# Patient Record
Sex: Male | Born: 1965 | Race: Black or African American | Hispanic: No | Smoking: Former smoker
Health system: Southern US, Community
[De-identification: ages and names within clinical notes are randomized; demographics above are authoritative.]

## PROBLEM LIST (undated history)

## (undated) DIAGNOSIS — R339 Retention of urine, unspecified: Secondary | ICD-10-CM

## (undated) DIAGNOSIS — I10 Essential (primary) hypertension: Secondary | ICD-10-CM

## (undated) DIAGNOSIS — I639 Cerebral infarction, unspecified: Secondary | ICD-10-CM

## (undated) DIAGNOSIS — D75839 Thrombocytosis, unspecified: Secondary | ICD-10-CM

## (undated) HISTORY — DX: Retention of urine, unspecified: R33.9

## (undated) HISTORY — DX: Thrombocytosis, unspecified: D75.839

## (undated) HISTORY — DX: Essential (primary) hypertension: I10

## (undated) NOTE — *Deleted (*Deleted)
Patient on enteric precautions at beginning of shift noted for a fever of 103.1 F and vital signs elevated to point of becoming a yellow mews at 1932, yellow mews protocol followed Charge nurse sandra notified at 1956 Dr. Carlis Abbott notified at 2002 and new orders received to start IV vancomycin and Zosyn. On next vitals sign check at 2113 vital signs elevated to red mews criteria.

---

## 2000-12-29 ENCOUNTER — Emergency Department (HOSPITAL_COMMUNITY): Admission: EM | Admit: 2000-12-29 | Discharge: 2000-12-29 | Payer: Self-pay | Admitting: *Deleted

## 2001-01-01 ENCOUNTER — Encounter (HOSPITAL_COMMUNITY): Admission: RE | Admit: 2001-01-01 | Discharge: 2001-04-01 | Payer: Self-pay | Admitting: *Deleted

## 2001-01-05 ENCOUNTER — Emergency Department (HOSPITAL_COMMUNITY): Admission: EM | Admit: 2001-01-05 | Discharge: 2001-01-05 | Payer: Self-pay | Admitting: Emergency Medicine

## 2001-01-07 ENCOUNTER — Emergency Department (HOSPITAL_COMMUNITY): Admission: EM | Admit: 2001-01-07 | Discharge: 2001-01-07 | Payer: Self-pay | Admitting: Emergency Medicine

## 2001-01-12 ENCOUNTER — Emergency Department (HOSPITAL_COMMUNITY): Admission: EM | Admit: 2001-01-12 | Discharge: 2001-01-12 | Payer: Self-pay | Admitting: Emergency Medicine

## 2001-01-26 ENCOUNTER — Emergency Department (HOSPITAL_COMMUNITY): Admission: EM | Admit: 2001-01-26 | Discharge: 2001-01-26 | Payer: Self-pay | Admitting: Emergency Medicine

## 2012-06-07 ENCOUNTER — Ambulatory Visit: Payer: Self-pay | Admitting: Emergency Medicine

## 2012-06-07 VITALS — BP 159/94 | HR 71 | Temp 98.0°F | Resp 16 | Ht 67.25 in | Wt 199.6 lb

## 2012-06-07 DIAGNOSIS — S91109A Unspecified open wound of unspecified toe(s) without damage to nail, initial encounter: Secondary | ICD-10-CM

## 2012-06-07 DIAGNOSIS — S91119A Laceration without foreign body of unspecified toe without damage to nail, initial encounter: Secondary | ICD-10-CM

## 2012-06-07 MED ORDER — CEPHALEXIN 500 MG PO CAPS: 500.0000 mg | ORAL_CAPSULE | Freq: Four times a day (QID) | ORAL | Status: AC

## 2012-06-07 NOTE — Progress Notes (Signed)
VCO. Digital block with 2% lidocaine plain. Wound washed thoroughly with soap and water. No deep structure involvement, no foreign bodies or debris noted. Dressed and bandaged. Patient tolerated procedure well. He declined post-op shoe at this time.

## 2012-06-07 NOTE — Progress Notes (Signed)
   Nature conservation officer at [facility] [address] [place] [address] Phone: [phone] Fax: [phone]   Patient Name: Zachary Flores Date of Birth: 1966/11/02 Medical Record Number: [id number] Gender: male Date of Encounter: 06/07/2012  History of Present Illness:  Zachary Flores is a 46 y.o. very pleasant male patient who presents with the following:  Stepped on a rake on Wednesday and tines lacerated his right great toe.  Current on TD   There is no problem list on file for this patient.  No past medical history on file. No past surgical history on file. History  Substance Use Topics  . Smoking status: Current Everyday Smoker  . Smokeless tobacco: Not on file  . Alcohol Use: Not on file   No family history on file. No Known Allergies  Medication list has been reviewed and updated.  Prior to Admission medications   Not on File    Review of Systems:  As per HPI, otherwise negative.   Physical Examination: Filed Vitals:   06/07/12 1139  BP: 159/94  Pulse: 71  Temp: 98 F (36.7 C)  Resp: 16   Filed Vitals:   06/07/12 1139  Height: 5' 7.25" (1.708 m)  Weight: 199 lb 9.6 oz (90.538 kg)   Body mass index is 31.03 kg/(m^2). Ideal Body Weight: Weight in (lb) to have BMI = 25: 160.5   GEN: WDWN, NAD, Non-toxic, Alert & Oriented x 3 HEENT: Atraumatic, Normocephalic.  Ears and Nose: No external deformity. EXTR: No clubbing/cyanosis/edema.  Laceration flexor surface right great toe.  No FB  Wound clean.Marland Kitchen NATI NEURO: Normal gait.  PSYCH: Normally interactive. Conversant. Not depressed or anxious appearing.  Calm demeanor.    Assessment and Plan:  Laceration great toe.   Wound care in office included local anesthesia and forceful irrigation.  Plan to repair tomorrow if no evidence infection.   Keflex for prophylaxis  Carmelina Dane, MD

## 2012-06-07 NOTE — Patient Instructions (Signed)

## 2012-06-08 ENCOUNTER — Ambulatory Visit (INDEPENDENT_AMBULATORY_CARE_PROVIDER_SITE_OTHER): Payer: Self-pay | Admitting: Physician Assistant

## 2012-06-08 VITALS — BP 143/102 | HR 67 | Temp 98.6°F | Resp 16 | Ht 67.0 in | Wt 199.0 lb

## 2012-06-08 DIAGNOSIS — S91309A Unspecified open wound, unspecified foot, initial encounter: Secondary | ICD-10-CM

## 2012-06-08 DIAGNOSIS — M79609 Pain in unspecified limb: Secondary | ICD-10-CM

## 2012-06-08 DIAGNOSIS — M79673 Pain in unspecified foot: Secondary | ICD-10-CM

## 2012-06-08 DIAGNOSIS — B353 Tinea pedis: Secondary | ICD-10-CM

## 2012-06-08 MED ORDER — KETOCONAZOLE 2 % EX CREA: TOPICAL_CREAM | Freq: Every day | CUTANEOUS | Status: AC

## 2012-06-08 NOTE — Progress Notes (Signed)
  Subjective:    Patient ID: Zachary Flores, male    DOB: [birth year], 46 y.o.   MRN: [id number]  HPI Patient presents for follow up on right great toe wound. DOI 06/05/12 after stepping on a rake. The rake went through his shoe and lacerated the plantar aspect of his great toe. He presented initially for evaluation 6/14 and his wound was digitally blocked, irrigated, and washed with soap and water. He is here today for recheck. He has kept it clean and bandaged. Washed with soap and water this a.m. No purulent drainage, warmth, fever, chills, or tenderness. In fact, he says the pain has improved today.  Patient also complains of persistent, pruritic rash on the plantar surface of his right foot.   Review of Systems  All other systems reviewed and are negative.       Objective:   Physical Exam  Constitutional: He is oriented to person, place, and time. He appears well-developed and well-nourished.  HENT:  Head: Normocephalic and atraumatic.  Right Ear: External ear normal.  Left Ear: External ear normal.  Eyes: Conjunctivae are normal.  Neck: Normal range of motion.  Cardiovascular: Normal rate, regular rhythm and normal heart sounds.   Pulmonary/Chest: Effort normal and breath sounds normal.  Neurological: He is alert and oriented to person, place, and time.  Skin: Rash (scaly rash on plantar surface with yellowing and thickening of nails) noted.       Open wound of plantar aspect of right great toe. No purulent drainage, warmth, or tenderness.     Psychiatric: He has a normal mood and affect. His behavior is normal. Judgment and thought content normal.    Two steri strips were applied to the lateral sides of the wound to help approximate the edges.       Assessment & Plan:   1. Wound, open, foot  Continue Keflex as directed. Wound appears to be healing well. Wound care provided. Recommend he continue daily cleaning and keeping a bulky bandage at all times.    2. Tinea pedis   ketoconazole (NIZORAL) 2 % cream  3. Pain in foot

## 2019-06-30 ENCOUNTER — Other Ambulatory Visit: Payer: Self-pay

## 2019-06-30 ENCOUNTER — Emergency Department (HOSPITAL_COMMUNITY): Payer: Self-pay

## 2019-06-30 ENCOUNTER — Encounter (HOSPITAL_COMMUNITY): Payer: Self-pay | Admitting: Emergency Medicine

## 2019-06-30 ENCOUNTER — Observation Stay (HOSPITAL_COMMUNITY)
Admission: EM | Admit: 2019-06-30 | Discharge: 2019-07-01 | Disposition: A | Discharge status: Home / Self Care | Payer: Self-pay | Attending: Internal Medicine | Admitting: Internal Medicine

## 2019-06-30 DIAGNOSIS — F10129 Alcohol abuse with intoxication, unspecified: Secondary | ICD-10-CM | POA: Insufficient documentation

## 2019-06-30 DIAGNOSIS — Z03818 Encounter for observation for suspected exposure to other biological agents ruled out: Secondary | ICD-10-CM | POA: Insufficient documentation

## 2019-06-30 DIAGNOSIS — R740 Nonspecific elevation of levels of transaminase and lactic acid dehydrogenase [LDH]: Secondary | ICD-10-CM | POA: Insufficient documentation

## 2019-06-30 DIAGNOSIS — E876 Hypokalemia: Secondary | ICD-10-CM | POA: Insufficient documentation

## 2019-06-30 DIAGNOSIS — R7989 Other specified abnormal findings of blood chemistry: Secondary | ICD-10-CM

## 2019-06-30 DIAGNOSIS — F10929 Alcohol use, unspecified with intoxication, unspecified: Secondary | ICD-10-CM

## 2019-06-30 DIAGNOSIS — M25569 Pain in unspecified knee: Secondary | ICD-10-CM

## 2019-06-30 DIAGNOSIS — I10 Essential (primary) hypertension: Secondary | ICD-10-CM | POA: Insufficient documentation

## 2019-06-30 DIAGNOSIS — Z87898 Personal history of other specified conditions: Secondary | ICD-10-CM | POA: Diagnosis present

## 2019-06-30 DIAGNOSIS — G459 Transient cerebral ischemic attack, unspecified: Secondary | ICD-10-CM | POA: Insufficient documentation

## 2019-06-30 DIAGNOSIS — F17200 Nicotine dependence, unspecified, uncomplicated: Secondary | ICD-10-CM | POA: Insufficient documentation

## 2019-06-30 DIAGNOSIS — W010XXA Fall on same level from slipping, tripping and stumbling without subsequent striking against object, initial encounter: Secondary | ICD-10-CM | POA: Insufficient documentation

## 2019-06-30 DIAGNOSIS — R531 Weakness: Principal | ICD-10-CM | POA: Insufficient documentation

## 2019-06-30 DIAGNOSIS — R945 Abnormal results of liver function studies: Secondary | ICD-10-CM

## 2019-06-30 HISTORY — DX: Other specified abnormal findings of blood chemistry: R79.89

## 2019-06-30 LAB — APTT: aPTT: 26 seconds (ref 24–36)

## 2019-06-30 LAB — COMPREHENSIVE METABOLIC PANEL
ALT: 48 U/L — ABNORMAL HIGH (ref 0–44)
AST: 87 U/L — ABNORMAL HIGH (ref 15–41)
Albumin: 4.4 g/dL (ref 3.5–5.0)
Alkaline Phosphatase: 79 U/L (ref 38–126)
Anion gap: 14 (ref 5–15)
BUN: 5 mg/dL — ABNORMAL LOW (ref 6–20)
CO2: 20 mmol/L — ABNORMAL LOW (ref 22–32)
Calcium: 8.7 mg/dL — ABNORMAL LOW (ref 8.9–10.3)
Chloride: 99 mmol/L (ref 98–111)
Creatinine, Ser: 0.92 mg/dL (ref 0.61–1.24)
GFR calc Af Amer: >60 mL/min (ref >60)
GFR calc non Af Amer: >60 mL/min (ref >60)
Glucose, Bld: 122 mg/dL — ABNORMAL HIGH (ref 70–99)
Potassium: 3.4 mmol/L — ABNORMAL LOW (ref 3.5–5.1)
Sodium: 133 mmol/L — ABNORMAL LOW (ref 135–145)
Total Bilirubin: 0.5 mg/dL (ref 0.3–1.2)
Total Protein: 7.1 g/dL (ref 6.5–8.1)

## 2019-06-30 LAB — CBC
HCT: 44.8 % (ref 39.0–52.0)
Hemoglobin: 14.8 g/dL (ref 13.0–17.0)
MCH: 29.7 pg (ref 26.0–34.0)
MCHC: 33 g/dL (ref 30.0–36.0)
MCV: 89.8 fL (ref 80.0–100.0)
Platelets: 313 10*3/uL (ref 150–400)
RBC: 4.99 MIL/uL (ref 4.22–5.81)
RDW: 13.9 % (ref 11.5–15.5)
WBC: 5.7 10*3/uL (ref 4.0–10.5)
nRBC: 0 % (ref 0.0–0.2)

## 2019-06-30 LAB — DIFFERENTIAL
Abs Immature Granulocytes: 0.01 10*3/uL (ref 0.00–0.07)
Basophils Absolute: 0 10*3/uL (ref 0.0–0.1)
Basophils Relative: 1 %
Eosinophils Absolute: 0 10*3/uL (ref 0.0–0.5)
Eosinophils Relative: 1 %
Immature Granulocytes: 0 %
Lymphocytes Relative: 24 %
Lymphs Abs: 1.4 10*3/uL (ref 0.7–4.0)
Monocytes Absolute: 0.5 10*3/uL (ref 0.1–1.0)
Monocytes Relative: 9 %
Neutro Abs: 3.8 10*3/uL (ref 1.7–7.7)
Neutrophils Relative %: 65 %

## 2019-06-30 LAB — I-STAT CHEM 8, ED
BUN: 4 mg/dL — ABNORMAL LOW (ref 6–20)
Calcium, Ion: 0.96 mmol/L — ABNORMAL LOW (ref 1.15–1.40)
Chloride: 101 mmol/L (ref 98–111)
Creatinine, Ser: 1.4 mg/dL — ABNORMAL HIGH (ref 0.61–1.24)
Glucose, Bld: 120 mg/dL — ABNORMAL HIGH (ref 70–99)
HCT: 49 % (ref 39.0–52.0)
Hemoglobin: 16.7 g/dL (ref 13.0–17.0)
Potassium: 3.5 mmol/L (ref 3.5–5.1)
Sodium: 135 mmol/L (ref 135–145)
TCO2: 21 mmol/L — ABNORMAL LOW (ref 22–32)

## 2019-06-30 LAB — ETHANOL: Alcohol, Ethyl (B): 281 mg/dL — ABNORMAL HIGH (ref <10)

## 2019-06-30 LAB — PROTIME-INR
INR: 0.9 (ref 0.8–1.2)
Prothrombin Time: 12.5 seconds (ref 11.4–15.2)

## 2019-06-30 LAB — SARS CORONAVIRUS 2 BY RT PCR (HOSPITAL ORDER, PERFORMED IN ~~LOC~~ HOSPITAL LAB): SARS Coronavirus 2: NEGATIVE

## 2019-06-30 MED ORDER — LORAZEPAM 1 MG PO TABS: 1.0000 mg | ORAL_TABLET | Freq: Four times a day (QID) | ORAL | Status: DC | PRN

## 2019-06-30 MED ORDER — SODIUM CHLORIDE 0.9% FLUSH
3.0000 mL | Freq: Once | INTRAVENOUS | Status: AC
  Administered 2019-06-30: 3 mL via INTRAVENOUS

## 2019-06-30 MED ORDER — THIAMINE HCL 100 MG/ML IJ SOLN: 100.0000 mg | Freq: Every day | INTRAMUSCULAR | Status: DC

## 2019-06-30 MED ORDER — POTASSIUM CHLORIDE 10 MEQ/100ML IV SOLN
10.0000 meq | Freq: Once | INTRAVENOUS | Status: AC
  Administered 2019-06-30: 10 meq via INTRAVENOUS
  Filled 2019-06-30: qty 100

## 2019-06-30 MED ORDER — LORAZEPAM 2 MG/ML IJ SOLN: 1.0000 mg | Freq: Four times a day (QID) | INTRAMUSCULAR | Status: DC | PRN

## 2019-06-30 MED ORDER — FOLIC ACID 1 MG PO TABS
1.0000 mg | ORAL_TABLET | Freq: Every day | ORAL | Status: DC
  Administered 2019-07-01: 1 mg via ORAL
  Filled 2019-06-30: qty 1

## 2019-06-30 MED ORDER — IOHEXOL 350 MG/ML SOLN
75.0000 mL | Freq: Once | INTRAVENOUS | Status: AC | PRN
  Administered 2019-06-30: 75 mL via INTRAVENOUS

## 2019-06-30 MED ORDER — VITAMIN B-1 100 MG PO TABS
100.0000 mg | ORAL_TABLET | Freq: Every day | ORAL | Status: DC
  Administered 2019-07-01: 100 mg via ORAL
  Filled 2019-06-30: qty 1

## 2019-06-30 MED ORDER — ADULT MULTIVITAMIN W/MINERALS CH
1.0000 | ORAL_TABLET | Freq: Every day | ORAL | Status: DC
  Administered 2019-07-01: 1 via ORAL
  Filled 2019-06-30: qty 1

## 2019-06-30 MED ORDER — SODIUM CHLORIDE 0.9 % IV SOLN
INTRAVENOUS | Status: DC
  Administered 2019-06-30: via INTRAVENOUS

## 2019-06-30 NOTE — ED Notes (Signed)
Pt repeatedly trying to get out of bed by self. Pt weak in legs and repeatedly asked to stay in bed and not get up by self. RN moving pt closer to [employer]. Safety sitter to be ordered. Yellow socks and wrist band placed.

## 2019-06-30 NOTE — ED Notes (Addendum)
Pt pulled out one IV says that he's leaving RN again educating.

## 2019-06-30 NOTE — Consult Note (Signed)
Requesting Physician: Dr. Tomi Bamberger    Chief Complaint:   History obtained from: Patient and Chart   HPI:                                                                                                                                       Zachary Flores is a 53 y.o. male with  past medical history of tobacco use, possible alcohol abuse presents to the ED as a stroke alert for left-sided weakness after a fall.  Patient started drinking alcohol around 11 AM this afternoon.  Around 7 PM in the evening patient had walked out of the house and fallen.  Neighbors called EMS and patient was found intoxicated and  was taken back into the house.  Assessment at that time did not reveal any facial droop or focal motor deficits according to the EMS.  Around 7:30 PM EMS was called again by his neighbors as patient had fallen on the floor again.  This time EMS noted his left leg was significantly weak and patient was not able to lift it against gravity.  He also had a left facial droop and left arm drift.  On arrival, patient scored a NIH stroke scale of 5.  However he was continuing to improve.  A stat CT head was obtained which showed no hemorrhage.  Following CT scan, NIH stroke scale further reduced to 4 now scoring only a drift in the left leg.    Since last known normal was unclear given patient was intoxicated, and it is possible that he may have mild deficits a stat MRI brain was obtained to assess for diffusion/flair mismatch.  MRI was negative for acute infarct, repeat assessment after completing MRI brain was negative for facial droop, arm and leg drift.  Date last known well: 7.6.2020 Time last known well: ?7pm vs 11am tPA Given: no, symptoms resolving NIHSS: 5 on arrival Baseline MRS 0  Past medical history As stated above  Family history No significant stroke history at young age  Social History:  reports that he has been smoking. He does not have any smokeless tobacco history on file. No  history on file for alcohol and drug.  Allergies: No Known Allergies  Medications:  I reviewed home medications   ROS:                                                                                                                                     14 systems reviewed and negative except above    Examination:                                                                                                      General: Appears well-developed  Psych: Affect appropriate to situation Eyes: No scleral injection HENT: No OP obstrucion Head: Normocephalic.  Cardiovascular: Normal rate and regular rhythm. Respiratory: Effort normal and breath sounds normal to anterior ascultation GI: Soft.  No distension. There is no tenderness.  Skin: WDI    Neurological Examination ( on arival) Mental Status: Alert, oriented, thought content appropriate.  Speech fluent without evidence of aphasia.  Speech is slurred.  Able to follow 3 step commands without difficulty. Cranial Nerves: II: Visual fields grossly normal,  III,IV, VI: ptosis not present, extra-ocular motions intact bilaterally, pupils equal, round, reactive to light and accommodation V,VII: Normal sensation over face, mild left nasolabial fold flattening VIII: hearing normal bilaterally IX,X: uvula rises symmetrically XI: bilateral shoulder shrug XII: midline tongue extension Motor: Right : Upper extremity   5/5    Left:     Upper extremity   4/5  Lower extremity   5/5     Lower extremity   3/5 Tone and bulk:normal tone throughout; no atrophy noted Sensory: Pinprick and light touch intact throughout, bilaterally Deep Tendon Reflexes: 2+ and symmetric throughout Plantars: Right: downgoing   Left: downgoing Cerebellar: No significant ataxia out of proportion to weakness Gait: not assessed due to safety     Lab  Results: Basic Metabolic Panel: Recent Labs  Lab 06/30/19 2028 06/30/19 2035  NA 133* 135  K 3.4* 3.5  CL 99 101  CO2 20*  --   GLUCOSE 122* 120*  BUN 5* 4*  CREATININE 0.92 1.40*  CALCIUM 8.7*  --     CBC: Recent Labs  Lab 06/30/19 2028 06/30/19 2035  WBC 5.7  --   NEUTROABS 3.8  --   HGB 14.8 16.7  HCT 44.8 49.0  MCV 89.8  --   PLT 313  --     Coagulation Studies: Recent Labs    06/30/19 2028  LABPROT 12.5  INR 0.9    Imaging: Ct Angio Head W Or Wo Contrast  Result Date: 06/30/2019 CLINICAL DATA:  Stroke syndrome EXAM: CT ANGIOGRAPHY HEAD AND NECK TECHNIQUE: Multidetector CT imaging of the head and neck was performed using the standard protocol during bolus administration of intravenous contrast. Multiplanar CT image reconstructions and MIPs were obtained to evaluate the vascular anatomy. Carotid stenosis measurements (when applicable) are obtained utilizing NASCET criteria, using the distal internal carotid diameter as the denominator. CONTRAST:  75 cc Omnipaque 350 intravenous COMPARISON:  Noncontrast head CT earlier today FINDINGS: CTA NECK FINDINGS Aortic arch: Negative Right carotid system: Vessels are smooth and widely patent. No atheromatous changes Left carotid system: Mild mainly calcified plaque about the bifurcation without stenosis or ulceration. Vertebral arteries: Left dominant vertebral artery. Both vertebral arteries are smooth and diffusely patent. No proximal subclavian stenosis. Skeleton: No acute or aggressive finding.  Dental caries Other neck: Negative Upper chest: Remote injury to the left pectoralis major with partially visualized coarse calcification. Review of the MIP images confirms the above findings CTA HEAD FINDINGS Anterior circulation: Mild calcification of the carotid siphons. No flow limiting stenosis or major branch occlusion. Negative for aneurysm Posterior circulation: Left dominant vertebral artery. The vertebral and basilar arteries are  smooth and widely patent. Hypoplastic P1 segments Venous sinuses: Patent Anatomic variants: As above Delayed phase: Not obtained Review of the MIP images confirms the above findings IMPRESSION: 1. Negative for large vessel occlusion or other emergent finding. 2. Mild atherosclerosis without significant stenosis. Electronically Signed   By: Marnee SpringJonathon  Watts M.D.   On: 06/30/2019 21:02   Ct Angio Neck W Or Wo Contrast  Result Date: 06/30/2019 CLINICAL DATA:  Stroke syndrome EXAM: CT ANGIOGRAPHY HEAD AND NECK TECHNIQUE: Multidetector CT imaging of the head and neck was performed using the standard protocol during bolus administration of intravenous contrast. Multiplanar CT image reconstructions and MIPs were obtained to evaluate the vascular anatomy. Carotid stenosis measurements (when applicable) are obtained utilizing NASCET criteria, using the distal internal carotid diameter as the denominator. CONTRAST:  75 cc Omnipaque 350 intravenous COMPARISON:  Noncontrast head CT earlier today FINDINGS: CTA NECK FINDINGS Aortic arch: Negative Right carotid system: Vessels are smooth and widely patent. No atheromatous changes Left carotid system: Mild mainly calcified plaque about the bifurcation without stenosis or ulceration. Vertebral arteries: Left dominant vertebral artery. Both vertebral arteries are smooth and diffusely patent. No proximal subclavian stenosis. Skeleton: No acute or aggressive finding.  Dental caries Other neck: Negative Upper chest: Remote injury to the left pectoralis major with partially visualized coarse calcification. Review of the MIP images confirms the above findings CTA HEAD FINDINGS Anterior circulation: Mild calcification of the carotid siphons. No flow limiting stenosis or major branch occlusion. Negative for aneurysm Posterior circulation: Left dominant vertebral artery. The vertebral and basilar arteries are smooth and widely patent. Hypoplastic P1 segments Venous sinuses: Patent Anatomic  variants: As above Delayed phase: Not obtained Review of the MIP images confirms the above findings IMPRESSION: 1. Negative for large vessel occlusion or other emergent finding. 2. Mild atherosclerosis without significant stenosis. Electronically Signed   By: Marnee SpringJonathon  Watts M.D.   On: 06/30/2019 21:02   Mr Brain Wo Contrast  Result Date: 06/30/2019 CLINICAL DATA:  Initial evaluation for acute stroke. EXAM: MRI HEAD WITHOUT CONTRAST TECHNIQUE: Multiplanar, multiecho pulse sequences of the brain and surrounding structures were obtained without intravenous contrast. COMPARISON:  Prior CTA from earlier same day. FINDINGS: Brain: Limited exam with DWI, gradient echo imaging, and FLAIR sequence only was performed. Images are degraded by motion artifact. Diffusion-weighted imaging demonstrates no evidence for acute or  subacute ischemia. Gray-white matter differentiation maintained. No evidence for acute intracranial hemorrhage. Few scattered chronic micro hemorrhages noted involving the brainstem. Underlying age-related cerebral atrophy. Confluent T2/FLAIR hyperintensity within the periventricular and deep white matter both cerebral hemispheres most consistent with chronic microvascular ischemic disease, advanced for age. No mass lesion or midline shift. No hydrocephalus. No extra-axial fluid collection. Vascular: Not well assessed on this limited examination. Skull and upper cervical spine: Not well assessed on this limited examination. Sinuses/Orbits: Grossly negative, although not well assessed on this limited exam. Other: None. IMPRESSION: 1. Negative brain MRI.  No acute intracranial infarct identified. 2. 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 Electronically Signed   By: Rise MuBenjamin  McClintock M.D.   On: 06/30/2019 21:27   Ct Head Code Stroke Wo Contrast  Result Date: 06/30/2019 CLINICAL DATA:  Code stroke.  Left facial droop and weakness. EXAM: CT HEAD WITHOUT CONTRAST TECHNIQUE: Contiguous  axial images were obtained from the base of the skull through the vertex without intravenous contrast. COMPARISON:  None. FINDINGS: Brain: No evidence of acute infarction, hemorrhage, hydrocephalus, extra-axial collection or mass lesion/mass effect. Extensive chronic small vessel ischemic gliosis for age in the white matter. Remote appearing lacunar infarct at the upper right putamen and internal capsule. Vascular: No hyperdense vessel or unexpected calcification. Skull: Normal. Negative for fracture or focal lesion. Sinuses/Orbits: No acute finding. Other: These results were communicated to Dr. Laurence SlateAroor at 8:45 pmon 7/6/2020by text page via the [facility] messaging system. ASPECTS [facility]([place] Stroke Program Early CT Score) - Ganglionic level infarction (caudate, lentiform nuclei, internal capsule, insula, M1-M3 cortex): 7 - Supraganglionic infarction (M4-M6 cortex): 3 Total score (0-10 with 10 being normal): 10 IMPRESSION: 1. Negative for intracranial hemorrhage or visible acute infarct. 2. Chronic small vessel ischemia greater than expected for age. Electronically Signed   By: Marnee SpringJonathon  Watts M.D.   On: 06/30/2019 20:47     ASSESSMENT AND PLAN  53 year old male with past medical history significant for tobacco use and possible alcohol abuse presents with transient left-sided weakness after fall.  MRI negative for acute stroke.  CTA negative for large vessel occlusion.  Patient likely had a transient ischemic attack.  Transient ischemic attack  Recommend #Transthoracic Echo  # Start patient on ASA 325mg  daily #Start or continue Atorvastatin 80 mg/other high intensity statin # BP goal: permissive HTN upto 220/120 mmHg ( 185/110 if patient has CHF, CKD) # HBAIC and Lipid profile # Telemetry monitoring # Frequent neuro checks # NPO until passes stroke swallow screen  Please page stroke NP  Or  PA  Or MD from 8am -4 pm  as this patient from this time will be  followed by the stroke.   You can look them up on  [email or web address]  Password [email or web address]   Kade Demicco [facility] Pager Number [phone]

## 2019-06-30 NOTE — ED Notes (Signed)
EMT sitting with pt. Pt resting with eyes closed.

## 2019-06-30 NOTE — ED Notes (Signed)
Pt getting out of bed again by self and cannot hold self up. Pt found on floor. RN called staffing for sitter already. None available.

## 2019-06-30 NOTE — ED Notes (Signed)
Pt being uncooperative about staying in bed. RN attempted to place condom catheter to decrease movement . Pt refused. Pt continually trying to get out of bed to use bathroom. RN continually educating.

## 2019-06-30 NOTE — ED Triage Notes (Signed)
Pt presents to ED from home by [facility]. LKW 1915. Pt seen by neighbor falling. EMS noted L weakness, droop.

## 2019-06-30 NOTE — ED Notes (Addendum)
RN attempted calling report. Told needed to call back

## 2019-06-30 NOTE — ED Notes (Signed)
ED TO INPATIENT HANDOFF REPORT  ED Nurse Name and Phone #:  [phone] Shawna OrleansMelanie, RN  S Name/Age/Gender Zachary Flores 53 y.o. male Room/Bed: 034C/034C  Code Status   Code Status: Not on file  Home/SNF/Other Home Patient oriented to: self, place, time and situation Is this baseline? Yes   Triage Complete: Triage complete  Chief Complaint CODE STROKE   Triage Note Pt presents to ED from home by [facility]. LKW 1915. Pt seen by neighbor falling. EMS noted L weakness, droop.    Allergies No Known Allergies  Level of Care/Admitting Diagnosis ED Disposition    ED Disposition Condition Comment   Admit  [facility] [[id number]]  Level of Care: Telemetry Medical [104]  I expect the patient will be discharged within 24 hours: No (not a candidate for 5C-Observation unit)  Covid Evaluation: Person Under Investigation (PUI)  Diagnosis: TIA (transient ischemic attack) [[id number]]) [167614]  Admitting Physician: Eduard ClosKAKRAKANDY, ARSHAD N [phone][[id number]]  Attending Physician: Eduard ClosKAKRAKANDY, ARSHAD N [[id number]]  PT Class (Do Not Modify): Observation [104]  PT Acc Code (Do Not Modify): Observation [10022]       B Medical/Surgery History History reviewed. No pertinent past medical history. History reviewed. No pertinent surgical history.   A IV Location/Drains/Wounds Patient Lines/Drains/Airways Status   Active Line/Drains/Airways    Name:   Placement date:   Placement time:   Site:   Days:   Peripheral IV 06/30/19 Anterior;Distal;Right;Upper Arm   06/30/19    2027    Arm   less than 1          Intake/Output Last 24 hours  Intake/Output Summary (Last 24 hours) at 06/30/2019 2345 Last data filed at 06/30/2019 2100 Gross per 24 hour  Intake -  Output 250 ml  Net -250 ml    Labs/Imaging Results for orders placed or performed during the hospital encounter of 06/30/19 (from the past 48 hour(s))  Protime-INR     Status: None   Collection Time: 06/30/19  8:28 PM  Result Value Ref Range    Prothrombin Time 12.5 11.4 - 15.2 seconds   INR 0.9 0.8 - 1.2    Comment: (NOTE) INR goal varies based on device and disease states. Performed at [facility], [address] [place], [place] [address]   APTT     Status: None   Collection Time: 06/30/19  8:28 PM  Result Value Ref Range   aPTT 26 24 - 36 seconds    Comment: Performed at [facility], [address] [place], [place] [address]  CBC     Status: None   Collection Time: 06/30/19  8:28 PM  Result Value Ref Range   WBC 5.7 4.0 - 10.5 K/uL   RBC 4.99 4.22 - 5.81 MIL/uL   Hemoglobin 14.8 13.0 - 17.0 g/dL   HCT 08.644.8 57.839.0 - 46.952.0 %   MCV 89.8 80.0 - 100.0 fL   MCH 29.7 26.0 - 34.0 pg   MCHC 33.0 30.0 - 36.0 g/dL   RDW 62.913.9 52.811.5 - 41.315.5 %   Platelets 313 150 - 400 K/uL   nRBC 0.0 0.0 - 0.2 %    Comment: Performed at [facility], [address] [place], [place] [address]  Differential     Status: None   Collection Time: 06/30/19  8:28 PM  Result Value Ref Range   Neutrophils Relative % 65 %   Neutro Abs 3.8 1.7 - 7.7 K/uL   Lymphocytes Relative 24 %  Lymphs Abs 1.4 0.7 - 4.0 K/uL   Monocytes Relative 9 %   Monocytes Absolute 0.5 0.1 - 1.0 K/uL   Eosinophils Relative 1 %   Eosinophils Absolute 0.0 0.0 - 0.5 K/uL   Basophils Relative 1 %   Basophils Absolute 0.0 0.0 - 0.1 K/uL   Immature Granulocytes 0 %   Abs Immature Granulocytes 0.01 0.00 - 0.07 K/uL    Comment: Performed at [facility], [address] [place], [place] [address]  Comprehensive metabolic panel     Status: Abnormal   Collection Time: 06/30/19  8:28 PM  Result Value Ref Range   Sodium 133 (L) 135 - 145 mmol/L   Potassium 3.4 (L) 3.5 - 5.1 mmol/L   Chloride 99 98 - 111 mmol/L   CO2 20 (L) 22 - 32 mmol/L   Glucose, Bld 122 (H) 70 - 99 mg/dL   BUN 5 (L) 6 - 20 mg/dL   Creatinine, Ser 9.81 0.61 - 1.24 mg/dL   Calcium 8.7 (L) 8.9 - 10.3 mg/dL   Total Protein 7.1 6.5 - 8.1 g/dL   Albumin 4.4 3.5 - 5.0 g/dL   AST 87 (H)  15 - 41 U/L   ALT 48 (H) 0 - 44 U/L   Alkaline Phosphatase 79 38 - 126 U/L   Total Bilirubin 0.5 0.3 - 1.2 mg/dL   GFR calc non Af Amer >60 >60 mL/min   GFR calc Af Amer >60 >60 mL/min   Anion gap 14 5 - 15    Comment: Performed at [facility], [address], [place], [place] [address]  I-stat chem 8, ED     Status: Abnormal   Collection Time: 06/30/19  8:35 PM  Result Value Ref Range   Sodium 135 135 - 145 mmol/L   Potassium 3.5 3.5 - 5.1 mmol/L   Chloride 101 98 - 111 mmol/L   BUN 4 (L) 6 - 20 mg/dL   Creatinine, Ser 8.29 (H) 0.61 - 1.24 mg/dL   Glucose, Bld 562 (H) 70 - 99 mg/dL   Calcium, Ion 1.30 (L) 1.15 - 1.40 mmol/L   TCO2 21 (L) 22 - 32 mmol/L   Hemoglobin 16.7 13.0 - 17.0 g/dL   HCT 86.5 78.4 - 69.6 %  SARS Coronavirus 2 (CEPHEID - Performed in [facility] hospital lab), Hosp Order     Status: None   Collection Time: 06/30/19  9:42 PM   Specimen: Nasopharyngeal Swab  Result Value Ref Range   SARS Coronavirus 2 NEGATIVE NEGATIVE    Comment: (NOTE) If result is NEGATIVE SARS-CoV-2 target nucleic acids are NOT DETECTED. The SARS-CoV-2 RNA is generally detectable in upper and lower  respiratory specimens during the acute phase of infection. The lowest  concentration of SARS-CoV-2 viral copies this assay can detect is 250  copies / mL. A negative result does not preclude SARS-CoV-2 infection  and should not be used as the sole basis for treatment or other  patient management decisions.  A negative result may occur with  improper specimen collection / handling, submission of specimen other  than nasopharyngeal swab, presence of viral mutation(s) within the  areas targeted by this assay, and inadequate number of viral copies  (<250 copies / mL). A negative result must be combined with clinical  observations, patient history, and epidemiological information. If result is POSITIVE SARS-CoV-2 target nucleic acids are DETECTED. The SARS-CoV-2 RNA is generally  detectable in upper and lower  respiratory specimens dur ing the  acute phase of infection.  Positive  results are indicative of active infection with SARS-CoV-2.  Clinical  correlation with patient history and other diagnostic information is  necessary to determine patient infection status.  Positive results do  not rule out bacterial infection or co-infection with other viruses. If result is PRESUMPTIVE POSTIVE SARS-CoV-2 nucleic acids MAY BE PRESENT.   A presumptive positive result was obtained on the submitted specimen  and confirmed on repeat testing.  While 2019 novel coronavirus  (SARS-CoV-2) nucleic acids may be present in the submitted sample  additional confirmatory testing may be necessary for epidemiological  and / or clinical management purposes  to differentiate between  SARS-CoV-2 and other Sarbecovirus currently known to infect humans.  If clinically indicated additional testing with an alternate test  methodology [phone]) is advised. The SARS-CoV-2 RNA is generally  detectable in upper and lower respiratory sp ecimens during the acute  phase of infection. The expected result is Negative. Fact Sheet for Patients:  BoilerBrush.com.cy Fact Sheet for Healthcare Providers: https://pope.com/ This test is not yet approved or cleared by the [place] FDA and has been authorized for detection and/or diagnosis of SARS-CoV-2 by FDA under an Emergency Use Authorization (EUA).  This EUA will remain in effect (meaning this test can be used) for the duration of the COVID-19 declaration under Section 564(b)(1) of the Act, 21 U.S.C. section 360bbb-3(b)(1), unless the authorization is terminated or revoked sooner. Performed at [facility], [address], [place], [place] [address]   Ethanol     Status: Abnormal   Collection Time: 06/30/19  9:56 PM  Result Value Ref Range   Alcohol, Ethyl (B) 281 (H) <10 mg/dL    Comment:  (NOTE) Lowest detectable limit for serum alcohol is 10 mg/dL. For medical purposes only. Performed at [facility], [name], [place], [place] [address]    Ct Angio Head W Or Wo Contrast  Result Date: 06/30/2019 CLINICAL DATA:  Stroke syndrome EXAM: CT ANGIOGRAPHY HEAD AND NECK TECHNIQUE: Multidetector CT imaging of the head and neck was performed using the standard protocol during bolus administration of intravenous contrast. Multiplanar CT image reconstructions and MIPs were obtained to evaluate the vascular anatomy. Carotid stenosis measurements (when applicable) are obtained utilizing NASCET criteria, using the distal internal carotid diameter as the denominator. CONTRAST:  75 cc Omnipaque 350 intravenous COMPARISON:  Noncontrast head CT earlier today FINDINGS: CTA NECK FINDINGS Aortic arch: Negative Right carotid system: Vessels are smooth and widely patent. No atheromatous changes Left carotid system: Mild mainly calcified plaque about the bifurcation without stenosis or ulceration. Vertebral arteries: Left dominant vertebral artery. Both vertebral arteries are smooth and diffusely patent. No proximal subclavian stenosis. Skeleton: No acute or aggressive finding.  Dental caries Other neck: Negative Upper chest: Remote injury to the left pectoralis major with partially visualized coarse calcification. Review of the MIP images confirms the above findings CTA HEAD FINDINGS Anterior circulation: Mild calcification of the carotid siphons. No flow limiting stenosis or major branch occlusion. Negative for aneurysm Posterior circulation: Left dominant vertebral artery. The vertebral and basilar arteries are smooth and widely patent. Hypoplastic P1 segments Venous sinuses: Patent Anatomic variants: As above Delayed phase: Not obtained Review of the MIP images confirms the above findings IMPRESSION: 1. Negative for large vessel occlusion or other emergent finding. 2. Mild atherosclerosis without  significant stenosis. Electronically Signed   By: Marnee Spring M.D.   On: 06/30/2019 21:02   Ct Angio Neck W Or  Wo Contrast  Result Date: 06/30/2019 CLINICAL DATA:  Stroke syndrome EXAM: CT ANGIOGRAPHY HEAD AND NECK TECHNIQUE: Multidetector CT imaging of the head and neck was performed using the standard protocol during bolus administration of intravenous contrast. Multiplanar CT image reconstructions and MIPs were obtained to evaluate the vascular anatomy. Carotid stenosis measurements (when applicable) are obtained utilizing NASCET criteria, using the distal internal carotid diameter as the denominator. CONTRAST:  75 cc Omnipaque 350 intravenous COMPARISON:  Noncontrast head CT earlier today FINDINGS: CTA NECK FINDINGS Aortic arch: Negative Right carotid system: Vessels are smooth and widely patent. No atheromatous changes Left carotid system: Mild mainly calcified plaque about the bifurcation without stenosis or ulceration. Vertebral arteries: Left dominant vertebral artery. Both vertebral arteries are smooth and diffusely patent. No proximal subclavian stenosis. Skeleton: No acute or aggressive finding.  Dental caries Other neck: Negative Upper chest: Remote injury to the left pectoralis major with partially visualized coarse calcification. Review of the MIP images confirms the above findings CTA HEAD FINDINGS Anterior circulation: Mild calcification of the carotid siphons. No flow limiting stenosis or major branch occlusion. Negative for aneurysm Posterior circulation: Left dominant vertebral artery. The vertebral and basilar arteries are smooth and widely patent. Hypoplastic P1 segments Venous sinuses: Patent Anatomic variants: As above Delayed phase: Not obtained Review of the MIP images confirms the above findings IMPRESSION: 1. Negative for large vessel occlusion or other emergent finding. 2. Mild atherosclerosis without significant stenosis. Electronically Signed   By: Marnee SpringJonathon  Watts M.D.   On:  06/30/2019 21:02   Mr Brain Wo Contrast  Result Date: 06/30/2019 CLINICAL DATA:  Initial evaluation for acute stroke. EXAM: MRI HEAD WITHOUT CONTRAST TECHNIQUE: Multiplanar, multiecho pulse sequences of the brain and surrounding structures were obtained without intravenous contrast. COMPARISON:  Prior CTA from earlier same day. FINDINGS: Brain: Limited exam with DWI, gradient echo imaging, and FLAIR sequence only was performed. Images are degraded by motion artifact. Diffusion-weighted imaging demonstrates no evidence for acute or subacute ischemia. Gray-white matter differentiation maintained. No evidence for acute intracranial hemorrhage. Few scattered chronic micro hemorrhages noted involving the brainstem. Underlying age-related cerebral atrophy. Confluent T2/FLAIR hyperintensity within the periventricular and deep white matter both cerebral hemispheres most consistent with chronic microvascular ischemic disease, advanced for age. No mass lesion or midline shift. No hydrocephalus. No extra-axial fluid collection. Vascular: Not well assessed on this limited examination. Skull and upper cervical spine: Not well assessed on this limited examination. Sinuses/Orbits: Grossly negative, although not well assessed on this limited exam. Other: None. IMPRESSION: 1. Negative brain MRI.  No acute intracranial infarct identified. 2. 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 Electronically Signed   By: Rise MuBenjamin  McClintock M.D.   On: 06/30/2019 21:27   Ct Head Code Stroke Wo Contrast  Result Date: 06/30/2019 CLINICAL DATA:  Code stroke.  Left facial droop and weakness. EXAM: CT HEAD WITHOUT CONTRAST TECHNIQUE: Contiguous axial images were obtained from the base of the skull through the vertex without intravenous contrast. COMPARISON:  None. FINDINGS: Brain: No evidence of acute infarction, hemorrhage, hydrocephalus, extra-axial collection or mass lesion/mass effect. Extensive chronic small vessel ischemic  gliosis for age in the white matter. Remote appearing lacunar infarct at the upper right putamen and internal capsule. Vascular: No hyperdense vessel or unexpected calcification. Skull: Normal. Negative for fracture or focal lesion. Sinuses/Orbits: No acute finding. Other: These results were communicated to Dr. Laurence SlateAroor at 8:45 pmon 7/6/2020by text page via the [facility] messaging system. ASPECTS [facility]([place] Stroke Program Early CT Score) -  Ganglionic level infarction (caudate, lentiform nuclei, internal capsule, insula, M1-M3 cortex): 7 - Supraganglionic infarction (M4-M6 cortex): 3 Total score (0-10 with 10 being normal): 10 IMPRESSION: 1. Negative for intracranial hemorrhage or visible acute infarct. 2. Chronic small vessel ischemia greater than expected for age. Electronically Signed   By: Marnee SpringJonathon  Watts M.D.   On: 06/30/2019 20:47    Pending Labs [employer] Labs (From admission, onward)    Start     Ordered   Signed and Held  HIV antibody (Routine Testing)  Tomorrow morning,   R     Signed and Held   Signed and Held  Hemoglobin A1c  Tomorrow morning,   R     Signed and Held   Signed and Held  Lipid panel  Tomorrow morning,   R    Comments: Fasting    Signed and Held   Signed and Held  CBC  (enoxaparin (LOVENOX)    CrCl >/= 30 ml/min)  Once,   R    Comments: Baseline for enoxaparin therapy IF NOT ALREADY DRAWN.  Notify MD if PLT < 100 K.    Signed and Held   Signed and Held  Creatinine, serum  (enoxaparin (LOVENOX)    CrCl >/= 30 ml/min)  Once,   R    Comments: Baseline for enoxaparin therapy IF NOT ALREADY DRAWN.    Signed and Held   Signed and Held  Creatinine, serum  (enoxaparin (LOVENOX)    CrCl >/= 30 ml/min)  Weekly,   R    Comments: while on enoxaparin therapy    Signed and Held   Signed and Held  Comprehensive metabolic panel  Tomorrow morning,   R     Signed and Held   Signed and Held  CBC  Tomorrow morning,   R     Signed and Held          Vitals/Pain Today's Vitals    06/30/19 2028 06/30/19 2136 06/30/19 2137  BP: (!) 162/96 (!) 162/96   Pulse: 84    Resp:  16   Temp:  98 F (36.7 C)   TempSrc:  Oral   SpO2:  98%   Weight:   81.6 kg  Height:   5\' 7"  (1.702 m)    Isolation Precautions No active isolations  Medications Medications  0.9 %  sodium chloride infusion (has no administration in time range)  potassium chloride 10 mEq in 100 mL IVPB (has no administration in time range)  LORazepam (ATIVAN) tablet 1 mg (has no administration in time range)    Or  LORazepam (ATIVAN) injection 1 mg (has no administration in time range)  thiamine (VITAMIN B-1) tablet 100 mg (has no administration in time range)    Or  thiamine (B-1) injection 100 mg (has no administration in time range)  folic acid (FOLVITE) tablet 1 mg (has no administration in time range)  multivitamin with minerals tablet 1 tablet (has no administration in time range)  sodium chloride flush (NS) 0.9 % injection 3 mL (3 mLs Intravenous Given 06/30/19 2140)  iohexol (OMNIPAQUE) 350 MG/ML injection 75 mL (75 mLs Intravenous Contrast Given 06/30/19 2042)    Mobility walks with person assist High fall risk   Focused Assessments Neuro Assessment Handoff:  Swallow screen pass? No    NIH Stroke Scale ( + Modified Stroke Scale Criteria)  Interval: Initial Level of Consciousness (1a.)   : Alert, keenly responsive LOC Questions (1b. )   +: Answers both questions correctly LOC Commands (  1c. )   + : Performs both tasks correctly Best Gaze (2. )  +: Normal Visual (3. )  +: No visual loss Facial Palsy (4. )    : Normal symmetrical movements Motor Arm, Left (5a. )   +: No drift Motor Arm, Right (5b. )   +: No drift Motor Leg, Left (6a. )   +: No drift Motor Leg, Right (6b. )   +: No drift Limb Ataxia (7. ): Absent Sensory (8. )   +: Normal, no sensory loss Best Language (9. )   +: No aphasia Dysarthria (10. ): Normal Extinction/Inattention (11.)   +: No Abnormality Modified SS Total  +:  0 Complete NIHSS TOTAL: 0 Last date known well: 06/30/19 Last time known well: 1930(questionable per EMS) Neuro Assessment: Exceptions to WDL Neuro Checks:   Initial (06/30/19 2028)  Last Documented NIHSS Modified Score: 0 (06/30/19 2114) Has TPA been given? No If patient is a Neuro Trauma and patient is going to OR before floor call report to [place] nurse: [phone] or [phone]     R Recommendations: See Admitting Provider Note  Report given to:   Additional Notes:  Stroke swallow screen not done d/t pt not cooperating to swallow all of water and not put cup down

## 2019-06-30 NOTE — H&P (Addendum)
History and Physical    Zachary Flores UXN:[id number] DOB: 1966/01/05 DOA: 06/30/2019  PCP: Patient, No Pcp Per  Patient coming from: Home.  Chief Complaint: Left-sided weakness.  HPI: Zachary Flores is a 53 y.o. male with history of alcohol abuse was brought to the ER after patient was found to have left-sided weakness.  Patient states he started drinking alcohol yesterday around 11 AM and around 7 PM he had a fall around his house when neighbors called EMS and patient was taken back to his house inside.  At around 7:30 PM family noted that patient has left facial droop with left-sided weakness and left arm drift.  Unable to lift his left lower extremity.  Patient was brought to the ER.  ED Course: In the ER initially was found to have left facial droop and left-sided weakness which soon improved.  CT head followed by CT angiogram of the head and neck did not show any large vessel occlusion per MRI brain was negative.  Neurologist on-call was consulted and patient is being admitted for TIA.  Patient had passed swallow.  Patient's labs show AST 87 ALT 48 creatinine 1.9 potassium 3.4 sodium 133 alcohol level was 281.  COVID-19 negative.  EKG was showing sinus tachycardia nonspecific T wave changes.  Patient admitted for possible TIA.  Review of Systems: As per HPI, rest all negative.   History reviewed. No pertinent past medical history.  History reviewed. No pertinent surgical history.   reports that he has been smoking. He has never used smokeless tobacco. He reports current alcohol use of about 24.0 standard drinks of alcohol per week. No history on file for drug.  No Known Allergies  Family History  Family history unknown: Yes    Prior to Admission medications   Not on File    Physical Exam: Constitutional: Moderately built and nourished. Vitals:   06/30/19 2028 06/30/19 2136 06/30/19 2137  BP: (!) 162/96 (!) 162/96   Pulse: 84    Resp:  16   Temp:  98 F (36.7 C)    TempSrc:  Oral   SpO2:  98%   Weight:   81.6 kg  Height:   5\' 7"  (1.702 m)   Eyes: Anicteric no pallor. ENMT: No discharge from the ears eyes nose or mouth. Neck: No mass or.  No neck rigidity. Respiratory: No rhonchi or crepitations. Cardiovascular: S1-S2 heard. Abdomen: Soft nontender bowel sounds present. Musculoskeletal: No edema. Skin: No rash. Neurologic: Alert awake oriented to time place and person.  Moves all extremities 5 x 5.  No facial asymmetry tongue is midline pupils are equal and reacting to light. Psychiatric: Appears normal per normal affect.   Labs on Admission: I have personally reviewed following labs and imaging studies  CBC: Recent Labs  Lab 06/30/19 2028 06/30/19 2035  WBC 5.7  --   NEUTROABS 3.8  --   HGB 14.8 16.7  HCT 44.8 49.0  MCV 89.8  --   PLT 313  --    Basic Metabolic Panel: Recent Labs  Lab 06/30/19 2028 06/30/19 2035  NA 133* 135  K 3.4* 3.5  CL 99 101  CO2 20*  --   GLUCOSE 122* 120*  BUN 5* 4*  CREATININE 0.92 1.40*  CALCIUM 8.7*  --    GFR: Estimated Creatinine Clearance: 61 mL/min (A) (by C-G formula based on SCr of 1.4 mg/dL (H)). Liver Function Tests: Recent Labs  Lab 06/30/19 2028  AST 87*  ALT 48*  ALKPHOS 79  BILITOT 0.5  PROT 7.1  ALBUMIN 4.4   No results for input(s): LIPASE, AMYLASE in the last 168 hours. No results for input(s): AMMONIA in the last 168 hours. Coagulation Profile: Recent Labs  Lab 06/30/19 2028  INR 0.9   Cardiac Enzymes: No results for input(s): CKTOTAL, CKMB, CKMBINDEX, TROPONINI in the last 168 hours. BNP (last 3 results) No results for input(s): PROBNP in the last 8760 hours. HbA1C: No results for input(s): HGBA1C in the last 72 hours. CBG: No results for input(s): GLUCAP in the last 168 hours. Lipid Profile: No results for input(s): CHOL, HDL, LDLCALC, TRIG, CHOLHDL, LDLDIRECT in the last 72 hours. Thyroid Function Tests: No results for input(s): TSH, T4TOTAL, FREET4,  T3FREE, THYROIDAB in the last 72 hours. Anemia Panel: No results for input(s): VITAMINB12, FOLATE, FERRITIN, TIBC, IRON, RETICCTPCT in the last 72 hours. Urine analysis: No results found for: COLORURINE, APPEARANCEUR, LABSPEC, PHURINE, GLUCOSEU, HGBUR, BILIRUBINUR, KETONESUR, PROTEINUR, UROBILINOGEN, NITRITE, LEUKOCYTESUR Sepsis Labs: @LABRCNTIP (procalcitonin:4,lacticidven:4) )No results found for this or any previous visit (from the past 240 hour(s)).   Radiological Exams on Admission: Ct Angio Head W Or Wo Contrast  Result Date: 06/30/2019 CLINICAL DATA:  Stroke syndrome EXAM: CT ANGIOGRAPHY HEAD AND NECK TECHNIQUE: Multidetector CT imaging of the head and neck was performed using the standard protocol during bolus administration of intravenous contrast. Multiplanar CT image reconstructions and MIPs were obtained to evaluate the vascular anatomy. Carotid stenosis measurements (when applicable) are obtained utilizing NASCET criteria, using the distal internal carotid diameter as the denominator. CONTRAST:  75 cc Omnipaque 350 intravenous COMPARISON:  Noncontrast head CT earlier today FINDINGS: CTA NECK FINDINGS Aortic arch: Negative Right carotid system: Vessels are smooth and widely patent. No atheromatous changes Left carotid system: Mild mainly calcified plaque about the bifurcation without stenosis or ulceration. Vertebral arteries: Left dominant vertebral artery. Both vertebral arteries are smooth and diffusely patent. No proximal subclavian stenosis. Skeleton: No acute or aggressive finding.  Dental caries Other neck: Negative Upper chest: Remote injury to the left pectoralis major with partially visualized coarse calcification. Review of the MIP images confirms the above findings CTA HEAD FINDINGS Anterior circulation: Mild calcification of the carotid siphons. No flow limiting stenosis or major branch occlusion. Negative for aneurysm Posterior circulation: Left dominant vertebral artery. The  vertebral and basilar arteries are smooth and widely patent. Hypoplastic P1 segments Venous sinuses: Patent Anatomic variants: As above Delayed phase: Not obtained Review of the MIP images confirms the above findings IMPRESSION: 1. Negative for large vessel occlusion or other emergent finding. 2. Mild atherosclerosis without significant stenosis. Electronically Signed   By: Marnee SpringJonathon  Watts M.D.   On: 06/30/2019 21:02   Ct Angio Neck W Or Wo Contrast  Result Date: 06/30/2019 CLINICAL DATA:  Stroke syndrome EXAM: CT ANGIOGRAPHY HEAD AND NECK TECHNIQUE: Multidetector CT imaging of the head and neck was performed using the standard protocol during bolus administration of intravenous contrast. Multiplanar CT image reconstructions and MIPs were obtained to evaluate the vascular anatomy. Carotid stenosis measurements (when applicable) are obtained utilizing NASCET criteria, using the distal internal carotid diameter as the denominator. CONTRAST:  75 cc Omnipaque 350 intravenous COMPARISON:  Noncontrast head CT earlier today FINDINGS: CTA NECK FINDINGS Aortic arch: Negative Right carotid system: Vessels are smooth and widely patent. No atheromatous changes Left carotid system: Mild mainly calcified plaque about the bifurcation without stenosis or ulceration. Vertebral arteries: Left dominant vertebral artery. Both vertebral arteries are smooth and diffusely patent. No proximal subclavian stenosis. Skeleton: No  acute or aggressive finding.  Dental caries Other neck: Negative Upper chest: Remote injury to the left pectoralis major with partially visualized coarse calcification. Review of the MIP images confirms the above findings CTA HEAD FINDINGS Anterior circulation: Mild calcification of the carotid siphons. No flow limiting stenosis or major branch occlusion. Negative for aneurysm Posterior circulation: Left dominant vertebral artery. The vertebral and basilar arteries are smooth and widely patent. Hypoplastic P1  segments Venous sinuses: Patent Anatomic variants: As above Delayed phase: Not obtained Review of the MIP images confirms the above findings IMPRESSION: 1. Negative for large vessel occlusion or other emergent finding. 2. Mild atherosclerosis without significant stenosis. Electronically Signed   By: Marnee SpringJonathon  Watts M.D.   On: 06/30/2019 21:02   Mr Brain Wo Contrast  Result Date: 06/30/2019 CLINICAL DATA:  Initial evaluation for acute stroke. EXAM: MRI HEAD WITHOUT CONTRAST TECHNIQUE: Multiplanar, multiecho pulse sequences of the brain and surrounding structures were obtained without intravenous contrast. COMPARISON:  Prior CTA from earlier same day. FINDINGS: Brain: Limited exam with DWI, gradient echo imaging, and FLAIR sequence only was performed. Images are degraded by motion artifact. Diffusion-weighted imaging demonstrates no evidence for acute or subacute ischemia. Gray-white matter differentiation maintained. No evidence for acute intracranial hemorrhage. Few scattered chronic micro hemorrhages noted involving the brainstem. Underlying age-related cerebral atrophy. Confluent T2/FLAIR hyperintensity within the periventricular and deep white matter both cerebral hemispheres most consistent with chronic microvascular ischemic disease, advanced for age. No mass lesion or midline shift. No hydrocephalus. No extra-axial fluid collection. Vascular: Not well assessed on this limited examination. Skull and upper cervical spine: Not well assessed on this limited examination. Sinuses/Orbits: Grossly negative, although not well assessed on this limited exam. Other: None. IMPRESSION: 1. Negative brain MRI.  No acute intracranial infarct identified. 2. 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 Electronically Signed   By: Rise MuBenjamin  McClintock M.D.   On: 06/30/2019 21:27   Ct Head Code Stroke Wo Contrast  Result Date: 06/30/2019 CLINICAL DATA:  Code stroke.  Left facial droop and weakness. EXAM: CT HEAD  WITHOUT CONTRAST TECHNIQUE: Contiguous axial images were obtained from the base of the skull through the vertex without intravenous contrast. COMPARISON:  None. FINDINGS: Brain: No evidence of acute infarction, hemorrhage, hydrocephalus, extra-axial collection or mass lesion/mass effect. Extensive chronic small vessel ischemic gliosis for age in the white matter. Remote appearing lacunar infarct at the upper right putamen and internal capsule. Vascular: No hyperdense vessel or unexpected calcification. Skull: Normal. Negative for fracture or focal lesion. Sinuses/Orbits: No acute finding. Other: These results were communicated to Dr. Laurence SlateAroor at 8:45 pmon 7/6/2020by text page via the [facility] messaging system. ASPECTS [facility]([place]ta Stroke Program Early CT Score) - Ganglionic level infarction (caudate, lentiform nuclei, internal capsule, insula, M1-M3 cortex): 7 - Supraganglionic infarction (M4-M6 cortex): 3 Total score (0-10 with 10 being normal): 10 IMPRESSION: 1. Negative for intracranial hemorrhage or visible acute infarct. 2. Chronic small vessel ischemia greater than expected for age. Electronically Signed   By: Marnee SpringJonathon  Watts M.D.   On: 06/30/2019 20:47    EKG: Independently reviewed.  Normal sinus with nonspecific T wave changes.  Assessment/Plan Principal Problem:   TIA (transient ischemic attack) Active Problems:   Alcohol abuse with intoxication (HCC)   Elevated LFTs    1. TIA -appreciate neurology consult.  Patient passed swallow patient is on aspirin Lipitor as adjusted by neurology.  Neurochecks.  Check 2D echo closely monitor in telemetry physical therapy consult.  Check hemoglobin A1c lipid panel.  2. Alcohol abuse Placed patient on CIWA protocol.  Advised about quitting. 3. Elevated LFTs likely from alcohol abuse.  Check acute hepatitis panel.  Note that patient is also started on Lipitor so closely follow LFTs. 4. Elevated blood pressure allow for permissive hypertension given the possible TIA.   Follow blood pressure trends. 5. Mild hypokalemia replace recheck check magnesium.   DVT prophylaxis: Lovenox. Code Status: Full code. Family Communication: Discussed with patient. Disposition Plan: Home. Consults called: Neurology. Admission status: Observation.   Eduard ClosArshad N  MD [facility] Pager [phone].  If 7PM-7AM, please contact night-coverage [email or web address] Password [email or web address]  06/30/2019, 10:44 PM

## 2019-06-30 NOTE — ED Provider Notes (Signed)
[facility] EMERGENCY DEPARTMENT Provider Note   CSN: [id number] Arrival date & time: 06/30/19  2027    History   Chief Complaint Chief Complaint  Patient presents with   Transient Ischemic Attack    HPI Thanh Mottern is a 53 y.o. male.     HPI Presented to the ED for evaluation as a code stroke.  Patient initially was found intoxicated outside of his house.  Police and EMS were called and he was helped back into the home.  At some point later on he fell in the home .  EMS was called after family noticed the pt having weakness on the left side and a facial droop.  This activated a code stroke.  Last time normal was reportedly at 11 am.    No known injury.  No known fever. No past medical history on file.  There are no active problems to display for this patient.    Home Medications    Prior to Admission medications   Not on File    Family History No family history on file.  Social History Social History   Tobacco Use   Smoking status: Current Every Day Smoker   Smokeless tobacco: Never Used  Substance Use Topics   Alcohol use: Yes    Alcohol/week: 24.0 standard drinks    Types: 24 Cans of beer per week   Drug use: Not on file     Allergies   Patient has no known allergies.   Review of Systems Review of Systems  All other systems reviewed and are negative.    Physical Exam Updated Vital Signs BP (!) 162/96 (BP Location: Right Arm)    Pulse 84    Temp 98 F (36.7 C) (Oral)    Resp 16    Ht 1.702 m (5\' 7" )    Wt 81.6 kg    SpO2 98%    BMI 28.19 kg/m   Physical Exam Vitals signs and nursing note reviewed.  Constitutional:      General: He is not in acute distress.    Appearance: He is well-developed.  HENT:     Head: Normocephalic and atraumatic.     Right Ear: External ear normal.     Left Ear: External ear normal.  Eyes:     General: No scleral icterus.       Right eye: No discharge.        Left eye: No discharge.     Conjunctiva/sclera: Conjunctivae normal.  Neck:     Musculoskeletal: Neck supple.     Trachea: No tracheal deviation.  Cardiovascular:     Rate and Rhythm: Normal rate.  Pulmonary:     Effort: Pulmonary effort is normal. No respiratory distress.     Breath sounds: No stridor.  Abdominal:     General: There is no distension.  Musculoskeletal:        General: No swelling or deformity.  Skin:    General: Skin is warm and dry.     Findings: No rash.  Neurological:     Mental Status: He is alert.     Cranial Nerves: Cranial nerve deficit present.     Motor: Weakness present.     Comments: Left-sided facial droop, to lift left arm above the bed but with drift, left-sided neglect      ED Treatments / Results  Labs (all labs ordered are listed, but only abnormal results are displayed) Labs Reviewed  COMPREHENSIVE METABOLIC PANEL - Abnormal; Notable  for the following components:      Result Value   Sodium 133 (*)    Potassium 3.4 (*)    CO2 20 (*)    Glucose, Bld 122 (*)    BUN 5 (*)    Calcium 8.7 (*)    AST 87 (*)    ALT 48 (*)    All other components within normal limits  I-STAT CHEM 8, ED - Abnormal; Notable for the following components:   BUN 4 (*)    Creatinine, Ser 1.40 (*)    Glucose, Bld 120 (*)    Calcium, Ion 0.96 (*)    TCO2 21 (*)    All other components within normal limits  SARS CORONAVIRUS 2 (HOSPITAL ORDER, PERFORMED IN [facility])  PROTIME-INR  APTT  CBC  DIFFERENTIAL  ETHANOL  CBG MONITORING, ED    EKG None  Radiology Ct Angio Head W Or Wo Contrast  Result Date: 06/30/2019 CLINICAL DATA:  Stroke syndrome EXAM: CT ANGIOGRAPHY HEAD AND NECK TECHNIQUE: Multidetector CT imaging of the head and neck was performed using the standard protocol during bolus administration of intravenous contrast. Multiplanar CT image reconstructions and MIPs were obtained to evaluate the vascular anatomy. Carotid stenosis measurements (when applicable) are  obtained utilizing NASCET criteria, using the distal internal carotid diameter as the denominator. CONTRAST:  75 cc Omnipaque 350 intravenous COMPARISON:  Noncontrast head CT earlier today FINDINGS: CTA NECK FINDINGS Aortic arch: Negative Right carotid system: Vessels are smooth and widely patent. No atheromatous changes Left carotid system: Mild mainly calcified plaque about the bifurcation without stenosis or ulceration. Vertebral arteries: Left dominant vertebral artery. Both vertebral arteries are smooth and diffusely patent. No proximal subclavian stenosis. Skeleton: No acute or aggressive finding.  Dental caries Other neck: Negative Upper chest: Remote injury to the left pectoralis major with partially visualized coarse calcification. Review of the MIP images confirms the above findings CTA HEAD FINDINGS Anterior circulation: Mild calcification of the carotid siphons. No flow limiting stenosis or major branch occlusion. Negative for aneurysm Posterior circulation: Left dominant vertebral artery. The vertebral and basilar arteries are smooth and widely patent. Hypoplastic P1 segments Venous sinuses: Patent Anatomic variants: As above Delayed phase: Not obtained Review of the MIP images confirms the above findings IMPRESSION: 1. Negative for large vessel occlusion or other emergent finding. 2. Mild atherosclerosis without significant stenosis. Electronically Signed   By: Marnee SpringJonathon  Watts M.D.   On: 06/30/2019 21:02   Ct Angio Neck W Or Wo Contrast  Result Date: 06/30/2019 CLINICAL DATA:  Stroke syndrome EXAM: CT ANGIOGRAPHY HEAD AND NECK TECHNIQUE: Multidetector CT imaging of the head and neck was performed using the standard protocol during bolus administration of intravenous contrast. Multiplanar CT image reconstructions and MIPs were obtained to evaluate the vascular anatomy. Carotid stenosis measurements (when applicable) are obtained utilizing NASCET criteria, using the distal internal carotid diameter as  the denominator. CONTRAST:  75 cc Omnipaque 350 intravenous COMPARISON:  Noncontrast head CT earlier today FINDINGS: CTA NECK FINDINGS Aortic arch: Negative Right carotid system: Vessels are smooth and widely patent. No atheromatous changes Left carotid system: Mild mainly calcified plaque about the bifurcation without stenosis or ulceration. Vertebral arteries: Left dominant vertebral artery. Both vertebral arteries are smooth and diffusely patent. No proximal subclavian stenosis. Skeleton: No acute or aggressive finding.  Dental caries Other neck: Negative Upper chest: Remote injury to the left pectoralis major with partially visualized coarse calcification. Review of the MIP images confirms the above findings  CTA HEAD FINDINGS Anterior circulation: Mild calcification of the carotid siphons. No flow limiting stenosis or major branch occlusion. Negative for aneurysm Posterior circulation: Left dominant vertebral artery. The vertebral and basilar arteries are smooth and widely patent. Hypoplastic P1 segments Venous sinuses: Patent Anatomic variants: As above Delayed phase: Not obtained Review of the MIP images confirms the above findings IMPRESSION: 1. Negative for large vessel occlusion or other emergent finding. 2. Mild atherosclerosis without significant stenosis. Electronically Signed   By: Marnee SpringJonathon  Watts M.D.   On: 06/30/2019 21:02   Mr Brain Wo Contrast  Result Date: 06/30/2019 CLINICAL DATA:  Initial evaluation for acute stroke. EXAM: MRI HEAD WITHOUT CONTRAST TECHNIQUE: Multiplanar, multiecho pulse sequences of the brain and surrounding structures were obtained without intravenous contrast. COMPARISON:  Prior CTA from earlier same day. FINDINGS: Brain: Limited exam with DWI, gradient echo imaging, and FLAIR sequence only was performed. Images are degraded by motion artifact. Diffusion-weighted imaging demonstrates no evidence for acute or subacute ischemia. Gray-white matter differentiation maintained.  No evidence for acute intracranial hemorrhage. Few scattered chronic micro hemorrhages noted involving the brainstem. Underlying age-related cerebral atrophy. Confluent T2/FLAIR hyperintensity within the periventricular and deep white matter both cerebral hemispheres most consistent with chronic microvascular ischemic disease, advanced for age. No mass lesion or midline shift. No hydrocephalus. No extra-axial fluid collection. Vascular: Not well assessed on this limited examination. Skull and upper cervical spine: Not well assessed on this limited examination. Sinuses/Orbits: Grossly negative, although not well assessed on this limited exam. Other: None. IMPRESSION: 1. Negative brain MRI.  No acute intracranial infarct identified. 2. 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 Electronically Signed   By: Rise MuBenjamin  McClintock M.D.   On: 06/30/2019 21:27   Ct Head Code Stroke Wo Contrast  Result Date: 06/30/2019 CLINICAL DATA:  Code stroke.  Left facial droop and weakness. EXAM: CT HEAD WITHOUT CONTRAST TECHNIQUE: Contiguous axial images were obtained from the base of the skull through the vertex without intravenous contrast. COMPARISON:  None. FINDINGS: Brain: No evidence of acute infarction, hemorrhage, hydrocephalus, extra-axial collection or mass lesion/mass effect. Extensive chronic small vessel ischemic gliosis for age in the white matter. Remote appearing lacunar infarct at the upper right putamen and internal capsule. Vascular: No hyperdense vessel or unexpected calcification. Skull: Normal. Negative for fracture or focal lesion. Sinuses/Orbits: No acute finding. Other: These results were communicated to Dr. Laurence SlateAroor at 8:45 pmon 7/6/2020by text page via the [facility] messaging system. ASPECTS [facility]([place] Stroke Program Early CT Score) - Ganglionic level infarction (caudate, lentiform nuclei, internal capsule, insula, M1-M3 cortex): 7 - Supraganglionic infarction (M4-M6 cortex): 3 Total score (0-10 with  10 being normal): 10 IMPRESSION: 1. Negative for intracranial hemorrhage or visible acute infarct. 2. Chronic small vessel ischemia greater than expected for age. Electronically Signed   By: Marnee SpringJonathon  Watts M.D.   On: 06/30/2019 20:47    Procedures Procedures (including critical care time)  Medications Ordered in ED Medications  sodium chloride flush (NS) 0.9 % injection 3 mL (3 mLs Intravenous Given 06/30/19 2140)  iohexol (OMNIPAQUE) 350 MG/ML injection 75 mL (75 mLs Intravenous Contrast Given 06/30/19 2042)     Initial Impression / Assessment and Plan / ED Course  I have reviewed the triage vital signs and the nursing notes.  Pertinent labs & imaging results that were available during my care of the patient were reviewed by me and considered in my medical decision making (see chart for details).  Clinical Course as of Jun 29 2224  Mon Jun 30, 2019  2116 Was evaluated by neurology.  Findings were concerning for the possibility of stroke.  Initial imaging tests are negative.  Symptoms are most suggestive of a TIA vs issues with alcohol intoxication.   [JK]    Clinical Course User Index [JK] Linwood DibblesKnapp, Captain Blucher, MD     Patient presented to ED for evaluation of possible stroke.  This was in the setting of alcohol consumption.  Patient's initial exam was concerning for possibility of acute stroke.  Patient was evaluated by neurology on arrival.  Subsequent MRI was negative for acute process.  Patient symptoms seem to be improving.  Plan is admission for TIA evaluation.  Patient's alcohol level is pending but this could be a significant contributing factor as well.  Final Clinical Impressions(s) / ED Diagnoses   Final diagnoses:  TIA (transient ischemic attack)  Alcoholic intoxication with complication [facility](HCC)      Linwood DibblesKnapp, Julita Ozbun, MD 06/30/19 2226

## 2019-07-01 ENCOUNTER — Other Ambulatory Visit: Payer: Self-pay

## 2019-07-01 ENCOUNTER — Observation Stay (HOSPITAL_BASED_OUTPATIENT_CLINIC_OR_DEPARTMENT_OTHER): Payer: Self-pay

## 2019-07-01 ENCOUNTER — Observation Stay (HOSPITAL_COMMUNITY): Payer: Self-pay

## 2019-07-01 DIAGNOSIS — F10929 Alcohol use, unspecified with intoxication, unspecified: Secondary | ICD-10-CM

## 2019-07-01 DIAGNOSIS — G459 Transient cerebral ischemic attack, unspecified: Secondary | ICD-10-CM

## 2019-07-01 LAB — COMPREHENSIVE METABOLIC PANEL
ALT: 53 U/L — ABNORMAL HIGH (ref 0–44)
AST: 97 U/L — ABNORMAL HIGH (ref 15–41)
Albumin: 4.1 g/dL (ref 3.5–5.0)
Alkaline Phosphatase: 82 U/L (ref 38–126)
Anion gap: 10 (ref 5–15)
BUN: <5 mg/dL — ABNORMAL LOW (ref 6–20)
CO2: 24 mmol/L (ref 22–32)
Calcium: 9.1 mg/dL (ref 8.9–10.3)
Chloride: 106 mmol/L (ref 98–111)
Creatinine, Ser: 0.95 mg/dL (ref 0.61–1.24)
GFR calc Af Amer: >60 mL/min (ref >60)
GFR calc non Af Amer: >60 mL/min (ref >60)
Glucose, Bld: 104 mg/dL — ABNORMAL HIGH (ref 70–99)
Potassium: 3.9 mmol/L (ref 3.5–5.1)
Sodium: 140 mmol/L (ref 135–145)
Total Bilirubin: 0.6 mg/dL (ref 0.3–1.2)
Total Protein: 7 g/dL (ref 6.5–8.1)

## 2019-07-01 LAB — ECHOCARDIOGRAM COMPLETE
Height: 67 in
Weight: 3093.49 oz

## 2019-07-01 LAB — MAGNESIUM: Magnesium: 2 mg/dL (ref 1.7–2.4)

## 2019-07-01 LAB — CBC
HCT: 43.5 % (ref 39.0–52.0)
Hemoglobin: 14.6 g/dL (ref 13.0–17.0)
MCH: 29.9 pg (ref 26.0–34.0)
MCHC: 33.6 g/dL (ref 30.0–36.0)
MCV: 89 fL (ref 80.0–100.0)
Platelets: 342 10*3/uL (ref 150–400)
RBC: 4.89 MIL/uL (ref 4.22–5.81)
RDW: 14.1 % (ref 11.5–15.5)
WBC: 8 10*3/uL (ref 4.0–10.5)
nRBC: 0 % (ref 0.0–0.2)

## 2019-07-01 LAB — LIPID PANEL
Cholesterol: 170 mg/dL (ref 0–200)
HDL: 54 mg/dL (ref >40)
LDL Cholesterol: 89 mg/dL (ref 0–99)
Total CHOL/HDL Ratio: 3.1 RATIO
Triglycerides: 135 mg/dL (ref <150)
VLDL: 27 mg/dL (ref 0–40)

## 2019-07-01 LAB — HEMOGLOBIN A1C
Hgb A1c MFr Bld: 6.1 % — ABNORMAL HIGH (ref 4.8–5.6)
Mean Plasma Glucose: 128.37 mg/dL

## 2019-07-01 LAB — GLUCOSE, CAPILLARY: Glucose-Capillary: 115 mg/dL — ABNORMAL HIGH (ref 70–99)

## 2019-07-01 LAB — HIV ANTIBODY (ROUTINE TESTING W REFLEX): HIV Screen 4th Generation wRfx: NONREACTIVE

## 2019-07-01 MED ORDER — ACETAMINOPHEN 325 MG PO TABS
650.0000 mg | ORAL_TABLET | ORAL | Status: DC | PRN
  Administered 2019-07-01: 650 mg via ORAL
  Filled 2019-07-01: qty 2

## 2019-07-01 MED ORDER — ASPIRIN 325 MG PO TABS
325.0000 mg | ORAL_TABLET | Freq: Every day | ORAL | Status: DC
  Administered 2019-07-01: 325 mg via ORAL
  Filled 2019-07-01: qty 1

## 2019-07-01 MED ORDER — ACETAMINOPHEN 160 MG/5ML PO SOLN: 650.0000 mg | ORAL | Status: DC | PRN

## 2019-07-01 MED ORDER — STROKE: EARLY STAGES OF RECOVERY BOOK
Freq: Once | Status: AC
  Administered 2019-07-01: 05:00:00
  Filled 2019-07-01: qty 1

## 2019-07-01 MED ORDER — ACETAMINOPHEN 650 MG RE SUPP: 650.0000 mg | RECTAL | Status: DC | PRN

## 2019-07-01 MED ORDER — ASPIRIN 300 MG RE SUPP: 300.0000 mg | Freq: Every day | RECTAL | Status: DC

## 2019-07-01 MED ORDER — LISINOPRIL 5 MG PO TABS: 5.0000 mg | ORAL_TABLET | Freq: Every day | ORAL | 0 refills | Status: DC

## 2019-07-01 MED ORDER — METOPROLOL TARTRATE 25 MG PO TABS: 25.0000 mg | ORAL_TABLET | Freq: Two times a day (BID) | ORAL | 0 refills | Status: DC

## 2019-07-01 MED ORDER — ENOXAPARIN SODIUM 40 MG/0.4ML ~~LOC~~ SOLN
40.0000 mg | SUBCUTANEOUS | Status: DC
  Administered 2019-07-01: 40 mg via SUBCUTANEOUS
  Filled 2019-07-01: qty 0.4

## 2019-07-01 MED ORDER — POTASSIUM CHLORIDE CRYS ER 20 MEQ PO TBCR
20.0000 meq | EXTENDED_RELEASE_TABLET | Freq: Once | ORAL | Status: AC
  Administered 2019-07-01: 20 meq via ORAL
  Filled 2019-07-01: qty 1

## 2019-07-01 MED ORDER — ATORVASTATIN CALCIUM 80 MG PO TABS: 80.0000 mg | ORAL_TABLET | Freq: Every day | ORAL | Status: DC

## 2019-07-01 MED FILL — LISINOPRIL 5 MG TABLET: 5 | 30 days supply | Qty: 30 | Fill #0

## 2019-07-01 MED FILL — METOPROLOL TARTRATE 25 MG T: 25 | 30 days supply | Qty: 60 | Fill #0

## 2019-07-01 NOTE — Evaluation (Signed)
Speech Language Pathology Evaluation Patient Details Name: Zachary Flores MRN: [id number] DOB: 11/14/1966 Today's Date: 07/01/2019 Time: 8119-1478 SLP Time Calculation (min) (ACUTE ONLY): 20 min  Problem List:  Patient Active Problem List   Diagnosis Date Noted  . TIA (transient ischemic attack) 06/30/2019  . Alcohol abuse with intoxication ([place]) 06/30/2019  . Elevated LFTs 06/30/2019   Past Medical History: History reviewed. No pertinent past medical history. Past Surgical History: History reviewed. No pertinent surgical history. HPI:  Pt is a 53 y.o. male with history of alcohol abuse  who was brought to the ER after patient was found to have left-sided weakness. MRI of the brain was negative.    Assessment / Plan / Recommendation Clinical Impression  Pt reported that he is employed part-time stocking a Environmental consultant and is the primary caregiver for his 36 year-old mother. He denied any baseline or new deficits in speech, language or cognition. His speech and language skills are currently within normal limits and his cognitive-linguistic skills were within functional limits. Mild articulatory imprecision was noted but pt stated that this is his baseline. Further skilled SLP services are not clinically indicated at this time. Pt, and nursing were educated regarding this and both parties verbalized understanding as well as agreement with plan of care.    SLP Assessment  SLP Recommendation/Assessment: Patient does not need any further Speech Lanaguage Pathology Services SLP Visit Diagnosis: Cognitive communication deficit (R41.841)    Follow Up Recommendations  None    Frequency and Duration           SLP Evaluation Cognition  Overall Cognitive Status: Within Functional Limits for tasks assessed Arousal/Alertness: Awake/alert Orientation Level: Oriented to person;Oriented to place;Disoriented to time(Orientated to month, day, year not date (provided the 16th)) Attention:  Focused;Sustained Focused Attention: Appears intact Sustained Attention: Appears intact Memory: Appears intact(Immediate: 3/3; delayed: 2/3; with cues: 1/1) Awareness: Appears intact Problem Solving: Appears intact Executive Function: Reasoning;Sequencing Sequencing: Appears intact       Comprehension  Auditory Comprehension Overall Auditory Comprehension: Appears within functional limits for tasks assessed Yes/No Questions: Within Functional Limits Basic Biographical Questions: (5/5) Complex Questions: (5/5) Paragraph Comprehension (via yes/no questions): (4/4) Commands: Impaired Two Step Basic Commands: (4/4) Multistep Basic Commands: (3/4) Conversation: Complex Visual Recognition/Discrimination Discrimination: Within Function Limits Reading Comprehension Reading Status: Within funtional limits    Expression Expression Primary Mode of Expression: Verbal Verbal Expression Overall Verbal Expression: Appears within functional limits for tasks assessed Initiation: No impairment Automatic Speech: Counting;Day of week;Month of year(Counting: 10/10; Days: 7/7; Months: 10/12) Level of Generative/Spontaneous Verbalization: Conversation Repetition: Impaired Level of Impairment: Sentence level(4/5) Naming: No impairment Responsive: (5/5) Confrontation: Within functional limits(10/10) Convergent: (Sentence completion: 5/5) Pragmatics: No impairment   Oral / Motor  Oral Motor/Sensory Function Overall Oral Motor/Sensory Function: Within functional limits Motor Speech Overall Motor Speech: Appears within functional limits for tasks assessed Respiration: Within functional limits Phonation: Normal Resonance: Within functional limits Articulation: Impaired Level of Impairment: Conversation(But at baseline per pt) Intelligibility: Intelligible Motor Planning: Witnin functional limits Motor Speech Errors: Not applicable   Dmari Schubring I. Hardin Negus, [place], [place] Office number [phone] Pager [place] 07/01/2019, 11:26 AM

## 2019-07-01 NOTE — TOC Transition Note (Signed)
Transition of Care [facility]) - CM/SW Discharge Note   Patient Details  Name: Zachary Flores MRN: [id number] Date of Birth: February 02, 1966  Transition of Care [facility]) CM/SW Contact:  Pollie Friar, RN Phone Number: 07/01/2019, 4:07 PM   Clinical Narrative:    Pt discharged home with self care. No f/u per PT.  CM was able to get him an appt with [facility]. Information on the AVS and he can use [facility] pharmacy for his meds.  CM provided him information on alcohol rehab: outpatient services, inpatient services and counseling he can get over the phone or on the internet.  Pts meds delivered to the room by [facility] pharmacy. CM provided him cab voucher for transport home.   Final next level of care: Home/Self Care Barriers to Discharge: Inadequate or no insurance   Patient Goals and CMS Choice        Discharge Placement                       Discharge Plan and Services   Discharge Planning Services: CM Consult                                 Social Determinants of Health (SDOH) Interventions     Readmission Risk Interventions No flowsheet data found.

## 2019-07-01 NOTE — Progress Notes (Signed)
Received handout information from case management regarding substance abuse resources  Went over with pt at bedside  Pt verbalizes understanding  Pt also states he will need a taxi pass at discharge  Informed case manager

## 2019-07-01 NOTE — Progress Notes (Signed)
STROKE TEAM PROGRESS NOTE   INTERVAL HISTORY I have personally reviewed history of presenting illness in detail with the patient, reviewed electronic medical records and imaging films in PACS.  Patient states that he had too much to drink and slipped and fell and bruised his leg and that is why he had trouble moving it.  He denies symptoms of a stroke.  MRI scan of the brain is negative for acute infarct.  CT angiogram shows no significant large vessel stenosis in the brain of the neck.  Vitals:   07/01/19 0225 07/01/19 0335 07/01/19 0632 07/01/19 0858  BP: (!) 162/108 (!) 177/117 (!) 158/98 (!) 177/94  Pulse: 98 (!) 106  93  Resp: 20 20 20 17   Temp: 98.3 F (36.8 C) 98.2 F (36.8 C) 98 F (36.7 C)   TempSrc: Axillary Oral Axillary   SpO2: 100% 95% 98% 99%  Weight:      Height:        CBC:  Recent Labs  Lab 06/30/19 2028 06/30/19 2035 07/01/19 0742  WBC 5.7  --  8.0  NEUTROABS 3.8  --   --   HGB 14.8 16.7 14.6  HCT 44.8 49.0 43.5  MCV 89.8  --  89.0  PLT 313  --  342    Basic Metabolic Panel:  Recent Labs  Lab 06/30/19 2028 06/30/19 2035 07/01/19 0742  NA 133* 135 140  K 3.4* 3.5 3.9  CL 99 101 106  CO2 20*  --  24  GLUCOSE 122* 120* 104*  BUN 5* 4* <5*  CREATININE 0.92 1.40* 0.95  CALCIUM 8.7*  --  9.1  MG  --   --  2.0   Lipid Panel:     Component Value Date/Time   CHOL 170 07/01/2019 0742   TRIG 135 07/01/2019 0742   HDL 54 07/01/2019 0742   CHOLHDL 3.1 07/01/2019 0742   VLDL 27 07/01/2019 0742   LDLCALC 89 07/01/2019 0742   HgbA1c:  Lab Results  Component Value Date   HGBA1C 6.1 (H) 07/01/2019   Urine Drug Screen: No results found for: LABOPIA, COCAINSCRNUR, LABBENZ, AMPHETMU, THCU, LABBARB  Alcohol Level     Component Value Date/Time   ETH 281 (H) 06/30/2019 2156    IMAGING Ct Angio Head W Or Wo Contrast  Result Date: 06/30/2019 CLINICAL DATA:  Stroke syndrome EXAM: CT ANGIOGRAPHY HEAD AND NECK TECHNIQUE: Multidetector CT imaging of the  head and neck was performed using the standard protocol during bolus administration of intravenous contrast. Multiplanar CT image reconstructions and MIPs were obtained to evaluate the vascular anatomy. Carotid stenosis measurements (when applicable) are obtained utilizing NASCET criteria, using the distal internal carotid diameter as the denominator. CONTRAST:  75 cc Omnipaque 350 intravenous COMPARISON:  Noncontrast head CT earlier today FINDINGS: CTA NECK FINDINGS Aortic arch: Negative Right carotid system: Vessels are smooth and widely patent. No atheromatous changes Left carotid system: Mild mainly calcified plaque about the bifurcation without stenosis or ulceration. Vertebral arteries: Left dominant vertebral artery. Both vertebral arteries are smooth and diffusely patent. No proximal subclavian stenosis. Skeleton: No acute or aggressive finding.  Dental caries Other neck: Negative Upper chest: Remote injury to the left pectoralis major with partially visualized coarse calcification. Review of the MIP images confirms the above findings CTA HEAD FINDINGS Anterior circulation: Mild calcification of the carotid siphons. No flow limiting stenosis or major branch occlusion. Negative for aneurysm Posterior circulation: Left dominant vertebral artery. The vertebral and basilar arteries are smooth and  widely patent. Hypoplastic P1 segments Venous sinuses: Patent Anatomic variants: As above Delayed phase: Not obtained Review of the MIP images confirms the above findings IMPRESSION: 1. Negative for large vessel occlusion or other emergent finding. 2. Mild atherosclerosis without significant stenosis. Electronically Signed   By: Marnee SpringJonathon  Watts M.D.   On: 06/30/2019 21:02   Ct Angio Neck W Or Wo Contrast  Result Date: 06/30/2019 CLINICAL DATA:  Stroke syndrome EXAM: CT ANGIOGRAPHY HEAD AND NECK TECHNIQUE: Multidetector CT imaging of the head and neck was performed using the standard protocol during bolus  administration of intravenous contrast. Multiplanar CT image reconstructions and MIPs were obtained to evaluate the vascular anatomy. Carotid stenosis measurements (when applicable) are obtained utilizing NASCET criteria, using the distal internal carotid diameter as the denominator. CONTRAST:  75 cc Omnipaque 350 intravenous COMPARISON:  Noncontrast head CT earlier today FINDINGS: CTA NECK FINDINGS Aortic arch: Negative Right carotid system: Vessels are smooth and widely patent. No atheromatous changes Left carotid system: Mild mainly calcified plaque about the bifurcation without stenosis or ulceration. Vertebral arteries: Left dominant vertebral artery. Both vertebral arteries are smooth and diffusely patent. No proximal subclavian stenosis. Skeleton: No acute or aggressive finding.  Dental caries Other neck: Negative Upper chest: Remote injury to the left pectoralis major with partially visualized coarse calcification. Review of the MIP images confirms the above findings CTA HEAD FINDINGS Anterior circulation: Mild calcification of the carotid siphons. No flow limiting stenosis or major branch occlusion. Negative for aneurysm Posterior circulation: Left dominant vertebral artery. The vertebral and basilar arteries are smooth and widely patent. Hypoplastic P1 segments Venous sinuses: Patent Anatomic variants: As above Delayed phase: Not obtained Review of the MIP images confirms the above findings IMPRESSION: 1. Negative for large vessel occlusion or other emergent finding. 2. Mild atherosclerosis without significant stenosis. Electronically Signed   By: Marnee SpringJonathon  Watts M.D.   On: 06/30/2019 21:02   Mr Brain Wo Contrast  Result Date: 06/30/2019 CLINICAL DATA:  Initial evaluation for acute stroke. EXAM: MRI HEAD WITHOUT CONTRAST TECHNIQUE: Multiplanar, multiecho pulse sequences of the brain and surrounding structures were obtained without intravenous contrast. COMPARISON:  Prior CTA from earlier same day.  FINDINGS: Brain: Limited exam with DWI, gradient echo imaging, and FLAIR sequence only was performed. Images are degraded by motion artifact. Diffusion-weighted imaging demonstrates no evidence for acute or subacute ischemia. Gray-white matter differentiation maintained. No evidence for acute intracranial hemorrhage. Few scattered chronic micro hemorrhages noted involving the brainstem. Underlying age-related cerebral atrophy. Confluent T2/FLAIR hyperintensity within the periventricular and deep white matter both cerebral hemispheres most consistent with chronic microvascular ischemic disease, advanced for age. No mass lesion or midline shift. No hydrocephalus. No extra-axial fluid collection. Vascular: Not well assessed on this limited examination. Skull and upper cervical spine: Not well assessed on this limited examination. Sinuses/Orbits: Grossly negative, although not well assessed on this limited exam. Other: None. IMPRESSION: 1. Negative brain MRI.  No acute intracranial infarct identified. 2. 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 Electronically Signed   By: Rise MuBenjamin  McClintock M.D.   On: 06/30/2019 21:27   Ct Head Code Stroke Wo Contrast  Result Date: 06/30/2019 CLINICAL DATA:  Code stroke.  Left facial droop and weakness. EXAM: CT HEAD WITHOUT CONTRAST TECHNIQUE: Contiguous axial images were obtained from the base of the skull through the vertex without intravenous contrast. COMPARISON:  None. FINDINGS: Brain: No evidence of acute infarction, hemorrhage, hydrocephalus, extra-axial collection or mass lesion/mass effect. Extensive chronic small vessel ischemic gliosis for  age in the white matter. Remote appearing lacunar infarct at the upper right putamen and internal capsule. Vascular: No hyperdense vessel or unexpected calcification. Skull: Normal. Negative for fracture or focal lesion. Sinuses/Orbits: No acute finding. Other: These results were communicated to Dr. Laurence SlateAroor at 8:45 pmon  7/6/2020by text page via the [facility] messaging system. ASPECTS [facility]([place] Stroke Program Early CT Score) - Ganglionic level infarction (caudate, lentiform nuclei, internal capsule, insula, M1-M3 cortex): 7 - Supraganglionic infarction (M4-M6 cortex): 3 Total score (0-10 with 10 being normal): 10 IMPRESSION: 1. Negative for intracranial hemorrhage or visible acute infarct. 2. Chronic small vessel ischemia greater than expected for age. Electronically Signed   By: Marnee SpringJonathon  Watts M.D.   On: 06/30/2019 20:47   2D echocardiogram  1. The left ventricle has normal systolic function, with an ejection fraction of 55-60%. The cavity size was normal. There is moderate asymmetric left ventricular hypertrophy. Left ventricular diastolic Doppler parameters are consistent with impaired relaxation.  2. The right ventricle has normal systolic function. The cavity was normal. There is no increase in right ventricular wall thickness.  3. No stenosis of the aortic valve.  4. The interatrial septum was not assessed.  PHYSICAL EXAM Pleasant middle-aged African-American male not in distress. . Afebrile. Head is nontraumatic. Neck is supple without bruit.    Cardiac exam no murmur or gallop. Lungs are clear to auscultation. Distal pulses are well felt. Neurological Exam ;  Awake  Alert oriented x 3. Normal speech and language.eye movements full without nystagmus.fundi were not visualized. Vision acuity and fields appear normal. Hearing is normal. Palatal movements are normal. Face symmetric. Tongue midline. Normal strength, tone, reflexes and coordination. Normal sensation. Gait deferred.  ASSESSMENT/PLAN Mr. Jonelle SidleBobby Knebel is a 53 y.o. male with history of tobacco abuse and alcohol abuse presenting following a fall while intoxicated with left sided weakness, left facial droop.   Alcohol intoxication  Alcohol abuse Possible right brain TIA unlikely  Code Stroke CT head No acute abnormality. Small vessel disease. ASPECTS  10.     CTA head & neck no LVO.  Mild atherosclerosis.  MRI no acute stroke.  Small vessel disease.  2D Echo EF 55 to 60%.  No source of embolus.  LDL 89  HgbA1c 6.1  Lovenox 40 mg subcu daily for VTE prophylaxis  No antithrombotic prior to admission, now on aspirin 325 mg daily.  No indication for ongoing antiplatelet at time of discharge given no stroke and unlikely TIA  ETOH abuse, intoxicated on arrival with alcohol level 281, advised to drink no more than 2 drink(s) a day  On CIWA protocol   Therapy recommendations: No therapy needs  Disposition: Return home  No neuro follow-up indicated  Hypertension  Home meds: None  Blood pressure elevated 160s to 180s . Okay for BP goal normotensive  Hyperlipidemia  Home meds:  No statin  Now on Lipitor 80 due to suspected stroke  LDL 89  Stop statin given no stroke diagnosis  Other Stroke Risk Factors  Cigarette smoker, advised to stop smoking  Obesity, Body mass index is 30.28 kg/m., recommend weight loss, diet and exercise as appropriate   Other Active Problems  Elevated LFTs  Hospital day # 0  I have personally obtained history,examined this patient, reviewed notes, independently viewed imaging studies, participated in medical decision making and plan of care.ROS completed by me personally and pertinent positives fully documented  I have made any additions or clarifications directly to the above note.  He presented with  a fall resulting in some left leg weakness likely due to trauma and the fall was related to alcohol intoxication and not a stroke.  Patient may be discharged home if medically stable.  No further stroke work-up is necessary.  Discussed with Dr. Wyline Copas.  Greater than 50% time during this 25-minute visit was spent on counseling and coordination of care about his presentation and answering questions  Antony Contras, [place] Pager: [phone] 07/01/2019 4:17  PM   To contact Stroke Continuity provider, please refer to [email or web address] After hours, contact General Neurology

## 2019-07-01 NOTE — Evaluation (Addendum)
Occupational Therapy Evaluation Patient Details Name: Zachary SidleBobby Puleo MRN: [id number] DOB: 08-Feb-1966 Today's Date: 07/01/2019    History of Present Illness 53 y.o. male with history of alcohol abuse was brought to the ER after patient was found to have left-sided weakness.  Patient states he had been drinking, had a fall around his house when neighbors called EMS and patient was taken back to his house inside.  At around 7:30 PM family noted that patient has left facial droop with left-sided weakness and left arm drift, unable to lift his left lower extremity.  CT head followed by CT angiogram of the head and neck did not show any large vessel occlusion; MRI brain was negative.     Clinical Impression   This 53 y/o male presents with the above. PTA pt reports he is independent with ADL, iADL and functional mobility. Pt performing functional mobility this session without AD and overall minA. Pt performing standing grooming ADL with close minguard-minA, with x1 LOB when turning to step away from sink requiring minA to correct. He currently requires minA for LB ADL, setup for seated UB ADL. Pt reports he lives at home with his mother who he provides some supervision/intermittent assist for. Reports he has a brother and sister who can stay/assist initially after discharge. Pt will benefit from continued acute OT services to progress his safety and independence with ADL and mobility, given pt's unsteadiness recommend he have initial 24hr assist after return home to reduce risk for further falls. Will follow.     Follow Up Recommendations  No OT follow up;Supervision/Assistance - 24 hour(24hr initially)    Equipment Recommendations  None recommended by OT           Precautions / Restrictions Precautions Precautions: Fall Restrictions Weight Bearing Restrictions: No      Mobility Bed Mobility Overal bed mobility: Needs Assistance Bed Mobility: Supine to Sit     Supine to sit: Min guard      General bed mobility comments: for lines/safety  Transfers Overall transfer level: Needs assistance Equipment used: None Transfers: Sit to/from Stand Sit to Stand: Min guard         General transfer comment: close minguard for safety and immediate standing balance    Balance Overall balance assessment: Needs assistance;History of Falls Sitting-balance support: Feet supported Sitting balance-Leahy Scale: Good     Standing balance support: No upper extremity supported;During functional activity Standing balance-Leahy Scale: Fair                             ADL either performed or assessed with clinical judgement   ADL Overall ADL's : Needs assistance/impaired Eating/Feeding: Modified independent;Sitting   Grooming: Oral care;Wash/dry hands;Minimal assistance;Standing;Min guard Grooming Details (indicate cue type and reason): intermittent close minguard however when pt attempting to turn and step away from sink pt with x1 LOB requiring minA to correct Upper Body Bathing: Set up;Min guard;Sitting   Lower Body Bathing: Minimal assistance;Sit to/from stand   Upper Body Dressing : Set up;Sitting Upper Body Dressing Details (indicate cue type and reason): donning gown Lower Body Dressing: Minimal assistance;Sit to/from stand   Toilet Transfer: Minimal assistance;Ambulation;Regular Teacher, adult educationToilet Toilet Transfer Details (indicate cue type and reason): simulated via transfer to recliner Toileting- Clothing Manipulation and Hygiene: Minimal assistance;Sit to/from stand       Functional mobility during ADLs: Minimal assistance General ADL Comments: pt with unsteady gait, x1 LOB requiring minA to correct  Vision Baseline Vision/History: No visual deficits Patient Visual Report: No change from baseline Vision Assessment?: No apparent visual deficits     Perception     Praxis      Pertinent Vitals/Pain Pain Assessment: Faces Faces Pain Scale: Hurts a little  bit Pain Location: bil knees from fall Pain Descriptors / Indicators: Discomfort;Guarding Pain Intervention(s): Monitored during session;Repositioned;Limited activity within patient's tolerance     Hand Dominance Right   Extremity/Trunk Assessment Upper Extremity Assessment Upper Extremity Assessment: Overall WFL for tasks assessed   Lower Extremity Assessment Lower Extremity Assessment: Defer to PT evaluation   Cervical / Trunk Assessment Cervical / Trunk Assessment: Normal   Communication Communication Communication: No difficulties   Cognition Arousal/Alertness: Awake/alert Behavior During Therapy: WFL for tasks assessed/performed Overall Cognitive Status: Within Functional Limits for tasks assessed                                     General Comments       Exercises     Shoulder Instructions      Home Living Family/patient expects to be discharged to:: Private residence Living Arrangements: Parent(mom) Available Help at Discharge: Family;Available PRN/intermittently Type of Home: Apartment Home Access: Stairs to enter Entrance Stairs-Number of Steps: 1 Entrance Stairs-Rails: None Home Layout: One level     Bathroom Shower/Tub: Producer, television/film/videoWalk-in shower   Bathroom Toilet: Standard     Home Equipment: Information systems managerhower seat      Lives With: Other (Comment)(caergiver for mother who is 55 y/o)    Prior Functioning/Environment Level of Independence: Independent        Comments: 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, works part-time at [employer]        OT Problem List: Decreased activity tolerance;Impaired balance (sitting and/or standing);Decreased safety awareness;Decreased knowledge of use of DME or AE      OT Treatment/Interventions: Self-care/ADL training;Therapeutic exercise;DME and/or AE instruction;Therapeutic activities;Patient/family education;Balance training;Cognitive remediation/compensation    OT Goals(Current goals can be found in  the care plan section) Acute Rehab OT Goals Patient Stated Goal: home OT Goal Formulation: With patient Time For Goal Achievement: 07/15/19 Potential to Achieve Goals: Good  OT Frequency: Min 2X/week   Barriers to D/C:            Co-evaluation PT/OT/SLP Co-Evaluation/Treatment: Yes Reason for Co-Treatment: Necessary to address cognition/behavior during functional activity;For patient/therapist safety;To address functional/ADL transfers          AM-PAC OT "6 Clicks" Daily Activity     Outcome Measure Help from another person eating meals?: None Help from another person taking care of personal grooming?: A Little Help from another person toileting, which includes using toliet, bedpan, or urinal?: A Little Help from another person bathing (including washing, rinsing, drying)?: A Little Help from another person to put on and taking off regular upper body clothing?: None Help from another person to put on and taking off regular lower body clothing?: A Little 6 Click Score: 20   End of Session Equipment Utilized During Treatment: Gait belt Nurse Communication: Mobility status  Activity Tolerance: Patient tolerated treatment well Patient left: in chair;with call bell/phone within reach;with chair alarm set  OT Visit Diagnosis: Unsteadiness on feet (R26.81);History of falling (Z91.81)                Time: 1125-1150 OT Time Calculation (min): 25 min Charges:  OT General Charges $OT Visit: 1 Visit OT Evaluation $  OT Eval Moderate Complexity: 1 Mod   Lou Cal, [place] Pager [phone] Office [phone]   Raymondo Band 07/01/2019, 1:07 PM

## 2019-07-01 NOTE — Evaluation (Signed)
Physical Therapy Evaluation Patient Details Name: Zachary Flores MRN: [id number] DOB: 08-12-1966 Today's Date: 07/01/2019   History of Present Illness  53 y.o. male with history of alcohol abuse was brought to the ER after patient was found to have left-sided weakness.  Patient states he had been drinking, had a fall around his house when neighbors called EMS and patient was taken back to his house inside.  At around 7:30 PM family noted that patient has left facial droop with left-sided weakness and left arm drift, unable to lift his left lower extremity.  CT head followed by CT angiogram of the head and neck did not show any large vessel occlusion; MRI brain was negative.    Clinical Impression  Pt admitted with above diagnosis. Pt currently with functional limitations due to the deficits listed below (see PT Problem List). At the time of PT eval ptw as able to perform transfers and ambulation with gross min guard assist to min assist for balance support and safety without an AD. Noted pt with several posterior LOB without an AD. Pt required increased time for processing occasionally and had difficulty multi-tasking. Pt will benefit from skilled PT to increase their independence and safety with mobility to allow discharge to the venue listed below.       Follow Up Recommendations No PT follow up;Supervision for mobility/OOB    Equipment Recommendations  None recommended by PT    Recommendations for Other Services       Precautions / Restrictions Precautions Precautions: Fall Restrictions Weight Bearing Restrictions: No      Mobility  Bed Mobility Overal bed mobility: Needs Assistance Bed Mobility: Supine to Sit     Supine to sit: Min guard     General bed mobility comments: for lines/safety  Transfers Overall transfer level: Needs assistance Equipment used: None Transfers: Sit to/from Stand Sit to Stand: Min guard         General transfer comment: close minguard for  safety and immediate standing balance. Noted several posterior LOB with static standing activity or going from static standing to initiating movement.   Ambulation/Gait Ambulation/Gait assistance: Min assist Gait Distance (Feet): 200 Feet Assistive device: None Gait Pattern/deviations: Step-through pattern;Decreased stride length;Trunk flexed Gait velocity: Decreased Gait velocity interpretation: <1.8 ft/sec, indicate of risk for recurrent falls General Gait Details: Occasional min assist for balance support and safety.   Stairs Stairs: Yes Stairs assistance: Min guard Stair Management: Two rails;Alternating pattern;Forwards Number of Stairs: 5 General stair comments: Pt demonstrated good reciprocal pattern on stairs.   Wheelchair Mobility    Modified Rankin (Stroke Patients Only)       Balance Overall balance assessment: Needs assistance;History of Falls Sitting-balance support: Feet supported Sitting balance-Leahy Scale: Good     Standing balance support: No upper extremity supported;During functional activity Standing balance-Leahy Scale: Fair                   Standardized Balance Assessment Standardized Balance Assessment : Dynamic Gait Index   Dynamic Gait Index Level Surface: Mild Impairment Change in Gait Speed: Mild Impairment Gait with Horizontal Head Turns: Moderate Impairment Gait with Vertical Head Turns: Moderate Impairment Steps: Mild Impairment       Pertinent Vitals/Pain Pain Assessment: Faces Faces Pain Scale: Hurts a little bit Pain Location: bil knees from fall Pain Descriptors / Indicators: Discomfort;Guarding Pain Intervention(s): Monitored during session;Repositioned;Limited activity within patient's tolerance    Home Living Family/patient expects to be discharged to:: Private residence Living Arrangements: Parent(mom) Available  Help at Discharge: Family;Available PRN/intermittently Type of Home: Apartment Home Access: Stairs to  enter Entrance Stairs-Rails: None Entrance Stairs-Number of Steps: 1 Home Layout: One level Home Equipment: Shower seat      Prior Function Level of Independence: Independent         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  LLE Sensation: WNL(with light touch testing)    Cervical / Trunk Assessment Cervical / Trunk Assessment: Normal  Communication   Communication: No difficulties  Cognition Arousal/Alertness: Awake/alert Behavior During Therapy: WFL for tasks assessed/performed Overall Cognitive Status: Slow processing                                        General Comments      Exercises     Assessment/Plan    PT Assessment Patient needs continued PT services  PT Problem List Decreased strength;Decreased activity tolerance;Decreased balance;Decreased mobility;Decreased coordination;Decreased knowledge of use of DME;Decreased cognition;Decreased safety awareness;Decreased knowledge of precautions;Cardiopulmonary status limiting activity;Pain       PT Treatment Interventions DME instruction;Gait training;Stair training;Functional mobility training;Therapeutic exercise;Therapeutic activities;Neuromuscular re-education;Patient/family education    PT Goals (Current goals can be found in the Care Plan section)  Acute Rehab PT Goals Patient Stated Goal: home PT Goal Formulation: With patient Potential to Achieve Goals: Good Additional Goals Additional Goal #1: Pt will score >19 on DGI to indicate low risk for falls    Frequency Min 5X/week   Barriers to discharge         Co-evaluation PT/OT/SLP Co-Evaluation/Treatment: Yes Reason for Co-Treatment: Necessary to address cognition/behavior during functional activity;To address functional/ADL transfers PT goals addressed during session: Mobility/safety with mobility;Balance         AM-PAC PT "6 Clicks" Mobility  Outcome Measure Help needed turning from your back to your side while in a flat bed without using bedrails?: None Help needed moving from lying on your back to sitting on the side of a flat bed without using bedrails?: None Help needed moving to and from a bed to a chair (including a wheelchair)?: A Little Help needed standing up from a chair using your arms (e.g., wheelchair or bedside chair)?: A Little Help needed to walk in hospital room?: A Little Help needed climbing 3-5 steps with a railing? : A Little 6 Click Score: 20    End of Session Equipment Utilized During Treatment: Gait belt Activity Tolerance: Patient tolerated treatment well Patient left: in chair;with call bell/phone within reach;with chair alarm set Nurse Communication: Mobility status PT Visit Diagnosis: Unsteadiness on feet (R26.81);Pain;Other symptoms and signs involving the nervous system (R29.898) Pain - Right/Left: (bilateral) Pain - part of body: Knee(abrasions)    Time: 1125-1150 PT Time Calculation (min) (ACUTE ONLY): 25 min   Charges:   PT Evaluation $PT Eval Moderate Complexity: 1 Mod          Conni SlipperLaura Kainalu Heggs, PT, DPT Acute Rehabilitation Services Pager: [phone] Office: [phone]   Marylynn PearsonLaura D Najib Colmenares 07/01/2019, 1:28 PM

## 2019-07-01 NOTE — Progress Notes (Signed)
  Echocardiogram 2D Echocardiogram has been performed.  Zachary Flores 07/01/2019, 8:58 AM

## 2019-07-01 NOTE — ED Notes (Signed)
Sister- Marnette Burgess, [phone] would like an update

## 2019-07-01 NOTE — Progress Notes (Signed)
Pt discharge education provided at bedside  Pt verbalizes understanding Pt has all belongings including prescriptions dropped off by [facility]  Pt IV removed, catheter intact and pt telemetry removed  [employer] called  Pt discharged via wheelchair with RN  Taxi pass handed to driver

## 2019-07-01 NOTE — Discharge Summary (Addendum)
Physician Discharge Summary  Zachary Flores ZOX:[id number] DOB: [birth year] DOA: 06/30/2019  PCP: Patient, No Pcp Per  Admit date: 06/30/2019 Discharge date: 07/01/2019  Admitted From: Home Disposition:  Home  Recommendations for Outpatient Follow-up:  1. Follow up with PCP in 1-2 weeks 2. Recommend follow up hepatitis panel  Discharge Condition:Stable CODE STATUS:Full Diet recommendation: Heart healthy   Brief/Interim Summary: 53 y.o. male with history of alcohol abuse was brought to the ER after patient was found to have left-sided weakness.  Patient states he started drinking alcohol yesterday around 11 AM and around 7 PM he had a fall around his house when neighbors called EMS and patient was taken back to his house inside.  At around 7:30 PM family noted that patient has left facial droop with left-sided weakness and left arm drift.  Unable to lift his left lower extremity.  Patient was brought to the ER.  ED Course: In the ER initially was found to have left facial droop and left-sided weakness which soon improved.  CT head followed by CT angiogram of the head and neck did not show any large vessel occlusion per MRI brain was negative.  Neurologist on-call was consulted and patient is being admitted for TIA.  Patient had passed swallow.  Patient's labs show AST 87 ALT 48 creatinine 1.9 potassium 3.4 sodium 133 alcohol level was 281.  COVID-19 negative.  EKG was showing sinus tachycardia nonspecific T wave changes.  Patient admitted for possible TIA.  Discharge Diagnoses:  Principal Problem:   Alcohol abuse with intoxication (HCC) Active Problems:   Elevated LFTs  1. Weakness, CVA ruled out 1. appreciate neurology consult.   2. MRI reviewed, neg 3. 2d echo reviewed, normal LVEF 4. Discussed case with Dr. Pearlean BrownieSethi. OK for discharge today 2. Alcohol abuse, likely etiology of presenting symptoms 1. Placed patient on CIWA protocol.   2. No evidence of withdrawals 3. Elevated LFTs  likely from alcohol abuse.   1. Follow up acute hepatitis panel as outpatinet 4. HTN 1. BP poorly controlled 2. Will start metoprolol 25mg  bid and lisinopril 5mg  3. Recommend repeat bmet in 1-2 weeks and to continue to titrate bp meds as needed 5. Hypokalemia 1. Replaced 2. Mg reviewed, normal 6. Fall 1. B knee injury on exam 2. B knee xrays obtained, reviewed, negative for fracture  Discharge Instructions   Allergies as of 07/01/2019   No Known Allergies     Medication List    TAKE these medications   lisinopril 5 MG tablet Commonly known as: ZESTRIL Take 1 tablet (5 mg total) by mouth daily.   metoprolol tartrate 25 MG tablet Commonly known as: LOPRESSOR Take 1 tablet (25 mg total) by mouth 2 (two) times daily.      Follow-up Information     [facility] Follow up.   Why: Use this location for your pharmacy needs Contact information: [address] [place] [id number] [phone]         No Known Allergies  Consultations:  Neurology  Procedures/Studies: Ct Angio Head W Or Wo Contrast  Result Date: 06/30/2019 CLINICAL DATA:  Stroke syndrome EXAM: CT ANGIOGRAPHY HEAD AND NECK TECHNIQUE: Multidetector CT imaging of the head and neck was performed using the standard protocol during bolus administration of intravenous contrast. Multiplanar CT image reconstructions and MIPs were obtained to evaluate the vascular anatomy. Carotid stenosis measurements (when applicable) are obtained utilizing NASCET criteria, using the distal internal carotid diameter as the denominator. CONTRAST:  75 cc Omnipaque 350 intravenous COMPARISON:  Noncontrast head CT earlier today FINDINGS: CTA NECK FINDINGS Aortic arch: Negative Right carotid system: Vessels are smooth and widely patent. No atheromatous changes Left carotid system: Mild mainly calcified plaque about the bifurcation without stenosis or ulceration. Vertebral arteries: Left  dominant vertebral artery. Both vertebral arteries are smooth and diffusely patent. No proximal subclavian stenosis. Skeleton: No acute or aggressive finding.  Dental caries Other neck: Negative Upper chest: Remote injury to the left pectoralis major with partially visualized coarse calcification. Review of the MIP images confirms the above findings CTA HEAD FINDINGS Anterior circulation: Mild calcification of the carotid siphons. No flow limiting stenosis or major branch occlusion. Negative for aneurysm Posterior circulation: Left dominant vertebral artery. The vertebral and basilar arteries are smooth and widely patent. Hypoplastic P1 segments Venous sinuses: Patent Anatomic variants: As above Delayed phase: Not obtained Review of the MIP images confirms the above findings IMPRESSION: 1. Negative for large vessel occlusion or other emergent finding. 2. Mild atherosclerosis without significant stenosis. Electronically Signed   By: Marnee SpringJonathon  Watts M.D.   On: 06/30/2019 21:02   Ct Angio Neck W Or Wo Contrast  Result Date: 06/30/2019 CLINICAL DATA:  Stroke syndrome EXAM: CT ANGIOGRAPHY HEAD AND NECK TECHNIQUE: Multidetector CT imaging of the head and neck was performed using the standard protocol during bolus administration of intravenous contrast. Multiplanar CT image reconstructions and MIPs were obtained to evaluate the vascular anatomy. Carotid stenosis measurements (when applicable) are obtained utilizing NASCET criteria, using the distal internal carotid diameter as the denominator. CONTRAST:  75 cc Omnipaque 350 intravenous COMPARISON:  Noncontrast head CT earlier today FINDINGS: CTA NECK FINDINGS Aortic arch: Negative Right carotid system: Vessels are smooth and widely patent. No atheromatous changes Left carotid system: Mild mainly calcified plaque about the bifurcation without stenosis or ulceration. Vertebral arteries: Left dominant vertebral artery. Both vertebral arteries are smooth and diffusely  patent. No proximal subclavian stenosis. Skeleton: No acute or aggressive finding.  Dental caries Other neck: Negative Upper chest: Remote injury to the left pectoralis major with partially visualized coarse calcification. Review of the MIP images confirms the above findings CTA HEAD FINDINGS Anterior circulation: Mild calcification of the carotid siphons. No flow limiting stenosis or major branch occlusion. Negative for aneurysm Posterior circulation: Left dominant vertebral artery. The vertebral and basilar arteries are smooth and widely patent. Hypoplastic P1 segments Venous sinuses: Patent Anatomic variants: As above Delayed phase: Not obtained Review of the MIP images confirms the above findings IMPRESSION: 1. Negative for large vessel occlusion or other emergent finding. 2. Mild atherosclerosis without significant stenosis. Electronically Signed   By: Marnee SpringJonathon  Watts M.D.   On: 06/30/2019 21:02   Mr Brain Wo Contrast  Result Date: 06/30/2019 CLINICAL DATA:  Initial evaluation for acute stroke. EXAM: MRI HEAD WITHOUT CONTRAST TECHNIQUE: Multiplanar, multiecho pulse sequences of the brain and surrounding structures were obtained without intravenous contrast. COMPARISON:  Prior CTA from earlier same day. FINDINGS: Brain: Limited exam with DWI, gradient echo imaging, and FLAIR sequence only was performed. Images are degraded by motion artifact. Diffusion-weighted imaging demonstrates no evidence for acute or subacute ischemia. Gray-white matter differentiation maintained. No evidence for acute intracranial hemorrhage. Few scattered chronic micro hemorrhages noted involving the brainstem. Underlying age-related cerebral atrophy. Confluent T2/FLAIR hyperintensity within the periventricular and deep white matter both cerebral hemispheres most consistent with chronic microvascular ischemic disease, advanced for age. No mass lesion or midline shift. No hydrocephalus. No extra-axial fluid collection. Vascular: Not  well assessed on this limited examination. Skull and upper cervical spine: Not well assessed on this limited examination. Sinuses/Orbits: Grossly negative, although not well assessed on this limited exam. Other: None. IMPRESSION: 1. Negative brain MRI.  No acute intracranial infarct identified. 2. 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 Electronically Signed   By: Jeannine Boga M.D.   On: 06/30/2019 21:27   Ct Head Code Stroke Wo Contrast  Result Date: 06/30/2019 CLINICAL DATA:  Code stroke.  Left facial droop and weakness. EXAM: CT HEAD WITHOUT CONTRAST TECHNIQUE: Contiguous axial images were obtained from the base of the skull through the vertex without intravenous contrast. COMPARISON:  None. FINDINGS: Brain: No evidence of acute infarction, hemorrhage, hydrocephalus, extra-axial collection or mass lesion/mass effect. Extensive chronic small vessel ischemic gliosis for age in the white matter. Remote appearing lacunar infarct at the upper right putamen and internal capsule. Vascular: No hyperdense vessel or unexpected calcification. Skull: Normal. Negative for fracture or focal lesion. Sinuses/Orbits: No acute finding. Other: These results were communicated to Dr. Lorraine Lax at 8:45 pmon 7/6/2020by text page via the [facility] messaging system. ASPECTS [facility] Stroke Program Early CT Score) - Ganglionic level infarction (caudate, lentiform nuclei, internal capsule, insula, M1-M3 cortex): 7 - Supraganglionic infarction (M4-M6 cortex): 3 Total score (0-10 with 10 being normal): 10 IMPRESSION: 1. Negative for intracranial hemorrhage or visible acute infarct. 2. Chronic small vessel ischemia greater than expected for age. Electronically Signed   By: Monte Fantasia M.D.   On: 06/30/2019 20:47    Subjective: Eager to go home  Discharge Exam: Vitals:   07/01/19 0632 07/01/19 0858  BP: (!) 158/98 (!) 177/94  Pulse:  93  Resp: 20 17  Temp: 98 F (36.7 C)   SpO2: 98% 99%   Vitals:    07/01/19 0225 07/01/19 0335 07/01/19 0632 07/01/19 0858  BP: (!) 162/108 (!) 177/117 (!) 158/98 (!) 177/94  Pulse: 98 (!) 106  93  Resp: 20 20 20 17   Temp: 98.3 F (36.8 C) 98.2 F (36.8 C) 98 F (36.7 C)   TempSrc: Axillary Oral Axillary   SpO2: 100% 95% 98% 99%  Weight:      Height:        General: Pt is alert, awake, not in acute distress Cardiovascular: RRR, S1/S2 +, no rubs, no gallops Respiratory: CTA bilaterally, no wheezing, no rhonchi Abdominal: Soft, NT, ND, bowel sounds + Extremities: no edema, no cyanosis   The results of significant diagnostics from this hospitalization (including imaging, microbiology, ancillary and laboratory) are listed below for reference.     Microbiology: Recent Results (from the past 240 hour(s))  SARS Coronavirus 2 (CEPHEID - Performed in [place] hospital lab), Hosp Order     Status: None   Collection Time: 06/30/19  9:42 PM   Specimen: Nasopharyngeal Swab  Result Value Ref Range Status   SARS Coronavirus 2 NEGATIVE NEGATIVE Final    Comment: (NOTE) If result is NEGATIVE SARS-CoV-2 target nucleic acids are NOT DETECTED. The SARS-CoV-2 RNA is generally detectable in upper and lower  respiratory specimens during the acute phase of infection. The lowest  concentration of SARS-CoV-2 viral copies this assay can detect is 250  copies / mL. A negative result does not preclude SARS-CoV-2 infection  and should not be used as the sole basis for treatment or other  patient management decisions.  A negative result may occur with  improper specimen collection / handling, submission of specimen other  than nasopharyngeal swab, presence of viral mutation(s)  within the  areas targeted by this assay, and inadequate number of viral copies  (<250 copies / mL). A negative result must be combined with clinical  observations, patient history, and epidemiological information. If result is POSITIVE SARS-CoV-2 target nucleic acids are DETECTED. The  SARS-CoV-2 RNA is generally detectable in upper and lower  respiratory specimens dur ing the acute phase of infection.  Positive  results are indicative of active infection with SARS-CoV-2.  Clinical  correlation with patient history and other diagnostic information is  necessary to determine patient infection status.  Positive results do  not rule out bacterial infection or co-infection with other viruses. If result is PRESUMPTIVE POSTIVE SARS-CoV-2 nucleic acids MAY BE PRESENT.   A presumptive positive result was obtained on the submitted specimen  and confirmed on repeat testing.  While 2019 novel coronavirus  (SARS-CoV-2) nucleic acids may be present in the submitted sample  additional confirmatory testing may be necessary for epidemiological  and / or clinical management purposes  to differentiate between  SARS-CoV-2 and other Sarbecovirus currently known to infect humans.  If clinically indicated additional testing with an alternate test  methodology [phone]) is advised. The SARS-CoV-2 RNA is generally  detectable in upper and lower respiratory sp ecimens during the acute  phase of infection. The expected result is Negative. Fact Sheet for Patients:  BoilerBrush.com.cyhttps://www.fda.gov/media/136312/download Fact Sheet for Healthcare Providers: https://pope.com/https://www.fda.gov/media/136313/download This test is not yet approved or cleared by the [place] FDA and has been authorized for detection and/or diagnosis of SARS-CoV-2 by FDA under an Emergency Use Authorization (EUA).  This EUA will remain in effect (meaning this test can be used) for the duration of the COVID-19 declaration under Section 564(b)(1) of the Act, 21 U.S.C. section 360bbb-3(b)(1), unless the authorization is terminated or revoked sooner. Performed at [facility], [address] [place], [place] [id number]      Labs: BNP (last 3 results) No results for input(s): BNP in the last 8760 hours. Basic Metabolic  Panel: Recent Labs  Lab 06/30/19 2028 06/30/19 2035 07/01/19 0742  NA 133* 135 140  K 3.4* 3.5 3.9  CL 99 101 106  CO2 20*  --  24  GLUCOSE 122* 120* 104*  BUN 5* 4* <5*  CREATININE 0.92 1.40* 0.95  CALCIUM 8.7*  --  9.1  MG  --   --  2.0   Liver Function Tests: Recent Labs  Lab 06/30/19 2028 07/01/19 0742  AST 87* 97*  ALT 48* 53*  ALKPHOS 79 82  BILITOT 0.5 0.6  PROT 7.1 7.0  ALBUMIN 4.4 4.1   No results for input(s): LIPASE, AMYLASE in the last 168 hours. No results for input(s): AMMONIA in the last 168 hours. CBC: Recent Labs  Lab 06/30/19 2028 06/30/19 2035 07/01/19 0742  WBC 5.7  --  8.0  NEUTROABS 3.8  --   --   HGB 14.8 16.7 14.6  HCT 44.8 49.0 43.5  MCV 89.8  --  89.0  PLT 313  --  342   Cardiac Enzymes: No results for input(s): CKTOTAL, CKMB, CKMBINDEX, TROPONINI in the last 168 hours. BNP: Invalid input(s): POCBNP CBG: Recent Labs  Lab 06/30/19 2027  GLUCAP 115*   D-Dimer No results for input(s): DDIMER in the last 72 hours. Hgb A1c Recent Labs    07/01/19 0742  HGBA1C 6.1*   Lipid Profile Recent Labs    07/01/19 0742  CHOL 170  HDL 54  LDLCALC 89  TRIG 135  CHOLHDL 3.1  Thyroid function studies No results for input(s): TSH, T4TOTAL, T3FREE, THYROIDAB in the last 72 hours.  Invalid input(s): FREET3 Anemia work up No results for input(s): VITAMINB12, FOLATE, FERRITIN, TIBC, IRON, RETICCTPCT in the last 72 hours. Urinalysis No results found for: COLORURINE, APPEARANCEUR, LABSPEC, PHURINE, GLUCOSEU, HGBUR, BILIRUBINUR, KETONESUR, PROTEINUR, UROBILINOGEN, NITRITE, LEUKOCYTESUR Sepsis Labs Invalid input(s): PROCALCITONIN,  WBC,  LACTICIDVEN Microbiology Recent Results (from the past 240 hour(s))  SARS Coronavirus 2 (CEPHEID - Performed in [facility] hospital lab), Hosp Order     Status: None   Collection Time: 06/30/19  9:42 PM   Specimen: Nasopharyngeal Swab  Result Value Ref Range Status   SARS Coronavirus 2 NEGATIVE  NEGATIVE Final    Comment: (NOTE) If result is NEGATIVE SARS-CoV-2 target nucleic acids are NOT DETECTED. The SARS-CoV-2 RNA is generally detectable in upper and lower  respiratory specimens during the acute phase of infection. The lowest  concentration of SARS-CoV-2 viral copies this assay can detect is 250  copies / mL. A negative result does not preclude SARS-CoV-2 infection  and should not be used as the sole basis for treatment or other  patient management decisions.  A negative result may occur with  improper specimen collection / handling, submission of specimen other  than nasopharyngeal swab, presence of viral mutation(s) within the  areas targeted by this assay, and inadequate number of viral copies  (<250 copies / mL). A negative result must be combined with clinical  observations, patient history, and epidemiological information. If result is POSITIVE SARS-CoV-2 target nucleic acids are DETECTED. The SARS-CoV-2 RNA is generally detectable in upper and lower  respiratory specimens dur ing the acute phase of infection.  Positive  results are indicative of active infection with SARS-CoV-2.  Clinical  correlation with patient history and other diagnostic information is  necessary to determine patient infection status.  Positive results do  not rule out bacterial infection or co-infection with other viruses. If result is PRESUMPTIVE POSTIVE SARS-CoV-2 nucleic acids MAY BE PRESENT.   A presumptive positive result was obtained on the submitted specimen  and confirmed on repeat testing.  While 2019 novel coronavirus  (SARS-CoV-2) nucleic acids may be present in the submitted sample  additional confirmatory testing may be necessary for epidemiological  and / or clinical management purposes  to differentiate between  SARS-CoV-2 and other Sarbecovirus currently known to infect humans.  If clinically indicated additional testing with an alternate test  methodology [phone]) is  advised. The SARS-CoV-2 RNA is generally  detectable in upper and lower respiratory sp ecimens during the acute  phase of infection. The expected result is Negative. Fact Sheet for Patients:  BoilerBrush.com.cy Fact Sheet for Healthcare Providers: https://pope.com/ This test is not yet approved or cleared by the [place] FDA and has been authorized for detection and/or diagnosis of SARS-CoV-2 by FDA under an Emergency Use Authorization (EUA).  This EUA will remain in effect (meaning this test can be used) for the duration of the COVID-19 declaration under Section 564(b)(1) of the Act, 21 U.S.C. section 360bbb-3(b)(1), unless the authorization is terminated or revoked sooner. Performed at [facility], [address], [place], [place] [address]    Time spent: 30 min  SIGNED:   Rickey Barbara, MD  [facility] 07/01/2019, 1:28 PM  If 7PM-7AM, please contact night-coverage

## 2019-07-01 NOTE — TOC Initial Note (Signed)
Transition of Care [facility]) - Initial/Assessment Note    Patient Details  Name: Zachary Flores MRN: [id number] Date of Birth: 03/07/1966  [facility] CM/SW Contact:    Pollie Friar, RN Phone Number: 07/01/2019, 4:06 PM  Clinical Narrative:                 Pt is without insurance and no PCP. He is interested in being set up with one of the [facility].   Expected Discharge Plan: Home/Self Care Barriers to Discharge: Inadequate or no insurance   Patient Goals and CMS Choice        Expected Discharge Plan and Services Expected Discharge Plan: Home/Self Care   Discharge Planning Services: CM Consult     Expected Discharge Date: 07/01/19                                    Prior Living Arrangements/Services   Lives with:: Relatives(sister and mother) Patient language and need for interpreter reviewed:: Yes(no needs) Do you feel safe going back to the place where you live?: Yes      Need for Family Participation in Patient Care: No (Comment) Care giver support system in place?: Yes (comment) Current home services: DME(walker, shower seat, cane) Criminal Activity/Legal Involvement Pertinent to Current Situation/Hospitalization: No - Comment as needed  Activities of Daily Living Home Assistive Devices/Equipment: None ADL Screening (condition at time of admission) Patient's cognitive ability adequate to safely complete daily activities?: No Is the patient deaf or have difficulty hearing?: No Does the patient have difficulty seeing, even when wearing glasses/contacts?: No Does the patient have difficulty concentrating, remembering, or making decisions?: Yes Patient able to express need for assistance with ADLs?: Yes Does the patient have difficulty dressing or bathing?: No Independently performs ADLs?: Yes (appropriate for developmental age) Does the patient have difficulty walking or climbing stairs?: No Weakness of Legs: None Weakness of Arms/Hands:  None  Permission Sought/Granted                  Emotional Assessment Appearance:: Appears stated age Attitude/Demeanor/Rapport: Engaged Affect (typically observed): Accepting, Pleasant Orientation: : Oriented to Self, Oriented to Place, Oriented to  Time, Oriented to Situation   Psych Involvement: No (comment)  Admission diagnosis:  TIA (transient ischemic attack) [M35.3] Alcoholic intoxication with complication [facility]) [I14.431] Patient Active Problem List   Diagnosis Date Noted  . TIA (transient ischemic attack) 06/30/2019  . Alcohol abuse with intoxication ([place]) 06/30/2019  . Elevated LFTs 06/30/2019   PCP:  Patient, No Pcp Per Pharmacy:   [facility] [place] [address] Phone: [phone] Fax: [place], [place] [address] [place] [address] Phone: [phone] Fax: [phone]     Social Determinants of Health (SDOH) Interventions    Readmission Risk Interventions No flowsheet data found.

## 2019-07-02 LAB — HEPATITIS PANEL, ACUTE
HCV Ab: <0.1 s/co ratio (ref 0.0–0.9)
Hep A IgM: NEGATIVE
Hep B C IgM: NEGATIVE
Hepatitis B Surface Ag: NEGATIVE

## 2019-07-08 ENCOUNTER — Ambulatory Visit: Payer: Self-pay | Admitting: Family Medicine

## 2020-09-06 ENCOUNTER — Emergency Department (HOSPITAL_COMMUNITY): Payer: Medicaid Other

## 2020-09-06 ENCOUNTER — Inpatient Hospital Stay (HOSPITAL_COMMUNITY)
Admission: EM | Admit: 2020-09-06 | Discharge: 2020-09-15 | DRG: 064 | Disposition: A | Discharge status: Rehabilitation Facility | Payer: Medicaid Other | Attending: Internal Medicine | Admitting: Internal Medicine

## 2020-09-06 ENCOUNTER — Other Ambulatory Visit: Payer: Self-pay

## 2020-09-06 DIAGNOSIS — G92 Toxic encephalopathy: Secondary | ICD-10-CM | POA: Diagnosis present

## 2020-09-06 DIAGNOSIS — R531 Weakness: Secondary | ICD-10-CM

## 2020-09-06 DIAGNOSIS — R29708 NIHSS score 8: Secondary | ICD-10-CM | POA: Diagnosis present

## 2020-09-06 DIAGNOSIS — I739 Peripheral vascular disease, unspecified: Secondary | ICD-10-CM | POA: Diagnosis present

## 2020-09-06 DIAGNOSIS — Z9114 Patient's other noncompliance with medication regimen: Secondary | ICD-10-CM | POA: Diagnosis not present

## 2020-09-06 DIAGNOSIS — F10139 Alcohol abuse with withdrawal, unspecified: Secondary | ICD-10-CM | POA: Diagnosis not present

## 2020-09-06 DIAGNOSIS — E669 Obesity, unspecified: Secondary | ICD-10-CM | POA: Diagnosis present

## 2020-09-06 DIAGNOSIS — F1910 Other psychoactive substance abuse, uncomplicated: Secondary | ICD-10-CM

## 2020-09-06 DIAGNOSIS — Z716 Tobacco abuse counseling: Secondary | ICD-10-CM | POA: Diagnosis not present

## 2020-09-06 DIAGNOSIS — Z23 Encounter for immunization: Secondary | ICD-10-CM

## 2020-09-06 DIAGNOSIS — K5901 Slow transit constipation: Secondary | ICD-10-CM | POA: Diagnosis not present

## 2020-09-06 DIAGNOSIS — Y900 Blood alcohol level of less than 20 mg/100 ml: Secondary | ICD-10-CM | POA: Diagnosis present

## 2020-09-06 DIAGNOSIS — F17210 Nicotine dependence, cigarettes, uncomplicated: Secondary | ICD-10-CM | POA: Diagnosis present

## 2020-09-06 DIAGNOSIS — Z7151 Drug abuse counseling and surveillance of drug abuser: Secondary | ICD-10-CM

## 2020-09-06 DIAGNOSIS — I69198 Other sequelae of nontraumatic intracerebral hemorrhage: Secondary | ICD-10-CM | POA: Diagnosis not present

## 2020-09-06 DIAGNOSIS — G8194 Hemiplegia, unspecified affecting left nondominant side: Secondary | ICD-10-CM | POA: Diagnosis present

## 2020-09-06 DIAGNOSIS — R0989 Other specified symptoms and signs involving the circulatory and respiratory systems: Secondary | ICD-10-CM | POA: Diagnosis not present

## 2020-09-06 DIAGNOSIS — R1310 Dysphagia, unspecified: Secondary | ICD-10-CM | POA: Diagnosis not present

## 2020-09-06 DIAGNOSIS — R471 Dysarthria and anarthria: Secondary | ICD-10-CM | POA: Diagnosis present

## 2020-09-06 DIAGNOSIS — A084 Viral intestinal infection, unspecified: Secondary | ICD-10-CM | POA: Diagnosis not present

## 2020-09-06 DIAGNOSIS — R339 Retention of urine, unspecified: Secondary | ICD-10-CM | POA: Diagnosis not present

## 2020-09-06 DIAGNOSIS — Z8679 Personal history of other diseases of the circulatory system: Secondary | ICD-10-CM | POA: Diagnosis not present

## 2020-09-06 DIAGNOSIS — G9341 Metabolic encephalopathy: Secondary | ICD-10-CM | POA: Diagnosis not present

## 2020-09-06 DIAGNOSIS — Z6830 Body mass index (BMI) 30.0-30.9, adult: Secondary | ICD-10-CM

## 2020-09-06 DIAGNOSIS — I959 Hypotension, unspecified: Secondary | ICD-10-CM | POA: Diagnosis not present

## 2020-09-06 DIAGNOSIS — I10 Essential (primary) hypertension: Secondary | ICD-10-CM

## 2020-09-06 DIAGNOSIS — I619 Nontraumatic intracerebral hemorrhage, unspecified: Secondary | ICD-10-CM | POA: Diagnosis present

## 2020-09-06 DIAGNOSIS — R1312 Dysphagia, oropharyngeal phase: Secondary | ICD-10-CM | POA: Diagnosis present

## 2020-09-06 DIAGNOSIS — F10939 Alcohol use, unspecified with withdrawal, unspecified: Secondary | ICD-10-CM | POA: Diagnosis present

## 2020-09-06 DIAGNOSIS — I161 Hypertensive emergency: Secondary | ICD-10-CM | POA: Diagnosis present

## 2020-09-06 DIAGNOSIS — I952 Hypotension due to drugs: Secondary | ICD-10-CM | POA: Diagnosis not present

## 2020-09-06 DIAGNOSIS — H532 Diplopia: Secondary | ICD-10-CM | POA: Diagnosis not present

## 2020-09-06 DIAGNOSIS — E785 Hyperlipidemia, unspecified: Secondary | ICD-10-CM | POA: Diagnosis present

## 2020-09-06 DIAGNOSIS — I69122 Dysarthria following nontraumatic intracerebral hemorrhage: Secondary | ICD-10-CM | POA: Diagnosis not present

## 2020-09-06 DIAGNOSIS — F1410 Cocaine abuse, uncomplicated: Secondary | ICD-10-CM | POA: Diagnosis present

## 2020-09-06 DIAGNOSIS — I6350 Cerebral infarction due to unspecified occlusion or stenosis of unspecified cerebral artery: Secondary | ICD-10-CM | POA: Diagnosis not present

## 2020-09-06 DIAGNOSIS — G441 Vascular headache, not elsewhere classified: Secondary | ICD-10-CM | POA: Diagnosis not present

## 2020-09-06 DIAGNOSIS — F1491 Cocaine use, unspecified, in remission: Secondary | ICD-10-CM | POA: Diagnosis present

## 2020-09-06 DIAGNOSIS — F10239 Alcohol dependence with withdrawal, unspecified: Secondary | ICD-10-CM | POA: Diagnosis not present

## 2020-09-06 DIAGNOSIS — I69191 Dysphagia following nontraumatic intracerebral hemorrhage: Secondary | ICD-10-CM | POA: Diagnosis not present

## 2020-09-06 DIAGNOSIS — L89302 Pressure ulcer of unspecified buttock, stage 2: Secondary | ICD-10-CM | POA: Diagnosis not present

## 2020-09-06 DIAGNOSIS — I69154 Hemiplegia and hemiparesis following nontraumatic intracerebral hemorrhage affecting left non-dominant side: Secondary | ICD-10-CM | POA: Diagnosis not present

## 2020-09-06 DIAGNOSIS — R29810 Facial weakness: Secondary | ICD-10-CM | POA: Diagnosis present

## 2020-09-06 DIAGNOSIS — R32 Unspecified urinary incontinence: Secondary | ICD-10-CM | POA: Diagnosis not present

## 2020-09-06 DIAGNOSIS — I613 Nontraumatic intracerebral hemorrhage in brain stem: Principal | ICD-10-CM | POA: Diagnosis present

## 2020-09-06 DIAGNOSIS — H518 Other specified disorders of binocular movement: Secondary | ICD-10-CM | POA: Diagnosis present

## 2020-09-06 DIAGNOSIS — L89152 Pressure ulcer of sacral region, stage 2: Secondary | ICD-10-CM | POA: Diagnosis not present

## 2020-09-06 DIAGNOSIS — Z781 Physical restraint status: Secondary | ICD-10-CM

## 2020-09-06 DIAGNOSIS — R200 Anesthesia of skin: Secondary | ICD-10-CM

## 2020-09-06 DIAGNOSIS — Z20822 Contact with and (suspected) exposure to covid-19: Secondary | ICD-10-CM | POA: Diagnosis present

## 2020-09-06 DIAGNOSIS — R001 Bradycardia, unspecified: Secondary | ICD-10-CM | POA: Diagnosis not present

## 2020-09-06 DIAGNOSIS — D75839 Thrombocytosis, unspecified: Secondary | ICD-10-CM | POA: Diagnosis not present

## 2020-09-06 DIAGNOSIS — Z79899 Other long term (current) drug therapy: Secondary | ICD-10-CM | POA: Diagnosis not present

## 2020-09-06 DIAGNOSIS — Z87898 Personal history of other specified conditions: Secondary | ICD-10-CM | POA: Diagnosis present

## 2020-09-06 DIAGNOSIS — I69391 Dysphagia following cerebral infarction: Secondary | ICD-10-CM

## 2020-09-06 DIAGNOSIS — R4781 Slurred speech: Secondary | ICD-10-CM | POA: Diagnosis not present

## 2020-09-06 DIAGNOSIS — A020 Salmonella enteritis: Secondary | ICD-10-CM | POA: Diagnosis not present

## 2020-09-06 DIAGNOSIS — Z713 Dietary counseling and surveillance: Secondary | ICD-10-CM

## 2020-09-06 DIAGNOSIS — R739 Hyperglycemia, unspecified: Secondary | ICD-10-CM | POA: Diagnosis not present

## 2020-09-06 DIAGNOSIS — R030 Elevated blood-pressure reading, without diagnosis of hypertension: Secondary | ICD-10-CM | POA: Diagnosis not present

## 2020-09-06 DIAGNOSIS — I69354 Hemiplegia and hemiparesis following cerebral infarction affecting left non-dominant side: Secondary | ICD-10-CM | POA: Diagnosis not present

## 2020-09-06 DIAGNOSIS — M79602 Pain in left arm: Secondary | ICD-10-CM | POA: Diagnosis not present

## 2020-09-06 LAB — CBC
HCT: 46.5 % (ref 39.0–52.0)
Hemoglobin: 14.9 g/dL (ref 13.0–17.0)
MCH: 28.9 pg (ref 26.0–34.0)
MCHC: 32 g/dL (ref 30.0–36.0)
MCV: 90.3 fL (ref 80.0–100.0)
Platelets: 413 10*3/uL — ABNORMAL HIGH (ref 150–400)
RBC: 5.15 MIL/uL (ref 4.22–5.81)
RDW: 13.7 % (ref 11.5–15.5)
WBC: 8.2 10*3/uL (ref 4.0–10.5)
nRBC: 0 % (ref 0.0–0.2)

## 2020-09-06 LAB — I-STAT CHEM 8, ED
BUN: 7 mg/dL (ref 6–20)
Calcium, Ion: 1.05 mmol/L — ABNORMAL LOW (ref 1.15–1.40)
Chloride: 101 mmol/L (ref 98–111)
Creatinine, Ser: 0.8 mg/dL (ref 0.61–1.24)
Glucose, Bld: 162 mg/dL — ABNORMAL HIGH (ref 70–99)
HCT: 48 % (ref 39.0–52.0)
Hemoglobin: 16.3 g/dL (ref 13.0–17.0)
Potassium: 3.8 mmol/L (ref 3.5–5.1)
Sodium: 136 mmol/L (ref 135–145)
TCO2: 27 mmol/L (ref 22–32)

## 2020-09-06 LAB — COMPREHENSIVE METABOLIC PANEL
ALT: 45 U/L — ABNORMAL HIGH (ref 0–44)
AST: 61 U/L — ABNORMAL HIGH (ref 15–41)
Albumin: 4.2 g/dL (ref 3.5–5.0)
Alkaline Phosphatase: 73 U/L (ref 38–126)
Anion gap: 12 (ref 5–15)
BUN: 7 mg/dL (ref 6–20)
CO2: 25 mmol/L (ref 22–32)
Calcium: 9.2 mg/dL (ref 8.9–10.3)
Chloride: 99 mmol/L (ref 98–111)
Creatinine, Ser: 0.97 mg/dL (ref 0.61–1.24)
GFR calc Af Amer: >60 mL/min (ref >60)
GFR calc non Af Amer: >60 mL/min (ref >60)
Glucose, Bld: 188 mg/dL — ABNORMAL HIGH (ref 70–99)
Potassium: 4.9 mmol/L (ref 3.5–5.1)
Sodium: 136 mmol/L (ref 135–145)
Total Bilirubin: 1.1 mg/dL (ref 0.3–1.2)
Total Protein: 7.1 g/dL (ref 6.5–8.1)

## 2020-09-06 LAB — URINALYSIS, ROUTINE W REFLEX MICROSCOPIC
Bilirubin Urine: NEGATIVE
Glucose, UA: 50 mg/dL — AB
Hgb urine dipstick: NEGATIVE
Ketones, ur: NEGATIVE mg/dL
Leukocytes,Ua: NEGATIVE
Nitrite: NEGATIVE
Protein, ur: NEGATIVE mg/dL
Specific Gravity, Urine: >1.046 — ABNORMAL HIGH (ref 1.005–1.030)
pH: 7 (ref 5.0–8.0)

## 2020-09-06 LAB — CBG MONITORING, ED: Glucose-Capillary: 180 mg/dL — ABNORMAL HIGH (ref 70–99)

## 2020-09-06 LAB — APTT: aPTT: 21 seconds — ABNORMAL LOW (ref 24–36)

## 2020-09-06 LAB — ETHANOL: Alcohol, Ethyl (B): <10 mg/dL (ref <10)

## 2020-09-06 LAB — RAPID URINE DRUG SCREEN, HOSP PERFORMED
Amphetamines: NOT DETECTED
Barbiturates: NOT DETECTED
Benzodiazepines: NOT DETECTED
Cocaine: POSITIVE — AB
Opiates: NOT DETECTED
Tetrahydrocannabinol: NOT DETECTED

## 2020-09-06 LAB — DIFFERENTIAL
Abs Immature Granulocytes: 0.04 10*3/uL (ref 0.00–0.07)
Basophils Absolute: 0.1 10*3/uL (ref 0.0–0.1)
Basophils Relative: 1 %
Eosinophils Absolute: 0.2 10*3/uL (ref 0.0–0.5)
Eosinophils Relative: 3 %
Immature Granulocytes: 1 %
Lymphocytes Relative: 23 %
Lymphs Abs: 1.8 10*3/uL (ref 0.7–4.0)
Monocytes Absolute: 0.7 10*3/uL (ref 0.1–1.0)
Monocytes Relative: 8 %
Neutro Abs: 5.3 10*3/uL (ref 1.7–7.7)
Neutrophils Relative %: 64 %

## 2020-09-06 LAB — SARS CORONAVIRUS 2 BY RT PCR (HOSPITAL ORDER, PERFORMED IN ~~LOC~~ HOSPITAL LAB): SARS Coronavirus 2: NEGATIVE

## 2020-09-06 LAB — PROTIME-INR
INR: 0.9 (ref 0.8–1.2)
Prothrombin Time: 12.1 seconds (ref 11.4–15.2)

## 2020-09-06 MED ORDER — ACETAMINOPHEN 325 MG PO TABS: 650.0000 mg | ORAL_TABLET | ORAL | Status: DC | PRN

## 2020-09-06 MED ORDER — STROKE: EARLY STAGES OF RECOVERY BOOK
Freq: Once | Status: AC
  Filled 2020-09-06 (×2): qty 1

## 2020-09-06 MED ORDER — CHLORHEXIDINE GLUCONATE CLOTH 2 % EX PADS
6.0000 | MEDICATED_PAD | Freq: Every day | CUTANEOUS | Status: DC
  Administered 2020-09-06 – 2020-09-15 (×9): 6 via TOPICAL

## 2020-09-06 MED ORDER — LABETALOL HCL 5 MG/ML IV SOLN
10.0000 mg | Freq: Once | INTRAVENOUS | Status: AC
  Administered 2020-09-06: 10 mg via INTRAVENOUS
  Filled 2020-09-06: qty 4

## 2020-09-06 MED ORDER — CLEVIDIPINE BUTYRATE 0.5 MG/ML IV EMUL
0.0000 mg/h | INTRAVENOUS | Status: DC
  Administered 2020-09-06 (×2): 21 mg/h via INTRAVENOUS
  Administered 2020-09-06: 1 mg/h via INTRAVENOUS
  Administered 2020-09-06: 21 mg/h via INTRAVENOUS
  Administered 2020-09-06: 19 mg/h via INTRAVENOUS
  Administered 2020-09-07: 21 mg/h via INTRAVENOUS
  Administered 2020-09-07: 13 mg/h via INTRAVENOUS
  Administered 2020-09-07: 6 mg/h via INTRAVENOUS
  Administered 2020-09-07: 14 mg/h via INTRAVENOUS
  Administered 2020-09-07: 21 mg/h via INTRAVENOUS
  Filled 2020-09-06 (×2): qty 50
  Filled 2020-09-06 (×2): qty 100
  Filled 2020-09-06: qty 50
  Filled 2020-09-06: qty 100
  Filled 2020-09-06 (×3): qty 50

## 2020-09-06 MED ORDER — LABETALOL HCL 5 MG/ML IV SOLN
INTRAVENOUS | Status: AC
  Filled 2020-09-06: qty 4

## 2020-09-06 MED ORDER — ACETAMINOPHEN 650 MG RE SUPP
650.0000 mg | RECTAL | Status: DC | PRN
  Administered 2020-09-07: 650 mg via RECTAL
  Filled 2020-09-06: qty 1

## 2020-09-06 MED ORDER — PANTOPRAZOLE SODIUM 40 MG IV SOLR
40.0000 mg | Freq: Every day | INTRAVENOUS | Status: DC
  Administered 2020-09-06 – 2020-09-07 (×2): 40 mg via INTRAVENOUS
  Filled 2020-09-06: qty 40

## 2020-09-06 MED ORDER — IOHEXOL 350 MG/ML SOLN
75.0000 mL | Freq: Once | INTRAVENOUS | Status: AC | PRN
  Administered 2020-09-06: 75 mL via INTRAVENOUS

## 2020-09-06 MED ORDER — SENNOSIDES-DOCUSATE SODIUM 8.6-50 MG PO TABS
1.0000 | ORAL_TABLET | Freq: Two times a day (BID) | ORAL | Status: DC
  Filled 2020-09-06: qty 1

## 2020-09-06 MED ORDER — LABETALOL HCL 5 MG/ML IV SOLN
10.0000 mg | INTRAVENOUS | Status: DC | PRN
  Administered 2020-09-06 – 2020-09-08 (×4): 10 mg via INTRAVENOUS
  Filled 2020-09-06 (×3): qty 4

## 2020-09-06 MED ORDER — ACETAMINOPHEN 160 MG/5ML PO SOLN: 650.0000 mg | ORAL | Status: DC | PRN

## 2020-09-06 NOTE — H&P (Signed)
Neurology H&P  CC: Left-sided weakness  History is obtained from: Patient  HPI: Zachary Flores is a 54 y.o. male with a history of hypertension who presents with left-sided weakness that started when he awoke this morning.  He went to bed at 1130 without symptoms.  He has noticed double vision as well which may have worsened slightly over the course of the day.  Due to the symptoms he sought care in the emergency department where it was noted that he had left-sided weakness and therefore was sent for a CT/CTA on presumption of stroke.  This revealed a pontine hemorrhage and neurology has been consulted for further evaluation and management.  He has history of hypertension, but has been out of his medications for a a while now.   LKW: 11:30 PM tpa given?: No, ICH IR Thrombectomy? No, ICH ICH score: One  ROS: A complete ROS was performed and is negative except as noted in the HPI.    Past medical history: Hypertension   Family history:  He denies any medical problems around the family   Social History:  reports that he has been smoking. He has never used smokeless tobacco. He reports current alcohol use of about 24.0 standard drinks of alcohol per week. No history on file for drug use.   Prior to Admission medications   Medication Sig Start Date End Date Taking? Authorizing Provider  lisinopril (ZESTRIL) 5 MG tablet Take 1 tablet (5 mg total) by mouth daily. 07/01/19 07/31/19  Jerald Kief, MD  metoprolol tartrate (LOPRESSOR) 25 MG tablet Take 1 tablet (25 mg total) by mouth 2 (two) times daily. 07/01/19 06/30/20  Jerald Kief, MD     Exam: Current vital signs: BP (!) 150/99   Pulse 88   Temp (!) 97.5 F (36.4 C) (Oral)   Resp (!) 22   SpO2 97%    Physical Exam  Constitutional: Appears well-developed and well-nourished.  Psych: Affect appropriate to situation Eyes: No scleral injection HENT: No OP obstrucion Head: Normocephalic.  Cardiovascular: Normal rate and  regular rhythm.  Respiratory: Effort normal and breath sounds normal to anterior ascultation GI: Soft.  No distension. There is no tenderness.  Skin: WDI  Neuro: Mental Status: Patient is awake, alert, oriented to person, place, month, year, and situation. Patient is able to give a clear and coherent history. No signs of aphasia or neglect Cranial Nerves: II: Visual Fields are full. Pupils are equal, round, and reactive to light.   III,IV, VI: He has a 1-1/2 syndrome with only abduction in his left eye preserved. VII: Facial movement is slightly weak on the left Motor: He has 4/5 strength of the left arm and leg with impairment in fine motor movements out of proportion to weakness.  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  He is being admitted for close monitoring to the neuro ICU and  will need strict blood pressure management.  He is at risk for developing hydrocephalus and therefore if he has a worsening of his exam, a stat head CT would be prudent.  We will repeat head CT in the morning otherwise.  Plan: 1) Admit to ICU 2) no antiplatelets or anticoagulants 3) blood pressure control with goal systolic 120 - 140 4) Frequent neuro checks 5) If symptoms worsen or there is decreased mental status, repeat stat head  CT 6) PT,OT,ST    This patient is critically ill and at significant risk of neurological worsening, death and care requires constant monitoring of vital signs, hemodynamics,respiratory and cardiac monitoring, neurological assessment, discussion with family, other specialists and medical decision making of high complexity. I spent 45 minutes of neurocritical care time  in the care of  this patient. This was time spent independent of any time provided by nurse practitioner or PA.  Ritta Slot, MD [facility] [phone]  If 7pm- 7am, please page neurology on call as listed in AMION.

## 2020-09-06 NOTE — Code Documentation (Signed)
Pt is a 54 yo male who came in with complaints of left sided weakness and slurred speech. Pt is on Eliquis and reported to be LSN 2300. Neurology was consulted after he noted to have hemorrhage on CT.   Code Stroke Hemorrhage activated. Arrived to bedside and ED RN had cleviprex. Clevirprex started and titrated per protocol for patient. Pt continued to be > 140 after Cleviprex on 21 mg/hr. MD Amada Jupiter made aware and Labetalol order.   See MAR for BP management. NIHSS 8 - see timeline.

## 2020-09-06 NOTE — Progress Notes (Signed)
Pt pulling lines and BP cuff off.Sweating and agitated.  MD notified, Soft wrist restraints placed per order.

## 2020-09-06 NOTE — ED Provider Notes (Signed)
[facility] EMERGENCY DEPARTMENT Provider Note   CSN: [id number] Arrival date & time: 09/06/20  1052     History Chief Complaint  Patient presents with  . Stroke Symptoms    Zachary Flores is a 54 y.o. male.  The history is provided by the patient, medical records and the EMS personnel. No language interpreter was used.  Neurologic Problem This is a recurrent problem. The current episode started 6 to 12 hours ago. The problem occurs constantly. The problem has not changed since onset.Pertinent negatives include no chest pain, no abdominal pain, no headaches and no shortness of breath. Nothing aggravates the symptoms. Nothing relieves the symptoms. He has tried nothing for the symptoms. The treatment provided no relief.       No past medical history on file.  Patient Active Problem List   Diagnosis Date Noted  . TIA (transient ischemic attack) 06/30/2019  . Alcohol abuse with intoxication (HCC) 06/30/2019  . Elevated LFTs 06/30/2019    No past surgical history on file.     Family History  Family history unknown: Yes    Social History   Tobacco Use  . Smoking status: Current Every Day Smoker  . Smokeless tobacco: Never Used  Substance Use Topics  . Alcohol use: Yes    Alcohol/week: 24.0 standard drinks    Types: 24 Cans of beer per week  . Drug use: Not on file    Home Medications Prior to Admission medications   Medication Sig Start Date End Date Taking? Authorizing Provider  lisinopril (ZESTRIL) 5 MG tablet Take 1 tablet (5 mg total) by mouth daily. 07/01/19 07/31/19  Jerald Kief, MD  metoprolol tartrate (LOPRESSOR) 25 MG tablet Take 1 tablet (25 mg total) by mouth 2 (two) times daily. 07/01/19 06/30/20  Jerald Kief, MD    Allergies    Patient has no known allergies.  Review of Systems   Review of Systems  Constitutional: Negative for chills, diaphoresis, fatigue and fever.  HENT: Negative for congestion.   Eyes: Negative for  photophobia and visual disturbance.  Respiratory: Negative for cough, chest tightness, shortness of breath and wheezing.   Cardiovascular: Negative for chest pain, palpitations and leg swelling.  Gastrointestinal: Negative for abdominal pain, constipation, diarrhea, nausea and vomiting.  Genitourinary: Negative for dysuria and flank pain.  Musculoskeletal: Negative for back pain, neck pain and neck stiffness.  Skin: Negative for rash and wound.  Neurological: Positive for speech difficulty, weakness and numbness. Negative for dizziness, seizures, facial asymmetry, light-headedness and headaches.  Psychiatric/Behavioral: Negative for agitation.  All other systems reviewed and are negative.   Physical Exam Updated Vital Signs There were no vitals taken for this visit.  Physical Exam Vitals and nursing note reviewed.  Constitutional:      General: He is not in acute distress.    Appearance: He is well-developed. He is not ill-appearing, toxic-appearing or diaphoretic.  HENT:     Head: Normocephalic and atraumatic.     Comments: Right eye cannot move, L eye cannot move inward.     Nose: No congestion or rhinorrhea.     Mouth/Throat:     Pharynx: No oropharyngeal exudate or posterior oropharyngeal erythema.  Eyes:     Extraocular Movements:     Right eye: Abnormal extraocular motion present.     Left eye: Abnormal extraocular motion present.     Conjunctiva/sclera: Conjunctivae normal.     Pupils: Pupils are equal, round, and reactive to light.  Cardiovascular:  Rate and Rhythm: Normal rate and regular rhythm.     Heart sounds: No murmur heard.   Pulmonary:     Effort: Pulmonary effort is normal. No respiratory distress.     Breath sounds: Normal breath sounds.  Abdominal:     Palpations: Abdomen is soft.     Tenderness: There is no abdominal tenderness. There is no right CVA tenderness, left CVA tenderness, guarding or rebound.  Musculoskeletal:        General: No  tenderness.     Cervical back: Neck supple.     Right lower leg: No edema.     Left lower leg: No edema.  Skin:    General: Skin is warm and dry.     Capillary Refill: Capillary refill takes less than 2 seconds.     Coloration: Skin is not pale.  Neurological:     Mental Status: He is alert and oriented to person, place, and time.     Cranial Nerves: Dysarthria present. No facial asymmetry.     Sensory: Sensory deficit present.     Motor: Weakness and abnormal muscle tone present. No tremor or seizure activity.     Coordination: Finger-Nose-Finger Test abnormal.     Comments: Subjective numbness of left face, left arm, left leg.  Pain in left arm and left leg.  Finger-nose-finger abnormal on left side.  Psychiatric:        Mood and Affect: Mood normal.     ED Results / Procedures / Treatments   Labs (all labs ordered are listed, but only abnormal results are displayed) Labs Reviewed  CBC - Abnormal; Notable for the following components:      Result Value   Platelets 413 (*)    All other components within normal limits  COMPREHENSIVE METABOLIC PANEL - Abnormal; Notable for the following components:   Glucose, Bld 188 (*)    AST 61 (*)    ALT 45 (*)    All other components within normal limits  RAPID URINE DRUG SCREEN, HOSP PERFORMED - Abnormal; Notable for the following components:   Cocaine POSITIVE (*)    All other components within normal limits  URINALYSIS, ROUTINE W REFLEX MICROSCOPIC - Abnormal; Notable for the following components:   Specific Gravity, Urine >1.046 (*)    Glucose, UA 50 (*)    All other components within normal limits  APTT - Abnormal; Notable for the following components:   aPTT 21 (*)    All other components within normal limits  CBG MONITORING, ED - Abnormal; Notable for the following components:   Glucose-Capillary 180 (*)    All other components within normal limits  I-STAT CHEM 8, ED - Abnormal; Notable for the following components:    Glucose, Bld 162 (*)    Calcium, Ion 1.05 (*)    All other components within normal limits  SARS CORONAVIRUS 2 BY RT PCR (HOSPITAL ORDER, PERFORMED IN [facility])  ETHANOL  DIFFERENTIAL  PROTIME-INR  HIV ANTIBODY (ROUTINE TESTING W REFLEX)    EKG None  Radiology CT Angio Head W or Wo Contrast  Result Date: 09/06/2020 CLINICAL DATA:  Left-sided weakness, numbness, and slurred speech. EXAM: CT ANGIOGRAPHY HEAD AND NECK TECHNIQUE: Multidetector CT imaging of the head and neck was performed using the standard protocol during bolus administration of intravenous contrast. Multiplanar CT image reconstructions and MIPs were obtained to evaluate the vascular anatomy. Carotid stenosis measurements (when applicable) are obtained utilizing NASCET criteria, using the distal internal carotid diameter  as the denominator. CONTRAST:  125 mL Omnipaque 350 COMPARISON:  Head CT and MRI 06/30/2019 FINDINGS: CT HEAD FINDINGS Brain: An acute pontine hemorrhage measures 1.9 x 1.4 x 2.1 cm (estimated volume of 2.8 mL) with extension slightly greater to the left of midline than the right. There is no significant surrounding edema or posterior fossa mass effect. No acute supratentorial or extra-axial hemorrhage is identified. No acute large territory infarct, midline shift, or extra-axial fluid collection is evident. Patchy and confluent hypodensities in the cerebral white matter bilaterally are unchanged and nonspecific but compatible with severe chronic small vessel ischemic disease. Chronic lacunar infarcts in the right basal ganglia and left thalamus are unchanged. There is mild cerebral atrophy. Vascular: Calcified atherosclerosis at the skull base. No hyperdense vessel. Skull: No fracture or suspicious osseous lesion. Sinuses: Visualized paranasal sinuses and mastoid air cells are clear. Orbits: Unremarkable. Review of the MIP images confirms the above findings CTA NECK FINDINGS Aortic arch: Normal variant  aortic arch branching pattern with common origin of the brachiocephalic and left common carotid arteries. Right carotid system: Patent with mild soft and calcified plaque in the distal cervical ICA. No evidence of significant stenosis or dissection. Left carotid system: Patent with mild, predominantly calcified plaque in the distal common carotid artery and proximal ICA. No evidence of significant stenosis or dissection. Vertebral arteries: Patent without evidence of significant stenosis or dissection. Dominant left vertebral artery. Skeleton: Mild cervical spondylosis. Other neck: No evidence of cervical lymphadenopathy or mass. Upper chest: Mild centrilobular and paraseptal emphysema. Review of the MIP images confirms the above findings CTA HEAD FINDINGS Anterior circulation: The internal carotid arteries are patent from skull base to carotid termini with mild calcified plaque bilaterally. No significant ICA stenosis is identified although assessment is limited by motion artifact mainly in the paraclinoid regions. ACAs and MCAs are patent ACAs and MCAs are patent without evidence of a proximal branch occlusion or significant A1 or M1 stenosis. Detail branch vessel evaluation is limited by motion artifact. No aneurysm is identified. Posterior circulation: The intracranial vertebral arteries are patent to the basilar with the right being hypoplastic distal to the PICA origin. The basilar artery is widely patent. There are large left and moderate-sized right posterior communicating arteries both PCAs are patent without evidence of a significant proximal stenosis. No aneurysm is identified. Venous sinuses: Patent. Anatomic variants: None. Review of the MIP images confirms the above findings IMPRESSION: 1. 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 2. Severe chronic small vessel ischemic disease. 3. Mild atherosclerosis in the head and neck without evidence of a large vessel occlusion, significant proximal stenosis, or aneurysm.  4.  Emphysema (ICD10-J43.9). Critical Value/emergent results were called by telephone at the time of interpretation on 09/06/2020 at 12:55 p.m. to Dr. Lynden Oxfordhristopher Elvy Mclarty, who verbally acknowledged these results. Electronically Signed   By: Sebastian AcheAllen  Grady M.D.   On: 09/06/2020 13:53   CT Angio Neck W and/or Wo Contrast  Result Date: 09/06/2020 CLINICAL DATA:  Left-sided weakness, numbness, and slurred speech. EXAM: CT ANGIOGRAPHY HEAD AND NECK TECHNIQUE: Multidetector CT imaging of the head and neck was performed using the standard protocol during bolus administration of intravenous contrast. Multiplanar CT image reconstructions and MIPs were obtained to evaluate the vascular anatomy. Carotid stenosis measurements (when applicable) are obtained utilizing NASCET criteria, using the distal internal carotid diameter as the denominator. CONTRAST:  125 mL Omnipaque 350 COMPARISON:  Head CT and MRI 06/30/2019 FINDINGS: CT HEAD FINDINGS Brain: An acute pontine hemorrhage measures 1.9  x 1.4 x 2.1 cm (estimated volume of 2.8 mL) with extension slightly greater to the left of midline than the right. There is no significant surrounding edema or posterior fossa mass effect. No acute supratentorial or extra-axial hemorrhage is identified. No acute large territory infarct, midline shift, or extra-axial fluid collection is evident. Patchy and confluent hypodensities in the cerebral white matter bilaterally are unchanged and nonspecific but compatible with severe chronic small vessel ischemic disease. Chronic lacunar infarcts in the right basal ganglia and left thalamus are unchanged. There is mild cerebral atrophy. Vascular: Calcified atherosclerosis at the skull base. No hyperdense vessel. Skull: No fracture or suspicious osseous lesion. Sinuses: Visualized paranasal sinuses and mastoid air cells are clear. Orbits: Unremarkable. Review of the MIP images confirms the above findings CTA NECK FINDINGS Aortic arch: Normal variant  aortic arch branching pattern with common origin of the brachiocephalic and left common carotid arteries. Right carotid system: Patent with mild soft and calcified plaque in the distal cervical ICA. No evidence of significant stenosis or dissection. Left carotid system: Patent with mild, predominantly calcified plaque in the distal common carotid artery and proximal ICA. No evidence of significant stenosis or dissection. Vertebral arteries: Patent without evidence of significant stenosis or dissection. Dominant left vertebral artery. Skeleton: Mild cervical spondylosis. Other neck: No evidence of cervical lymphadenopathy or mass. Upper chest: Mild centrilobular and paraseptal emphysema. Review of the MIP images confirms the above findings CTA HEAD FINDINGS Anterior circulation: The internal carotid arteries are patent from skull base to carotid termini with mild calcified plaque bilaterally. No significant ICA stenosis is identified although assessment is limited by motion artifact mainly in the paraclinoid regions. ACAs and MCAs are patent ACAs and MCAs are patent without evidence of a proximal branch occlusion or significant A1 or M1 stenosis. Detail branch vessel evaluation is limited by motion artifact. No aneurysm is identified. Posterior circulation: The intracranial vertebral arteries are patent to the basilar with the right being hypoplastic distal to the PICA origin. The basilar artery is widely patent. There are large left and moderate-sized right posterior communicating arteries both PCAs are patent without evidence of a significant proximal stenosis. No aneurysm is identified. Venous sinuses: Patent. Anatomic variants: None. Review of the MIP images confirms the above findings IMPRESSION: 1. 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 2. Severe chronic small vessel ischemic disease. 3. Mild atherosclerosis in the head and neck without evidence of a large vessel occlusion, significant proximal stenosis, or aneurysm.  4.  Emphysema (ICD10-J43.9). Critical Value/emergent results were called by telephone at the time of interpretation on 09/06/2020 at 12:55 p.m. to Dr. Lynden Oxford, who verbally acknowledged these results. Electronically Signed   By: Sebastian Ache M.D.   On: 09/06/2020 13:53    Procedures Procedures (including critical care time)  CRITICAL CARE Performed by: Canary Brim Leandrea Ackley Total critical care time: 45 minutes Critical care time was exclusive of separately billable procedures and treating other patients. Critical care was necessary to treat or prevent imminent or life-threatening deterioration. Critical care was time spent personally by me on the following activities: development of treatment plan with patient and/or surrogate as well as nursing, discussions with consultants, evaluation of patient's response to treatment, examination of patient, obtaining history from patient or surrogate, ordering and performing treatments and interventions, ordering and review of laboratory studies, ordering and review of radiographic studies, pulse oximetry and re-evaluation of patient's condition.   Medications Ordered in ED Medications  clevidipine (CLEVIPREX) infusion 0.5 mg/mL (21 mg/hr Intravenous New  Bag/Given 09/06/20 1553)   stroke: mapping our early stages of recovery book (has no administration in time range)  acetaminophen (TYLENOL) tablet 650 mg (has no administration in time range)    Or  acetaminophen (TYLENOL) 160 MG/5ML solution 650 mg (has no administration in time range)    Or  acetaminophen (TYLENOL) suppository 650 mg (has no administration in time range)  senna-docusate (Senokot-S) tablet 1 tablet (0 tablets Oral Hold 09/06/20 1441)  pantoprazole (PROTONIX) injection 40 mg (has no administration in time range)  labetalol (NORMODYNE) injection 10 mg (0 mg Intravenous Hold 09/06/20 1451)  iohexol (OMNIPAQUE) 350 MG/ML injection 75 mL (75 mLs Intravenous Contrast Given 09/06/20  1234)  labetalol (NORMODYNE) injection 10 mg (10 mg Intravenous Given 09/06/20 1309)    ED Course  I have reviewed the triage vital signs and the nursing notes.  Pertinent labs & imaging results that were available during my care of the patient were reviewed by me and considered in my medical decision making (see chart for details).    MDM Rules/Calculators/A&P                          Zachary Flores is a 54 y.o. male with a past medical history significant for prior alcohol abuse, TIA, and prior stroke who presents with left-sided numbness, weakness, and dysarthria today.  According to patient he has not been on "any my medicine" for the last few months.  It is unclear medications he was told to be on.  He says that he had a stroke 2 months ago and had TIA last year.  He reports his symptoms were resolved yesterday and he had no symptoms before going to bed.  He reportedly went to bed around 1130 last night.  He says that when he woke up this morning, he noticed he was having numbness and weakness in the left side of his body.  He has numbness in the left side of his face as well as numbness in the left arm and left leg.  He has weakness in the left arm and left leg I did not appreciate facial droop.  EMS found his glucose to be elevated in the 200s.  He denies any visual changes and denies aphasia, is more dysarthria with his mouth, wanting to say what he wants.  He denies any nausea, vomiting, headache, neck pain, neck stiffness, recent fevers, chills, trauma, abdominal pain, or other symptoms.  On exam, lungs clear and chest is nontender.  Abdomen is nontender.  He has reportedly decreased in station the left face, left arm, left leg.  He has weakness in the left arm and left leg of the right.  He had difficulty with finger-nose-finger testing on the left.  He had symmetric pupils and had difficulty with eye movements.  Clinically I am concerned that patient has had another stroke.  As he is not  having any visual changes or aphasia or dizziness, I do not suspect this is an LVO with his weakness.  I briefly spoke to neurology on arrival and they agree with getting a CTA to start with to assess his vessels with the head CT.  Anticipate following up on the imaging and labs to determine disposition.  1:04 PM I reviewed the patient's CT imaging myself and I was concerned and I have seen the area of intracerebral bleeding. I called radiology who agreed there was a pontine intraparenchymal hemorrhage. I called neurology quickly who will come  see the patient to admit to the ICU. They requested I give him labetalol which I ordered. I reassessed the patient and he is still speaking and answering questions. His blood pressures in the 140 systolic. We will give the blood pressure medication and neurology will be to admit. Patient agrees with admission.   Final Clinical Impression(s) / ED Diagnoses Final diagnoses:  Hemorrhagic stroke [facility])  Dysarthria  Left sided numbness  Left-sided weakness    Clinical Impression: 1. Hemorrhagic stroke (HCC)   2. Dysarthria   3. Left sided numbness   4. Left-sided weakness     Disposition: Admit  This note was prepared with assistance of Dragon voice recognition software. Occasional wrong-word or sound-a-like substitutions may have occurred due to the inherent limitations of voice recognition software.     Cedric Denison, Canary Brim, MD 09/06/20 [phone]

## 2020-09-06 NOTE — ED Triage Notes (Signed)
Pt arrives via EMS from home with complaints of stroke like symptoms. Pt hx of stroke and non-compliant with blood thinner X1 month. LSN 2300 09/05/20 Pt woke up felt like he stumbled out of bed d/t left sided weakness. EMS endorses slurred speech.

## 2020-09-06 NOTE — Progress Notes (Signed)
Pt pulling on fingers, pulled IV out, sweating, can't stop moving. MD notified. MD orders to continue to monitor closely but do not sedate.

## 2020-09-07 ENCOUNTER — Inpatient Hospital Stay (HOSPITAL_COMMUNITY): Payer: Medicaid Other

## 2020-09-07 DIAGNOSIS — F10239 Alcohol dependence with withdrawal, unspecified: Secondary | ICD-10-CM | POA: Diagnosis present

## 2020-09-07 DIAGNOSIS — F10939 Alcohol use, unspecified with withdrawal, unspecified: Secondary | ICD-10-CM | POA: Diagnosis present

## 2020-09-07 LAB — MAGNESIUM: Magnesium: 2.2 mg/dL (ref 1.7–2.4)

## 2020-09-07 LAB — CBC
HCT: 47.7 % (ref 39.0–52.0)
Hemoglobin: 15.5 g/dL (ref 13.0–17.0)
MCH: 28.9 pg (ref 26.0–34.0)
MCHC: 32.5 g/dL (ref 30.0–36.0)
MCV: 89 fL (ref 80.0–100.0)
Platelets: 431 10*3/uL — ABNORMAL HIGH (ref 150–400)
RBC: 5.36 MIL/uL (ref 4.22–5.81)
RDW: 13.9 % (ref 11.5–15.5)
WBC: 9.2 10*3/uL (ref 4.0–10.5)
nRBC: 0 % (ref 0.0–0.2)

## 2020-09-07 LAB — HIV ANTIBODY (ROUTINE TESTING W REFLEX): HIV Screen 4th Generation wRfx: NONREACTIVE

## 2020-09-07 LAB — PHOSPHORUS: Phosphorus: 4.2 mg/dL (ref 2.5–4.6)

## 2020-09-07 MED ORDER — HALOPERIDOL LACTATE 5 MG/ML IJ SOLN
4.0000 mg | Freq: Four times a day (QID) | INTRAMUSCULAR | Status: DC | PRN
  Administered 2020-09-08: 4 mg via INTRAVENOUS
  Filled 2020-09-07: qty 1

## 2020-09-07 MED ORDER — ADULT MULTIVITAMIN W/MINERALS CH
1.0000 | ORAL_TABLET | Freq: Every day | ORAL | Status: DC
  Filled 2020-09-07: qty 1

## 2020-09-07 MED ORDER — LIDOCAINE HCL URETHRAL/MUCOSAL 2 % EX GEL
1.0000 "application " | Freq: Once | CUTANEOUS | Status: AC
  Administered 2020-09-07: 1 via URETHRAL
  Filled 2020-09-07: qty 11

## 2020-09-07 MED ORDER — LORAZEPAM 2 MG/ML IJ SOLN
1.0000 mg | INTRAMUSCULAR | Status: DC | PRN
  Administered 2020-09-07 (×3): 2 mg via INTRAVENOUS
  Filled 2020-09-07 (×4): qty 1

## 2020-09-07 MED ORDER — LORAZEPAM 1 MG PO TABS: 1.0000 mg | ORAL_TABLET | ORAL | Status: DC | PRN

## 2020-09-07 MED ORDER — CHLORHEXIDINE GLUCONATE 0.12 % MT SOLN
15.0000 mL | Freq: Two times a day (BID) | OROMUCOSAL | Status: DC
  Administered 2020-09-07 – 2020-09-15 (×16): 15 mL via OROMUCOSAL
  Filled 2020-09-07 (×15): qty 15

## 2020-09-07 MED ORDER — DEXMEDETOMIDINE HCL IN NACL 400 MCG/100ML IV SOLN: 0.2000 ug/kg/h | INTRAVENOUS | Status: DC

## 2020-09-07 MED ORDER — FOLIC ACID 1 MG PO TABS
1.0000 mg | ORAL_TABLET | Freq: Every day | ORAL | Status: DC
  Filled 2020-09-07: qty 1

## 2020-09-07 MED ORDER — ORAL CARE MOUTH RINSE
15.0000 mL | Freq: Two times a day (BID) | OROMUCOSAL | Status: DC
  Administered 2020-09-07 – 2020-09-15 (×16): 15 mL via OROMUCOSAL

## 2020-09-07 MED ORDER — THIAMINE HCL 100 MG/ML IJ SOLN
100.0000 mg | Freq: Every day | INTRAMUSCULAR | Status: DC
  Administered 2020-09-07 – 2020-09-08 (×2): 100 mg via INTRAVENOUS
  Filled 2020-09-07 (×2): qty 2

## 2020-09-07 MED ORDER — THIAMINE HCL 100 MG PO TABS: 100.0000 mg | ORAL_TABLET | Freq: Every day | ORAL | Status: DC

## 2020-09-07 NOTE — Consult Note (Signed)
[facility] Medical Consultation  Zachary Flores ZDG:[id number] DOB: November 27, 1966 DOA: 09/06/2020 PCP: Patient, No Pcp Per   Requesting physician: Home Date of consultation: Dr. Janalyn Shy Reason for consultation: agitation  Impression/Recommendations Active Problems:   ICH (intracerebral hemorrhage) (HCC)  Acute metabolic/toxic encephalopathy -Probably from a combined effect of cocaine induced SAH and alcohol/substance withdrawal  Alcohol withdrawal versus other undetected substance withdrawal -On Precedex drip, despite patient had breakthrough agitation and withdrawal-like symptoms this morning with severe tachycardia and sweating.  Patient already on a benzo drip and as needed benzo, neurology concerned about oversedation and respiratory compromise, will add as needed Haldol for further episodes of agitation.  Recommend p.o. Seroquel over Haldol once patient reestablishes p.o. -Continue CIWA protocol with as needed benzos and continuous Precedex -CT head today showed stable brainstem hematoma  ICH secondary to cocaine abuse -On CCB drip, goal is 140/80 as per neurology -Agreed with as needed nonselective beta-blocker for breakthroughs -Check EKG  Cocaine abuse -Avoid metoprolol, consider Coreg or p.o. CCB  Elevated glucose -Check A1c and Lipids  I will followup again tomorrow. Please contact me if I can be of assistance in the meanwhile. Thank you for this consultation.  Chief Complaint: Patient sedated  HPI:  54 year old with past medical history of hypertension cocaine abuse presented with new onset of left-sided weakness and mentation changes.  Patient received multiple doses of Ativan this morning and remained sedated at the time I saw him, most of the history provided by ICU nurse at bedside.  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  Patient was admitted to neuro ICU for close monitoring.  Overnight patient became agitated, tachypneic and severe diaphoresis,  and the patient was started on Precedex drip and CIWA protocol, from midnight patient received 4 doses of IV Ativan.  Hospitalist was consulted for helping manage alcohol/substance abuse withdrawal.  Neurology concerns about oversedation and respiratory compromise.  Review of Systems:  Unable to obtain review of system, patient sedated.  No past medical history on file. No past surgical history on file. Social History:  reports that he has been smoking. He has never used smokeless tobacco. He reports current alcohol use of about 24.0 standard drinks of alcohol per week. No history on file for drug use.  No Known Allergies Family History  Family history unknown: Yes    Prior to Admission medications   Medication Sig Start Date End Date Taking? Authorizing Provider  lisinopril (ZESTRIL) 5 MG tablet Take 1 tablet (5 mg total) by mouth daily. 07/01/19 07/31/19  Jerald Kief, MD  metoprolol tartrate (LOPRESSOR) 25 MG tablet Take 1 tablet (25 mg total) by mouth 2 (two) times daily. 07/01/19 06/30/20  Jerald Kief, MD   Physical Exam: Blood pressure 130/88, pulse (!) 111, temperature 98.8 F (37.1 C), temperature source Oral, resp. rate (!) 22, weight 87 kg, SpO2 100 %. Vitals:   09/07/20 1245 09/07/20 1300  BP: 133/85 130/88  Pulse:  (!) 111  Resp:  (!) 22  Temp:    SpO2:  100%     General: Restless  Eyes: Pupils constricted, sluggish to reflex  ENT: No swelling or pallor  Neck: Supple, no thyromegaly  Cardiovascular: Tachycardia, no murmurs  Respiratory: Tachypneic, bilateral lung sounds clear, no wheezing no crackles  Abdomen: Soft nontender nondistended  Skin: Diaphoresis  Musculoskeletal: Restless  Psychiatric: Sedated  Neurologic: Sedated, moving all limbs.  Labs on Admission:  Basic Metabolic Panel: Recent Labs  Lab 09/06/20 1107 09/06/20 1143 09/07/20 5956  NA 136 136  --   K 4.9 3.8  --   CL 99 101  --   CO2 25  --   --   GLUCOSE 188* 162*  --   BUN 7  7  --   CREATININE 0.97 0.80  --   CALCIUM 9.2  --   --   MG  --   --  2.2  PHOS  --   --  4.2   Liver Function Tests: Recent Labs  Lab 09/06/20 1107  AST 61*  ALT 45*  ALKPHOS 73  BILITOT 1.1  PROT 7.1  ALBUMIN 4.2   No results for input(s): LIPASE, AMYLASE in the last 168 hours. No results for input(s): AMMONIA in the last 168 hours. CBC: Recent Labs  Lab 09/06/20 1107 09/06/20 1143 09/07/20 0849  WBC 8.2  --  9.2  NEUTROABS 5.3  --   --   HGB 14.9 16.3 15.5  HCT 46.5 48.0 47.7  MCV 90.3  --  89.0  PLT 413*  --  431*   Cardiac Enzymes: No results for input(s): CKTOTAL, CKMB, CKMBINDEX, TROPONINI in the last 168 hours. BNP: Invalid input(s): POCBNP CBG: Recent Labs  Lab 09/06/20 1102  GLUCAP 180*    Radiological Exams on Admission: CT Angio Head W or Wo Contrast  Result Date: 09/06/2020 CLINICAL DATA:  Left-sided weakness, numbness, and slurred speech. EXAM: CT ANGIOGRAPHY HEAD AND NECK TECHNIQUE: Multidetector CT imaging of the head and neck was performed using the standard protocol during bolus administration of intravenous contrast. Multiplanar CT image reconstructions and MIPs were obtained to evaluate the vascular anatomy. Carotid stenosis measurements (when applicable) are obtained utilizing NASCET criteria, using the distal internal carotid diameter as the denominator. CONTRAST:  125 mL Omnipaque 350 COMPARISON:  Head CT and MRI 06/30/2019 FINDINGS: CT HEAD FINDINGS Brain: An acute pontine hemorrhage measures 1.9 x 1.4 x 2.1 cm (estimated volume of 2.8 mL) with extension slightly greater to the left of midline than the right. There is no significant surrounding edema or posterior fossa mass effect. No acute supratentorial or extra-axial hemorrhage is identified. No acute large territory infarct, midline shift, or extra-axial fluid collection is evident. Patchy and confluent hypodensities in the cerebral white matter bilaterally are unchanged and nonspecific but  compatible with severe chronic small vessel ischemic disease. Chronic lacunar infarcts in the right basal ganglia and left thalamus are unchanged. There is mild cerebral atrophy. Vascular: Calcified atherosclerosis at the skull base. No hyperdense vessel. Skull: No fracture or suspicious osseous lesion. Sinuses: Visualized paranasal sinuses and mastoid air cells are clear. Orbits: Unremarkable. Review of the MIP images confirms the above findings CTA NECK FINDINGS Aortic arch: Normal variant aortic arch branching pattern with common origin of the brachiocephalic and left common carotid arteries. Right carotid system: Patent with mild soft and calcified plaque in the distal cervical ICA. No evidence of significant stenosis or dissection. Left carotid system: Patent with mild, predominantly calcified plaque in the distal common carotid artery and proximal ICA. No evidence of significant stenosis or dissection. Vertebral arteries: Patent without evidence of significant stenosis or dissection. Dominant left vertebral artery. Skeleton: Mild cervical spondylosis. Other neck: No evidence of cervical lymphadenopathy or mass. Upper chest: Mild centrilobular and paraseptal emphysema. Review of the MIP images confirms the above findings CTA HEAD FINDINGS Anterior circulation: The internal carotid arteries are patent from skull base to carotid termini with mild calcified plaque bilaterally. No significant ICA stenosis is identified although assessment is limited  by motion artifact mainly in the paraclinoid regions. ACAs and MCAs are patent ACAs and MCAs are patent without evidence of a proximal branch occlusion or significant A1 or M1 stenosis. Detail branch vessel evaluation is limited by motion artifact. No aneurysm is identified. Posterior circulation: The intracranial vertebral arteries are patent to the basilar with the right being hypoplastic distal to the PICA origin. The basilar artery is widely patent. There are large  left and moderate-sized right posterior communicating arteries both PCAs are patent without evidence of a significant proximal stenosis. No aneurysm is identified. Venous sinuses: Patent. Anatomic variants: None. Review of the MIP images confirms the above findings IMPRESSION: 1. 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 2. Severe chronic small vessel ischemic disease. 3. Mild atherosclerosis in the head and neck without evidence of a large vessel occlusion, significant proximal stenosis, or aneurysm. 4.  Emphysema (ICD10-J43.9). Critical Value/emergent results were called by telephone at the time of interpretation on 09/06/2020 at 12:55 p.m. to Dr. Lynden Oxford, who verbally acknowledged these results. Electronically Signed   By: Sebastian Ache M.D.   On: 09/06/2020 13:53   CT HEAD WO CONTRAST  Result Date: 09/07/2020 CLINICAL DATA:  Follow-up parenchymal hemorrhage EXAM: CT HEAD WITHOUT CONTRAST TECHNIQUE: Contiguous axial images were obtained from the base of the skull through the vertex without intravenous contrast. COMPARISON:  Head CT from yesterday FINDINGS: Brain: Upper brainstem hematoma which is best visualized on sagittal images for reproducible measurement, unchanged in size at 21 by 14 mm. No visible adjacent brain edema. No visible intra ventricular extension. Extensive chronic small vessel ischemia in the cerebral white matter for age. Chronic lacune at the right corona radiata. Vascular: No hyperdense vessel or unexpected calcification. Skull: Negative Sinuses/Orbits: Negative IMPRESSION: 1. Size stable upper brainstem hematoma. No intraventricular extension or hydrocephalus. 2. Extensive chronic small vessel ischemia. Electronically Signed   By: Marnee Spring M.D.   On: 09/07/2020 06:08   CT Angio Neck W and/or Wo Contrast  Result Date: 09/06/2020 CLINICAL DATA:  Left-sided weakness, numbness, and slurred speech. EXAM: CT ANGIOGRAPHY HEAD AND NECK TECHNIQUE: Multidetector CT imaging of the head  and neck was performed using the standard protocol during bolus administration of intravenous contrast. Multiplanar CT image reconstructions and MIPs were obtained to evaluate the vascular anatomy. Carotid stenosis measurements (when applicable) are obtained utilizing NASCET criteria, using the distal internal carotid diameter as the denominator. CONTRAST:  125 mL Omnipaque 350 COMPARISON:  Head CT and MRI 06/30/2019 FINDINGS: CT HEAD FINDINGS Brain: An acute pontine hemorrhage measures 1.9 x 1.4 x 2.1 cm (estimated volume of 2.8 mL) with extension slightly greater to the left of midline than the right. There is no significant surrounding edema or posterior fossa mass effect. No acute supratentorial or extra-axial hemorrhage is identified. No acute large territory infarct, midline shift, or extra-axial fluid collection is evident. Patchy and confluent hypodensities in the cerebral white matter bilaterally are unchanged and nonspecific but compatible with severe chronic small vessel ischemic disease. Chronic lacunar infarcts in the right basal ganglia and left thalamus are unchanged. There is mild cerebral atrophy. Vascular: Calcified atherosclerosis at the skull base. No hyperdense vessel. Skull: No fracture or suspicious osseous lesion. Sinuses: Visualized paranasal sinuses and mastoid air cells are clear. Orbits: Unremarkable. Review of the MIP images confirms the above findings CTA NECK FINDINGS Aortic arch: Normal variant aortic arch branching pattern with common origin of the brachiocephalic and left common carotid arteries. Right carotid system: Patent with mild soft and calcified  plaque in the distal cervical ICA. No evidence of significant stenosis or dissection. Left carotid system: Patent with mild, predominantly calcified plaque in the distal common carotid artery and proximal ICA. No evidence of significant stenosis or dissection. Vertebral arteries: Patent without evidence of significant stenosis or  dissection. Dominant left vertebral artery. Skeleton: Mild cervical spondylosis. Other neck: No evidence of cervical lymphadenopathy or mass. Upper chest: Mild centrilobular and paraseptal emphysema. Review of the MIP images confirms the above findings CTA HEAD FINDINGS Anterior circulation: The internal carotid arteries are patent from skull base to carotid termini with mild calcified plaque bilaterally. No significant ICA stenosis is identified although assessment is limited by motion artifact mainly in the paraclinoid regions. ACAs and MCAs are patent ACAs and MCAs are patent without evidence of a proximal branch occlusion or significant A1 or M1 stenosis. Detail branch vessel evaluation is limited by motion artifact. No aneurysm is identified. Posterior circulation: The intracranial vertebral arteries are patent to the basilar with the right being hypoplastic distal to the PICA origin. The basilar artery is widely patent. There are large left and moderate-sized right posterior communicating arteries both PCAs are patent without evidence of a significant proximal stenosis. No aneurysm is identified. Venous sinuses: Patent. Anatomic variants: None. Review of the MIP images confirms the above findings IMPRESSION: 1. 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 2. Severe chronic small vessel ischemic disease. 3. Mild atherosclerosis in the head and neck without evidence of a large vessel occlusion, significant proximal stenosis, or aneurysm. 4.  Emphysema (ICD10-J43.9). Critical Value/emergent results were called by telephone at the time of interpretation on 09/06/2020 at 12:55 p.m. to Dr. Lynden Oxfordhristopher Tegeler, who verbally acknowledged these results. Electronically Signed   By: Sebastian AcheAllen  Grady M.D.   On: 09/06/2020 13:53    EKG: Ordered  Time spent: 35  Emeline Generaling T Pratham Cassatt [facility] Pager [phone] 09/07/2020, 3:53 PM

## 2020-09-07 NOTE — Progress Notes (Signed)
OT Cancellation Note  Patient Details Name: Lasalle Abee MRN: [id number] DOB: 1966-03-30   Cancelled Treatment:    Reason Eval/Treat Not Completed: Active bedrest order (until 2pm), will follow up this afternoon/as able.  Marcy Siren, OT Acute Rehabilitation Services Pager [phone] Office [phone]   Orlando Penner 09/07/2020, 1:00 PM

## 2020-09-07 NOTE — Progress Notes (Signed)
Patient's brother Sherilyn Cooter in room to visit patient, states that patient is one of 4 siblings with no surviving parents. Patient is divorced with 2 surviving children of adult age, brother is unsure of children's contact information. Oldest daughter's name is Programmer, systems.   Aris Lot, RN

## 2020-09-07 NOTE — Progress Notes (Signed)
PT Cancellation Note  Patient Details Name: Sally Reimers MRN: [id number] DOB: January 16, 1966   Cancelled Treatment:    Reason Eval/Treat Not Completed: Active bedrest order. Pt on bedrest until 2pm, PT to return as able to complete PT eval.  Lewis Shock, PT, DPT Acute Rehabilitation Services Pager #: [phone] Office #: [phone]    Iona Hansen 09/07/2020, 11:55 AM

## 2020-09-07 NOTE — TOC CAGE-AID Note (Signed)
Transition of Care [facility]) - CAGE-AID Screening   Patient Details  Name: Zachary Flores MRN: [id number] Date of Birth: 06-26-1966  Transition of Care [facility]) CM/SW Contact:    Jimmy Picket, LCSWA Phone Number: 09/07/2020, 3:42 PM   Clinical Narrative:  Pt unable to participate in assessment due to pt not being oriented. Please reconult when pt is oriented.   CAGE-AID Screening: Substance Abuse Screening unable to be completed due to: : Patient unable to participate            Isabella Stalling Clinical Social Worker [phone]

## 2020-09-07 NOTE — Progress Notes (Signed)
Pt confused, hallucinating, tachycardic, hypertensive & diaphretic, neuro paged, see new orders.

## 2020-09-07 NOTE — Progress Notes (Signed)
Patient u/o for shift 50cc, bladder scan shows 68m in bladder. While straight catheterizing patient this RN met mild resistance and saw blood come through catheter and around catheter. This RN withdrew the catheter and the bleeding stopped. Stroke MD notified who requested I contact hospitalist. Paged MD ZRoosevelt Locksand informed him of what happened, will await further orders.  MCandy Sledge RN

## 2020-09-07 NOTE — Progress Notes (Signed)
SLP Cancellation Note  Patient Details Name: Zachary Flores MRN: [id number] DOB: 20-Jun-1966   Cancelled treatment:       Reason Eval/Treat Not Completed: Fatigue/lethargy limiting ability to participate. Per RN, pt not alert enough for swallowing evaluation at this time. She recommends holding eval for now, so will return as able.   Mahala Menghini., M.A. CCC-SLP Acute Rehabilitation Services Pager [phone] Office [phone]  09/07/2020, 9:02 AM

## 2020-09-07 NOTE — Progress Notes (Signed)
STROKE TEAM PROGRESS NOTE   INTERVAL HISTORY I have personally reviewed history of presenting illness, electronic medical records and imaging films in PACS.  He has a history of polysubstance abuse including alcohol and cocaine and presented with hypertensive emergency and altered mental status.  CT scan shows large dorsal brainstem hemorrhage and clinically he has severe dysarthria and complete ophthalmoplegia.  Blood pressure controlled on Cleviprex drip.  Urine drug screen positive for cocaine.  CT angiograms negative for any significant stenosis or aneurysms.  Vitals:   09/07/20 0400 09/07/20 0500 09/07/20 0600 09/07/20 0800  BP: 139/87 136/88 (!) 134/94   Pulse: 93 98 94   Resp: 19 (!) 24 (!) 22   Temp: 99.1 F (37.3 C)   99.3 F (37.4 C)  TempSrc: Axillary   Axillary  SpO2: 100% 99% 96%   Weight:       CBC:  Recent Labs  Lab 09/06/20 1107 09/06/20 1143  WBC 8.2  --   NEUTROABS 5.3  --   HGB 14.9 16.3  HCT 46.5 48.0  MCV 90.3  --   PLT 413*  --    Basic Metabolic Panel:  Recent Labs  Lab 09/06/20 1107 09/06/20 1143  NA 136 136  K 4.9 3.8  CL 99 101  CO2 25  --   GLUCOSE 188* 162*  BUN 7 7  CREATININE 0.97 0.80  CALCIUM 9.2  --    Lipid Panel: No results for input(s): CHOL, TRIG, HDL, CHOLHDL, VLDL, LDLCALC in the last 168 hours. HgbA1c: No results for input(s): HGBA1C in the last 168 hours. Urine Drug Screen:  Recent Labs  Lab 09/06/20 1441  LABOPIA NONE DETECTED  COCAINSCRNUR POSITIVE*  LABBENZ NONE DETECTED  AMPHETMU NONE DETECTED  THCU NONE DETECTED  LABBARB NONE DETECTED    Alcohol Level  Recent Labs  Lab 09/06/20 1107  ETH <10    IMAGING past 24 hours CT Angio Head W or Wo Contrast  Result Date: 09/06/2020 CLINICAL DATA:  Left-sided weakness, numbness, and slurred speech. EXAM: CT ANGIOGRAPHY HEAD AND NECK TECHNIQUE: Multidetector CT imaging of the head and neck was performed using the standard protocol during bolus administration of  intravenous contrast. Multiplanar CT image reconstructions and MIPs were obtained to evaluate the vascular anatomy. Carotid stenosis measurements (when applicable) are obtained utilizing NASCET criteria, using the distal internal carotid diameter as the denominator. CONTRAST:  125 mL Omnipaque 350 COMPARISON:  Head CT and MRI 06/30/2019 FINDINGS: CT HEAD FINDINGS Brain: An acute pontine hemorrhage measures 1.9 x 1.4 x 2.1 cm (estimated volume of 2.8 mL) with extension slightly greater to the left of midline than the right. There is no significant surrounding edema or posterior fossa mass effect. No acute supratentorial or extra-axial hemorrhage is identified. No acute large territory infarct, midline shift, or extra-axial fluid collection is evident. Patchy and confluent hypodensities in the cerebral white matter bilaterally are unchanged and nonspecific but compatible with severe chronic small vessel ischemic disease. Chronic lacunar infarcts in the right basal ganglia and left thalamus are unchanged. There is mild cerebral atrophy. Vascular: Calcified atherosclerosis at the skull base. No hyperdense vessel. Skull: No fracture or suspicious osseous lesion. Sinuses: Visualized paranasal sinuses and mastoid air cells are clear. Orbits: Unremarkable. Review of the MIP images confirms the above findings CTA NECK FINDINGS Aortic arch: Normal variant aortic arch branching pattern with common origin of the brachiocephalic and left common carotid arteries. Right carotid system: Patent with mild soft and calcified plaque in the distal  cervical ICA. No evidence of significant stenosis or dissection. Left carotid system: Patent with mild, predominantly calcified plaque in the distal common carotid artery and proximal ICA. No evidence of significant stenosis or dissection. Vertebral arteries: Patent without evidence of significant stenosis or dissection. Dominant left vertebral artery. Skeleton: Mild cervical spondylosis.  Other neck: No evidence of cervical lymphadenopathy or mass. Upper chest: Mild centrilobular and paraseptal emphysema. Review of the MIP images confirms the above findings CTA HEAD FINDINGS Anterior circulation: The internal carotid arteries are patent from skull base to carotid termini with mild calcified plaque bilaterally. No significant ICA stenosis is identified although assessment is limited by motion artifact mainly in the paraclinoid regions. ACAs and MCAs are patent ACAs and MCAs are patent without evidence of a proximal branch occlusion or significant A1 or M1 stenosis. Detail branch vessel evaluation is limited by motion artifact. No aneurysm is identified. Posterior circulation: The intracranial vertebral arteries are patent to the basilar with the right being hypoplastic distal to the PICA origin. The basilar artery is widely patent. There are large left and moderate-sized right posterior communicating arteries both PCAs are patent without evidence of a significant proximal stenosis. No aneurysm is identified. Venous sinuses: Patent. Anatomic variants: None. Review of the MIP images confirms the above findings IMPRESSION: 1. 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 2. Severe chronic small vessel ischemic disease. 3. Mild atherosclerosis in the head and neck without evidence of a large vessel occlusion, significant proximal stenosis, or aneurysm. 4.  Emphysema (ICD10-J43.9). Critical Value/emergent results were called by telephone at the time of interpretation on 09/06/2020 at 12:55 p.m. to Dr. Lynden Oxfordhristopher Tegeler, who verbally acknowledged these results. Electronically Signed   By: Sebastian AcheAllen  Grady M.D.   On: 09/06/2020 13:53   CT HEAD WO CONTRAST  Result Date: 09/07/2020 CLINICAL DATA:  Follow-up parenchymal hemorrhage EXAM: CT HEAD WITHOUT CONTRAST TECHNIQUE: Contiguous axial images were obtained from the base of the skull through the vertex without intravenous contrast. COMPARISON:  Head CT from yesterday  FINDINGS: Brain: Upper brainstem hematoma which is best visualized on sagittal images for reproducible measurement, unchanged in size at 21 by 14 mm. No visible adjacent brain edema. No visible intra ventricular extension. Extensive chronic small vessel ischemia in the cerebral white matter for age. Chronic lacune at the right corona radiata. Vascular: No hyperdense vessel or unexpected calcification. Skull: Negative Sinuses/Orbits: Negative IMPRESSION: 1. Size stable upper brainstem hematoma. No intraventricular extension or hydrocephalus. 2. Extensive chronic small vessel ischemia. Electronically Signed   By: Marnee SpringJonathon  Watts M.D.   On: 09/07/2020 06:08   CT Angio Neck W and/or Wo Contrast  Result Date: 09/06/2020 CLINICAL DATA:  Left-sided weakness, numbness, and slurred speech. EXAM: CT ANGIOGRAPHY HEAD AND NECK TECHNIQUE: Multidetector CT imaging of the head and neck was performed using the standard protocol during bolus administration of intravenous contrast. Multiplanar CT image reconstructions and MIPs were obtained to evaluate the vascular anatomy. Carotid stenosis measurements (when applicable) are obtained utilizing NASCET criteria, using the distal internal carotid diameter as the denominator. CONTRAST:  125 mL Omnipaque 350 COMPARISON:  Head CT and MRI 06/30/2019 FINDINGS: CT HEAD FINDINGS Brain: An acute pontine hemorrhage measures 1.9 x 1.4 x 2.1 cm (estimated volume of 2.8 mL) with extension slightly greater to the left of midline than the right. There is no significant surrounding edema or posterior fossa mass effect. No acute supratentorial or extra-axial hemorrhage is identified. No acute large territory infarct, midline shift, or extra-axial fluid collection is evident. Patchy  and confluent hypodensities in the cerebral white matter bilaterally are unchanged and nonspecific but compatible with severe chronic small vessel ischemic disease. Chronic lacunar infarcts in the right basal ganglia and  left thalamus are unchanged. There is mild cerebral atrophy. Vascular: Calcified atherosclerosis at the skull base. No hyperdense vessel. Skull: No fracture or suspicious osseous lesion. Sinuses: Visualized paranasal sinuses and mastoid air cells are clear. Orbits: Unremarkable. Review of the MIP images confirms the above findings CTA NECK FINDINGS Aortic arch: Normal variant aortic arch branching pattern with common origin of the brachiocephalic and left common carotid arteries. Right carotid system: Patent with mild soft and calcified plaque in the distal cervical ICA. No evidence of significant stenosis or dissection. Left carotid system: Patent with mild, predominantly calcified plaque in the distal common carotid artery and proximal ICA. No evidence of significant stenosis or dissection. Vertebral arteries: Patent without evidence of significant stenosis or dissection. Dominant left vertebral artery. Skeleton: Mild cervical spondylosis. Other neck: No evidence of cervical lymphadenopathy or mass. Upper chest: Mild centrilobular and paraseptal emphysema. Review of the MIP images confirms the above findings CTA HEAD FINDINGS Anterior circulation: The internal carotid arteries are patent from skull base to carotid termini with mild calcified plaque bilaterally. No significant ICA stenosis is identified although assessment is limited by motion artifact mainly in the paraclinoid regions. ACAs and MCAs are patent ACAs and MCAs are patent without evidence of a proximal branch occlusion or significant A1 or M1 stenosis. Detail branch vessel evaluation is limited by motion artifact. No aneurysm is identified. Posterior circulation: The intracranial vertebral arteries are patent to the basilar with the right being hypoplastic distal to the PICA origin. The basilar artery is widely patent. There are large left and moderate-sized right posterior communicating arteries both PCAs are patent without evidence of a significant  proximal stenosis. No aneurysm is identified. Venous sinuses: Patent. Anatomic variants: None. Review of the MIP images confirms the above findings IMPRESSION: 1. 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 2. Severe chronic small vessel ischemic disease. 3. Mild atherosclerosis in the head and neck without evidence of a large vessel occlusion, significant proximal stenosis, or aneurysm. 4.  Emphysema (ICD10-J43.9). Critical Value/emergent results were called by telephone at the time of interpretation on 09/06/2020 at 12:55 p.m. to Dr. Lynden Oxford, who verbally acknowledged these results. Electronically Signed   By: Sebastian Ache M.D.   On: 09/06/2020 13:53    PHYSICAL EXAM Middle-aged African-American male not in distress. . Afebrile. Head is nontraumatic. Neck is supple without bruit.    Cardiac exam no murmur or gallop. Lungs are clear to auscultation. Distal pulses are well felt. Neurological Exam :  Patient is drowsy but can be easily aroused.  Is oriented to place and person.  He has severe dysarthria but can be understood.  He has complete ophthalmoplegia with skew eye deviation with right eye showing exotropia and hypertropia.  Is complete horizontal ophthalmoplegia with only trace vertical eye movements.  Pupils equal reactive.  Corneal reflex is preserved.  Fundi not visualized.  Mild bifacial weakness.  Tongue midline motor system exam is antigravity at least 4/5 strength in all 4 extremities though difficult to check fine motor skills due to lack of cooperation.  Subjective diminished sensation in the left body compared to the right.  Coordination slightly impaired on the left compared to the right.  Deep tendon reflex symmetric.  Plantars downgoing.  Gait not tested. ASSESSMENT/PLAN Mr. Firmin Belisle is a 54 y.o. male with history of  HTN presenting with L sided weakness, double vision.   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 Severe small vessel disease. Mild atherosclerosis head & neck. emphysema  CT head stable pontine hemorrhage. Extensive small vessel disease.   2D Echo EF 55-60%. No source of embolus   LDL pending   HgbA1c pending   VTE prophylaxis - SCDs     Diet   Diet NPO time specified    No antithrombotic prior to admission  Therapy recommendations:  pending  Disposition:  pending   Transition to medical Hospitalists service tomorrow  Transfer to floor once off cleviprex gtt  Hypertensive Emergency  BP as high as 198/99 day of admission   Home meds:  Lisinopril 5, metoprolol 25 bid - has not taken in a while b/c he was out  On Cleviprex drip  SBP goal < 140 x 24h then < 160  Wean cleviprex as able  Add PO meds once able to swallow . Long-term BP goal normotensive  Hyperlipidemia  Home meds:  None   LDL pending   Avoid statin in setting of acute hemorrhage  Dysphagia . Secondary to stroke . NPO . Speech on board   Other Stroke Risk Factors  Cigarette smoker, advised to stop smoking  ETOH use, alcohol level <10, advised to drink no more than 2 drink(s) a day. On CIWA protocol.  Substance abuse - UDS:  Cocaine POSITIVE. Patient advised to stop using due to stroke risk.  Obesity, Body mass index is 30.04 kg/m., recommend weight loss, diet and exercise as appropriate   Other Active Problems  Thrombocytosis, PLT 413->431  Hospital day # 1  He has presented with central pontine hemorrhage in setting of hypertensive emergency and cocaine abuse.  Recommend strict blood pressure control with systolic goal below 140 for 24 hours and then 160 thereafter and use Cleviprex drip and change to oral blood pressure medication when he is able to swallow safety.  Close neurological monitoring.  Alcohol withdrawal precautions.  We will consult medical hospitalist team to help with alcohol and substance abuse withdrawal management.  Bedrest today then mobilize out of  bed. No family available at the bedside for discussion.  Discussed with Dr. Chipper Herb medical hospitalist team This patient is critically ill and at significant risk of neurological worsening, death and care requires constant monitoring of vital signs, hemodynamics,respiratory and cardiac monitoring, extensive review of multiple databases, frequent neurological assessment, discussion with family, other specialists and medical decision making of high complexity.I have made any additions or clarifications directly to the above note.This critical care time does not reflect procedure time, or teaching time or supervisory time of PA/NP/Med Resident etc but could involve care discussion time.  I spent 35 minutes of neurocritical care time  in the care of  this patient. Delia Heady, MD   To contact Stroke Continuity provider, please refer to [email or web address]. After hours, contact General Neurology

## 2020-09-08 DIAGNOSIS — I619 Nontraumatic intracerebral hemorrhage, unspecified: Secondary | ICD-10-CM

## 2020-09-08 LAB — LIPID PANEL
Cholesterol: 189 mg/dL (ref 0–200)
HDL: 58 mg/dL (ref >40)
LDL Cholesterol: 100 mg/dL — ABNORMAL HIGH (ref 0–99)
Total CHOL/HDL Ratio: 3.3 RATIO
Triglycerides: 156 mg/dL — ABNORMAL HIGH (ref <150)
VLDL: 31 mg/dL (ref 0–40)

## 2020-09-08 LAB — GLUCOSE, CAPILLARY
Glucose-Capillary: 87 mg/dL (ref 70–99)
Glucose-Capillary: 88 mg/dL (ref 70–99)

## 2020-09-08 LAB — TRIGLYCERIDES: Triglycerides: 150 mg/dL — ABNORMAL HIGH (ref <150)

## 2020-09-08 LAB — HEMOGLOBIN A1C
Hgb A1c MFr Bld: 5.9 % — ABNORMAL HIGH (ref 4.8–5.6)
Mean Plasma Glucose: 122.63 mg/dL

## 2020-09-08 MED ORDER — PANTOPRAZOLE SODIUM 40 MG PO PACK
40.0000 mg | PACK | Freq: Every day | ORAL | Status: DC
  Administered 2020-09-08 – 2020-09-10 (×3): 40 mg
  Filled 2020-09-08 (×3): qty 20

## 2020-09-08 MED ORDER — LABETALOL HCL 5 MG/ML IV SOLN: 10.0000 mg | INTRAVENOUS | Status: DC | PRN

## 2020-09-08 MED ORDER — ADULT MULTIVITAMIN W/MINERALS CH
1.0000 | ORAL_TABLET | Freq: Every day | ORAL | Status: DC
  Administered 2020-09-09 – 2020-09-10 (×2): 1
  Filled 2020-09-08 (×2): qty 1

## 2020-09-08 MED ORDER — THIAMINE HCL 100 MG PO TABS
100.0000 mg | ORAL_TABLET | Freq: Every day | ORAL | Status: DC
  Administered 2020-09-09 – 2020-09-10 (×2): 100 mg
  Filled 2020-09-08 (×2): qty 1

## 2020-09-08 MED ORDER — LISINOPRIL 5 MG PO TABS
5.0000 mg | ORAL_TABLET | Freq: Every day | ORAL | Status: DC
  Administered 2020-09-08 – 2020-09-10 (×3): 5 mg
  Filled 2020-09-08 (×3): qty 1

## 2020-09-08 MED ORDER — LORAZEPAM 1 MG PO TABS: 1.0000 mg | ORAL_TABLET | ORAL | Status: DC | PRN

## 2020-09-08 MED ORDER — FOLIC ACID 1 MG PO TABS
1.0000 mg | ORAL_TABLET | Freq: Every day | ORAL | Status: DC
  Administered 2020-09-09 – 2020-09-10 (×2): 1 mg
  Filled 2020-09-08 (×2): qty 1

## 2020-09-08 MED ORDER — OSMOLITE 1.2 CAL PO LIQD
1000.0000 mL | ORAL | Status: DC
  Filled 2020-09-08: qty 1000

## 2020-09-08 MED ORDER — METOPROLOL TARTRATE 25 MG PO TABS
25.0000 mg | ORAL_TABLET | Freq: Two times a day (BID) | ORAL | Status: DC
  Administered 2020-09-08 – 2020-09-10 (×5): 25 mg
  Filled 2020-09-08 (×5): qty 1

## 2020-09-08 MED ORDER — SENNOSIDES-DOCUSATE SODIUM 8.6-50 MG PO TABS
1.0000 | ORAL_TABLET | Freq: Two times a day (BID) | ORAL | Status: DC
  Administered 2020-09-08 – 2020-09-10 (×4): 1
  Filled 2020-09-08 (×4): qty 1

## 2020-09-08 MED ORDER — PROSOURCE TF PO LIQD
45.0000 mL | Freq: Two times a day (BID) | ORAL | Status: DC
  Administered 2020-09-08 – 2020-09-10 (×5): 45 mL
  Filled 2020-09-08 (×5): qty 45

## 2020-09-08 MED ORDER — INSULIN ASPART 100 UNIT/ML ~~LOC~~ SOLN
0.0000 [IU] | SUBCUTANEOUS | Status: DC
  Administered 2020-09-09 – 2020-09-10 (×6): 2 [IU] via SUBCUTANEOUS

## 2020-09-08 MED ORDER — OSMOLITE 1.2 CAL PO LIQD
1000.0000 mL | ORAL | Status: DC
  Administered 2020-09-08 – 2020-09-10 (×3): 1000 mL
  Filled 2020-09-08 (×4): qty 1000

## 2020-09-08 MED ORDER — LORAZEPAM 2 MG/ML IJ SOLN: 1.0000 mg | INTRAMUSCULAR | Status: DC | PRN

## 2020-09-08 NOTE — Evaluation (Signed)
Speech Language Pathology Evaluation Patient Details Name: Zachary Flores MRN: [id number] DOB: 1966-12-09 Today's Date: 09/08/2020 Time: 7353-2992 SLP Time Calculation (min) (ACUTE ONLY): 11 min  Problem List:  Patient Active Problem List   Diagnosis Date Noted   Alcohol withdrawal syndrome with complication, with unspecified complication (HCC)    Hemorrhagic stroke (HCC) 09/06/2020   TIA (transient ischemic attack) 06/30/2019   Alcohol abuse with intoxication (HCC) 06/30/2019   Elevated LFTs 06/30/2019   Past Medical History: No past medical history on file. Past Surgical History: No past surgical history on file. HPI:  Pt is a 54 yo male presenting with L sided numbness/weakness and dysarthria. CT revealed a pontine hemorrhage. PMH includes: polysubstance abuse (alcohol, cocaine), TIA, CVA, HTN   Assessment / Plan / Recommendation Clinical Impression  Pt has a moderate-severe dysarthria characterized by imprecise articulation, mildly reduced volume, and quick rate, all of which impacts intelligibility at the word to short-phrase level. He also has fluctuating alertness throughout the evaluation that at times seems to further impact speech. This also impacts his sustained attention, comprehension for completion of one-step commands, and storage of new information with pt exhibiting difficulty with even immediate recall. He will need SLP f/u to maximize communication and cognition.     SLP Assessment  SLP Recommendation/Assessment: Patient needs continued Speech Lanaguage Pathology Services SLP Visit Diagnosis: Dysarthria and anarthria (R47.1);Cognitive communication deficit (R41.841)    Follow Up Recommendations  Inpatient Rehab    Frequency and Duration min 2x/week  2 weeks      SLP Evaluation Cognition  Overall Cognitive Status: Impaired/Different from baseline Arousal/Alertness:  (fluctuating) Orientation Level: Oriented to person;Disoriented to place;Disoriented to  time;Disoriented to situation Attention: Focused;Sustained Focused Attention: Impaired Focused Attention Impairment: Functional basic Sustained Attention: Impaired Sustained Attention Impairment: Verbal basic;Functional basic Memory: Impaired Memory Impairment: Storage deficit;Retrieval deficit;Decreased recall of new information;Decreased short term memory Decreased Short Term Memory: Verbal basic Awareness: Impaired Awareness Impairment: Emergent impairment Problem Solving: Impaired Problem Solving Impairment: Functional basic;Verbal basic Safety/Judgment: Impaired       Comprehension  Auditory Comprehension Overall Auditory Comprehension: Impaired Commands: Impaired One Step Basic Commands: 50-74% accurate Conversation: Simple Interfering Components: Attention    Expression Expression Primary Mode of Expression: Verbal Verbal Expression Overall Verbal Expression: Appears within functional limits for tasks assessed   Oral / Motor  Oral Motor/Sensory Function Overall Oral Motor/Sensory Function: Generalized oral weakness (no definitive asymmetry, difficulty following commands tho) Motor Speech Overall Motor Speech: Impaired Respiration: Within functional limits Phonation: Low vocal intensity Resonance: Within functional limits Articulation: Impaired Level of Impairment: Word Intelligibility: Intelligibility reduced Word: 50-74% accurate Phrase: 25-49% accurate Sentence: 25-49% accurate   GO                    Mahala Menghini., M.A. CCC-SLP Acute Rehabilitation Services Pager [phone] Office [phone]  09/08/2020, 9:55 AM

## 2020-09-08 NOTE — Progress Notes (Signed)
STROKE TEAM PROGRESS NOTE   INTERVAL HISTORY Patient is sitting up in bed.  Blood pressure adequately controlled.  Remains intermittently agitated requiring restraints and as needed sedation.  Speech therapy have seen him and recommend n.p.o.  Core track tube to be placed today for nutrition and meds.  Vitals:   09/08/20 0600 09/08/20 0700 09/08/20 0800 09/08/20 0809  BP: 139/65 93/76    Pulse: 98 (!) 106 (!) 101   Resp: 19 17 20    Temp:    99 F (37.2 C)  TempSrc:    Axillary  SpO2: 96% 98% 98%   Weight:       CBC:  Recent Labs  Lab 09/06/20 1107 09/06/20 1107 09/06/20 1143 09/07/20 0849  WBC 8.2  --   --  9.2  NEUTROABS 5.3  --   --   --   HGB 14.9   < > 16.3 15.5  HCT 46.5   < > 48.0 47.7  MCV 90.3  --   --  89.0  PLT 413*  --   --  431*   < > = values in this interval not displayed.   Basic Metabolic Panel:  Recent Labs  Lab 09/06/20 1107 09/06/20 1143 09/07/20 0849  NA 136 136  --   K 4.9 3.8  --   CL 99 101  --   CO2 25  --   --   GLUCOSE 188* 162*  --   BUN 7 7  --   CREATININE 0.97 0.80  --   CALCIUM 9.2  --   --   MG  --   --  2.2  PHOS  --   --  4.2   Lipid Panel:  Recent Labs  Lab 09/08/20 0623  CHOL 189  TRIG 156*  150*  HDL 58  CHOLHDL 3.3  VLDL 31  LDLCALC 09/10/20*   HgbA1c:  Recent Labs  Lab 09/08/20 0623  HGBA1C 5.9*   Urine Drug Screen:  Recent Labs  Lab 09/06/20 1441  LABOPIA NONE DETECTED  COCAINSCRNUR POSITIVE*  LABBENZ NONE DETECTED  AMPHETMU NONE DETECTED  THCU NONE DETECTED  LABBARB NONE DETECTED    Alcohol Level  Recent Labs  Lab 09/06/20 1107  ETH <10    IMAGING past 24 hours No results found.  PHYSICAL EXAM    Middle-aged African-American male not in distress. . Afebrile. Head is nontraumatic. Neck is supple without bruit.    Cardiac exam no murmur or gallop. Lungs are clear to auscultation. Distal pulses are well felt. Neurological Exam :  Patient is awake and interactive.  Is oriented to place and  person.  He has severe dysarthria but can be understood.  He has complete ophthalmoplegia with skew eye deviation with right eye showing exotropia and hypertropia.  He has complete horizontal ophthalmoplegia with only trace vertical eye movements.  Pupils equal reactive.  Corneal reflex is preserved.  Fundi not visualized.  Mild bifacial weakness.  Tongue midline motor system exam is antigravity at least 4/5 strength in all 4 extremities though difficult to check fine motor skills due to lack of cooperation.  Subjective diminished sensation in the left body compared to the right.  Coordination slightly impaired on the left compared to the right.  Deep tendon reflex symmetric.  Plantars downgoing.  Gait not tested.   ASSESSMENT/PLAN Mr. Joshoa Shawler is a 54 y.o. male with history of HTN presenting with L sided weakness, double vision.   Stroke:  Central pontine hemorrhage in setting  of HTN and cocaine use  CTA head & neck 2.37mL pontine hemorrhage. Severe small vessel disease. Mild atherosclerosis head & neck. emphysema  CT head stable pontine hemorrhage. Extensive small vessel disease.   2D Echo EF 55-60%. No source of embolus   LDL 100  HgbA1c 5.9  VTE prophylaxis - SCDs   No antithrombotic prior to admission  Therapy recommendations:  CIR  Disposition:  pending   Transfer to floor   Transition to medical Hospitalists service today  Hypertensive Emergency  BP as high as 198/99 day of admission   Home meds:  Lisinopril 5, metoprolol 25 bid - has not taken in a while b/c he was out  Treated w/ Cleviprex drip, now off  SBP goal < 160  Resume PO meds as now has oral access . Long-term BP goal normotensive  Hyperlipidemia  Home meds:  None   LDL pending   Avoid statin in setting of acute hemorrhage  Dysphagia . Secondary to stroke . NPO . Cortrak placed today . Start TF . Speech on board   Other Stroke Risk Factors  Cigarette smoker, advised to stop  smoking  ETOH use, alcohol level <10, advised to drink no more than 2 drink(s) a day. On CIWA protocol.  Substance abuse - UDS:  Cocaine POSITIVE. Patient advised to stop using due to stroke risk.  Obesity, Body mass index is 30.04 kg/m., recommend weight loss, diet and exercise as appropriate   Other Active Problems  Thrombocytosis, PLT 413->431  Hospital day # 2 Continue strict blood pressure control with systolic goal below 160.  Weaned off  Cleviprex drip and start oral blood pressure medications through core track tube after it is placed today.  Mobilize out of bed.  Therapy consults.  Appreciate help from medical hospitalist team to help manage alcohol and substance abuse withdrawal.  Likely transfer to medical floor later today if bed available.  Discussed with Dr.Kc Dayna Barker.  Greater than 50% time during this 35-minute visit was spent on counseling and coordination of care about his pontine hemorrhage and dysarthria dysphagia and discussion with care team and answering questions Delia Heady, MD    To contact Stroke Continuity provider, please refer to [email or web address]. After hours, contact General Neurology

## 2020-09-08 NOTE — TOC Initial Note (Signed)
Transition of Care [facility]) - Initial/Assessment Note    Patient Details  Name: Zachary Flores MRN: [id number] Date of Birth: Jul 29, 1966  Transition of Care [facility]) CM/SW Contact:    Lawerance Sabal, RN Phone Number: 09/08/2020, 3:20 PM  Clinical Narrative:            Unable to complete assessment at bedside, due to patient condition. + cocaine, +CVA, is alert, awake oriented x1-2, B mittens. Patient has been agitated tachypneic diaphoretic needing Precedex drip, CIWA Ativan, and on intermittent antihypertensive drip. Per records, from home w brother, no PCP listed. TOC will continue to follow.       Expected Discharge Plan:  (TBD) Barriers to Discharge: Continued Medical Work up, Requiring sitter/restraints   Patient Goals and CMS Choice        Expected Discharge Plan and Services Expected Discharge Plan:  (TBD)                                              Prior Living Arrangements/Services                       Activities of Daily Living Home Assistive Devices/Equipment: None ADL Screening (condition at time of admission) Patient's cognitive ability adequate to safely complete daily activities?: Yes Is the patient deaf or have difficulty hearing?: No Does the patient have difficulty seeing, even when wearing glasses/contacts?: No Does the patient have difficulty concentrating, remembering, or making decisions?: No Patient able to express need for assistance with ADLs?: Yes Does the patient have difficulty dressing or bathing?: Yes Independently performs ADLs?: No Communication: Independent Dressing (OT): Needs assistance Is this a change from baseline?: Change from baseline, expected to last >3 days Grooming: Needs assistance Is this a change from baseline?: Change from baseline, expected to last >3 days Feeding: Needs assistance Is this a change from baseline?: Change from baseline, expected to last >3 days Bathing: Needs assistance Is this a  change from baseline?: Change from baseline, expected to last >3 days Toileting: Needs assistance Is this a change from baseline?: Change from baseline, expected to last >3days In/Out Bed: Needs assistance Is this a change from baseline?: Change from baseline, expected to last >3 days Walks in Home: Needs assistance Is this a change from baseline?: Change from baseline, expected to last >3 days Does the patient have difficulty walking or climbing stairs?: No  Permission Sought/Granted                  Emotional Assessment              Admission diagnosis:  Hemorrhagic stroke (HCC) [I61.9] ICH (intracerebral hemorrhage) (HCC) [I61.9] Dysarthria [R47.1] Left-sided weakness [R53.1] Left sided numbness [R20.0] Patient Active Problem List   Diagnosis Date Noted  . Alcohol withdrawal syndrome with complication, with unspecified complication (HCC)   . Hemorrhagic stroke (HCC) 09/06/2020  . TIA (transient ischemic attack) 06/30/2019  . Alcohol abuse with intoxication (HCC) 06/30/2019  . Elevated LFTs 06/30/2019   PCP:  Patient, No Pcp Per Pharmacy:   [facility] [phone] - [place], [place] - [address] AT [facility] [address] [place] [address] Phone: [phone] Fax: [phone]  [facility] [place], [place] - [address] [place] [address] Phone:  [phone] Fax: [phone]     Social Determinants of Health (SDOH) Interventions    Readmission Risk Interventions No flowsheet data found.

## 2020-09-08 NOTE — Evaluation (Addendum)
Physical Therapy Evaluation Patient Details Name: Zachary Flores MRN: [id number] DOB: 1966-03-14 Today's Date: 09/08/2020   History of Present Illness  Pt is a 54 yo male presenting with L sided numbness/weakness and dysarthria. CT revealed a pontine hemorrhage. PMH includes: polysubstance abuse (alcohol, cocaine), TIA, CVA, HTN  Clinical Impression  Patient presents with decreased mobility due to decreased balance, L sided weakness, decreased cognition, decreased activity tolerance and will benefit from skilled PT in the acute setting.  He needed +2 mod to max A for EOB activity this session.  Feel he will need CIR level rehab at d/c.     Follow Up Recommendations CIR    Equipment Recommendations  Other (comment) (to be assessed)    Recommendations for Other Services Rehab consult     Precautions / Restrictions Precautions Precautions: Fall Precaution Comments: pushes some to L; keep SBP 120-140      Mobility  Bed Mobility Overal bed mobility: Needs Assistance Bed Mobility: Supine to Sit;Sit to Supine     Supine to sit: +2 for physical assistance;Mod assist;HOB elevated Sit to supine: +2 for physical assistance;Max assist   General bed mobility comments: patient initiating to sit up, but needed lifting help for the trunk and assist for L LE off bed  Transfers                 General transfer comment: NT due to unsafe with poor sitting balance and BP initially out of parameters  Ambulation/Gait                Stairs            Wheelchair Mobility    Modified Rankin (Stroke Patients Only) Modified Rankin (Stroke Patients Only) Pre-Morbid Rankin Score: No significant disability Modified Rankin: Severe disability     Balance Overall balance assessment: Needs assistance Sitting-balance support: Feet supported Sitting balance-Leahy Scale: Zero Sitting balance - Comments: patient initially with mod A +2 for safety at EOB, then pusing posterior  needing max to total A to correct, then with max to mod A of 2 for EOB with assist for head to midline as leaning L and cues not to push with R UE, frequent redirection for focus on task for balance Postural control: Posterior lean;Left lateral lean                                   Pertinent Vitals/Pain Faces Pain Scale: No hurt    Home Living Family/patient expects to be discharged to:: Private residence Living Arrangements: Other relatives (brother)             Home Equipment: Gilmer Mor - single point Additional Comments: Patient unable to give details about home set up; does report he used a cane PTA    Prior Function           Comments: 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, 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     Higher education careers adviser        Extremity/Trunk Assessment   Upper Extremity Assessment Upper Extremity Assessment: Defer to OT evaluation    Lower Extremity Assessment Lower Extremity Assessment: LLE deficits/detail LLE Deficits / Details: did not extend L LE in sitting, but in supine able to flex at hip/knee and moved L ankle, but limited compared to R difficult to fully assess due to cognition    Cervical / Trunk Assessment Cervical / Trunk Assessment: Other exceptions Cervical /  Trunk Exceptions: keeping head tilted to L and keeping L side retracted seated EOB  Communication   Communication: Expressive difficulties (dysarthria)  Cognition Arousal/Alertness: Awake/alert Behavior During Therapy: Impulsive Overall Cognitive Status: Impaired/Different from baseline Area of Impairment: Orientation;Attention;Following commands;Problem solving;Safety/judgement                 Orientation Level: Disoriented to;Time;Place;Situation Current Attention Level: Focused   Following Commands: Follows one step commands with increased time;Follows one step commands consistently Safety/Judgement: Decreased awareness of deficits;Decreased awareness of safety    Problem Solving: Slow processing;Decreased initiation;Difficulty sequencing;Requires verbal cues;Requires tactile cues General Comments: fluctuated with orientation stating in hospital, then home, reported here due to shoulder issue, then stated he knew he had a stroke, reported year 1970's but then asking for a mask due to the "virus"      General Comments General comments (skin integrity, edema, etc.): visual limitations present, but some difficulty testing due to inconsistent reports, initially stated he could not see anything, then in sitting able to tell number of fingers with fairly good accuracy shown on L and R side; BP sitting 149/104, supine after activity 139/94    Exercises     Assessment/Plan    PT Assessment Patient needs continued PT services  PT Problem List Decreased strength;Decreased mobility;Decreased safety awareness;Decreased balance;Decreased knowledge of use of DME;Decreased cognition;Decreased knowledge of precautions       PT Treatment Interventions DME instruction;Therapeutic activities;Gait training;Therapeutic exercise;Patient/family education;Balance training;Functional mobility training;Cognitive remediation    PT Goals (Current goals can be found in the Care Plan section)  Acute Rehab PT Goals Patient Stated Goal: Agreeable to mobilize PT Goal Formulation: Patient unable to participate in goal setting Time For Goal Achievement: 09/22/20 Potential to Achieve Goals: Fair    Frequency Min 4X/week   Barriers to discharge        Co-evaluation PT/OT/SLP Co-Evaluation/Treatment: Yes Reason for Co-Treatment: Necessary to address cognition/behavior during functional activity;For patient/therapist safety;To address functional/ADL transfers PT goals addressed during session: Mobility/safety with mobility;Balance         AM-PAC PT "6 Clicks" Mobility  Outcome Measure Help needed turning from your back to your side while in a flat bed without using  bedrails?: A Lot Help needed moving from lying on your back to sitting on the side of a flat bed without using bedrails?: A Lot Help needed moving to and from a bed to a chair (including a wheelchair)?: Total Help needed standing up from a chair using your arms (e.g., wheelchair or bedside chair)?: Total Help needed to walk in hospital room?: Total Help needed climbing 3-5 steps with a railing? : Total 6 Click Score: 8    End of Session Equipment Utilized During Treatment: Oxygen Activity Tolerance: Treatment limited secondary to medical complications (Comment) (BP parameters) Patient left: in bed;with call bell/phone within reach;with bed alarm set Nurse Communication: Mobility status PT Visit Diagnosis: Other abnormalities of gait and mobility (R26.89);Other symptoms and signs involving the nervous system (R29.898);Hemiplegia and hemiparesis Hemiplegia - Right/Left: Left Hemiplegia - dominant/non-dominant: Non-dominant Hemiplegia - caused by: Nontraumatic intracerebral hemorrhage    Time: 1052-1119 PT Time Calculation (min) (ACUTE ONLY): 27 min   Charges:   PT Evaluation $PT Eval Moderate Complexity: 1 Mod          Zachary Flores, PT Acute Rehabilitation Services Pager:[phone] Office:[phone] 09/08/2020   Zachary Flores 09/08/2020, 2:12 PM

## 2020-09-08 NOTE — Evaluation (Signed)
Clinical/Bedside Swallow Evaluation Patient Details  Name: Zachary Flores MRN: [id number] Date of Birth: October 26, 1966  Today's Date: 09/08/2020 Time: SLP Start Time (ACUTE ONLY): 0841 SLP Stop Time (ACUTE ONLY): 0851 SLP Time Calculation (min) (ACUTE ONLY): 10 min  Past Medical History: No past medical history on file. Past Surgical History: No past surgical history on file. HPI:  Pt is a 54 yo male presenting with L sided numbness/weakness and dysarthria. CT revealed a pontine hemorrhage. PMH includes: polysubstance abuse (alcohol, cocaine), TIA, CVA, HTN   Assessment / Plan / Recommendation Clinical Impression  Pt's swallowing function appears to be most impacted by his mentation right now, with fluctuating LOA and poor awareness to know when there is or is not a bolus in his mouth. He frequently tries to start sucking on a straw before it is at his mouth, and then needs Max cues to open his mouth to allow the straw to get in. He continues to try to orally manipulate pureed boluses even after he's swallowed them, with yankauer used to confirm that the bolus was in fact gone. He seems to have good potential for swallowing though with no overt coughing and only mild throat clearing as he is challenged with larger, sequential sips of water. Recommend that he remain NPO with consideration for temporary, alternative means of nutrition until his mentation begins to improve. Until then, would offer a few single pieces of ice after oral care and only if he is fully alert.   SLP Visit Diagnosis: Dysphagia, unspecified (R13.10)    Aspiration Risk  Moderate aspiration risk    Diet Recommendation NPO;Alternative means - temporary (could have a few pieces of ice after oral care if alert)   Medication Administration: Via alternative means (could crush in puree PRN if alert)    Other  Recommendations Oral Care Recommendations: Oral care QID Other Recommendations: Have oral suction available   Follow up  Recommendations Inpatient Rehab      Frequency and Duration min 2x/week  2 weeks       Prognosis Prognosis for Safe Diet Advancement: Good Barriers to Reach Goals: Cognitive deficits      Swallow Study   General HPI: Pt is a 54 yo male presenting with L sided numbness/weakness and dysarthria. CT revealed a pontine hemorrhage. PMH includes: polysubstance abuse (alcohol, cocaine), TIA, CVA, HTN Type of Study: Bedside Swallow Evaluation Previous Swallow Assessment: none in chart Diet Prior to this Study: NPO Temperature Spikes Noted: No Respiratory Status: Room air History of Recent Intubation: No Behavior/Cognition: Cooperative;Requires cueing;Other (Comment) (fluctuating alertness) Oral Cavity Assessment: Within Functional Limits Oral Cavity - Dentition: Adequate natural dentition Self-Feeding Abilities: Total assist Patient Positioning: Upright in bed Baseline Vocal Quality: Low vocal intensity (mild) Volitional Cough: Weak Volitional Swallow: Unable to elicit    Oral/Motor/Sensory Function Overall Oral Motor/Sensory Function: Generalized oral weakness (no definitive asymmetry, difficulty following commands tho)   Ice Chips Ice chips: Within functional limits Presentation: Spoon   Thin Liquid Thin Liquid: Impaired Presentation: Spoon;Straw Oral Phase Impairments: Poor awareness of bolus Pharyngeal  Phase Impairments: Throat Clearing - Immediate;Wet Vocal Quality    Nectar Thick Nectar Thick Liquid: Not tested   Honey Thick Honey Thick Liquid: Not tested   Puree Puree: Impaired Presentation: Spoon Oral Phase Impairments: Poor awareness of bolus   Solid     Solid: Not tested      Mahala Menghini., M.A. CCC-SLP Acute Rehabilitation Services Pager [phone] Office [phone]  09/08/2020,9:07 AM

## 2020-09-08 NOTE — Consult Note (Signed)
Physical Medicine and Rehabilitation Consult Reason for Consult: Left side weakness and dysarthria Referring Physician: Olegario Messier   HPI: Zachary Flores is a 54 y.o. right-handed male with history of hypertension as well as alcohol use. Per chart review patient lives with brother. Works at [employer]. He is a straight point cane. Presented 09/06/2020 left-sided weakness and slurred speech. 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 Severe chronic small vessel ischemic disease. Admission chemistries glucose 188, SARS, urine drug screen positive cocaine. Coronavirus negative. Neurology follow-up with conservative care monitoring of blood pressure. Patient n.p.o. with alternative means of nutritional support. Bouts of agitation and restlessness with restraints for safety. Therapy evaluations completed with recommendations of physical medicine rehab consult.   Review of Systems  Constitutional: Negative for fever.  HENT: Negative for hearing loss.   Eyes: Negative for blurred vision and double vision.  Respiratory: Negative for cough and shortness of breath.   Cardiovascular: Negative for chest pain and leg swelling.  Gastrointestinal: Positive for constipation. Negative for heartburn, nausea and vomiting.  Genitourinary: Negative for dysuria, flank pain and hematuria.  Musculoskeletal: Positive for myalgias.  Skin: Negative for rash.  Neurological: Positive for speech change and weakness.  All other systems reviewed and are negative.  No past medical history on file. No past surgical history on file. Family History  Family history unknown: Yes   Social History:  reports that he has been smoking. He has never used smokeless tobacco. He reports current alcohol use of about 24.0 standard drinks of alcohol per week. No history on file for drug use. Allergies: No Known Allergies Medications Prior to Admission  Medication Sig Dispense  Refill  . lisinopril (ZESTRIL) 5 MG tablet Take 1 tablet (5 mg total) by mouth daily. 30 tablet 0  . metoprolol tartrate (LOPRESSOR) 25 MG tablet Take 1 tablet (25 mg total) by mouth 2 (two) times daily. 60 tablet 0    Home: Home Living Family/patient expects to be discharged to:: Private residence Living Arrangements: Other relatives (brother) Home Equipment: Gilmer Mor - single point Additional Comments: Patient unable to give details about home set up; does report he used a cane PTA  Functional History: Prior Function Comments: 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, 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; reports being mod independent using SPC but unsure of accuracy  Functional Status:  Mobility: Bed Mobility Overal bed mobility: Needs Assistance Bed Mobility: Supine to Sit, Sit to Supine Supine to sit: +2 for physical assistance, Mod assist, HOB elevated Sit to supine: +2 for physical assistance, Max assist General bed mobility comments: patient initiating to sit up, but needed lifting help for the trunk and assist for L LE off bed Transfers General transfer comment: NT due to unsafe with poor sitting balance and BP initially out of parameters      ADL: ADL Overall ADL's : Needs assistance/impaired Eating/Feeding: NPO Grooming: Maximal assistance, Sitting, Wash/dry face Grooming Details (indicate cue type and reason): significant assist for sitting balance  EOB (maxA) with cues to initiate and perform basic ADL task General ADL Comments: pt requiring totalA for additional ADL at this time   Cognition: Cognition Overall Cognitive Status: Impaired/Different from baseline Arousal/Alertness:  (fluctuating) Orientation Level: Oriented to person, Disoriented to place, Disoriented to time, Disoriented to situation Attention: Focused, Sustained Focused Attention: Impaired Focused Attention Impairment: Functional basic Sustained Attention: Impaired Sustained Attention Impairment: Verbal basic,  Functional basic Memory: Impaired  Memory Impairment: Storage deficit, Retrieval deficit, Decreased recall of new information, Decreased short term memory Decreased Short Term Memory: Verbal basic Awareness: Impaired Awareness Impairment: Emergent impairment Problem Solving: Impaired Problem Solving Impairment: Functional basic, Verbal basic Safety/Judgment: Impaired Cognition Arousal/Alertness: Awake/alert Behavior During Therapy: Impulsive Overall Cognitive Status: Impaired/Different from baseline Area of Impairment: Orientation, Attention, Following commands, Problem solving, Safety/judgement Orientation Level: Disoriented to, Time, Place, Situation Current Attention Level: Focused Following Commands: Follows one step commands with increased time, Follows one step commands consistently Safety/Judgement: Decreased awareness of deficits, Decreased awareness of safety Problem Solving: Slow processing, Decreased initiation, Difficulty sequencing, Requires verbal cues, Requires tactile cues General Comments: fluctuated with orientation stating in hospital, then home, reported here due to shoulder issue, then stated he knew he had a stroke, reported year 1970's but then asking for a mask due to the "virus"  Blood pressure 117/80, pulse 89, temperature 98.9 F (37.2 C), temperature source Axillary, resp. rate (!) 24, weight 87 kg, SpO2 99 %. Physical Exam General: Alert, No apparent distress HEENT: Right eye exotropia, Nasogastric tube in place Neck: Supple without JVD or lymphadenopathy Heart: Reg rate and rhythm. No murmurs rubs or gallops Chest: CTA bilaterally without wheezes, rales, or rhonchi; no distress Abdomen: Soft, non-tender, non-distended, bowel sounds positive. Extremities: left hand mitt in place Skin: Clean and intact without signs of breakdown Neuro: Patient is alert and restless. Makes eye contact with examiner. Provides his name and age. Dysarthric. Appears to have 4/5  strength throughout.  Psych: Pt's affect is pleasant. Pt is cooperative, very polite GU: foley in place    Results for orders placed or performed during the hospital encounter of 09/06/20 (from the past 24 hour(s))  Hemoglobin A1c     Status: Abnormal   Collection Time: 09/08/20  6:23 AM  Result Value Ref Range   Hgb A1c MFr Bld 5.9 (H) 4.8 - 5.6 %   Mean Plasma Glucose 122.63 mg/dL  Lipid panel     Status: Abnormal   Collection Time: 09/08/20  6:23 AM  Result Value Ref Range   Cholesterol 189 0 - 200 mg/dL   Triglycerides 315 (H) <150 mg/dL   HDL 58 >17 mg/dL   Total CHOL/HDL Ratio 3.3 RATIO   VLDL 31 0 - 40 mg/dL   LDL Cholesterol 616 (H) 0 - 99 mg/dL  Triglycerides     Status: Abnormal   Collection Time: 09/08/20  6:23 AM  Result Value Ref Range   Triglycerides 150 (H) <150 mg/dL   CT HEAD WO CONTRAST  Result Date: 09/07/2020 CLINICAL DATA:  Follow-up parenchymal hemorrhage EXAM: CT HEAD WITHOUT CONTRAST TECHNIQUE: Contiguous axial images were obtained from the base of the skull through the vertex without intravenous contrast. COMPARISON:  Head CT from yesterday FINDINGS: Brain: Upper brainstem hematoma which is best visualized on sagittal images for reproducible measurement, unchanged in size at 21 by 14 mm. No visible adjacent brain edema. No visible intra ventricular extension. Extensive chronic small vessel ischemia in the cerebral white matter for age. Chronic lacune at the right corona radiata. Vascular: No hyperdense vessel or unexpected calcification. Skull: Negative Sinuses/Orbits: Negative IMPRESSION: 1. Size stable upper brainstem hematoma. No intraventricular extension or hydrocephalus. 2. Extensive chronic small vessel ischemia. Electronically Signed   By: Marnee Spring M.D.   On: 09/07/2020 06:08     Assessment/Plan: Diagnosis: Central pontine hemorrhage 1. Does the need for close, 24 hr/day medical supervision in concert with the patient's rehab needs make it  unreasonable for  this patient to be served in a less intensive setting? Yes 2. Co-Morbidities requiring supervision/potential complications: hypertensive emergency, HLD, dysphagia requiring cortrak, ETOH use, UDS positive for cocaine, obesity (BMI 30.04) 3. Due to bladder management, bowel management, safety, skin/wound care, disease management, medication administration, pain management and patient education, does the patient require 24 hr/day rehab nursing? Yes 4. Does the patient require coordinated care of a physician, rehab nurse, therapy disciplines of PT, OT, SLP to address physical and functional deficits in the context of the above medical diagnosis(es)? Yes Addressing deficits in the following areas: balance, endurance, locomotion, strength, transferring, bowel/bladder control, bathing, dressing, feeding, grooming, toileting, cognition, speech, swallowing and psychosocial support 5. Can the patient actively participate in an intensive therapy program of at least 3 hrs of therapy per day at least 5 days per week? Yes 6. The potential for patient to make measurable gains while on inpatient rehab is good 7. Anticipated functional outcomes upon discharge from inpatient rehab are min assist  with PT, min assist with OT, min assist with SLP. 8. Estimated rehab length of stay to reach the above functional goals is: 2-3 weeks 9. Anticipated discharge destination: Home 10. Overall Rehab/Functional Prognosis: good  RECOMMENDATIONS: This patient's condition is appropriate for continued rehabilitative care in the following setting: CIR Patient has agreed to participate in recommended program. Yes Note that insurance prior authorization may be required for reimbursement for recommended care.  Comment: Zachary Flores would be an excellent CIR candidate if 24/7 supervision can be confirmed. Thank you for this consult. Admission coordinator to follow.   I have personally performed a face to face  diagnostic evaluation, including, but not limited to relevant history and physical exam findings, of this patient and developed relevant assessment and plan.  Additionally, I have reviewed and concur with the physician assistant's documentation above.  Sula Soda, MD  Mcarthur Rossetti Angiulli, PA-C 09/08/2020

## 2020-09-08 NOTE — Progress Notes (Signed)
Initial Nutrition Assessment  DOCUMENTATION CODES:   Not applicable  INTERVENTION:  Initiate TF via Cortrak NGT with Osmolite 1.2 at goal rate of 70 ml/h (1680 ml per day).  Provide 45 ml Prosource TF BID per tube  Tube feeding to provide 2096 kcals, 115 gm protein, 1378 ml free water daily.  NUTRITION DIAGNOSIS:   Inadequate oral intake related to inability to eat as evidenced by NPO status.  GOAL:   Patient will meet greater than or equal to 90% of their needs  MONITOR:   Skin, TF tolerance, Weight trends, Labs, I & O's, Diet advancement  REASON FOR ASSESSMENT:   Consult Enteral/tube feeding initiation and management  ASSESSMENT:   54 year old male with history of hypertension, polysubstance abuse (ETOH, cocaine) presents with left-sided weakness. 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  Pt unable to provide nutrition history at time of visit. Per MD, pt with delirium, acute toxic metabolic encephalopathy, withdrawal. Pt with poor awareness and mentation and currently NPO from SLP evaluation. Plans for tube feeding until mentation improves. Cortrak NGT placed today with tip of tube in stomach. RD to order tube feeding.   NUTRITION - FOCUSED PHYSICAL EXAM:    Most Recent Value  Orbital Region No depletion  Upper Arm Region No depletion  Thoracic and Lumbar Region No depletion  Buccal Region No depletion  Temple Region No depletion  Clavicle Bone Region No depletion  Clavicle and Acromion Bone Region No depletion  Scapular Bone Region Unable to assess  Dorsal Hand Unable to assess  Patellar Region No depletion  Anterior Thigh Region No depletion  Posterior Calf Region No depletion  Edema (RD Assessment) None  Hair Reviewed  Eyes Reviewed  Mouth Reviewed  Skin Reviewed  Nails Reviewed     Labs and medications reviewed.   Diet Order:   Diet Order            Diet NPO time specified  Diet effective now                 EDUCATION NEEDS:   Not  appropriate for education at this time  Skin:  Skin Assessment: Reviewed RN Assessment  Last BM:  9/14  Height:   Ht Readings from Last 1 Encounters:  06/30/19 5\' 7"  (1.702 m)    Weight:   Wt Readings from Last 1 Encounters:  09/06/20 87 kg    BMI:  Body mass index is 30.04 kg/m.  Estimated Nutritional Needs:   Kcal:  2050-2250  Protein:  105-115 grams  Fluid:  >/= 2 L/day   07-27-1979, MS, RD, LDN RD pager number/after hours weekend pager number on Amion.

## 2020-09-08 NOTE — Progress Notes (Signed)
Inpatient Rehab Admissions Coordinator Note:   Per therapy recommendations, pt was screened for CIR candidacy by Estill Dooms, PT, DPT.  At this time we are recommending a CIR consult and I will place an order per our protocol.  Please contact me with questions.   Estill Dooms, PT, DPT [phone] 09/08/20 2:38 PM

## 2020-09-08 NOTE — Procedures (Signed)
Cortrak  Person Inserting Tube:  Belvin Gauss C, RD Tube Type:  Cortrak - 43 inches Tube Location:  Left nare Initial Placement:  Stomach Secured by: Bridle Technique Used to Measure Tube Placement:  Documented cm marking at nare/ corner of mouth Cortrak Secured At:  68 cm    Cortrak Tube Team Note:  Consult received to place a Cortrak feeding tube.   No x-ray is required. RN may begin using tube.   If the tube becomes dislodged please keep the tube and contact the Cortrak team at [email or web address] (password [email or web address]) for replacement.  If after hours and replacement cannot be delayed, place a NG tube and confirm placement with an abdominal x-ray.    Lorris Carducci P., RD, LDN, CNSC See AMiON for contact information    

## 2020-09-08 NOTE — Evaluation (Addendum)
Occupational Therapy Evaluation Patient Details Name: Zachary Flores MRN: [id number] DOB: [birth year] Today's Date: 09/08/2020    History of Present Illness Pt is a 54 yo male presenting with L sided numbness/weakness and dysarthria. CT revealed a pontine hemorrhage. PMH includes: polysubstance abuse (alcohol, cocaine), TIA, CVA, HTN   Clinical Impression   This 54 y/o male presents with the above. Pt with dysarthric speech and waxing/waning cognition so difficult to obtain full PLOF - per pt he was mod independent with [facility] and living with his brother. Pt agreeable to working with therapies, presenting with deficits including L side weakness, impaired cognition, poor sitting balance all impacting his functional performance. Pt requiring two person assist for safe completion of bed mobility, requiring overall maxA for balance EOB given pt pushing to the L while upright. Pt following basic commands but inconsistently, often requiring multimodal cues to complete. He will benefit from continued acute OT services and currently recommend post acute rehab services to maximize his overall safety and independence with ADL and mobility.   BP sitting EOB 140/107 Once returned to supine 139/96 SpO2 100% on 3.5-4L O2     Follow Up Recommendations  CIR    Equipment Recommendations  Other (comment);3 in 1 bedside commode (TBD)    Recommendations for Other Services Rehab consult     Precautions / Restrictions Precautions Precautions: Fall Precaution Comments: pushes some to L; keep SBP 120-140 Restrictions Weight Bearing Restrictions: No      Mobility Bed Mobility Overal bed mobility: Needs Assistance Bed Mobility: Supine to Sit;Sit to Supine     Supine to sit: +2 for physical assistance;Mod assist;HOB elevated Sit to supine: +2 for physical assistance;Max assist   General bed mobility comments: patient initiating to sit up, but needed lifting help for the trunk and assist for L LE off  bed  Transfers                 General transfer comment: NT due to unsafe with poor sitting balance and BP initially out of parameters    Balance Overall balance assessment: Needs assistance Sitting-balance support: Feet supported Sitting balance-Leahy Scale: Zero Sitting balance - Comments: patient initially with mod A +2 for safety at EOB, then pusing posterior needing max to total A to correct, then with max to mod A of 2 for EOB with assist for head to midline as leaning L and cues not to push with R UE, frequent redirection for focus on task for balance Postural control: Posterior lean;Left lateral lean                                 ADL either performed or assessed with clinical judgement   ADL Overall ADL's : Needs assistance/impaired Eating/Feeding: NPO   Grooming: Maximal assistance;Sitting;Wash/dry face Grooming Details (indicate cue type and reason): significant assist for sitting balance  EOB (maxA) with cues to initiate and perform basic ADL task                               General ADL Comments: pt requiring totalA for additional ADL at this time      Vision   Vision Assessment?: Vision impaired- to be further tested in functional context Additional Comments: when in supine and pt opening eyes he is unable to locate therapist placed to his L side or to see therapist's hand waving despite max  cues; when seated EOB pt able to accurately identify number of digits therapist holding up in all visual fields     Perception     Praxis      Pertinent Vitals/Pain Pain Assessment: Faces Faces Pain Scale: No hurt Pain Intervention(s): Monitored during session     Hand Dominance Right   Extremity/Trunk Assessment Upper Extremity Assessment Upper Extremity Assessment: Difficult to assess due to impaired cognition;Generalized weakness;LUE deficits/detail LUE Deficits / Details: LUE noted grossly weaker than RUE, though does attempt to  utilize for purposeful movements  LUE Coordination: decreased fine motor;decreased gross motor   Lower Extremity Assessment Lower Extremity Assessment: Defer to PT evaluation LLE Deficits / Details: did not extend L LE in sitting, but in supine able to flex at hip/knee and moved L ankle, but limited compared to R difficult to fully assess due to cognition   Cervical / Trunk Assessment Cervical / Trunk Assessment: Other exceptions Cervical / Trunk Exceptions: keeping head tilted to L and keeping L side retracted seated EOB   Communication Communication Communication: Expressive difficulties (dysarthria)   Cognition Arousal/Alertness: Awake/alert Behavior During Therapy: Impulsive Overall Cognitive Status: Impaired/Different from baseline Area of Impairment: Orientation;Attention;Following commands;Problem solving;Safety/judgement                 Orientation Level: Disoriented to;Time;Place;Situation Current Attention Level: Focused   Following Commands: Follows one step commands with increased time;Follows one step commands consistently Safety/Judgement: Decreased awareness of deficits;Decreased awareness of safety   Problem Solving: Slow processing;Decreased initiation;Difficulty sequencing;Requires verbal cues;Requires tactile cues General Comments: fluctuated with orientation stating in hospital, then home, reported here due to shoulder issue, then stated he knew he had a stroke, reported year 1970's but then asking for a mask due to the "virus"   General Comments  visual limitations present, but some difficulty testing due to inconsistent reports, initially stated he could not see anything, then in sitting able to tell number of fingers with fairly good accuracy shown on L and R side; BP sitting 149/104, supine after activity 139/94    Exercises     Shoulder Instructions      Home Living Family/patient expects to be discharged to:: Private residence Living Arrangements:  Other relatives (brother)                           Home Equipment: Gilmer Mor - single point   Additional Comments: Patient unable to give details about home set up; does report he used a cane PTA      Prior Functioning/Environment          Comments: 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, 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; reports being mod independent using SPC but unsure of accuracy         OT Problem List: Decreased strength;Decreased range of motion;Decreased activity tolerance;Impaired balance (sitting and/or standing);Impaired vision/perception;Decreased coordination;Decreased cognition;Decreased safety awareness;Decreased knowledge of use of DME or AE;Decreased knowledge of precautions;Impaired UE functional use      OT Treatment/Interventions: Self-care/ADL training;Therapeutic exercise;Energy conservation;DME and/or AE instruction;Therapeutic activities;Cognitive remediation/compensation;Patient/family education;Visual/perceptual remediation/compensation;Balance training    OT Goals(Current goals can be found in the care plan section) Acute Rehab OT Goals Patient Stated Goal: Agreeable to mobilize OT Goal Formulation: With patient Time For Goal Achievement: 09/22/20 Potential to Achieve Goals: Good ADL Goals Pt Will Perform Grooming: with min assist;sitting Additional ADL Goal #1: Pt will sustain sitting balance EOB >74min with minA as precursor to ADL. Additional ADL Goal #  2: Pt will sustain attention to ADL/functional task >5 min with no more than min cues. Additional ADL Goal #3: Pt will follow 1 step commands with >50% accuracy during functional task.  OT Frequency: Min 2X/week   Barriers to D/C:            Co-evaluation PT/OT/SLP Co-Evaluation/Treatment: Yes Reason for Co-Treatment: Necessary to address cognition/behavior during functional activity;For patient/therapist safety;To address functional/ADL transfers PT goals addressed during session: Mobility/safety  with mobility;Balance OT goals addressed during session: Strengthening/ROM;ADL's and self-care      AM-PAC OT "6 Clicks" Daily Activity     Outcome Measure Help from another person eating meals?: Total Help from another person taking care of personal grooming?: A Lot Help from another person toileting, which includes using toliet, bedpan, or urinal?: Total Help from another person bathing (including washing, rinsing, drying)?: Total Help from another person to put on and taking off regular upper body clothing?: Total Help from another person to put on and taking off regular lower body clothing?: Total 6 Click Score: 7   End of Session Equipment Utilized During Treatment: Oxygen Nurse Communication: Mobility status  Activity Tolerance: Patient tolerated treatment well Patient left: in bed;with call bell/phone within reach;with bed alarm set;with restraints reapplied  OT Visit Diagnosis: Other abnormalities of gait and mobility (R26.89);Other symptoms and signs involving cognitive function;Other symptoms and signs involving the nervous system (R29.898);Low vision, both eyes (H54.2)                Time: 4128-7867 OT Time Calculation (min): 29 min Charges:  OT General Charges $OT Visit: 1 Visit OT Evaluation $OT Eval High Complexity: 1 High  Marcy Siren, OT Acute Rehabilitation Services Pager [phone] Office [phone]   Orlando Penner 09/08/2020, 2:24 PM

## 2020-09-08 NOTE — Progress Notes (Signed)
PROGRESS NOTE    Zachary Flores  FAO:[id number] DOB: 04-22-1966 DOA: 09/06/2020 PCP: Patient, No Pcp Per   Chief Complaint  Patient presents with  . 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 TRH was consulted 9/14 for medical management. Patient has been agitated tachypneic diaphoretic needing Precedex drip CIWA Ativan and on intermittent antihypertensive drip.  Subjective: Seen this morning mitten in place, is alert, awake oriented x1-2. Awaiting for speech eval.  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  Continue with PT, OT speech eval for p.o. diet.  Resume metoprolol, lisinopril which he has not been taking as he was out of.  Once okay with speech.  Continue plan of care as per neurology and will accept the patient once out of ICU.  He will likely need inpatient rehab  Delirium/acute toxic metabolic encephalopathy/alcohol withdrawal syndrome with complication: Initially on Precedex, off of it.  Continue supportive care, fall precaution, as needed sedative/anxiolytics, thiamine folate, can start CIWA ativan.  Cocaine positive on UDS will need extensive counseling once no more confused  DVT prophylaxis: SCD's Start: 09/06/20 1324 no anticoagulation due to pontine hemorrhage Code Status:   Code Status: Full Code    Family Communication: plan of care discussed with patient at bedside.  Status is: Inpatient Remains inpatient appropriate because:Unsafe d/c plan, IV treatments appropriate due to intensity of illness or inability to take PO and Inpatient level of care appropriate due to severity of illness  Dispo: The patient is from: Home              Anticipated d/c is to: CIR              Anticipated d/c date is: 3 days              Patient currently is not medically stable to d/c.  Diet Order            Diet NPO time specified  Diet effective now                 Body mass index is 30.04 kg/m.  Consultants:see note  Procedures:see note Microbiology:see note Blood Culture No results found for: SDES, SPECREQUEST, CULT, REPTSTATUS  Other culture-see note  Medications: Scheduled Meds: .  stroke: mapping our early stages of recovery book   Does not apply Once  . chlorhexidine  15 mL Mouth Rinse BID  . Chlorhexidine Gluconate Cloth  6 each Topical Daily  . [START ON 09/09/2020] folic acid  1 mg Per Tube Daily  . insulin aspart  0-15 Units Subcutaneous Q4H  . lisinopril  5 mg Per Tube Daily  . mouth rinse  15 mL Mouth Rinse q12n4p  . metoprolol tartrate  25 mg Per Tube BID  . [START ON 09/09/2020] multivitamin with minerals  1 tablet Per Tube Daily  . pantoprazole  sodium  40 mg Per Tube Daily  . senna-docusate  1 tablet Per Tube BID  . [START ON 09/09/2020] thiamine  100 mg Per Tube Daily   Continuous Infusions: . feeding supplement (OSMOLITE 1.2 CAL)      Antimicrobials: Anti-infectives (From admission, onward)   None     Objective: Vitals: Today's Vitals   09/08/20 1100 09/08/20 1140 09/08/20 1200 09/08/20 1300  BP: (!) 134/95  127/87 117/80  Pulse:   88 89  Resp: (!) 23  (!) 28 (!) 24  Temp:  98.9 F (37.2 C)    TempSrc:  Axillary Axillary   SpO2:   100% 99%  Weight:      PainSc:        Intake/Output Summary (Last 24 hours) at 09/08/2020 1446 Last data filed at  09/08/2020 1100 Gross per 24 hour  Intake 288.14 ml  Output 1325 ml  Net -1036.86 ml   Filed Weights   09/06/20 2300  Weight: 87 kg   Weight change:   Intake/Output from previous day: 09/14 0701 - 09/15 0700 In: 458.6 [I.V.:458.6] Out: 1325 [Urine:1325] Intake/Output this shift: No intake/output data recorded. Examination: General exam: AAOx1-2 ,NAD, weak appearing. HEENT:Oral mucosa moist, Ear/Nose WNL grossly,dentition normal. Respiratory system: bilaterally clear,no wheezing or crackles,no use of accessory muscle, non tender. Cardiovascular system: S1 & S2 +, regular, No JVD. Gastrointestinal system: Abdomen soft, NT,ND, BS+. Nervous System:Alert, awake, moving extremities and grossly nonfocal Extremities: No edema, distal peripheral pulses palpable.  Skin: No rashes,no icterus. MSK: Normal muscle bulk,tone, power  Data Reviewed: I have personally reviewed following labs and imaging studies CBC: Recent Labs  Lab 09/06/20 1107 09/06/20 1143 09/07/20 0849  WBC 8.2  --  9.2  NEUTROABS 5.3  --   --   HGB 14.9 16.3 15.5  HCT 46.5 48.0 47.7  MCV 90.3  --  89.0  PLT 413*  --  431*   Basic Metabolic Panel: Recent Labs  Lab 09/06/20 1107 09/06/20 1143 09/07/20 0849  NA 136 136  --   K 4.9 3.8  --   CL 99 101  --   CO2 25  --   --   GLUCOSE 188* 162*  --   BUN 7 7  --   CREATININE 0.97 0.80  --   CALCIUM 9.2  --   --   MG  --   --  2.2  PHOS  --   --  4.2   GFR: CrCl cannot be calculated (Unknown ideal weight.). Liver Function Tests: Recent Labs  Lab 09/06/20 1107  AST 61*  ALT 45*  ALKPHOS 73  BILITOT 1.1  PROT 7.1  ALBUMIN 4.2   No results for input(s): LIPASE, AMYLASE in the last 168 hours. No results for input(s): AMMONIA in the last 168 hours. Coagulation Profile: Recent Labs  Lab 09/06/20 1211  INR 0.9   Cardiac Enzymes: No results for input(s): CKTOTAL, CKMB, CKMBINDEX, TROPONINI in the last 168 hours. BNP (last 3 results) No results  for input(s): PROBNP in the last 8760 hours. HbA1C: Recent Labs    09/08/20 0623  HGBA1C 5.9*   CBG: Recent Labs  Lab 09/06/20 1102  GLUCAP 180*   Lipid Profile: Recent Labs    09/08/20 0623  CHOL 189  HDL 58  LDLCALC 100*  TRIG 156*  150*  CHOLHDL 3.3   Thyroid Function Tests: No results for input(s): TSH, T4TOTAL, FREET4, T3FREE, THYROIDAB in the last 72 hours. Anemia Panel: No results for input(s):  VITAMINB12, FOLATE, FERRITIN, TIBC, IRON, RETICCTPCT in the last 72 hours. Sepsis Labs: No results for input(s): PROCALCITON, LATICACIDVEN in the last 168 hours.  Recent Results (from the past 240 hour(s))  SARS Coronavirus 2 by RT PCR (hospital order, performed in [facility] hospital lab) Nasopharyngeal Nasopharyngeal Swab     Status: None   Collection Time: 09/06/20 11:08 AM   Specimen: Nasopharyngeal Swab  Result Value Ref Range Status   SARS Coronavirus 2 NEGATIVE NEGATIVE Final    Comment: (NOTE) SARS-CoV-2 target nucleic acids are NOT DETECTED.  The SARS-CoV-2 RNA is generally detectable in upper and lower respiratory specimens during the acute phase of infection. The lowest concentration of SARS-CoV-2 viral copies this assay can detect is 250 copies / mL. A negative result does not preclude SARS-CoV-2 infection and should not be used as the sole basis for treatment or other patient management decisions.  A negative result may occur with improper specimen collection / handling, submission of specimen other than nasopharyngeal swab, presence of viral mutation(s) within the areas targeted by this assay, and inadequate number of viral copies (<250 copies / mL). A negative result must be combined with clinical observations, patient history, and epidemiological information.  Fact Sheet for Patients:   BoilerBrush.com.cy  Fact Sheet for Healthcare Providers: https://pope.com/  This test is not yet approved or   cleared by the [place] FDA and has been authorized for detection and/or diagnosis of SARS-CoV-2 by FDA under an Emergency Use Authorization (EUA).  This EUA will remain in effect (meaning this test can be used) for the duration of the COVID-19 declaration under Section 564(b)(1) of the Act, 21 U.S.C. section 360bbb-3(b)(1), unless the authorization is terminated or revoked sooner.  Performed at [facility], [address] [place], [place] [address]      Radiology Studies: CT HEAD WO CONTRAST  Result Date: 09/07/2020 CLINICAL DATA:  Follow-up parenchymal hemorrhage EXAM: CT HEAD WITHOUT CONTRAST TECHNIQUE: Contiguous axial images were obtained from the base of the skull through the vertex without intravenous contrast. COMPARISON:  Head CT from yesterday FINDINGS: Brain: Upper brainstem hematoma which is best visualized on sagittal images for reproducible measurement, unchanged in size at 21 by 14 mm. No visible adjacent brain edema. No visible intra ventricular extension. Extensive chronic small vessel ischemia in the cerebral white matter for age. Chronic lacune at the right corona radiata. Vascular: No hyperdense vessel or unexpected calcification. Skull: Negative Sinuses/Orbits: Negative IMPRESSION: 1. Size stable upper brainstem hematoma. No intraventricular extension or hydrocephalus. 2. Extensive chronic small vessel ischemia. Electronically Signed   By: Marnee Spring M.D.   On: 09/07/2020 06:08     LOS: 2 days   Lanae Boast, MD [facility]  09/08/2020, 2:46 PM

## 2020-09-09 LAB — GLUCOSE, CAPILLARY
Glucose-Capillary: 131 mg/dL — ABNORMAL HIGH (ref 70–99)
Glucose-Capillary: 111 mg/dL — ABNORMAL HIGH (ref 70–99)
Glucose-Capillary: 121 mg/dL — ABNORMAL HIGH (ref 70–99)
Glucose-Capillary: 140 mg/dL — ABNORMAL HIGH (ref 70–99)
Glucose-Capillary: 148 mg/dL — ABNORMAL HIGH (ref 70–99)
Glucose-Capillary: 136 mg/dL — ABNORMAL HIGH (ref 70–99)

## 2020-09-09 LAB — MRSA PCR SCREENING: MRSA by PCR: NEGATIVE

## 2020-09-09 MED ORDER — QUETIAPINE FUMARATE 25 MG PO TABS
12.5000 mg | ORAL_TABLET | Freq: Every day | ORAL | Status: DC
  Administered 2020-09-09: 12.5 mg

## 2020-09-09 MED ORDER — QUETIAPINE FUMARATE 25 MG PO TABS
12.5000 mg | ORAL_TABLET | Freq: Every day | ORAL | Status: DC
  Filled 2020-09-09: qty 1

## 2020-09-09 NOTE — Progress Notes (Signed)
PROGRESS NOTE    Zachary Flores  UEK:[id number] DOB: Nov 13, 1966 DOA: 09/06/2020 PCP: Patient, No Pcp Per   Chief Complaint  Patient presents with  . 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 TRH was consulted 9/14 for medical management. Patient has been agitated tachypneic diaphoretic needing Precedex drip CIWA Ativan and on intermittent antihypertensive drip. 9/15-transferred from 4 N. to 3 W. 9/16: Intermittently anxious agitated, mitten in place, received Haldol previous night. Transferred to [facility]  Subjective:  Seen this morning mitten in place is alert awake able to tell me the location that he is here due to bleeding in his brain.  Patient held her last night, intermittently irritated/mitten in place  To save foley and cortrak. He has dysarthria, core track tube in place.  Blood pressure stable.  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  Blood pressure controlled on metoprolol and lisinopril.  Core track tube in place speech following.He has been transferred to [facility] service today from neurology and will need inpatient rehab.    Delirium/acute toxic metabolic encephalopathy/alcohol withdrawal syndrome with complication: Initially on Precedex.  Received Haldol  Last night.Will start on low-dose Seroquel tonight-which  should be temporary and need to be weaned off/discontinued once his mental status improves.  Cocaine positive on UDS will need extensive counseling once no more confused  Urine retention: patient had a coud catheter which is in place.  Add Flomax  DVT prophylaxis: SCD's Start: 09/06/20 1324 no anticoagulation due to pontine hemorrhage Code Status:   Code Status: Full Code  Family Communication: plan of care discussed with patient at bedside.  Discussed with neurology team. Primary contact: Sloan called But no answer, then called brother Zollie Beckers and updated, and is aware about his condition.He has no wife, but has a daughter Carlena Sax- reports she was in hospital yesterday.  Status is: Inpatient Remains inpatient appropriate because:Unsafe d/c plan, IV treatments appropriate due to intensity of illness or inability to take PO and Inpatient level of care appropriate due to severity of illness  Dispo: The patient is from: Home              Anticipated d/c is to: CIR              Anticipated d/c date is:1-2 day              Patient currently is not medically stable to d/c. Diet Order            Diet NPO time specified  Diet effective now                 Body mass index is 30.04 kg/m.  Consultants:see note  Procedures:see note Microbiology:see note Blood Culture No results found for: SDES, SPECREQUEST, CULT, REPTSTATUS  Other culture-see note  Medications: Scheduled Meds: .  stroke: mapping our early stages  of recovery book   Does not apply Once  . chlorhexidine  15 mL Mouth Rinse BID  . Chlorhexidine Gluconate Cloth  6 each Topical Daily  . feeding supplement (PROSource TF)  45 mL Per Tube BID  . folic acid  1 mg Per Tube Daily  . insulin aspart  0-15 Units Subcutaneous Q4H  . lisinopril  5 mg Per Tube Daily  . mouth rinse  15 mL Mouth Rinse q12n4p  . metoprolol tartrate  25 mg Per Tube BID  . multivitamin with minerals  1 tablet Per Tube Daily  . pantoprazole sodium  40  mg Per Tube Daily  . senna-docusate  1 tablet Per Tube BID  . thiamine  100 mg Per Tube Daily   Continuous Infusions: . feeding supplement (OSMOLITE 1.2 CAL) 1,000 mL (09/09/20 1137)    Antimicrobials: Anti-infectives (From admission, onward)   None     Objective: Vitals: Today's Vitals   09/09/20 0744 09/09/20 0800 09/09/20 1138 09/09/20 1200  BP: 108/88 108/88 120/81 120/81  Pulse: 76 76 92 92  Resp: 18  18   Temp: 98.3 F (36.8 C)  98.4 F (36.9 C)   TempSrc: Oral  Oral   SpO2: 98%  96%   Weight:      PainSc:  0-No pain      Intake/Output Summary (Last 24 hours) at 09/09/2020 1325 Last data filed at 09/09/2020 0630 Gross per 24 hour  Intake 630 ml  Output --  Net 630 ml   Filed Weights   09/06/20 2300  Weight: 87 kg   Weight change:   Intake/Output from previous day: 09/15 0701 - 09/16 0700 In: 630 [NG/GT:630] Out: -  Intake/Output this shift: No intake/output data recorded. Examination:  General exam: AAOx2-3,on ra, core track present NAD, weak appearing. HEENT:Oral mucosa moist, Ear/Nose WNL grossly, dentition normal. Respiratory system: bilaterally clear,no wheezing or crackles,no use of accessory muscle Cardiovascular system: S1 & S2 +, No JVD,. Gastrointestinal system: Abdomen soft, NT,ND, BS+ Nervous System:Alert, awake, moving all his extremities 4/5-but difficult to do complete neuro exam given his lack of cooperation  Extremities: No edema, distal peripheral pulses palpable.  Skin: No rashes,no icterus. MSK: Normal muscle bulk,tone, power Foley+  Data Reviewed: I have personally reviewed following labs and imaging studies CBC: Recent Labs  Lab 09/06/20 1107 09/06/20 1143 09/07/20 0849  WBC 8.2  --  9.2  NEUTROABS 5.3  --   --   HGB 14.9 16.3 15.5  HCT 46.5 48.0 47.7  MCV 90.3  --  89.0  PLT 413*  --  431*   Basic Metabolic Panel: Recent Labs  Lab 09/06/20 1107 09/06/20 1143 09/07/20 0849  NA 136 136  --   K 4.9 3.8  --   CL  99 101  --   CO2 25  --   --   GLUCOSE 188* 162*  --   BUN 7 7  --   CREATININE 0.97 0.80  --   CALCIUM 9.2  --   --   MG  --   --  2.2  PHOS  --   --  4.2   GFR: CrCl cannot be calculated (Unknown ideal weight.). Liver Function Tests: Recent Labs  Lab 09/06/20 1107  AST 61*  ALT 45*  ALKPHOS 73  BILITOT 1.1  PROT 7.1  ALBUMIN 4.2   No results for input(s): LIPASE, AMYLASE in the last 168 hours. No results for input(s): AMMONIA in the last 168 hours. Coagulation Profile:  Recent Labs  Lab 09/06/20 1211  INR 0.9   Cardiac Enzymes: No results for input(s): CKTOTAL, CKMB, CKMBINDEX, TROPONINI in the last 168 hours. BNP (last 3 results) No results for input(s): PROBNP in the last 8760 hours. HbA1C: Recent Labs    09/08/20 0623  HGBA1C 5.9*   CBG: Recent Labs  Lab 09/08/20 1944 09/09/20 0108 09/09/20 0327 09/09/20 0745 09/09/20 1139  GLUCAP 88 131* 111* 121* 140*   Lipid Profile: Recent Labs    09/08/20 0623  CHOL 189  HDL 58  LDLCALC 100*  TRIG 156*  150*  CHOLHDL 3.3   Thyroid Function Tests: No results for input(s): TSH, T4TOTAL, FREET4, T3FREE, THYROIDAB in the last 72 hours. Anemia Panel: No results for input(s): VITAMINB12, FOLATE, FERRITIN, TIBC, IRON, RETICCTPCT in the last 72 hours. Sepsis Labs: No results for input(s): PROCALCITON, LATICACIDVEN in the last 168 hours.  Recent Results (from the past 240 hour(s))  SARS Coronavirus 2 by RT PCR (hospital order, performed in [facility] hospital lab) Nasopharyngeal Nasopharyngeal Swab     Status: None   Collection Time: 09/06/20 11:08 AM   Specimen: Nasopharyngeal Swab  Result Value Ref Range Status   SARS Coronavirus 2 NEGATIVE NEGATIVE Final    Comment: (NOTE) SARS-CoV-2 target nucleic acids are NOT DETECTED.  The SARS-CoV-2 RNA is generally detectable in upper and lower respiratory specimens during the acute phase of infection. The lowest concentration of SARS-CoV-2 viral copies this  assay can detect is 250 copies / mL. A negative result does not preclude SARS-CoV-2 infection and should not be used as the sole basis for treatment or other patient management decisions.  A negative result may occur with improper specimen collection / handling, submission of specimen other than nasopharyngeal swab, presence of viral mutation(s) within the areas targeted by this assay, and inadequate number of viral copies (<250 copies / mL). A negative result must be combined with clinical observations, patient history, and epidemiological information.  Fact Sheet for Patients:   BoilerBrush.com.cy  Fact Sheet for Healthcare Providers: https://pope.com/  This test is not yet approved or  cleared by the [place] FDA and has been authorized for detection and/or diagnosis of SARS-CoV-2 by FDA under an Emergency Use Authorization (EUA).  This EUA will remain in effect (meaning this test can be used) for the duration of the COVID-19 declaration under Section 564(b)(1) of the Act, 21 U.S.C. section 360bbb-3(b)(1), unless the authorization is terminated or revoked sooner.  Performed at [facility], [address], [place], [place] [address]   MRSA PCR Screening     Status: None   Collection Time: 09/09/20  6:00 AM  Result Value Ref Range Status   MRSA by PCR NEGATIVE NEGATIVE Final    Comment:        The GeneXpert MRSA Assay (FDA approved for NASAL specimens only), is one component of a comprehensive MRSA colonization surveillance program. It is not intended to diagnose MRSA infection nor to guide or monitor treatment for MRSA infections. Performed at [facility], [address], [place], [place] [address]      Radiology Studies: No results found.   LOS: 3 days   Lanae Boast, MD [facility]  09/09/2020, 1:25 PM

## 2020-09-09 NOTE — Progress Notes (Signed)
Inpatient Rehab Admissions:  Inpatient Rehab Consult received.  I met with patient at the bedside for rehabilitation assessment and to discuss goals and expectations of an inpatient rehab admission.  Pt seems fairly confused, he is able to state why he is in the hospital with prompting and cueing, but is disoriented and confused with general conversation.  I left a voicemail with his sister, Marnette Burgess, to discuss dispo (per LCSW pt will have 24/7 support after CIR).  I will not have a bed for this patient on CIR until sometime next week.  I will continue to follow.   Signed: Shann Medal, PT, DPT Admissions Coordinator [phone] 09/09/20  4:03 PM

## 2020-09-09 NOTE — TOC Progression Note (Signed)
Transition of Care [facility]) - Progression Note    Patient Details  Name: Zachary Flores MRN: [id number] Date of Birth: Aug 08, 1966  [facility]) CM/SW Contact  Baldemar [place], [place] Phone Number: 09/09/2020, 3:39 PM  Clinical Narrative:   CSW spoke with patient's sister, [name], to answer her questions. [name] asked about Medicaid as patient doesn't have any insurance, and CSW explained financial counseling process. [name] also asked about placement as patient can't go back home this way, and CSW discussed current recommendation for CIR. CSW asked about support at discharge, and [name] indicated that the family will come together. Patient lives with a brother, and there are two other brothers in addition to [place]. [name] appreciative of information, and took CSW information for future questions.    Expected Discharge Plan:  (TBD) Barriers to Discharge: Continued Medical Work up, Requiring sitter/restraints  Expected Discharge Plan and Services Expected Discharge Plan:  (TBD)                                               Social Determinants of Health (SDOH) Interventions    Readmission Risk Interventions No flowsheet data found.

## 2020-09-09 NOTE — Progress Notes (Signed)
Physical Therapy Treatment Patient Details Name: Zachary Flores MRN: [id number] DOB: [birth year] Today's Date: 09/09/2020    History of Present Illness Pt is a 54 yo male presenting with L sided numbness/weakness and dysarthria. CT revealed a pontine hemorrhage. PMH includes: polysubstance abuse (alcohol, cocaine), TIA, CVA, HTN    PT Comments    Patient met with eyes shut, but verbalizing and agrees to PT session. Patient has B mitts and arm restraints donned. He is able to initiate movement to go from supine to sit, however once gets sitting he is pushing into extension of trunk and legs. Unable to achieve sitting on side of the be without max +2 assist. He is unsafe to attempt any transfer at this time due to poor sitting  Balance. He will continue to benefit from skilled PT while here to improve balance, strengthening, and functional independence.       Follow Up Recommendations  CIR     Equipment Recommendations  Other (comment) (TBD)    Recommendations for Other Services Rehab consult     Precautions / Restrictions Precautions Precautions: Fall Precaution Comments: pushes; keep SBP 120-140 Restrictions Weight Bearing Restrictions: No    Mobility  Bed Mobility Overal bed mobility: Needs Assistance Bed Mobility: Supine to Sit;Sit to Supine;Rolling Rolling: Max assist;+2 for physical assistance   Supine to sit: Max assist;+2 for physical assistance Sit to supine: Max assist;+2 for physical assistance   General bed mobility comments: Patient able to initiate movement, eager, but pushing against therapist when sitting. Unable to sit on edge of bed independently.  Transfers                 General transfer comment: NT, unsafe with sitting. Was attempting to stand, but unsafe with this. Demonstrating extension of legs and trunk with attempts to sit at edge of bed.  Ambulation/Gait             General Gait Details: unable   Stairs              Wheelchair Mobility    Modified Rankin (Stroke Patients Only) Modified Rankin (Stroke Patients Only) Pre-Morbid Rankin Score: No significant disability Modified Rankin: Severe disability     Balance Overall balance assessment: Needs assistance Sitting-balance support: Bilateral upper extremity supported;Feet supported Sitting balance-Leahy Scale: Zero Sitting balance - Comments: Patient pushes when in sitting with right hand held assist. Goes into an extension posture with attemptuing to sit. Requires max +2 for bed mobility/safety. Postural control: Posterior lean                                  Cognition Arousal/Alertness: Lethargic Behavior During Therapy: Impulsive;Restless Overall Cognitive Status: Impaired/Different from baseline Area of Impairment: Orientation;Attention;Following commands;Problem solving;Safety/judgement                 Orientation Level: Disoriented to;Time;Place;Situation Current Attention Level: Focused   Following Commands: Follows one step commands with increased time;Follows one step commands inconsistently Safety/Judgement: Decreased awareness of deficits;Decreased awareness of safety   Problem Solving: Slow processing;Decreased initiation;Difficulty sequencing;Requires verbal cues;Requires tactile cues General Comments: Patient difficult to understand at times with garbled speech, at times clear. Unaware of deficts.      Exercises      General Comments        Pertinent Vitals/Pain Pain Assessment: No/denies pain    Home Living  Prior Function            PT Goals (current goals can now be found in the care plan section) Acute Rehab PT Goals Patient Stated Goal: Agreeable to mobilize PT Goal Formulation: Patient unable to participate in goal setting Time For Goal Achievement: 09/22/20 Progress towards PT goals: Not progressing toward goals - comment (continues to require +2 max  assist)    Frequency    Min 4X/week      PT Plan Current plan remains appropriate    Co-evaluation              AM-PAC PT "6 Clicks" Mobility   Outcome Measure  Help needed turning from your back to your side while in a flat bed without using bedrails?: A Lot Help needed moving from lying on your back to sitting on the side of a flat bed without using bedrails?: A Lot Help needed moving to and from a bed to a chair (including a wheelchair)?: Total Help needed standing up from a chair using your arms (e.g., wheelchair or bedside chair)?: Total Help needed to walk in hospital room?: Total Help needed climbing 3-5 steps with a railing? : Total 6 Click Score: 8    End of Session   Activity Tolerance: Other (comment) (patient limited by pushing/decreased safety awareness) Patient left: in bed;with restraints reapplied;with bed alarm set Nurse Communication: Mobility status PT Visit Diagnosis: Other abnormalities of gait and mobility (R26.89);Other symptoms and signs involving the nervous system (R29.898);Hemiplegia and hemiparesis Hemiplegia - Right/Left: Left Hemiplegia - dominant/non-dominant: Non-dominant Hemiplegia - caused by: Nontraumatic intracerebral hemorrhage     Time: 1015-1037 PT Time Calculation (min) (ACUTE ONLY): 22 min  Charges:  $Therapeutic Activity: 8-22 mins                     Cashay Manganelli, PT, GCS 09/09/20,11:49 AM

## 2020-09-09 NOTE — Progress Notes (Addendum)
STROKE TEAM PROGRESS NOTE   INTERVAL HISTORY Patient is lying in bed.  He states he wants to eat and go home.  Continues to have dysarthria and ophthalmoplegia. Therapist recommend inpatient rehab and await rehab evaluation.  Vital signs stable.  No neurological changes. Vitals:   09/09/20 0744 09/09/20 0800 09/09/20 1138 09/09/20 1200  BP: 108/88 108/88 120/81 120/81  Pulse: 76 76 92 92  Resp: 18  18   Temp: 98.3 F (36.8 C)  98.4 F (36.9 C)   TempSrc: Oral  Oral   SpO2: 98%  96%   Weight:       CBC:  Recent Labs  Lab 09/06/20 1107 09/06/20 1107 09/06/20 1143 09/07/20 0849  WBC 8.2  --   --  9.2  NEUTROABS 5.3  --   --   --   HGB 14.9   < > 16.3 15.5  HCT 46.5   < > 48.0 47.7  MCV 90.3  --   --  89.0  PLT 413*  --   --  431*   < > = values in this interval not displayed.   Basic Metabolic Panel:  Recent Labs  Lab 09/06/20 1107 09/06/20 1143 09/07/20 0849  NA 136 136  --   K 4.9 3.8  --   CL 99 101  --   CO2 25  --   --   GLUCOSE 188* 162*  --   BUN 7 7  --   CREATININE 0.97 0.80  --   CALCIUM 9.2  --   --   MG  --   --  2.2  PHOS  --   --  4.2   Lipid Panel:  Recent Labs  Lab 09/08/20 0623  CHOL 189  TRIG 156*  150*  HDL 58  CHOLHDL 3.3  VLDL 31  LDLCALC 650*   HgbA1c:  Recent Labs  Lab 09/08/20 0623  HGBA1C 5.9*   Urine Drug Screen:  Recent Labs  Lab 09/06/20 1441  LABOPIA NONE DETECTED  COCAINSCRNUR POSITIVE*  LABBENZ NONE DETECTED  AMPHETMU NONE DETECTED  THCU NONE DETECTED  LABBARB NONE DETECTED    Alcohol Level  Recent Labs  Lab 09/06/20 1107  ETH <10    IMAGING past 24 hours No results found.  PHYSICAL EXAM    Middle-aged African-American male not in distress. . Afebrile. Head is nontraumatic. Neck is supple without bruit.    Cardiac exam no murmur or gallop. Lungs are clear to auscultation. Distal pulses are well felt. Neurological Exam :  Patient is awake and interactive.  Is oriented to place and person.  He has  severe dysarthria but can be understood.  He has complete ophthalmoplegia with skew eye deviation with right eye showing exotropia and hypertropia.  He has complete horizontal ophthalmoplegia with only trace vertical eye movements.  Pupils equal reactive.  Corneal reflex is preserved.  Fundi not visualized.  Mild bifacial weakness.  Tongue midline motor system exam is antigravity at least 4/5 strength in all 4 extremities though difficult to check fine motor skills due to lack of cooperation.  Subjective diminished sensation in the left body compared to the right.  Coordination slightly impaired on the left compared to the right.  Deep tendon reflex symmetric.  Plantars downgoing.  Gait not tested.   ASSESSMENT/PLAN Zachary Flores is a 54 y.o. male with history of HTN presenting with L sided weakness, double vision.   Stroke:  Central pontine hemorrhage in setting of HTN and cocaine  use  CTA head & neck 2.77mL pontine hemorrhage. Severe small vessel disease. Mild atherosclerosis head & neck. emphysema  CT head stable pontine hemorrhage. Extensive small vessel disease.   2D Echo EF 55-60%. No source of embolus   LDL 100  HgbA1c 5.9  VTE prophylaxis - change to lovneox, d/c scd   No antithrombotic prior to admission  Therapy recommendations:  CIR  Disposition:  pending   Transfer to floor   Transition to medical Hospitalists service today  Hypertensive Emergency  BP as high as 198/99 day of admission   Home meds:  Lisinopril 5, metoprolol 25 bid - has not taken in a while b/c he was out  Treated w/ Cleviprex drip, now off  SBP goal < 160  Resume PO meds as now has oral access . Long-term BP goal normotensive  Hyperlipidemia  Home meds:  None   LDL pending   Avoid statin in setting of acute hemorrhage  Dysphagia . Secondary to stroke . NPO . Cortrak placed today . Start TF . Speech on board   Other Stroke Risk Factors  Cigarette smoker, advised to stop  smoking  ETOH use, alcohol level <10, advised to drink no more than 2 drink(s) a day. On CIWA protocol.  Substance abuse - UDS:  Cocaine POSITIVE. Patient advised to stop using due to stroke risk.  Obesity, Body mass index is 30.04 kg/m., recommend weight loss, diet and exercise as appropriate   Other Active Problems  Thrombocytosis, PLT [phone]->[id number]  Hospital day # 3  Continue core track tube feeding for dysphagia and ongoing therapies.  Transfer to inpatient rehab over the next few days when bed available and after insurance approval.  Transfer to medical hospitalist service as primary.  Discussed with Dr. Dayna Barker. Delia Heady, MD   To contact Stroke Continuity provider, please refer to [email or web address]. After hours, contact General Neurology

## 2020-09-10 ENCOUNTER — Inpatient Hospital Stay (HOSPITAL_COMMUNITY): Payer: Medicaid Other

## 2020-09-10 DIAGNOSIS — G9341 Metabolic encephalopathy: Secondary | ICD-10-CM | POA: Diagnosis present

## 2020-09-10 DIAGNOSIS — F1491 Cocaine use, unspecified, in remission: Secondary | ICD-10-CM | POA: Diagnosis present

## 2020-09-10 DIAGNOSIS — Z87898 Personal history of other specified conditions: Secondary | ICD-10-CM | POA: Diagnosis present

## 2020-09-10 DIAGNOSIS — R471 Dysarthria and anarthria: Secondary | ICD-10-CM | POA: Clinically undetermined

## 2020-09-10 LAB — GLUCOSE, CAPILLARY
Glucose-Capillary: 150 mg/dL — ABNORMAL HIGH (ref 70–99)
Glucose-Capillary: 114 mg/dL — ABNORMAL HIGH (ref 70–99)
Glucose-Capillary: 114 mg/dL — ABNORMAL HIGH (ref 70–99)
Glucose-Capillary: 96 mg/dL (ref 70–99)
Glucose-Capillary: 98 mg/dL (ref 70–99)

## 2020-09-10 MED ORDER — ACETAMINOPHEN 650 MG RE SUPP: 650.0000 mg | RECTAL | Status: DC | PRN

## 2020-09-10 MED ORDER — FOLIC ACID 1 MG PO TABS
1.0000 mg | ORAL_TABLET | Freq: Every day | ORAL | Status: DC
  Administered 2020-09-11 – 2020-09-15 (×5): 1 mg via ORAL
  Filled 2020-09-10 (×6): qty 1

## 2020-09-10 MED ORDER — SENNOSIDES-DOCUSATE SODIUM 8.6-50 MG PO TABS
1.0000 | ORAL_TABLET | Freq: Two times a day (BID) | ORAL | Status: DC
  Administered 2020-09-10 – 2020-09-15 (×10): 1 via ORAL
  Filled 2020-09-10 (×10): qty 1

## 2020-09-10 MED ORDER — ACETAMINOPHEN 325 MG PO TABS
650.0000 mg | ORAL_TABLET | ORAL | Status: DC | PRN
  Administered 2020-09-11 – 2020-09-13 (×3): 650 mg via ORAL
  Filled 2020-09-10 (×3): qty 2

## 2020-09-10 MED ORDER — PANTOPRAZOLE SODIUM 40 MG PO TBEC
40.0000 mg | DELAYED_RELEASE_TABLET | Freq: Every day | ORAL | Status: DC
  Administered 2020-09-11 – 2020-09-15 (×5): 40 mg via ORAL
  Filled 2020-09-10 (×5): qty 1

## 2020-09-10 MED ORDER — LISINOPRIL 5 MG PO TABS
5.0000 mg | ORAL_TABLET | Freq: Every day | ORAL | Status: DC
  Administered 2020-09-11 – 2020-09-15 (×5): 5 mg via ORAL
  Filled 2020-09-10 (×6): qty 1

## 2020-09-10 MED ORDER — PANTOPRAZOLE SODIUM 40 MG PO TBEC: 40.0000 mg | DELAYED_RELEASE_TABLET | Freq: Every day | ORAL | Status: DC

## 2020-09-10 MED ORDER — ADULT MULTIVITAMIN W/MINERALS CH
1.0000 | ORAL_TABLET | Freq: Every day | ORAL | Status: DC
  Administered 2020-09-11 – 2020-09-15 (×5): 1 via ORAL
  Filled 2020-09-10 (×5): qty 1

## 2020-09-10 MED ORDER — QUETIAPINE FUMARATE 25 MG PO TABS: 25.0000 mg | ORAL_TABLET | Freq: Every day | ORAL | Status: DC

## 2020-09-10 MED ORDER — THIAMINE HCL 100 MG PO TABS
100.0000 mg | ORAL_TABLET | Freq: Every day | ORAL | Status: DC
  Administered 2020-09-11 – 2020-09-15 (×5): 100 mg via ORAL
  Filled 2020-09-10 (×5): qty 1

## 2020-09-10 MED ORDER — METOPROLOL TARTRATE 25 MG PO TABS
25.0000 mg | ORAL_TABLET | Freq: Two times a day (BID) | ORAL | Status: DC
  Administered 2020-09-10 – 2020-09-15 (×10): 25 mg via ORAL
  Filled 2020-09-10 (×10): qty 1

## 2020-09-10 MED ORDER — QUETIAPINE FUMARATE 25 MG PO TABS
25.0000 mg | ORAL_TABLET | Freq: Every day | ORAL | Status: DC
  Administered 2020-09-10 – 2020-09-14 (×5): 25 mg via ORAL
  Filled 2020-09-10 (×5): qty 1

## 2020-09-10 NOTE — Consult Note (Signed)
Patient seen and chart reviewed by this nurse practitioner.  Psych consult placed for agitation.  Patient was observed to be lying in bed with soft wrist restraints in place, and mittens in place.  He is alert and oriented x3.  He states his reason for agitation is because he has the restraints in place.  He is able to verbalize why it frustrates him, and is a normal reaction when he cannot move because he is tied down.  Writer reiterated to patient the restraints are in place for his safety, as well as safety of staff.  He verbalizes understanding.  Writer also discussed with patient his current anxiety and agitation, will not allow his brain to rest and heal from the bleeding.  He denies his head in agreement and says he will try to do better.  His staff nurse is in the room with him, and we did agree to a trial of removing the restraints once patient is less combative, and more oriented.  He does understand that if the restraints are removed when he becomes aggressive and agitated they will be placed again.  Patient was initiated on Seroquel 12.5 mg p.o. nightly.  We will increase this to 25 mg to help with ongoing agitation.  He is open to substance abuse treatment and therapy, will recommend working closely with social work to facilitate this once patient is medically clear.  We will psychiatrically cleared patient at this time.

## 2020-09-10 NOTE — Progress Notes (Signed)
STROKE TEAM PROGRESS NOTE   INTERVAL HISTORY Patient is lying in bed.  He states he wants to eat and go home.  Continues to have dysarthria and ophthalmoplegia. Therapist recommend inpatient rehab and await rehab evaluation.  Vital signs stable.  Neurological exam is unchanged. Vitals:   09/10/20 1142 09/10/20 1200 09/10/20 1544 09/10/20 1631  BP: 111/87 111/87 (!) 137/101 117/81  Pulse: 83 83 88 82  Resp: 18  18   Temp: 98.4 F (36.9 C)  97.8 F (36.6 C)   TempSrc: Oral  Oral   SpO2: 100%     Weight:       CBC:  Recent Labs  Lab 09/06/20 1107 09/06/20 1107 09/06/20 1143 09/07/20 0849  WBC 8.2  --   --  9.2  NEUTROABS 5.3  --   --   --   HGB 14.9   < > 16.3 15.5  HCT 46.5   < > 48.0 47.7  MCV 90.3  --   --  89.0  PLT 413*  --   --  431*   < > = values in this interval not displayed.   Basic Metabolic Panel:  Recent Labs  Lab 09/06/20 1107 09/06/20 1143 09/07/20 0849  NA 136 136  --   K 4.9 3.8  --   CL 99 101  --   CO2 25  --   --   GLUCOSE 188* 162*  --   BUN 7 7  --   CREATININE 0.97 0.80  --   CALCIUM 9.2  --   --   MG  --   --  2.2  PHOS  --   --  4.2   Lipid Panel:  Recent Labs  Lab 09/08/20 0623  CHOL 189  TRIG 156*   150*  HDL 58  CHOLHDL 3.3  VLDL 31  LDLCALC 644*   HgbA1c:  Recent Labs  Lab 09/08/20 0623  HGBA1C 5.9*   Urine Drug Screen:  Recent Labs  Lab 09/06/20 1441  LABOPIA NONE DETECTED  COCAINSCRNUR POSITIVE*  LABBENZ NONE DETECTED  AMPHETMU NONE DETECTED  THCU NONE DETECTED  LABBARB NONE DETECTED    Alcohol Level  Recent Labs  Lab 09/06/20 1107  ETH <10    IMAGING past 24 hours DG Swallowing Func-Speech Pathology  Result Date: 09/10/2020 Objective Swallowing Evaluation: Type of Study: MBS-Modified Barium Swallow Study  Patient Details Name: Zachary Flores MRN: [id number] Date of Birth: 07/16/1966 Today's Date: 09/10/2020 Time: SLP Start Time (ACUTE ONLY): 1150 -SLP Stop Time (ACUTE ONLY): 1206 SLP Time Calculation  (min) (ACUTE ONLY): 16 min Past Medical History: No past medical history on file. Past Surgical History: No past surgical history on file. HPI: Pt is a 54 yo male presenting with L sided numbness/weakness and dysarthria. CT revealed a pontine hemorrhage. PMH includes: polysubstance abuse (alcohol, cocaine), TIA, CVA, HTN  Subjective: pleasant, bilateral mitts on arms, follows all directions, not consistently verbal Assessment / Plan / Recommendation CHL IP CLINICAL IMPRESSIONS 09/10/2020 Clinical Impression Patient presents with a primary oral dysphagia leading to premature spillage of puree solids and thin liquids boluses and with initiation of swallow starting at level of vallecular sinus. Patient did not exhibit any aspiration or penetration with any boluses, even with large volume straw sips of thin liquids, and only trace to mild pharyngeal residuals that appeared to be around cortrak tube in pharynx. No retrograde movement of boluses noted. Patient exhibite delayed mastication, weak lingual maniuplation and bolus transit in oral cavity  from anterior to posterior position. SLP Visit Diagnosis Dysphagia, oropharyngeal phase (R13.12) Attention and concentration deficit following -- Frontal lobe and executive function deficit following -- Impact on safety and function Mild aspiration risk   CHL IP TREATMENT RECOMMENDATION 09/10/2020 Treatment Recommendations Therapy as outlined in treatment plan below   Prognosis 09/10/2020 Prognosis for Safe Diet Advancement Good Barriers to Reach Goals -- Barriers/Prognosis Comment -- CHL IP DIET RECOMMENDATION 09/10/2020 SLP Diet Recommendations Thin liquid;Dysphagia 2 (Fine chop) solids Liquid Administration via Cup;Straw Medication Administration Crushed with puree Compensations Minimize environmental distractions;Slow rate;Small sips/bites Postural Changes --   CHL IP OTHER RECOMMENDATIONS 09/10/2020 Recommended Consults -- Oral Care Recommendations Oral care BID Other  Recommendations --   CHL IP FOLLOW UP RECOMMENDATIONS 09/10/2020 Follow up Recommendations Inpatient Rehab   CHL IP FREQUENCY AND DURATION 09/10/2020 Speech Therapy Frequency (ACUTE ONLY) min 2x/week Treatment Duration 2 weeks      CHL IP ORAL PHASE 09/10/2020 Oral Phase Impaired Oral - Pudding Teaspoon -- Oral - Pudding Cup -- Oral - Honey Teaspoon -- Oral - Honey Cup -- Oral - Nectar Teaspoon -- Oral - Nectar Cup -- Oral - Nectar Straw -- Oral - Thin Teaspoon -- Oral - Thin Cup Reduced posterior propulsion;Premature spillage Oral - Thin Straw Premature spillage Oral - Puree Weak lingual manipulation;Reduced posterior propulsion;Delayed oral transit;Premature spillage Oral - Mech Soft Impaired mastication;Weak lingual manipulation;Reduced posterior propulsion;Delayed oral transit Oral - Regular -- Oral - Multi-Consistency -- Oral - Pill Decreased bolus cohesion;Other (Comment) Oral Phase - Comment --  CHL IP PHARYNGEAL PHASE 09/10/2020 Pharyngeal Phase Impaired Pharyngeal- Pudding Teaspoon -- Pharyngeal -- Pharyngeal- Pudding Cup -- Pharyngeal -- Pharyngeal- Honey Teaspoon -- Pharyngeal -- Pharyngeal- Honey Cup -- Pharyngeal -- Pharyngeal- Nectar Teaspoon -- Pharyngeal -- Pharyngeal- Nectar Cup -- Pharyngeal -- Pharyngeal- Nectar Straw -- Pharyngeal -- Pharyngeal- Thin Teaspoon -- Pharyngeal -- Pharyngeal- Thin Cup Delayed swallow initiation-vallecula Pharyngeal -- Pharyngeal- Thin Straw Delayed swallow initiation-vallecula;Delayed swallow initiation-pyriform sinuses Pharyngeal -- Pharyngeal- Puree Delayed swallow initiation-vallecula Pharyngeal -- Pharyngeal- Mechanical Soft WFL Pharyngeal -- Pharyngeal- Regular -- Pharyngeal -- Pharyngeal- Multi-consistency -- Pharyngeal -- Pharyngeal- Pill -- Pharyngeal -- Pharyngeal Comment --  CHL IP CERVICAL ESOPHAGEAL PHASE 09/10/2020 Cervical Esophageal Phase WFL Pudding Teaspoon -- Pudding Cup -- Honey Teaspoon -- Honey Cup -- Nectar Teaspoon -- Nectar Cup -- Nectar Straw --  Thin Teaspoon -- Thin Cup -- Thin Straw -- Puree -- Mechanical Soft -- Regular -- Multi-consistency -- Pill -- Cervical Esophageal Comment -- Angela Nevin, MA, CCC-SLP Speech Therapy [facility] Acute Rehab              PHYSICAL EXAM    Middle-aged African-American male not in distress. . Afebrile. Head is nontraumatic. Neck is supple without bruit.    Cardiac exam no murmur or gallop. Lungs are clear to auscultation. Distal pulses are well felt. Neurological Exam :  Patient is awake and interactive.  Is oriented to place and person.  He has severe dysarthria but can be understood.  He has complete ophthalmoplegia with skew eye deviation with right eye showing exotropia and hypertropia.  He has complete horizontal ophthalmoplegia with only trace vertical eye movements.  Pupils equal reactive.  Corneal reflex is preserved.  Fundi not visualized.  Mild bifacial weakness.  Tongue midline motor system exam is antigravity at least 4/5 strength in all 4 extremities though difficult to check fine motor skills due to lack of cooperation.  Subjective diminished sensation in the left body compared to the right.  Coordination slightly impaired on the left compared to the right.  Deep tendon reflex symmetric.  Plantars downgoing.  Gait not tested.   ASSESSMENT/PLAN Zachary Flores is a 54 y.o. male with history of HTN presenting with L sided weakness, double vision.   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 Severe small vessel disease. Mild atherosclerosis head & neck. emphysema  CT head stable pontine hemorrhage. Extensive small vessel disease.   2D Echo EF 55-60%. No source of embolus   LDL 100  HgbA1c 5.9  VTE prophylaxis - change to lovneox, d/c scd   No antithrombotic prior to admission  Therapy recommendations:  CIR  Disposition:  pending   Transfer to floor   Transition to medical Hospitalists service  today  Hypertensive Emergency  BP as high as 198/99 day of admission   Home meds:  Lisinopril 5, metoprolol 25 bid - has not taken in a while b/c he was out  Treated w/ Cleviprex drip, now off  SBP goal < 160  Resume PO meds as now has oral access  Long-term BP goal normotensive  Hyperlipidemia  Home meds:  None   LDL pending   Avoid statin in setting of acute hemorrhage  Dysphagia  Secondary to stroke  NPO  Cortrak placed today  Start TF  Speech on board   Other Stroke Risk Factors  Cigarette smoker, advised to stop smoking  ETOH use, alcohol level <10, advised to drink no more than 2 drink(s) a day. On CIWA protocol.  Substance abuse - UDS:  Cocaine POSITIVE. Patient advised to stop using due to stroke risk.  Obesity, Body mass index is 27.17 kg/m., recommend weight loss, diet and exercise as appropriate   Other Active Problems  Thrombocytosis, PLT [id number]  Hospital day # 4  Continue core track tube feeding for dysphagia and ongoing therapies.  Transfer to inpatient rehab over the next few days when bed available and after insurance approval.  Stroke team will sign off.  Kindly call for questions..  Discussed with Dr. Rito Ehrlich. Delia Heady, MD   To contact Stroke Continuity provider, please refer to [email or web address]. After hours, contact General Neurology

## 2020-09-10 NOTE — Progress Notes (Addendum)
  Speech Language Pathology Treatment: Dysphagia  Patient Details Name: Zachary Flores MRN: [id number] DOB: March 20, 1966 Today's Date: 09/10/2020 Time: 1700-1749 SLP Time Calculation (min) (ACUTE ONLY): 16 min  Assessment / Plan / Recommendation Clinical Impression  Zachary Flores with improved attention to bolus trials today. With both thin and nectar thick liquids by straw there was delayed throat clear noted.  Zachary Flores tolerated puree and solids without overt s/s of aspiration; however, there was R pocketing noted even with modified soft solids and simulated ground consistencies.  Given clinical presentation and location of hemorrhage, recommend instrumental swallow evaluation prior to initiation of PO diet.  Zachary Flores has Cortrak in place for nutritional support pending results of study.   Zachary Flores is extremely dysarthric and mostly unintelligible, but was noted to say "That's pretty good" in response to bolus trials.  Zachary Flores benefited from tactile cues to follow directions related to PO trials during today's session.   HPI HPI: Zachary Flores is a 54 yo male presenting with L sided numbness/weakness and dysarthria. CT revealed a pontine hemorrhage. PMH includes: polysubstance abuse (alcohol, cocaine), TIA, CVA, HTN      SLP Plan  Continue with current plan of care;MBS       Recommendations  Diet recommendations: NPO Medication Administration: Via alternative means                Oral Care Recommendations: Oral care QID Follow up Recommendations: Inpatient Rehab SLP Visit Diagnosis: Dysphagia, unspecified (R13.10) Plan: Continue with current plan of care;MBS       GO                Kerrie Pleasure, MA, CCC-SLP Acute Rehabilitation Services Office: [phone]  09/10/2020, 12:11 PM

## 2020-09-10 NOTE — Progress Notes (Signed)
PROGRESS NOTE    Zachary Flores  ZHY:[id number] DOB: [birth year] DOA: 09/06/2020 PCP: Zachary Flores, No Pcp Per   Chief Complaint  Zachary Flores presents with  . Stroke Symptoms   Brief Narrative: As per hpi:54 year old with hypertension supposed to be on lisinopril 5 mg metoprolol 25 twice daily , cocaine abuse presented with presented to the ED on 09/06/2020 w/ double vision starting that day and found to have left-sided weakness.  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 Zachary Flores had been agitated tachypneic diaphoretic needing Precedex drip CIWA Ativan and on intermittent antihypertensive drip.  Following weaning off of drip, transferred to hospitalist service on 9/16.  Currently being considered for inpatient rehab.  Zachary Flores somewhat alert, rambles.  Remains dysarthric.  Assessment & Plan:  Acute [place] Pontine hemorrhage in the setting of hypertension and cocaine use: Zachary Flores underwent further work-up as per the stroke protocol with neuro ICU with CT head and neck, repeat CT head with a stable pontine hemorrhage, extensive small vessel disease, 2D echo EF 55 to 60%, no source of embolus, LDL 100, hemoglobin A1c 5.9.  Blood pressure controlled on metoprolol and lisinopril.  Core track tube in place speech following.He has been transferred to [facility] service today from neurology and will need inpatient rehab.    Delirium/acute toxic metabolic encephalopathy/alcohol withdrawal syndrome with complication: Initially on Precedex.  Received Haldol  Last night.started on low-dose Seroquel 9/16-which should be temporary and need to be weaned off/discontinued once his mental status improves.  Being seen by speech therapy.  For modified barium  Cocaine positive on UDS will need extensive counseling once no more confused  Urine retention: Zachary Flores had a coud catheter which is in place.  Add Flomax  DVT prophylaxis: SCD's Start: 09/06/20 1324 no anticoagulation due  to pontine hemorrhage Code Status:   Code Status: Full Code  Family Communication: Left message for brother  Status is: Inpatient Remains inpatient appropriate because:Unsafe d/c plan, IV treatments appropriate due to intensity of illness or inability to take PO and Inpatient level of care appropriate due to severity of illness  Dispo: The Zachary Flores is from: Home              Anticipated d/c is to: CIR              Anticipated d/c date is: Once bed available, hopefully next week              Zachary Flores currently is not medically stable to d/c. Diet Order            Diet NPO time specified  Diet effective now                 Body mass index is 27.17 kg/m.  Consultants:see note  Procedures:see note Microbiology:see note Blood Culture No results found for: SDES, SPECREQUEST, CULT, REPTSTATUS  Other culture-see note  Medications: Scheduled Meds: . chlorhexidine  15 mL Mouth Rinse BID  . Chlorhexidine Gluconate Cloth  6 each Topical Daily  . feeding supplement (PROSource TF)  45 mL Per Tube BID  . folic acid  1 mg Per Tube Daily  . insulin aspart  0-15 Units Subcutaneous Q4H  . lisinopril  5 mg Per Tube Daily  . mouth rinse  15 mL Mouth Rinse q12n4p  . metoprolol tartrate  25 mg Per Tube BID  . multivitamin with minerals  1 tablet Per Tube Daily  . pantoprazole sodium  40 mg Per Tube Daily  . QUEtiapine  12.5 mg Per Tube QHS  . senna-docusate  1 tablet Per Tube BID  . thiamine  100 mg Per Tube Daily   Continuous Infusions: . feeding supplement (OSMOLITE 1.2 CAL) 1,000 mL (09/10/20 0419)    Antimicrobials: Anti-infectives (From admission, onward)   None     Objective: Vitals: Today's Vitals   09/10/20 0740 09/10/20 0800 09/10/20 1142 09/10/20 1200  BP: 116/79 116/79 111/87 111/87  Pulse: 90 90 83 83  Resp: 18  18   Temp: 98.7 F (37.1 C)  98.4 F (36.9 C)   TempSrc: Axillary  Oral   SpO2: 100%  100%   Weight:      PainSc:        Intake/Output Summary (Last  24 hours) at 09/10/2020 1235 Last data filed at 09/10/2020 0800 Gross per 24 hour  Intake 1185 ml  Output 800 ml  Net 385 ml   Filed Weights   09/06/20 2300 09/10/20 0420  Weight: 87 kg 78.7 kg   Weight change:   Intake/Output from previous day: 09/16 0701 - 09/17 0700 In: 1185 [NG/GT:1185] Out: 800 [Urine:800] Intake/Output this shift: No intake/output data recorded. Examination:  General exam: Awake, oriented x1-2 HEENT: Normocephalic and atraumatic, mucous membranes slightly dry Respiratory system: Clear to auscultation bilaterally, no labored breathing Cardiovascular system: Regular rate and rhythm, S1-S2 Gastrointestinal system: Soft, nontender, nondistended, positive bowel sounds Nervous System: No overt focal deficits, although exam limited due to Zachary Flores's cooperation Extremities: No edema  Skin: No skin breaks, tears or lesions  Data Reviewed: I have personally reviewed following labs and imaging studies CBC: Recent Labs  Lab 09/06/20 1107 09/06/20 1143 09/07/20 0849  WBC 8.2  --  9.2  NEUTROABS 5.3  --   --   HGB 14.9 16.3 15.5  HCT 46.5 48.0 47.7  MCV 90.3  --  89.0  PLT 413*  --  431*   Basic Metabolic Panel: Recent Labs  Lab 09/06/20 1107 09/06/20 1143 09/07/20 0849  NA 136 136  --   K 4.9 3.8  --   CL 99 101  --   CO2 25  --   --   GLUCOSE 188* 162*  --   BUN 7 7  --   CREATININE 0.97 0.80  --   CALCIUM 9.2  --   --   MG  --   --  2.2  PHOS  --   --  4.2   GFR: CrCl cannot be calculated (Unknown ideal weight.). Liver Function Tests: Recent Labs  Lab 09/06/20 1107  AST 61*  ALT 45*  ALKPHOS 73  BILITOT 1.1  PROT 7.1  ALBUMIN 4.2   No results for input(s): LIPASE, AMYLASE in the last 168 hours. No results for input(s): AMMONIA in the last 168 hours. Coagulation Profile: Recent Labs  Lab 09/06/20 1211  INR 0.9   Cardiac Enzymes: No results for input(s): CKTOTAL, CKMB, CKMBINDEX, TROPONINI in the last 168 hours. BNP (last 3  results) No results for input(s): PROBNP in the last 8760 hours. HbA1C: Recent Labs    09/08/20 0623  HGBA1C 5.9*   CBG: Recent Labs  Lab 09/09/20 1924 09/09/20 2326 09/10/20 0354 09/10/20 0743 09/10/20 1139  GLUCAP 148* 136* 150* 114* 114*   Lipid Profile: Recent Labs    09/08/20 0623  CHOL 189  HDL 58  LDLCALC 100*  TRIG 156*  150*  CHOLHDL 3.3   Thyroid Function Tests: No results for input(s): TSH, T4TOTAL, FREET4, T3FREE, THYROIDAB in the last  72 hours. Anemia Panel: No results for input(s): VITAMINB12, FOLATE, FERRITIN, TIBC, IRON, RETICCTPCT in the last 72 hours. Sepsis Labs: No results for input(s): PROCALCITON, LATICACIDVEN in the last 168 hours.  Recent Results (from the past 240 hour(s))  SARS Coronavirus 2 by RT PCR (hospital order, performed in [facility] hospital lab) Nasopharyngeal Nasopharyngeal Swab     Status: None   Collection Time: 09/06/20 11:08 AM   Specimen: Nasopharyngeal Swab  Result Value Ref Range Status   SARS Coronavirus 2 NEGATIVE NEGATIVE Final    Comment: (NOTE) SARS-CoV-2 target nucleic acids are NOT DETECTED.  The SARS-CoV-2 RNA is generally detectable in upper and lower respiratory specimens during the acute phase of infection. The lowest concentration of SARS-CoV-2 viral copies this assay can detect is 250 copies / mL. A negative result does not preclude SARS-CoV-2 infection and should not be used as the sole basis for treatment or other Zachary Flores management decisions.  A negative result may occur with improper specimen collection / handling, submission of specimen other than nasopharyngeal swab, presence of viral mutation(s) within the areas targeted by this assay, and inadequate number of viral copies (<250 copies / mL). A negative result must be combined with clinical observations, Zachary Flores history, and epidemiological information.  Fact Sheet for Patients:   BoilerBrush.com.cy  Fact Sheet for  Healthcare Providers: https://pope.com/  This test is not yet approved or  cleared by the [place] FDA and has been authorized for detection and/or diagnosis of SARS-CoV-2 by FDA under an Emergency Use Authorization (EUA).  This EUA will remain in effect (meaning this test can be used) for the duration of the COVID-19 declaration under Section 564(b)(1) of the Act, 21 U.S.C. section 360bbb-3(b)(1), unless the authorization is terminated or revoked sooner.  Performed at [facility], [address], [place], [place] [address]   MRSA PCR Screening     Status: None   Collection Time: 09/09/20  6:00 AM  Result Value Ref Range Status   MRSA by PCR NEGATIVE NEGATIVE Final    Comment:        The GeneXpert MRSA Assay (FDA approved for NASAL specimens only), is one component of a comprehensive MRSA colonization surveillance program. It is not intended to diagnose MRSA infection nor to guide or monitor treatment for MRSA infections. Performed at [facility], [address] [place], [place] [address]      Radiology Studies: No results found.   LOS: 4 days   Hollice Espy, MD [facility]  09/10/2020, 12:35 PM

## 2020-09-10 NOTE — Progress Notes (Signed)
Physical Therapy Treatment Patient Details Name: Zachary Flores MRN: [id number] DOB: [birth year] Today's Date: 09/10/2020    History of Present Illness Pt is a 54 yo male presenting with L sided numbness/weakness and dysarthria. CT revealed a pontine hemorrhage. PMH includes: polysubstance abuse (alcohol, cocaine), TIA, CVA, HTN    PT Comments    Patient more alert and oriented today. This session focused on static sitting balance and working to break pushing once upright. Initially requires Max A of 2 due to heavy posterior lean going into hip/LE/trunk extension. Worked on repositioning of LEs into flexion and using UEs/trunk to come anteriorly. Used tray table to assist with forward lean, pushing it forward. Able to take 1 UE support off tray and maintain balance with mild left lateral lean requiring Min A; able to self correct to midline but not able to sustain. Follows commands today. Continues to be appropriate for CIR. Will follow.   Follow Up Recommendations  CIR     Equipment Recommendations  Other (comment) (defer)    Recommendations for Other Services       Precautions / Restrictions Precautions Precautions: Fall Precaution Comments: pushes; keep SBP 120-140, NG tube, mitts Restrictions Weight Bearing Restrictions: No    Mobility  Bed Mobility Overal bed mobility: Needs Assistance Bed Mobility: Rolling;Sidelying to Sit;Sit to Sidelying Rolling: Mod assist Sidelying to sit: Max assist;HOB elevated     Sit to sidelying: Mod assist;+2 for physical assistance General bed mobility comments: Able to initiate moving LEs to EOB wtih assist; assist to use RUE to reach for rail and assist to come into sitting. Heavy pushing posteriorly into extension once EOB needing to block knees and reposition trunk/hips anteriorly.  Transfers                 General transfer comment: Deferred today to work on static sitting balance.  Ambulation/Gait                  Stairs             Wheelchair Mobility    Modified Rankin (Stroke Patients Only) Modified Rankin (Stroke Patients Only) Pre-Morbid Rankin Score: No significant disability Modified Rankin: Moderately severe disability     Balance Overall balance assessment: Needs assistance Sitting-balance support: Feet supported;Bilateral upper extremity supported Sitting balance-Leahy Scale: Zero Sitting balance - Comments: Pt demonstrating pushing posteriorly with increased tone needing assist to block LEs and come into hip flexion. Worked on anterior trunk lean/hip flexion by using rhythmic movements through UEs  progressing to placing UEs (forearms) on tray table and pushing it forward to elicit flexion. Attempted removing 1 UE support to work on [name]; left lateral lean, able to self corret with cues but not sustain. Postural control: Posterior lean;Left lateral lean                                  Cognition Arousal/Alertness: Lethargic;Awake/alert Behavior During Therapy: Impulsive Overall Cognitive Status: Impaired/Different from baseline Area of Impairment: Attention;Following commands;Safety/judgement;Problem solving                   Current Attention Level: Focused;Sustained   Following Commands: Follows one step commands with increased time;Follows one step commands inconsistently Safety/Judgement: Decreased awareness of deficits;Decreased awareness of safety   Problem Solving: Slow processing;Decreased initiation;Difficulty sequencing;Requires verbal cues;Requires tactile cues General Comments: Able to state he was in the hospital in [place] with a stroke. knows it  is September 2021. Dysarthric. Better initiation today.      Exercises      General Comments        Pertinent Vitals/Pain Pain Assessment: Faces Faces Pain Scale: No hurt    Home Living                      Prior Function            PT Goals (current  goals can now be found in the care plan section) Progress towards PT goals: Progressing toward goals    Frequency    Min 4X/week      PT Plan Current plan remains appropriate    Co-evaluation              AM-PAC PT "6 Clicks" Mobility   Outcome Measure  Help needed turning from your back to your side while in a flat bed without using bedrails?: A Lot Help needed moving from lying on your back to sitting on the side of a flat bed without using bedrails?: Total Help needed moving to and from a bed to a chair (including a wheelchair)?: Total Help needed standing up from a chair using your arms (e.g., wheelchair or bedside chair)?: Total Help needed to walk in hospital room?: Total Help needed climbing 3-5 steps with a railing? : Total 6 Click Score: 7    End of Session   Activity Tolerance: Patient tolerated treatment well Patient left: in bed;with call bell/phone within reach;with restraints reapplied;with bed alarm set Nurse Communication: Mobility status PT Visit Diagnosis: Other abnormalities of gait and mobility (R26.89);Other symptoms and signs involving the nervous system (R29.898);Hemiplegia and hemiparesis Hemiplegia - Right/Left: Left Hemiplegia - dominant/non-dominant: Non-dominant Hemiplegia - caused by: Nontraumatic intracerebral hemorrhage     Time: 1610-9604 PT Time Calculation (min) (ACUTE ONLY): 21 min  Charges:  $Neuromuscular Re-education: 8-22 mins                     Vale Haven, PT, DPT Acute Rehabilitation Services Pager [phone] Office [phone]       Blake Divine A Lanier Ensign 09/10/2020, 12:58 PM

## 2020-09-10 NOTE — Progress Notes (Signed)
Pt's restraints were loosened at 1100. Soft wrist restraints were still on patients arm at time of Psych NP visit, but were not tied to the bed on either wrist. Mittens were on. This RN was at the bedside during Psych visit. Pt more cooperative and not combative therefore enabling restraints to be removed 11:00 AM. At this time, the restraints are completely off.

## 2020-09-11 ENCOUNTER — Encounter (HOSPITAL_COMMUNITY): Payer: Self-pay | Admitting: Internal Medicine

## 2020-09-11 LAB — GLUCOSE, CAPILLARY
Glucose-Capillary: 100 mg/dL — ABNORMAL HIGH (ref 70–99)
Glucose-Capillary: 106 mg/dL — ABNORMAL HIGH (ref 70–99)
Glucose-Capillary: 108 mg/dL — ABNORMAL HIGH (ref 70–99)
Glucose-Capillary: 111 mg/dL — ABNORMAL HIGH (ref 70–99)
Glucose-Capillary: 96 mg/dL (ref 70–99)

## 2020-09-11 MED ORDER — PNEUMOCOCCAL VAC POLYVALENT 25 MCG/0.5ML IJ INJ: 0.5000 mL | INJECTION | INTRAMUSCULAR | Status: DC

## 2020-09-11 MED ORDER — INFLUENZA VAC SPLIT QUAD 0.5 ML IM SUSY
0.5000 mL | PREFILLED_SYRINGE | INTRAMUSCULAR | Status: DC
  Filled 2020-09-11: qty 0.5

## 2020-09-11 NOTE — Progress Notes (Signed)
PROGRESS NOTE    Caprice Wasko  ZOX:[id number] DOB: [birth year] DOA: 09/06/2020 PCP: Patient, No Pcp Per   Chief Complaint  Patient presents with  . Stroke Symptoms   Brief Narrative: As per hpi:54 year old with hypertension supposed to be on lisinopril 5 mg metoprolol 25 twice daily , cocaine abuse presented with presented to the ED on 09/06/2020 w/ double vision starting that day and found to have left-sided weakness.  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 Patient had been agitated tachypneic diaphoretic needing Precedex drip CIWA Ativan and on intermittent antihypertensive drip.  Following weaning off of drip, transferred to hospitalist service on 9/16.  Currently being considered for inpatient rehab.  Subjective: Patient noted to be rambling.  Sometimes he answers appropriately.  Denies any pain.  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  Blood pressure controlled on metoprolol and lisinopril.  Patient also had a core track feeding tube in.  Looks like he was cleared for dysphagia 2 diet yesterday went to modified barium swallow.  The feeding tube was removed.   Seems to be stable from a neurological standpoint.  Waiting on rehabilitation.  Delirium/acute toxic metabolic encephalopathy/alcohol withdrawal syndrome with complication  Initially on Precedex.  Has received Haldol as needed.  Was started on low-dose Seroquel.  Seen and cleared by psychiatry as well.  Continue folic acid multivitamins and thiamine.  Essential hypertension Blood pressure seems to be reasonably well controlled.  Noted to be on lisinopril and  metoprolol.  Cocaine abuse  Has been counseled.  Urine retention Patient had a coud catheter which is in place.  Continue Flomax.  Voiding trial once he is more mobile.  Labs not done in a few days.  We will order for tomorrow.   On every 4 hour CBG.  Change to [facility] at bedtime for now.  HbA1c was 5.9.     DVT prophylaxis: SCDs Code Status:   Code Status: Full Code  Family Communication: No family at bedside.  Status is: Inpatient Remains inpatient appropriate because:Unsafe d/c plan, IV treatments appropriate due to intensity of illness or inability to take PO and Inpatient level of care appropriate due to severity of illness  Dispo: The patient is from: Home              Anticipated d/c is to: CIR              Anticipated d/c date is: Once bed available, hopefully next week              Patient currently is not medically stable to d/c.     Medications: Scheduled Meds: . chlorhexidine  15 mL Mouth Rinse BID  . Chlorhexidine Gluconate Cloth  6 each Topical Daily  . folic acid  1 mg Oral Daily  . [START ON 09/12/2020] influenza vac split quadrivalent PF  0.5 mL Intramuscular Tomorrow-1000  . insulin aspart  0-15 Units Subcutaneous Q4H  . lisinopril  5 mg Oral Daily  . mouth rinse  15 mL Mouth Rinse q12n4p  . metoprolol tartrate  25 mg Oral BID  . multivitamin with minerals  1 tablet Oral Daily  . pantoprazole  40 mg Oral Daily  . [START ON 09/12/2020] pneumococcal 23 valent vaccine  0.5 mL Intramuscular Tomorrow-1000  . QUEtiapine  25 mg Oral QHS  . senna-docusate  1 tablet Oral BID  . thiamine  100 mg Oral Daily   Continuous Infusions:   Antimicrobials: Anti-infectives (From admission, onward)   None     Objective: Vitals: Today's Vitals   09/11/20 0334 09/11/20 0600 09/11/20 0726 09/11/20 0816  BP: 108/82  121/81   Pulse: 78  74   Resp: 18  18   Temp: 97.8 F (36.6 C)  98.4 F (36.9 C)   TempSrc: Oral  Oral   SpO2: 92%  96%   Weight:      Height:  5\' 7"   (1.702 m)    PainSc: 0-No pain   0-No pain    Intake/Output Summary (Last 24 hours) at 09/11/2020 1057 Last data filed at 09/11/2020 1010 Gross per 24 hour  Intake 360 ml  Output 475 ml  Net -115 ml   Filed Weights   09/06/20 2300 09/10/20 0420  Weight: 87 kg 78.7 kg   Weight change:   Intake/Output from previous day: 09/17 0701 - 09/18 0700 In: 0  Out: 475 [Urine:475] Intake/Output this shift: Total I/O In: 360 [P.O.:360] Out: -    Examination:  General appearance: Awake alert.  In no distress.  Distracted Resp: Clear to auscultation bilaterally.  Normal effort Cardio: S1-S2 is normal regular.  No S3-S4.  No rubs murmurs or bruit GI: Abdomen is soft.  Nontender nondistended.  Bowel sounds are present normal.  No masses organomegaly Extremities: Moving all his extremities Neurologic: Disoriented.  Distracted at times.  No obvious facial asymmetry.    Data Reviewed: I have personally reviewed following labs and imaging studies CBC: Recent Labs  Lab 09/06/20 1107 09/06/20 1143 09/07/20 0849  WBC 8.2  --  9.2  NEUTROABS 5.3  --   --   HGB 14.9 16.3 15.5  HCT 46.5 48.0 47.7  MCV 90.3  --  89.0  PLT 413*  --  431*   Basic Metabolic Panel: Recent Labs  Lab 09/06/20 1107 09/06/20 1143 09/07/20 0849  NA 136 136  --   K 4.9 3.8  --   CL 99 101  --   CO2 25  --   --   GLUCOSE 188* 162*  --   BUN 7 7  --   CREATININE 0.97 0.80  --   CALCIUM 9.2  --   --   MG  --   --  2.2  PHOS  --   --  4.2   GFR: Estimated Creatinine Clearance: 96.4 mL/min (by C-G formula based on SCr of 0.8 mg/dL). Liver Function Tests: Recent Labs  Lab 09/06/20 1107  AST 61*  ALT 45*  ALKPHOS 73  BILITOT 1.1  PROT 7.1  ALBUMIN 4.2   Coagulation Profile: Recent Labs  Lab 09/06/20 1211  INR 0.9   CBG: Recent Labs  Lab 09/10/20 1540 09/10/20 2027 09/11/20 0025 09/11/20 0422 09/11/20 0729  GLUCAP 96 98 100* 111* 96     Recent Results (from the past 240 hour(s))   SARS Coronavirus 2 by RT PCR (hospital order, performed in [facility] hospital lab) Nasopharyngeal Nasopharyngeal Swab     Status: None   Collection Time: 09/06/20 11:08 AM   Specimen: Nasopharyngeal Swab  Result Value Ref Range Status   SARS Coronavirus 2 NEGATIVE NEGATIVE Final    Comment: (NOTE) SARS-CoV-2 target nucleic acids  are NOT DETECTED.  The SARS-CoV-2 RNA is generally detectable in upper and lower respiratory specimens during the acute phase of infection. The lowest concentration of SARS-CoV-2 viral copies this assay can detect is 250 copies / mL. A negative result does not preclude SARS-CoV-2 infection and should not be used as the sole basis for treatment or other patient management decisions.  A negative result may occur with improper specimen collection / handling, submission of specimen other than nasopharyngeal swab, presence of viral mutation(s) within the areas targeted by this assay, and inadequate number of viral copies (<250 copies / mL). A negative result must be combined with clinical observations, patient history, and epidemiological information.  Fact Sheet for Patients:   BoilerBrush.com.cy  Fact Sheet for Healthcare Providers: https://pope.com/  This test is not yet approved or  cleared by the [place] FDA and has been authorized for detection and/or diagnosis of SARS-CoV-2 by FDA under an Emergency Use Authorization (EUA).  This EUA will remain in effect (meaning this test can be used) for the duration of the COVID-19 declaration under Section 564(b)(1) of the Act, 21 U.S.C. section 360bbb-3(b)(1), unless the authorization is terminated or revoked sooner.  Performed at [facility], [address] [place], [place] [address]   MRSA PCR Screening     Status: None   Collection Time: 09/09/20  6:00 AM  Result Value Ref Range Status   MRSA by PCR NEGATIVE NEGATIVE Final    Comment:         The GeneXpert MRSA Assay (FDA approved for NASAL specimens only), is one component of a comprehensive MRSA colonization surveillance program. It is not intended to diagnose MRSA infection nor to guide or monitor treatment for MRSA infections. Performed at [facility], [address], [place], [place] [address]      Radiology Studies: DG Swallowing Func-Speech Pathology  Result Date: 09/10/2020 Objective Swallowing Evaluation: Type of Study: MBS-Modified Barium Swallow Study  Patient Details Name: Keghan Mcfarren MRN: [id number] Date of Birth: 03/03/1966 Today's Date: 09/10/2020 Time: SLP Start Time (ACUTE ONLY): 1150 -SLP Stop Time (ACUTE ONLY): 1206 SLP Time Calculation (min) (ACUTE ONLY): 16 min Past Medical History: No past medical history on file. Past Surgical History: No past surgical history on file. HPI: Pt is a 54 yo male presenting with L sided numbness/weakness and dysarthria. CT revealed a pontine hemorrhage. PMH includes: polysubstance abuse (alcohol, cocaine), TIA, CVA, HTN  Subjective: pleasant, bilateral mitts on arms, follows all directions, not consistently verbal Assessment / Plan / Recommendation CHL IP CLINICAL IMPRESSIONS 09/10/2020 Clinical Impression Patient presents with a primary oral dysphagia leading to premature spillage of puree solids and thin liquids boluses and with initiation of swallow starting at level of vallecular sinus. Patient did not exhibit any aspiration or penetration with any boluses, even with large volume straw sips of thin liquids, and only trace to mild pharyngeal residuals that appeared to be around cortrak tube in pharynx. No retrograde movement of boluses noted. Patient exhibite delayed mastication, weak lingual maniuplation and bolus transit in oral cavity from anterior to posterior position. SLP Visit Diagnosis Dysphagia, oropharyngeal phase (R13.12) Attention and concentration deficit following -- Frontal lobe and executive function deficit  following -- Impact on safety and function Mild aspiration risk   CHL IP TREATMENT RECOMMENDATION 09/10/2020 Treatment Recommendations Therapy as outlined in treatment plan below   Prognosis 09/10/2020 Prognosis for Safe Diet Advancement Good Barriers to Reach Goals -- Barriers/Prognosis Comment -- CHL IP DIET RECOMMENDATION  09/10/2020 SLP Diet Recommendations Thin liquid;Dysphagia 2 (Fine chop) solids Liquid Administration via Cup;Straw Medication Administration Crushed with puree Compensations Minimize environmental distractions;Slow rate;Small sips/bites Postural Changes --   CHL IP OTHER RECOMMENDATIONS 09/10/2020 Recommended Consults -- Oral Care Recommendations Oral care BID Other Recommendations --   CHL IP FOLLOW UP RECOMMENDATIONS 09/10/2020 Follow up Recommendations Inpatient Rehab   CHL IP FREQUENCY AND DURATION 09/10/2020 Speech Therapy Frequency (ACUTE ONLY) min 2x/week Treatment Duration 2 weeks      CHL IP ORAL PHASE 09/10/2020 Oral Phase Impaired Oral - Pudding Teaspoon -- Oral - Pudding Cup -- Oral - Honey Teaspoon -- Oral - Honey Cup -- Oral - Nectar Teaspoon -- Oral - Nectar Cup -- Oral - Nectar Straw -- Oral - Thin Teaspoon -- Oral - Thin Cup Reduced posterior propulsion;Premature spillage Oral - Thin Straw Premature spillage Oral - Puree Weak lingual manipulation;Reduced posterior propulsion;Delayed oral transit;Premature spillage Oral - Mech Soft Impaired mastication;Weak lingual manipulation;Reduced posterior propulsion;Delayed oral transit Oral - Regular -- Oral - Multi-Consistency -- Oral - Pill Decreased bolus cohesion;Other (Comment) Oral Phase - Comment --  CHL IP PHARYNGEAL PHASE 09/10/2020 Pharyngeal Phase Impaired Pharyngeal- Pudding Teaspoon -- Pharyngeal -- Pharyngeal- Pudding Cup -- Pharyngeal -- Pharyngeal- Honey Teaspoon -- Pharyngeal -- Pharyngeal- Honey Cup -- Pharyngeal -- Pharyngeal- Nectar Teaspoon -- Pharyngeal -- Pharyngeal- Nectar Cup -- Pharyngeal -- Pharyngeal- Nectar Straw --  Pharyngeal -- Pharyngeal- Thin Teaspoon -- Pharyngeal -- Pharyngeal- Thin Cup Delayed swallow initiation-vallecula Pharyngeal -- Pharyngeal- Thin Straw Delayed swallow initiation-vallecula;Delayed swallow initiation-pyriform sinuses Pharyngeal -- Pharyngeal- Puree Delayed swallow initiation-vallecula Pharyngeal -- Pharyngeal- Mechanical Soft WFL Pharyngeal -- Pharyngeal- Regular -- Pharyngeal -- Pharyngeal- Multi-consistency -- Pharyngeal -- Pharyngeal- Pill -- Pharyngeal -- Pharyngeal Comment --  CHL IP CERVICAL ESOPHAGEAL PHASE 09/10/2020 Cervical Esophageal Phase WFL Pudding Teaspoon -- Pudding Cup -- Honey Teaspoon -- Honey Cup -- Nectar Teaspoon -- Nectar Cup -- Nectar Straw -- Thin Teaspoon -- Thin Cup -- Thin Straw -- Puree -- Mechanical Soft -- Regular -- Multi-consistency -- Pill -- Cervical Esophageal Comment -- Angela NevinJohn T. Preston, MA, CCC-SLP Speech Therapy [facility] Acute Rehab               LOS: 5 days   Osvaldo ShipperGokul Raesean Bartoletti, MD [facility]  09/11/2020, 10:57 AM

## 2020-09-12 LAB — CBC
HCT: 48.1 % (ref 39.0–52.0)
Hemoglobin: 15.1 g/dL (ref 13.0–17.0)
MCH: 29 pg (ref 26.0–34.0)
MCHC: 31.4 g/dL (ref 30.0–36.0)
MCV: 92.5 fL (ref 80.0–100.0)
Platelets: 405 10*3/uL — ABNORMAL HIGH (ref 150–400)
RBC: 5.2 MIL/uL (ref 4.22–5.81)
RDW: 13.6 % (ref 11.5–15.5)
WBC: 5.8 10*3/uL (ref 4.0–10.5)
nRBC: 0 % (ref 0.0–0.2)

## 2020-09-12 LAB — GLUCOSE, CAPILLARY
Glucose-Capillary: 101 mg/dL — ABNORMAL HIGH (ref 70–99)
Glucose-Capillary: 88 mg/dL (ref 70–99)

## 2020-09-12 LAB — COMPREHENSIVE METABOLIC PANEL
ALT: 26 U/L (ref 0–44)
AST: 29 U/L (ref 15–41)
Albumin: 3.4 g/dL — ABNORMAL LOW (ref 3.5–5.0)
Alkaline Phosphatase: 68 U/L (ref 38–126)
Anion gap: 12 (ref 5–15)
BUN: 17 mg/dL (ref 6–20)
CO2: 27 mmol/L (ref 22–32)
Calcium: 9.7 mg/dL (ref 8.9–10.3)
Chloride: 104 mmol/L (ref 98–111)
Creatinine, Ser: 0.97 mg/dL (ref 0.61–1.24)
GFR calc Af Amer: >60 mL/min (ref >60)
GFR calc non Af Amer: >60 mL/min (ref >60)
Glucose, Bld: 112 mg/dL — ABNORMAL HIGH (ref 70–99)
Potassium: 3.8 mmol/L (ref 3.5–5.1)
Sodium: 143 mmol/L (ref 135–145)
Total Bilirubin: 0.8 mg/dL (ref 0.3–1.2)
Total Protein: 7.3 g/dL (ref 6.5–8.1)

## 2020-09-12 LAB — MAGNESIUM: Magnesium: 2.4 mg/dL (ref 1.7–2.4)

## 2020-09-12 NOTE — Progress Notes (Signed)
  Speech Language Pathology Treatment: Dysphagia  Patient Details Name: Zachary Flores MRN: [id number] DOB: 01-21-1966 Today's Date: 09/12/2020 Time: [id number] SLP Time Calculation (min) (ACUTE ONLY): 12 min  Assessment / Plan / Recommendation Clinical Impression  Pt was seen for dysphagia treatment. He was alert and cooperative throughout the session. Clarification was intermittently required due to reduced speech intelligibility. Pt tolerated dysphagia 2 solids and thin liquids via straw. Mastication was prolonged with regular texture solids but functional with dysphagia 2. Residue was noted in the right lateral sulcus and on the superior lingual surface with solids but eliminated with liquid washes. It is recommended that the current diet be continued and SLP will continue to follow pt.    HPI HPI: Pt is a 55 yo male presenting with L sided numbness/weakness and dysarthria. CT revealed a pontine hemorrhage. PMH includes: polysubstance abuse (alcohol, cocaine), TIA, CVA, HTN      SLP Plan  Continue with current plan of care       Recommendations  Diet recommendations: Dysphagia 2 (fine chop);Thin liquid Liquids provided via: Cup;Straw Medication Administration: Crushed with puree Supervision: Patient able to self feed Compensations: Minimize environmental distractions;Slow rate;Small sips/bites;Follow solids with liquid Postural Changes and/or Swallow Maneuvers: Seated upright 90 degrees;Upright 30-60 min after meal                Oral Care Recommendations: Oral care BID Follow up Recommendations: Inpatient Rehab SLP Visit Diagnosis: Dysphagia, oropharyngeal phase (R13.12) Plan: Continue with current plan of care       Tamara Kenyon I. Vear Clock, MS, CCC-SLP Acute Rehabilitation Services Office number [phone] Pager [phone]                Scheryl Marten 09/12/2020, 2:02 PM

## 2020-09-12 NOTE — Progress Notes (Signed)
PROGRESS NOTE    Zachary Flores  KPV:[id number] DOB: 23-Oct-1966 DOA: 09/06/2020 PCP: Zachary Flores, No Pcp Per   Chief Complaint  Zachary Flores presents with  . Stroke Symptoms   Brief Narrative: As per hpi:54 year old with hypertension supposed to be on lisinopril 5 mg metoprolol 25 twice daily , cocaine abuse presented with presented to the ED on 09/06/2020 w/ double vision starting that day and found to have left-sided weakness.  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 Zachary Flores had been agitated tachypneic diaphoretic needing Precedex drip CIWA Ativan and on intermittent antihypertensive drip.  Following weaning off of drip, transferred to hospitalist service on 9/16.  Currently being considered for inpatient rehab.  Subjective: Zachary Flores is noted to be stable.  No overnight events.  He feels well.  Denies any complaints at this time.  No nausea or vomiting.  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   Zachary Flores also had a core track feeding tube which was removed after he did well on a modified barium swallow.  Seems to be tolerating his diet well.  Seems to be stable from a neurological standpoint.  Delirium/acute toxic metabolic encephalopathy/alcohol withdrawal syndrome with complication  Initially on Precedex.  Has received Haldol as needed.  Was started on low-dose Seroquel.  Seen and cleared by psychiatry as well.  Continue folic acid multivitamins and thiamine. Behavior seems to be appropriate.  Seems to be tolerating his medications well.  Essential hypertension Blood pressure is reasonably well controlled.  Continue lisinopril and metoprolol.   Labs done this morning are stable.  Cocaine abuse  Has been counseled.  Urine retention Zachary Flores had a coud catheter which is in place.  Continue Flomax.  Voiding trial once he is more mobile.  DVT prophylaxis: SCDs Code Status:   Code Status: Full Code  Family Communication: No family at bedside.  Status is: Inpatient Remains inpatient appropriate because:Unsafe d/c plan, IV treatments appropriate due to intensity of illness or inability to take PO and Inpatient level of care appropriate due to severity of illness  Dispo: The Zachary Flores is from: Home              Anticipated d/c is to: CIR              Anticipated d/c date is: Once bed available, hopefully next week              Zachary Flores currently is not medically stable to d/c.     Medications: Scheduled Meds: . chlorhexidine  15 mL Mouth Rinse BID  . Chlorhexidine Gluconate Cloth  6 each Topical Daily  . folic acid  1 mg Oral Daily  . influenza vac split quadrivalent PF  0.5 mL Intramuscular Tomorrow-1000  . lisinopril  5 mg Oral Daily  . mouth rinse  15 mL Mouth Rinse q12n4p  . metoprolol tartrate  25 mg Oral BID  . multivitamin with minerals  1 tablet Oral Daily  . pantoprazole  40 mg Oral Daily  . pneumococcal 23 valent vaccine  0.5 mL Intramuscular Tomorrow-1000  . QUEtiapine  25 mg Oral QHS  . senna-docusate  1 tablet Oral BID  . thiamine  100 mg  Oral Daily   Continuous Infusions:   Antimicrobials: Anti-infectives (From admission, onward)   None     Objective: Vitals: Today's Vitals   09/11/20 2315 09/12/20 0354 09/12/20 0724 09/12/20 0735  BP: 102/82 119/78 129/88   Pulse: 73 73 84   Resp: 16 16 16    Temp: 98.2 F (36.8 C) 98.1 F (36.7 C) 98.6 F (37 C)   TempSrc: Oral Oral Oral   SpO2: 95% 91% 98%   Weight:      Height:      PainSc:    0-No pain    Intake/Output Summary (Last 24 hours) at 09/12/2020 0858 Last data filed at 09/11/2020 1700 Gross per 24 hour  Intake 800 ml  Output 700 ml  Net  100 ml   Filed Weights   09/06/20 2300 09/10/20 0420  Weight: 87 kg 78.7 kg   Weight change:   Intake/Output from previous day: 09/18 0701 - 09/19 0700 In: 800 [P.O.:800] Out: 700 [Urine:700] Intake/Output this shift: No intake/output data recorded.   Examination:  General appearance: Awake alert.  In no distress.  Mildly distracted Resp: Clear to auscultation bilaterally.  Normal effort Cardio: S1-S2 is normal regular.  No S3-S4.  No rubs murmurs or bruit GI: Abdomen is soft.  Nontender nondistended.  Bowel sounds are present normal.  No masses organomegaly Extremities: No edema.       Data Reviewed: I have personally reviewed following labs and imaging studies CBC: Recent Labs  Lab 09/06/20 1107 09/06/20 1143 09/07/20 0849 09/12/20 0726  WBC 8.2  --  9.2 5.8  NEUTROABS 5.3  --   --   --   HGB 14.9 16.3 15.5 15.1  HCT 46.5 48.0 47.7 48.1  MCV 90.3  --  89.0 92.5  PLT 413*  --  431* 405*   Basic Metabolic Panel: Recent Labs  Lab 09/06/20 1107 09/06/20 1143 09/07/20 0849 09/12/20 0726  NA 136 136  --  143  K 4.9 3.8  --  3.8  CL 99 101  --  104  CO2 25  --   --  27  GLUCOSE 188* 162*  --  112*  BUN 7 7  --  17  CREATININE 0.97 0.80  --  0.97  CALCIUM 9.2  --   --  9.7  MG  --   --  2.2 2.4  PHOS  --   --  4.2  --    GFR: Estimated Creatinine Clearance: 79.5 mL/min (by C-G formula based on SCr of 0.97 mg/dL). Liver Function Tests: Recent Labs  Lab 09/06/20 1107 09/12/20 0726  AST 61* 29  ALT 45* 26  ALKPHOS 73 68  BILITOT 1.1 0.8  PROT 7.1 7.3  ALBUMIN 4.2 3.4*   Coagulation Profile: Recent Labs  Lab 09/06/20 1211  INR 0.9   CBG: Recent Labs  Lab 09/11/20 0422 09/11/20 0729 09/11/20 1147 09/11/20 1602 09/12/20 0622  GLUCAP 111* 96 106* 108* 101*     Recent Results (from the past 240 hour(s))  SARS Coronavirus 2 by RT PCR (hospital order, performed in [facility] hospital lab) Nasopharyngeal Nasopharyngeal Swab     Status: None     Collection Time: 09/06/20 11:08 AM   Specimen: Nasopharyngeal Swab  Result Value Ref Range Status   SARS Coronavirus 2 NEGATIVE NEGATIVE Final    Comment: (NOTE) SARS-CoV-2 target nucleic acids are NOT DETECTED.  The SARS-CoV-2 RNA is generally detectable in upper and lower respiratory specimens during the acute phase of  infection. The lowest concentration of SARS-CoV-2 viral copies this assay can detect is 250 copies / mL. A negative result does not preclude SARS-CoV-2 infection and should not be used as the sole basis for treatment or other Zachary Flores management decisions.  A negative result may occur with improper specimen collection / handling, submission of specimen other than nasopharyngeal swab, presence of viral mutation(s) within the areas targeted by this assay, and inadequate number of viral copies (<250 copies / mL). A negative result must be combined with clinical observations, Zachary Flores history, and epidemiological information.  Fact Sheet for Patients:   BoilerBrush.com.cyhttps://www.fda.gov/media/136312/download  Fact Sheet for Healthcare Providers: https://pope.com/https://www.fda.gov/media/136313/download  This test is not yet approved or  cleared by the [place] FDA and has been authorized for detection and/or diagnosis of SARS-CoV-2 by FDA under an Emergency Use Authorization (EUA).  This EUA will remain in effect (meaning this test can be used) for the duration of the COVID-19 declaration under Section 564(b)(1) of the Act, 21 U.S.C. section 360bbb-3(b)(1), unless the authorization is terminated or revoked sooner.  Performed at [facility], [address] [place], [place] [address]   MRSA PCR Screening     Status: None   Collection Time: 09/09/20  6:00 AM  Result Value Ref Range Status   MRSA by PCR NEGATIVE NEGATIVE Final    Comment:        The GeneXpert MRSA Assay (FDA approved for NASAL specimens only), is one component of a comprehensive MRSA colonization surveillance  program. It is not intended to diagnose MRSA infection nor to guide or monitor treatment for MRSA infections. Performed at [facility], [address] [place], [place] [address]      Radiology Studies: DG Swallowing Func-Speech Pathology  Result Date: 09/10/2020 Objective Swallowing Evaluation: Type of Study: MBS-Modified Barium Swallow Study  Zachary Flores Details Name: Jonelle SidleBobby Don MRN: [id number] Date of Birth: Dec 30, 1965 Today's Date: 09/10/2020 Time: SLP Start Time (ACUTE ONLY): 1150 -SLP Stop Time (ACUTE ONLY): 1206 SLP Time Calculation (min) (ACUTE ONLY): 16 min Past Medical History: No past medical history on file. Past Surgical History: No past surgical history on file. HPI: Pt is a 54 yo male presenting with L sided numbness/weakness and dysarthria. CT revealed a pontine hemorrhage. PMH includes: polysubstance abuse (alcohol, cocaine), TIA, CVA, HTN  Subjective: pleasant, bilateral mitts on arms, follows all directions, not consistently verbal Assessment / Plan / Recommendation CHL IP CLINICAL IMPRESSIONS 09/10/2020 Clinical Impression Zachary Flores presents with a primary oral dysphagia leading to premature spillage of puree solids and thin liquids boluses and with initiation of swallow starting at level of vallecular sinus. Zachary Flores did not exhibit any aspiration or penetration with any boluses, even with large volume straw sips of thin liquids, and only trace to mild pharyngeal residuals that appeared to be around cortrak tube in pharynx. No retrograde movement of boluses noted. Zachary Flores exhibite delayed mastication, weak lingual maniuplation and bolus transit in oral cavity from anterior to posterior position. SLP Visit Diagnosis Dysphagia, oropharyngeal phase (R13.12) Attention and concentration deficit following -- Frontal lobe and executive function deficit following -- Impact on safety and function Mild aspiration risk   CHL IP TREATMENT RECOMMENDATION 09/10/2020 Treatment Recommendations  Therapy as outlined in treatment plan below   Prognosis 09/10/2020 Prognosis for Safe Diet Advancement Good Barriers to Reach Goals -- Barriers/Prognosis Comment -- CHL IP DIET RECOMMENDATION 09/10/2020 SLP Diet Recommendations Thin liquid;Dysphagia 2 (Fine chop) solids Liquid Administration via Cup;Straw Medication Administration Crushed with puree Compensations Minimize  environmental distractions;Slow rate;Small sips/bites Postural Changes --   CHL IP OTHER RECOMMENDATIONS 09/10/2020 Recommended Consults -- Oral Care Recommendations Oral care BID Other Recommendations --   CHL IP FOLLOW UP RECOMMENDATIONS 09/10/2020 Follow up Recommendations Inpatient Rehab   CHL IP FREQUENCY AND DURATION 09/10/2020 Speech Therapy Frequency (ACUTE ONLY) min 2x/week Treatment Duration 2 weeks      CHL IP ORAL PHASE 09/10/2020 Oral Phase Impaired Oral - Pudding Teaspoon -- Oral - Pudding Cup -- Oral - Honey Teaspoon -- Oral - Honey Cup -- Oral - Nectar Teaspoon -- Oral - Nectar Cup -- Oral - Nectar Straw -- Oral - Thin Teaspoon -- Oral - Thin Cup Reduced posterior propulsion;Premature spillage Oral - Thin Straw Premature spillage Oral - Puree Weak lingual manipulation;Reduced posterior propulsion;Delayed oral transit;Premature spillage Oral - Mech Soft Impaired mastication;Weak lingual manipulation;Reduced posterior propulsion;Delayed oral transit Oral - Regular -- Oral - Multi-Consistency -- Oral - Pill Decreased bolus cohesion;Other (Comment) Oral Phase - Comment --  CHL IP PHARYNGEAL PHASE 09/10/2020 Pharyngeal Phase Impaired Pharyngeal- Pudding Teaspoon -- Pharyngeal -- Pharyngeal- Pudding Cup -- Pharyngeal -- Pharyngeal- Honey Teaspoon -- Pharyngeal -- Pharyngeal- Honey Cup -- Pharyngeal -- Pharyngeal- Nectar Teaspoon -- Pharyngeal -- Pharyngeal- Nectar Cup -- Pharyngeal -- Pharyngeal- Nectar Straw -- Pharyngeal -- Pharyngeal- Thin Teaspoon -- Pharyngeal -- Pharyngeal- Thin Cup Delayed swallow initiation-vallecula Pharyngeal --  Pharyngeal- Thin Straw Delayed swallow initiation-vallecula;Delayed swallow initiation-pyriform sinuses Pharyngeal -- Pharyngeal- Puree Delayed swallow initiation-vallecula Pharyngeal -- Pharyngeal- Mechanical Soft WFL Pharyngeal -- Pharyngeal- Regular -- Pharyngeal -- Pharyngeal- Multi-consistency -- Pharyngeal -- Pharyngeal- Pill -- Pharyngeal -- Pharyngeal Comment --  CHL IP CERVICAL ESOPHAGEAL PHASE 09/10/2020 Cervical Esophageal Phase WFL Pudding Teaspoon -- Pudding Cup -- Honey Teaspoon -- Honey Cup -- Nectar Teaspoon -- Nectar Cup -- Nectar Straw -- Thin Teaspoon -- Thin Cup -- Thin Straw -- Puree -- Mechanical Soft -- Regular -- Multi-consistency -- Pill -- Cervical Esophageal Comment -- Angela Nevin, MA, CCC-SLP Speech Therapy [facility] Acute Rehab               LOS: 6 days   Osvaldo Shipper, MD [facility]  09/12/2020, 8:58 AM

## 2020-09-13 DIAGNOSIS — M79602 Pain in left arm: Secondary | ICD-10-CM

## 2020-09-13 LAB — GLUCOSE, CAPILLARY
Glucose-Capillary: 106 mg/dL — ABNORMAL HIGH (ref 70–99)
Glucose-Capillary: 77 mg/dL (ref 70–99)
Glucose-Capillary: 77 mg/dL (ref 70–99)

## 2020-09-13 NOTE — Progress Notes (Signed)
Inpatient Rehab Admissions Coordinator:   Met with patient at bedside.  He remains confused, asking if I can take his hand and walk him back to [facility].  Reoriented pt to [facility] and need for rehab, but no carryover.  I have not heard back from pt's family; I left a card in his room and I will try them again this afternoon.   Shann Medal, PT, DPT Admissions Coordinator [phone] 09/13/20  12:15 PM

## 2020-09-13 NOTE — Progress Notes (Signed)
Physical Therapy Treatment Patient Details Name: Zachary Flores MRN: [id number] DOB: 10-Nov-1966 Today's Date: 09/13/2020    History of Present Illness Pt is a 54 yo male presenting with L sided numbness/weakness and dysarthria. CT revealed a pontine hemorrhage. PMH includes: polysubstance abuse (alcohol, cocaine), TIA, CVA, HTN    PT Comments    Patient alert and following most simple commands. Using full hip/trunk flexion as coming to sit at EOB, able to work towards upright sitting with min assist and then progress to standing with +2  Mod assist. Unable to progress to ambulation due to strong rt lean in standing.     Follow Up Recommendations  CIR     Equipment Recommendations  Other (comment) (defer)    Recommendations for Other Services Rehab consult     Precautions / Restrictions Precautions Precautions: Fall Precaution Comments:  keep SBP 120-140    Mobility  Bed Mobility Overal bed mobility: Needs Assistance Bed Mobility: Rolling;Sidelying to Sit;Sit to Sidelying Rolling: Mod assist Sidelying to sit: Max assist;HOB elevated;+2 for physical assistance     Sit to sidelying: Mod assist;+2 for physical assistance General bed mobility comments: Due to recent strong extension in sitting, began with placing blanket under pt to use to "cocoon" patient and try to maintain flexion in sitting. Pt rolled onto side and hips/knees flexed and brought over EOB; return to side, pt pushed into extension and ended up going onto his back and +2 to pull up toward [facility]  Transfers Overall transfer level: Needs assistance Equipment used: 2 person hand held assist Transfers: Sit to/from Stand Sit to Stand: Mod assist         General transfer comment: repeated x 2 with most support on his left to counter his tendency to lean to his right; good strength/support in bil LEs, however unable to safely advance to walking; +attempted lateral wtshifting  Ambulation/Gait              General Gait Details: unable   Stairs             Wheelchair Mobility    Modified Rankin (Stroke Patients Only) Modified Rankin (Stroke Patients Only) Pre-Morbid Rankin Score: No significant disability Modified Rankin: Severe disability     Balance Overall balance assessment: Needs assistance Sitting-balance support: Feet supported;Bilateral upper extremity supported Sitting balance-Leahy Scale: Poor Sitting balance - Comments: able to progress from forward flexed position with max input/support via blanket around hips/torso, to upright sitting without needing blanket with min assist; pt able to shift forward into flexion if cued to look at his socks and prevent posterior lean Postural control: Posterior lean;Right lateral lean Standing balance support: Bilateral upper extremity supported Standing balance-Leahy Scale: Poor                              Cognition Arousal/Alertness: Awake/alert Behavior During Therapy: Impulsive;Restless Overall Cognitive Status: No family/caregiver present to determine baseline cognitive functioning Area of Impairment: Attention;Following commands;Safety/judgement;Problem solving;Awareness                   Current Attention Level: Focused;Sustained   Following Commands: Follows one step commands with increased time;Follows one step commands inconsistently Safety/Judgement: Decreased awareness of deficits;Decreased awareness of safety Awareness: Intellectual Problem Solving: Slow processing;Decreased initiation;Difficulty sequencing;Requires verbal cues;Requires tactile cues General Comments: Able to state he was in the hospital in [place] with a stroke.      Exercises      General Comments  Pertinent Vitals/Pain Pain Assessment: No/denies pain Faces Pain Scale: No hurt    Home Living                      Prior Function            PT Goals (current goals can now be found in the care  plan section) Acute Rehab PT Goals Patient Stated Goal: Agreeable to mobilize Time For Goal Achievement: 09/22/20 Potential to Achieve Goals: Fair Progress towards PT goals: Progressing toward goals    Frequency    Min 4X/week      PT Plan Current plan remains appropriate    Co-evaluation              AM-PAC PT "6 Clicks" Mobility   Outcome Measure  Help needed turning from your back to your side while in a flat bed without using bedrails?: A Lot Help needed moving from lying on your back to sitting on the side of a flat bed without using bedrails?: A Lot Help needed moving to and from a bed to a chair (including a wheelchair)?: Total Help needed standing up from a chair using your arms (e.g., wheelchair or bedside chair)?: A Lot Help needed to walk in hospital room?: Total Help needed climbing 3-5 steps with a railing? : Total 6 Click Score: 9    End of Session   Activity Tolerance: Patient limited by fatigue Patient left: in bed;with call bell/phone within reach;with bed alarm set;with SCD's reapplied   PT Visit Diagnosis: Other abnormalities of gait and mobility (R26.89);Other symptoms and signs involving the nervous system (R29.898);Hemiplegia and hemiparesis Hemiplegia - Right/Left: Left Hemiplegia - dominant/non-dominant: Non-dominant Hemiplegia - caused by: Nontraumatic intracerebral hemorrhage     Time: 1501-1520 PT Time Calculation (min) (ACUTE ONLY): 19 min  Charges:  $Neuromuscular Re-education: 8-22 mins                      Zachary Flores, PT Pager [phone]    Zachary Flores 09/13/2020, 5:28 PM

## 2020-09-13 NOTE — Progress Notes (Signed)
PROGRESS NOTE    Zachary Flores  XBL:[id number] DOB: Feb 07, 1966 DOA: 09/06/2020 PCP: Patient, No Pcp Per   Chief Complaint  Patient presents with  . Stroke Symptoms   Brief Narrative: As per hpi:54 year old with hypertension supposed to be on lisinopril 5 mg metoprolol 25 twice daily , cocaine abuse presented with presented to the ED on 09/06/2020 w/ double vision starting that day and found to have left-sided weakness.  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 Patient had been agitated tachypneic diaphoretic needing Precedex drip CIWA Ativan and on intermittent antihypertensive drip.  Following weaning off of drip, transferred to hospitalist service on 9/16.  Currently being considered for inpatient rehab.  Subjective: Patient complains of pain in his left arm.  Denies any chest pain or shortness of breath.  The pain worsens when he tries to lift his arm up.  Denies any injuries or falls.    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   Patient also had a core track feeding tube which was removed after he did well on a modified barium swallow.  SLP continues to follow.  Diet being advanced slowly.  Currently on a dysphagia 2 diet. Seems to be stable from a neurological standpoint.  Delirium/acute toxic metabolic encephalopathy/alcohol withdrawal syndrome with complication  Initially on Precedex.  Has received Haldol as needed.  Was started on low-dose Seroquel.  Seen and cleared by psychiatry as well.  Continue folic acid multivitamins and thiamine. From a behavioral standpoint he is stable.  Tolerating his medications well.    Essential  hypertension Blood pressure is reasonably well controlled.  Continue lisinopril and metoprolol.  Creatinine was normal on 9/19.  Left arm pain Probably musculoskeletal or related to his deficits from stroke.  No obvious joint swellings noted.  No evidence for any injuries.  No clear indication for imaging studies at this time.  Try analgesic agents and continue to monitor.  Cocaine abuse  Has been counseled.  Urine retention Patient had a coud catheter which is in place.  Continue Flomax.  Voiding trial once he is more mobile.  DVT prophylaxis: SCDs Code Status:   Code Status: Full Code  Family Communication: No family at bedside.  Status is: Inpatient Remains inpatient appropriate because:Unsafe d/c plan, IV treatments appropriate due to intensity of illness or inability to take PO and Inpatient level of care appropriate due to severity of illness  Dispo: The patient is from: Home              Anticipated d/c is to: CIR              Anticipated d/c date is: Once bed available, hopefully next week              Patient currently is not medically stable to d/c.     Medications: Scheduled Meds: . chlorhexidine  15 mL Mouth Rinse BID  . Chlorhexidine Gluconate Cloth  6 each Topical Daily  . folic acid  1 mg Oral Daily  . influenza vac split quadrivalent PF  0.5 mL Intramuscular Tomorrow-1000  . lisinopril  5 mg Oral Daily  . mouth rinse  15 mL Mouth  Rinse q12n4p  . metoprolol tartrate  25 mg Oral BID  . multivitamin with minerals  1 tablet Oral Daily  . pantoprazole  40 mg Oral Daily  . pneumococcal 23 valent vaccine  0.5 mL Intramuscular Tomorrow-1000  . QUEtiapine  25 mg Oral QHS  . senna-docusate  1 tablet Oral BID  . thiamine  100 mg Oral Daily   Continuous Infusions:   Antimicrobials: Anti-infectives (From admission, onward)   None     Objective: Vitals: Today's Vitals   09/13/20 0000 09/13/20 0421 09/13/20 0423 09/13/20 0730  BP: 127/79 110/80    Pulse: 62 62     Resp: 17 18    Temp: 97.9 F (36.6 C) 98.2 F (36.8 C)    TempSrc: Oral Oral    SpO2: 100% 99%    Weight:   77.7 kg   Height:      PainSc:    0-No pain    Intake/Output Summary (Last 24 hours) at 09/13/2020 0846 Last data filed at 09/13/2020 0300 Gross per 24 hour  Intake 340 ml  Output 725 ml  Net -385 ml   Filed Weights   09/06/20 2300 09/10/20 0420 09/13/20 0423  Weight: 87 kg 78.7 kg 77.7 kg   Weight change:   Intake/Output from previous day: 09/19 0701 - 09/20 0700 In: 660 [P.O.:660] Out: 725 [Urine:725] Intake/Output this shift: No intake/output data recorded.   Examination:  General appearance: Awake alert.  In no distress Resp: Clear to auscultation bilaterally.  Normal effort Cardio: S1-S2 is normal regular.  No S3-S4.  No rubs murmurs or bruit GI: Abdomen is soft.  Nontender nondistended.  Bowel sounds are present normal.  No masses organomegaly Extremities: No obvious swelling noted over the joints of the left arm including shoulder elbow wrist.  He is able to lift his arm up although with limitation.  Probably due to stroke.  Good range of motion of all joints without any pain. Neurologic: Left-sided weakness noted.    Data Reviewed: I have personally reviewed following labs and imaging studies CBC: Recent Labs  Lab 09/06/20 1107 09/06/20 1143 09/07/20 0849 09/12/20 0726  WBC 8.2  --  9.2 5.8  NEUTROABS 5.3  --   --   --   HGB 14.9 16.3 15.5 15.1  HCT 46.5 48.0 47.7 48.1  MCV 90.3  --  89.0 92.5  PLT 413*  --  431* 405*   Basic Metabolic Panel: Recent Labs  Lab 09/06/20 1107 09/06/20 1143 09/07/20 0849 09/12/20 0726  NA 136 136  --  143  K 4.9 3.8  --  3.8  CL 99 101  --  104  CO2 25  --   --  27  GLUCOSE 188* 162*  --  112*  BUN 7 7  --  17  CREATININE 0.97 0.80  --  0.97  CALCIUM 9.2  --   --  9.7  MG  --   --  2.2 2.4  PHOS  --   --  4.2  --    GFR: Estimated Creatinine Clearance: 79.5 mL/min (by C-G formula based on SCr of  0.97 mg/dL). Liver Function Tests: Recent Labs  Lab 09/06/20 1107 09/12/20 0726  AST 61* 29  ALT 45* 26  ALKPHOS 73 68  BILITOT 1.1 0.8  PROT 7.1 7.3  ALBUMIN 4.2 3.4*   Coagulation Profile: Recent Labs  Lab 09/06/20 1211  INR 0.9   CBG: Recent Labs  Lab 09/11/20 1147 09/11/20 1602 09/12/20 3557  09/12/20 2046 09/13/20 0603  GLUCAP 106* 108* 101* 88 77     Recent Results (from the past 240 hour(s))  SARS Coronavirus 2 by RT PCR (hospital order, performed in [facility] hospital lab) Nasopharyngeal Nasopharyngeal Swab     Status: None   Collection Time: 09/06/20 11:08 AM   Specimen: Nasopharyngeal Swab  Result Value Ref Range Status   SARS Coronavirus 2 NEGATIVE NEGATIVE Final    Comment: (NOTE) SARS-CoV-2 target nucleic acids are NOT DETECTED.  The SARS-CoV-2 RNA is generally detectable in upper and lower respiratory specimens during the acute phase of infection. The lowest concentration of SARS-CoV-2 viral copies this assay can detect is 250 copies / mL. A negative result does not preclude SARS-CoV-2 infection and should not be used as the sole basis for treatment or other patient management decisions.  A negative result may occur with improper specimen collection / handling, submission of specimen other than nasopharyngeal swab, presence of viral mutation(s) within the areas targeted by this assay, and inadequate number of viral copies (<250 copies / mL). A negative result must be combined with clinical observations, patient history, and epidemiological information.  Fact Sheet for Patients:   BoilerBrush.com.cy  Fact Sheet for Healthcare Providers: https://pope.com/  This test is not yet approved or  cleared by the [place] FDA and has been authorized for detection and/or diagnosis of SARS-CoV-2 by FDA under an Emergency Use Authorization (EUA).  This EUA will remain in effect (meaning this test can be  used) for the duration of the COVID-19 declaration under Section 564(b)(1) of the Act, 21 U.S.C. section 360bbb-3(b)(1), unless the authorization is terminated or revoked sooner.  Performed at [facility], [address] [place], [place] [address]   MRSA PCR Screening     Status: None   Collection Time: 09/09/20  6:00 AM  Result Value Ref Range Status   MRSA by PCR NEGATIVE NEGATIVE Final    Comment:        The GeneXpert MRSA Assay (FDA approved for NASAL specimens only), is one component of a comprehensive MRSA colonization surveillance program. It is not intended to diagnose MRSA infection nor to guide or monitor treatment for MRSA infections. Performed at [facility], [address] [place], [place] [address]      Radiology Studies: No results found.   LOS: 7 days   Osvaldo Shipper, MD [facility]  09/13/2020, 8:46 AM

## 2020-09-13 NOTE — Progress Notes (Signed)
Inpatient Rehab Admissions Coordinator:   I was able to speak to pt's sister, [name], who confirms pt will have adequate support at discharge from CIR. I reviewed cost of care and process for medicaid screening and she verbalized understanding.  I will reach out to [employer] with her contact info so they can continue their screening process.  Will follow for potential admit this week pending bed availability.    Estill Dooms, PT, DPT Admissions Coordinator [phone] 09/13/20  4:02 PM

## 2020-09-14 LAB — GLUCOSE, CAPILLARY
Glucose-Capillary: 87 mg/dL (ref 70–99)
Glucose-Capillary: 106 mg/dL — ABNORMAL HIGH (ref 70–99)

## 2020-09-14 MED ORDER — ENSURE ENLIVE PO LIQD
237.0000 mL | Freq: Three times a day (TID) | ORAL | Status: DC
  Administered 2020-09-14 – 2020-09-15 (×4): 237 mL via ORAL

## 2020-09-14 NOTE — Progress Notes (Signed)
Nutrition Follow-up  DOCUMENTATION CODES:   Not applicable  INTERVENTION:  Ensure Enlive po TID, each supplement provides 350 kcal and 20 grams of protein   NUTRITION DIAGNOSIS:   Inadequate oral intake related to inability to eat as evidenced by NPO status. --Progressing, pt now on dysphagia 2 diet with thin liquids   GOAL:   Patient will meet greater than or equal to 90% of their needs -- progressing   MONITOR:   Skin, TF tolerance, Weight trends, Labs, I & O's, Diet advancement  REASON FOR ASSESSMENT:   Consult Enteral/tube feeding initiation and management  ASSESSMENT:   54 year old male with history of hypertension, polysubstance abuse (ETOH, cocaine) presents with left-sided weakness. 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  9/15 Cortrak (gastric) 9/17 MBS, dysphagia 2 diet with thin liquids ordered; Cortrak removed  Pt pending discharge to CIR.   PO Intake: 25-75% x8 recorded meals (53% average meal intake)  Labs reviewed. Medications: Folvite, MVI, Protonix, Senokot-S, Thiamine     Diet Order:   Diet Order            DIET DYS 2 Room service appropriate? Yes with Assist; Fluid consistency: Thin  Diet effective now                 EDUCATION NEEDS:   Not appropriate for education at this time  Skin:  Skin Assessment: Reviewed RN Assessment  Last BM:  9/14  Height:   Ht Readings from Last 1 Encounters:  09/11/20 5\' 7"  (1.702 m)    Weight:   Wt Readings from Last 1 Encounters:  09/13/20 77.7 kg    BMI:  Body mass index is 26.83 kg/m.  Estimated Nutritional Needs:   Kcal:  2050-2250  Protein:  105-115 grams  Fluid:  >/= 2 L/day    07-27-1979, MS, RD, LDN RD pager number and weekend/on-call pager number located in Amion.

## 2020-09-14 NOTE — Progress Notes (Signed)
Physical Therapy Treatment Patient Details Name: Zachary Flores MRN: [id number] DOB: February 24, 1966 Today's Date: 09/14/2020    History of Present Illness Pt is a 54 yo male presenting with L sided numbness/weakness and dysarthria. CT revealed a pontine hemorrhage. PMH includes: polysubstance abuse (alcohol, cocaine), TIA, CVA, HTN    PT Comments    Patient observed in bed with strong rt lateral flexion of trunk and neck, LUE almost choreatic movements of LUE, and extensor tone of LLE. Session focused on reducing tone in LLE to allow stretching/PROM and attempt isolated movements (could elicit weak knee flexion in left sidelying once extensor tone reduced). During session, worked on bed mobility and attempting to get pt to functionally use LUE to assist with rolling (pt unable to control movements or grasp rail with LUE). At end of session, repositioned into left sidelying to promote cervical left lateral flexion and reduce tone in left extremities. Patient quickly falling asleep once settled in this position.    Follow Up Recommendations  CIR     Equipment Recommendations  Other (comment) (defer)    Recommendations for Other Services       Precautions / Restrictions Precautions Precautions: Fall Precaution Comments:  keep SBP 120-140    Mobility  Bed Mobility Overal bed mobility: Needs Assistance Bed Mobility: Rolling Rolling: Mod assist;Max assist (to each side x 2)         General bed mobility comments: pt with inability to grasp rail with LUE (even with placing hand on rail); some difficulty grasping with rt hand when rolling to left; difficulty flexion LLE for rolling (pushes into extension); able to fully flex LLE in left sidelying  Transfers                 General transfer comment: deferred due to no +2 assist   Ambulation/Gait             General Gait Details: unable   Stairs             Wheelchair Mobility    Modified Rankin (Stroke  Patients Only) Modified Rankin (Stroke Patients Only) Pre-Morbid Rankin Score: No significant disability Modified Rankin: Severe disability     Balance                                            Cognition Arousal/Alertness: Awake/alert Behavior During Therapy: Restless Overall Cognitive Status: No family/caregiver present to determine baseline cognitive functioning Area of Impairment: Attention;Following commands;Safety/judgement;Problem solving;Awareness                   Current Attention Level: Focused;Sustained   Following Commands: Follows one step commands with increased time;Follows one step commands inconsistently Safety/Judgement: Decreased awareness of deficits;Decreased awareness of safety Awareness: Intellectual Problem Solving: Slow processing;Decreased initiation;Difficulty sequencing;Requires verbal cues;Requires tactile cues General Comments: Constant random movements of LUE (?alien arm) that settled down in left sidelying; this was distracting for pt      Exercises Other Exercises Other Exercises: PROM LLE (supine and sidelying); left elbow extension (PROM improved in left sidelying    General Comments General comments (skin integrity, edema, etc.): Patient describes double-vision in his left field of vision (and dysconjugate gaze observed when looking left); denies changes in right field of vision and conjugate gaze noted      Pertinent Vitals/Pain Pain Assessment: No/denies pain Faces Pain Scale: No hurt  Home Living   Living Arrangements: Other relatives (brother) Available Help at Discharge: Family;Available 24 hours/day Type of Home: Apartment Home Access: Level entry   Home Layout: One level        Prior Function            PT Goals (current goals can now be found in the care plan section) Acute Rehab PT Goals Patient Stated Goal: Agreeable to therapy Time For Goal Achievement: 09/22/20 Potential to Achieve  Goals: Fair Progress towards PT goals: Progressing toward goals    Frequency    Min 4X/week      PT Plan Current plan remains appropriate    Co-evaluation              AM-PAC PT "6 Clicks" Mobility   Outcome Measure  Help needed turning from your back to your side while in a flat bed without using bedrails?: A Lot Help needed moving from lying on your back to sitting on the side of a flat bed without using bedrails?: A Lot Help needed moving to and from a bed to a chair (including a wheelchair)?: Total Help needed standing up from a chair using your arms (e.g., wheelchair or bedside chair)?: A Lot Help needed to walk in hospital room?: Total Help needed climbing 3-5 steps with a railing? : Total 6 Click Score: 9    End of Session   Activity Tolerance: Patient tolerated treatment well Patient left: in bed;with call bell/phone within reach;with bed alarm set;with SCD's reapplied Nurse Communication: Other (comment) (Left extremities relax in left sidelying--prefer for sleepin) PT Visit Diagnosis: Other abnormalities of gait and mobility (R26.89);Other symptoms and signs involving the nervous system (R29.898);Hemiplegia and hemiparesis Hemiplegia - Right/Left: Left Hemiplegia - dominant/non-dominant: Non-dominant Hemiplegia - caused by: Nontraumatic intracerebral hemorrhage     Time: 2947-6546 PT Time Calculation (min) (ACUTE ONLY): 31 min  Charges:  $Therapeutic Activity: 8-22 mins $Neuromuscular Re-education: 8-22 mins                      Jerolyn Center, PT Pager [phone]    [name] 09/14/2020, 5:32 PM

## 2020-09-14 NOTE — PMR Pre-admission (Addendum)
PMR Admission Coordinator Pre-Admission Assessment  Patient: Zachary Flores is an 54 y.o., male MRN: [id number] DOB: Nov 14, 1966 Height: '5\' 7"'  (170.2 cm) Weight: 77.7 kg              Insurance Information HMO:     PPO:      PCP:      IPA:      80/20:      OTHER:  PRIMARY: Uninsured      Policy#:       Subscriber:  CM Name:       Phone#:      Fax#:  Pre-Cert#:       Employer:  Benefits:  Phone #:      Name:  Eff. Date:      Deduct:       Out of Pocket Max:       Life Max:   CIR:       SNF:  Outpatient:      Co-Pay:  Home Health:       Co-Pay:  DME:      Co-Pay:  Providers:  SECONDARY:       Policy#:       Phone#:   Development worker, community:       Phone#:   The Actuary for patients in Inpatient Rehabilitation Facilities with attached Privacy Act [place] Records was provided and verbally reviewed with: N/A  Emergency Contact Information Contact Information     Name Relation Home Work [place]  [phone]     Hensen,Walter Brother [phone]     Locus Raymel, Cull Sister [phone]  [phone]   Savyon, Loken [phone]        Current Medical History  Patient Admitting Diagnosis: ICH  History of Present Illness: Zachary Flores is a 54 year old right-handed male with history of hypertension,  as well as alcohol/tobacco use.  Per chart review lives with brother.  Works in a Environmental consultant.  He uses a straight cane.  Presented 09/06/2020 left side weakness and slurred speech as well as double vision.  Blood pressure 150/99.  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  Severe chronic small vessel ischemic disease.  Admission chemistries glucose 188 SARS coronavirus negative urine drug screen positive cocaine.  Neurology follow-up conservative care monitoring of blood pressure.  Latest echocardiogram with ejection fraction of 55 to 60% without  emboli.  Patient did have episodes of agitation restlessness needing Precedex as well as CIWA protocol and currently maintained on Seroquel.  Initially with nasogastric tube for nutritional support his diet has been advanced to a dysphagia #2 thin liquid.  Therapy evaluations completed and patient was admitted for a comprehensive rehab program.  Complete NIHSS TOTAL: 8  Past Medical History  History reviewed. No pertinent past medical history.  Family History  Family history is unknown by patient.  Prior Rehab/Hospitalizations:  Has the patient had prior rehab or hospitalizations prior to admission? Yes  Has the patient had major surgery during 100 days prior to admission? No  Current Medications   Current Facility-Administered Medications:    acetaminophen (TYLENOL) tablet 650 mg, 650 mg, Oral, Q4H PRN, 650 mg at 09/13/20 2157 **OR** acetaminophen (TYLENOL) suppository 650 mg, 650 mg, Rectal, Q4H PRN, Annita Brod, MD   chlorhexidine (PERIDEX) 0.12 % solution 15 mL, 15 mL, Mouth Rinse, BID, Biby, Sharon L, NP, 15 mL at 09/15/20 0851   Chlorhexidine Gluconate  Cloth 2 % PADS 6 each, 6 each, Topical, Daily, Biby, Sharon L, NP, 6 each at 09/15/20 0851   feeding supplement (ENSURE ENLIVE) (ENSURE ENLIVE) liquid 237 mL, 237 mL, Oral, TID BM, Bonnielee Haff, MD, 237 mL at 34/19/62 2297   folic acid (FOLVITE) tablet 1 mg, 1 mg, Oral, Daily, Annita Brod, MD, 1 mg at 09/15/20 9892   influenza vac split quadrivalent PF (FLUARIX) injection 0.5 mL, 0.5 mL, Intramuscular, Tomorrow-1000, [place] Pink, Trenton Gammon, MD   labetalol (NORMODYNE) injection 10-40 mg, 10-40 mg, Intravenous, Q10 min PRN, Miachel Roux, Sharon L, NP   lisinopril (ZESTRIL) tablet 5 mg, 5 mg, Oral, Daily, Annita Brod, MD, 5 mg at 09/15/20 1194   MEDLINE mouth rinse, 15 mL, Mouth Rinse, q12n4p, Biby, Sharon L, NP, 15 mL at 09/14/20 1543   metoprolol tartrate (LOPRESSOR) tablet 25 mg, 25 mg, Oral, BID, Annita Brod, MD, 25  mg at 09/15/20 1740   multivitamin with minerals tablet 1 tablet, 1 tablet, Oral, Daily, Annita Brod, MD, 1 tablet at 09/15/20 0852   pantoprazole (PROTONIX) EC tablet 40 mg, 40 mg, Oral, Daily, Gevena Barre K, MD, 40 mg at 09/15/20 8144   pneumococcal 23 valent vaccine (PNEUMOVAX-23) injection 0.5 mL, 0.5 mL, Intramuscular, Tomorrow-1000, [place] Pink, Sendil K, MD   QUEtiapine (SEROQUEL) tablet 25 mg, 25 mg, Oral, QHS, Gevena Barre K, MD, 25 mg at 09/14/20 2220   senna-docusate (Senokot-S) tablet 1 tablet, 1 tablet, Oral, BID, Annita Brod, MD, 1 tablet at 09/15/20 8185   thiamine tablet 100 mg, 100 mg, Oral, Daily, 100 mg at 09/15/20 6314 **OR** [DISCONTINUED] thiamine (B-1) injection 100 mg, 100 mg, Intravenous, Daily, Aroor, Lanice Schwab, MD, 100 mg at 09/08/20 9702  Patients Current Diet:  Diet Order             DIET DYS 2 Room service appropriate? 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  Independent  Indoor Mobility: Did the patient need assistance with walking from room to room (with or without device)? Independent  Stairs: Did the patient need assistance with internal or external stairs (with or without device)? Independent  Functional Cognition: Did the patient need help planning regular tasks such as shopping or  remembering to take medications? Independent  Home Assistive Devices / Equipment Home Assistive Devices/Equipment: None Home Equipment: Cane - single point  Prior Device Use: Indicate devices/aids used by the patient prior to current illness, exacerbation or injury?  cane  Current Functional Level Cognition  Arousal/Alertness:  (fluctuating) Overall Cognitive Status: No family/caregiver present to determine baseline cognitive functioning Current Attention Level: Focused, Sustained Orientation Level: Oriented to person, Oriented to place, Oriented to time, Disoriented to situation Following Commands: Follows one step commands with increased time, Follows one step commands inconsistently Safety/Judgement: Decreased awareness of deficits,  Decreased awareness of safety General Comments: Constant random movements of LUE (?alien arm) that settled down in left sidelying; this was distracting for pt Attention: Focused, Sustained Focused Attention: Impaired Focused Attention Impairment: Functional basic Sustained Attention: Impaired Sustained Attention Impairment: Verbal basic, Functional basic Memory: Impaired Memory Impairment: Storage deficit, Retrieval deficit, Decreased recall of new information, Decreased short term memory Decreased Short Term Memory: Verbal basic Awareness: Impaired Awareness Impairment: Emergent impairment Problem Solving: Impaired Problem Solving Impairment: Functional basic, Verbal basic Safety/Judgment: Impaired    Extremity Assessment (includes Sensation/Coordination)  Upper Extremity Assessment: Difficult to assess due to impaired cognition, Generalized weakness, LUE deficits/detail LUE Deficits / Details: LUE noted grossly weaker than RUE, though does attempt to utilize for purposeful movements  LUE Coordination: decreased fine motor, decreased gross motor  Lower Extremity Assessment: Defer to PT evaluation LLE Deficits / Details: did not extend L LE in  sitting, but in supine able to flex at hip/knee and moved L ankle, but limited compared to R difficult to fully assess due to cognition    ADLs  Overall ADL's : Needs assistance/impaired Eating/Feeding: NPO Grooming: Maximal assistance, Sitting, Wash/dry face Grooming Details (indicate cue type and reason): significant assist for sitting balance  EOB (maxA) with cues to initiate and perform basic ADL task General ADL Comments: pt requiring totalA for additional ADL at this time     Mobility  Overal bed mobility: Needs Assistance Bed Mobility: Rolling Rolling: Mod assist, Max assist (to each side x 2) Sidelying to sit: Max assist, HOB elevated, +2 for physical assistance Supine to sit: Max assist, +2 for physical assistance Sit to supine: Max assist, +2 for physical assistance Sit to sidelying: Mod assist, +2 for physical assistance General bed mobility comments: pt with inability to grasp rail with LUE (even with placing hand on rail); some difficulty grasping with rt hand when rolling to left; difficulty flexion LLE for rolling (pushes into extension); able to fully flex LLE in left sidelying    Transfers  Overall transfer level: Needs assistance Equipment used: 2 person hand held assist Transfers: Sit to/from Stand Sit to Stand: Mod assist General transfer comment: deferred due to no +2 assist     Ambulation / Gait / Stairs / Wheelchair Mobility  Ambulation/Gait General Gait Details: unable    Posture / Balance Dynamic Sitting Balance Sitting balance - Comments: able to progress from forward flexed position with max input/support via blanket around hips/torso, to upright sitting without needing blanket with min assist; pt able to shift forward into flexion if cued to look at his socks and prevent posterior lean Balance Overall balance assessment: Needs assistance Sitting-balance support: Feet supported, Bilateral upper extremity supported Sitting balance-Leahy Scale:  Poor Sitting balance - Comments: able to progress from forward flexed position with max input/support via blanket around hips/torso, to upright sitting without needing blanket with min assist; pt able to shift forward into flexion if cued to look at his socks and prevent posterior lean Postural control: Posterior lean, Right lateral lean Standing balance support: Bilateral upper extremity supported Standing balance-Leahy Scale: Poor    Special needs/care consideration Behavioral consideration has been restless and Designated visitor [place] (from acute therapy documentation) Living Arrangements: Other relatives (brother)  Lives With: Family Available Help at Discharge: Family, Available 24 hours/day Type of Home: Apartment Home Layout: One level Home Access: Level entry Bathroom Shower/Tub: Chiropodist: Standard Bathroom Accessibility: Yes How Accessible: Accessible via walker Home  Care Services: No Additional Comments: Patient unable to give details about home set up; does report he used a cane PTA  Discharge Living Setting Plans for Discharge Living Setting: Lives with (comment) (brother) Type of Home at Discharge: Apartment Discharge Home Layout: One level Discharge Home Access: Level entry Discharge Bathroom Shower/Tub: [place] unit Discharge Bathroom Toilet: Standard Discharge Bathroom Accessibility: Yes How Accessible: Accessible via walker Does the patient have any problems obtaining your medications?: Yes (Describe) (uninsured)  Social/Family/Support Systems Anticipated Caregiver: Sister Contractor and Valentina Gu), brothers Reyli Schroth and Leighton Ruff) Anticipated Caregiver's Contact Information: Marnette Burgess [phone]; Georga Kaufmann [phone] Jew [phone]; Mallie Mussel [phone] Ability/Limitations of Caregiver: min assist Caregiver Availability: 24/7 Discharge Plan Discussed with Primary  Caregiver: Yes Is Caregiver In Agreement with Plan?: Yes Does Caregiver/Family have Issues with Lodging/Transportation while Pt is in Rehab?: No   Goals Patient/Family Goal for Rehab: PT/OT Min A, SLP min assist Expected length of stay: 16-18 days Pt/Family Agrees to Admission and willing to participate: Yes Program Orientation Provided & Reviewed with Pt/Caregiver Including Roles  & Responsibilities: Yes  Barriers to Discharge: Insurance for SNF coverage   Decrease burden of Care through IP rehab admission: n/a   Possible need for SNF placement upon discharge: not anticipated   Patient Condition: I have reviewed medical records from [facility], spoken with CM, and patient and family member. I met with patient at the bedside and discussed via phone for inpatient rehabilitation assessment.  Patient will benefit from ongoing PT, OT and SLP, can actively participate in 3 hours of therapy a day 5 days of the week, and can make measurable gains during the admission.  Patient will also benefit from the coordinated team approach during an Inpatient Acute Rehabilitation admission.  The patient will receive intensive therapy as well as Rehabilitation physician, nursing, social worker, and care management interventions.  Due to safety, skin/wound care, disease management, medication administration, pain management and patient education the patient requires 24 hour a day rehabilitation nursing.  The patient is currently mod assist +2 with mobility and basic ADLs.  Discharge setting and therapy post discharge at  home  is anticipated.  Patient has agreed to participate in the Acute Inpatient Rehabilitation Program and will admit today.  Preadmission Screen Completed By:  Michel Santee, PT, DPT 09/15/2020 10:27 AM ______________________________________________________________________   Discussed status with Dr. Posey Pronto on 09/15/20 at 10:27 AM  and received approval for admission  today.  Admission Coordinator:  Michel Santee, PT, DPT time 10:27 AM Sudie Grumbling 09/15/20

## 2020-09-14 NOTE — Progress Notes (Signed)
PROGRESS NOTE    Zachary Flores  IRS:[id number] DOB: 1966-09-19 DOA: 09/06/2020 PCP: Patient, No Pcp Per   Chief Complaint  Patient presents with  . Stroke Symptoms   Brief Narrative: As per hpi:54 year old with hypertension supposed to be on lisinopril 5 mg metoprolol 25 twice daily , cocaine abuse presented with presented to the ED on 09/06/2020 w/ double vision starting that day and found to have left-sided weakness.  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 Patient had been agitated tachypneic diaphoretic needing Precedex drip CIWA Ativan and on intermittent antihypertensive drip.  Following weaning off of drip, transferred to hospitalist service on 9/16.  Currently being considered for inpatient rehab.  Subjective: Patient states that the pain in his left arm is better.  Denies any other complaints.     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   Patient also had a cortrack feeding tube which was removed after he did well on a modified barium swallow.  SLP continues to follow.  Diet being advanced slowly.  Currently on a dysphagia 2 diet. Seems to be stable from a neurological standpoint.  Delirium/acute toxic metabolic encephalopathy/alcohol withdrawal syndrome with complication  Initially on Precedex.  Also received Haldol as needed.  Was started on low-dose Seroquel.  Seen and cleared by psychiatry as well.  Continue folic acid multivitamins and thiamine. From a behavioral standpoint he is stable.  Tolerating his medications well.    Essential hypertension Blood pressure is reasonably well controlled.  Continue lisinopril and  metoprolol.  Creatinine was normal on 9/19.  Left arm pain Probably musculoskeletal or related to his deficits from stroke.  Pain has improved with analgesic agents.  Improved mobility noted today.  Cocaine abuse  Has been counseled.  Urine retention Patient had a coud catheter which is in place.  Continue Flomax.  Voiding trial once he is more mobile.  DVT prophylaxis: SCDs Code Status:   Code Status: Full Code  Family Communication: No family at bedside.  Status is: Inpatient Remains inpatient appropriate because: Unsafe discharge plan  Dispo: The patient is from: Home              Anticipated d/c is to: CIR              Anticipated d/c date is: Whenever a bed becomes available.              Patient currently is medically stable for discharge    Medications: Scheduled Meds: . chlorhexidine  15 mL Mouth Rinse BID  . Chlorhexidine Gluconate Cloth  6 each Topical Daily  . folic acid  1 mg Oral Daily  . influenza vac split quadrivalent PF  0.5 mL Intramuscular Tomorrow-1000  . lisinopril  5 mg Oral Daily  . mouth rinse  15 mL Mouth Rinse q12n4p  . metoprolol tartrate  25 mg Oral BID  . multivitamin with minerals  1 tablet Oral Daily  . pantoprazole  40 mg Oral Daily  . pneumococcal 23 valent vaccine  0.5 mL Intramuscular Tomorrow-1000  . QUEtiapine  25 mg Oral QHS  . senna-docusate  1 tablet Oral BID  . thiamine  100 mg Oral  Daily   Continuous Infusions:   Antimicrobials: Anti-infectives (From admission, onward)   None     Objective: Vitals: Today's Vitals   09/13/20 2349 09/14/20 0349 09/14/20 0813 09/14/20 0955  BP: 123/66 99/69  115/89  Pulse: 63 (!) 59    Resp: 18 18  20   Temp: 98 F (36.7 C) 97.7 F (36.5 C)  97.6 F (36.4 C)  TempSrc: Oral Oral  Oral  SpO2: 100% 96%  97%  Weight:      Height:      PainSc:   0-No pain     Intake/Output Summary (Last 24 hours) at 09/14/2020 1038 Last data filed at 09/14/2020 0900 Gross per 24 hour  Intake 697 ml    Output 900 ml  Net -203 ml   Filed Weights   09/06/20 2300 09/10/20 0420 09/13/20 0423  Weight: 87 kg 78.7 kg 77.7 kg   Weight change:   Intake/Output from previous day: 09/20 0701 - 09/21 0700 In: 697 [P.O.:697] Out: 575 [Urine:575] Intake/Output this shift: Total I/O In: 360 [P.O.:360] Out: 325 [Urine:325]   Examination:  General appearance: Awake alert.  In no distress Resp: Clear to auscultation bilaterally.  Normal effort Cardio: S1-S2 is normal regular.  No S3-S4.  No rubs murmurs or bruit GI: Abdomen is soft.  Nontender nondistended.  Bowel sounds are present normal.  No masses organomegaly Extremities: Able to move his left arm better today than yesterday.  No swelling noted in any of the joints.  Good pulses. Neurologic: Left-sided weakness as before.     Data Reviewed: I have personally reviewed following labs and imaging studies CBC: Recent Labs  Lab 09/12/20 0726  WBC 5.8  HGB 15.1  HCT 48.1  MCV 92.5  PLT 405*   Basic Metabolic Panel: Recent Labs  Lab 09/12/20 0726  NA 143  K 3.8  CL 104  CO2 27  GLUCOSE 112*  BUN 17  CREATININE 0.97  CALCIUM 9.7  MG 2.4   GFR: Estimated Creatinine Clearance: 79.5 mL/min (by C-G formula based on SCr of 0.97 mg/dL). Liver Function Tests: Recent Labs  Lab 09/12/20 0726  AST 29  ALT 26  ALKPHOS 68  BILITOT 0.8  PROT 7.3  ALBUMIN 3.4*   CBG: Recent Labs  Lab 09/12/20 2046 09/13/20 0603 09/13/20 1613 09/13/20 2347 09/14/20 0612  GLUCAP 88 77 77 106* 87     Recent Results (from the past 240 hour(s))  SARS Coronavirus 2 by RT PCR (hospital order, performed in [facility] hospital lab) Nasopharyngeal Nasopharyngeal Swab     Status: None   Collection Time: 09/06/20 11:08 AM   Specimen: Nasopharyngeal Swab  Result Value Ref Range Status   SARS Coronavirus 2 NEGATIVE NEGATIVE Final    Comment: (NOTE) SARS-CoV-2 target nucleic acids are NOT DETECTED.  The SARS-CoV-2 RNA is generally  detectable in upper and lower respiratory specimens during the acute phase of infection. The lowest concentration of SARS-CoV-2 viral copies this assay can detect is 250 copies / mL. A negative result does not preclude SARS-CoV-2 infection and should not be used as the sole basis for treatment or other patient management decisions.  A negative result may occur with improper specimen collection / handling, submission of specimen other than nasopharyngeal swab, presence of viral mutation(s) within the areas targeted by this assay, and inadequate number of viral copies (<250 copies / mL). A negative result must be combined with clinical observations, patient history, and epidemiological information.  Fact Sheet for Patients:  BoilerBrush.com.cy  Fact Sheet for Healthcare Providers: https://pope.com/  This test is not yet approved or  cleared by the [place] FDA and has been authorized for detection and/or diagnosis of SARS-CoV-2 by FDA under an Emergency Use Authorization (EUA).  This EUA will remain in effect (meaning this test can be used) for the duration of the COVID-19 declaration under Section 564(b)(1) of the Act, 21 U.S.C. section 360bbb-3(b)(1), unless the authorization is terminated or revoked sooner.  Performed at [facility], [address], [place], [place] [address]   MRSA PCR Screening     Status: None   Collection Time: 09/09/20  6:00 AM  Result Value Ref Range Status   MRSA by PCR NEGATIVE NEGATIVE Final    Comment:        The GeneXpert MRSA Assay (FDA approved for NASAL specimens only), is one component of a comprehensive MRSA colonization surveillance program. It is not intended to diagnose MRSA infection nor to guide or monitor treatment for MRSA infections. Performed at [facility], [address] [place], [place] [address]      Radiology Studies: No results found.   LOS: 8 days    Osvaldo Shipper, MD [facility]  09/14/2020, 10:38 AM

## 2020-09-14 NOTE — Progress Notes (Signed)
Inpatient Rehab Admissions Coordinator:   I have no beds available for this patient to admit to CIR today.  Will continue to follow for timing of potential admission pending bed availability.   Davon Abdelaziz, PT, DPT Admissions Coordinator [phone] 09/14/20  2:03 PM    

## 2020-09-15 ENCOUNTER — Encounter (HOSPITAL_COMMUNITY): Payer: Self-pay | Admitting: Physical Medicine & Rehabilitation

## 2020-09-15 ENCOUNTER — Inpatient Hospital Stay (HOSPITAL_COMMUNITY)
Admission: RE | Admit: 2020-09-15 | Discharge: 2020-11-11 | DRG: 057 | Disposition: A | Discharge status: Home Health | Payer: Medicaid Other | Source: Ambulatory Visit | Attending: Physical Medicine & Rehabilitation | Admitting: Physical Medicine & Rehabilitation

## 2020-09-15 ENCOUNTER — Other Ambulatory Visit: Payer: Self-pay

## 2020-09-15 DIAGNOSIS — R1310 Dysphagia, unspecified: Secondary | ICD-10-CM | POA: Diagnosis present

## 2020-09-15 DIAGNOSIS — I69198 Other sequelae of nontraumatic intracerebral hemorrhage: Secondary | ICD-10-CM | POA: Diagnosis not present

## 2020-09-15 DIAGNOSIS — I619 Nontraumatic intracerebral hemorrhage, unspecified: Secondary | ICD-10-CM

## 2020-09-15 DIAGNOSIS — I613 Nontraumatic intracerebral hemorrhage in brain stem: Secondary | ICD-10-CM | POA: Diagnosis not present

## 2020-09-15 DIAGNOSIS — R030 Elevated blood-pressure reading, without diagnosis of hypertension: Secondary | ICD-10-CM | POA: Diagnosis not present

## 2020-09-15 DIAGNOSIS — F1410 Cocaine abuse, uncomplicated: Secondary | ICD-10-CM | POA: Diagnosis present

## 2020-09-15 DIAGNOSIS — R4781 Slurred speech: Secondary | ICD-10-CM | POA: Diagnosis present

## 2020-09-15 DIAGNOSIS — I69154 Hemiplegia and hemiparesis following nontraumatic intracerebral hemorrhage affecting left non-dominant side: Principal | ICD-10-CM

## 2020-09-15 DIAGNOSIS — H532 Diplopia: Secondary | ICD-10-CM | POA: Diagnosis present

## 2020-09-15 DIAGNOSIS — F17210 Nicotine dependence, cigarettes, uncomplicated: Secondary | ICD-10-CM | POA: Diagnosis present

## 2020-09-15 DIAGNOSIS — R0989 Other specified symptoms and signs involving the circulatory and respiratory systems: Secondary | ICD-10-CM | POA: Diagnosis not present

## 2020-09-15 DIAGNOSIS — I952 Hypotension due to drugs: Secondary | ICD-10-CM | POA: Diagnosis not present

## 2020-09-15 DIAGNOSIS — L89322 Pressure ulcer of left buttock, stage 2: Secondary | ICD-10-CM | POA: Diagnosis not present

## 2020-09-15 DIAGNOSIS — F1910 Other psychoactive substance abuse, uncomplicated: Secondary | ICD-10-CM

## 2020-09-15 DIAGNOSIS — A084 Viral intestinal infection, unspecified: Secondary | ICD-10-CM | POA: Diagnosis not present

## 2020-09-15 DIAGNOSIS — Z20822 Contact with and (suspected) exposure to covid-19: Secondary | ICD-10-CM | POA: Diagnosis present

## 2020-09-15 DIAGNOSIS — R32 Unspecified urinary incontinence: Secondary | ICD-10-CM | POA: Diagnosis not present

## 2020-09-15 DIAGNOSIS — I959 Hypotension, unspecified: Secondary | ICD-10-CM

## 2020-09-15 DIAGNOSIS — R471 Dysarthria and anarthria: Secondary | ICD-10-CM | POA: Diagnosis not present

## 2020-09-15 DIAGNOSIS — R001 Bradycardia, unspecified: Secondary | ICD-10-CM | POA: Diagnosis not present

## 2020-09-15 DIAGNOSIS — I6350 Cerebral infarction due to unspecified occlusion or stenosis of unspecified cerebral artery: Secondary | ICD-10-CM | POA: Diagnosis not present

## 2020-09-15 DIAGNOSIS — L89302 Pressure ulcer of unspecified buttock, stage 2: Secondary | ICD-10-CM | POA: Diagnosis not present

## 2020-09-15 DIAGNOSIS — R109 Unspecified abdominal pain: Secondary | ICD-10-CM

## 2020-09-15 DIAGNOSIS — K5901 Slow transit constipation: Secondary | ICD-10-CM | POA: Diagnosis not present

## 2020-09-15 DIAGNOSIS — I69122 Dysarthria following nontraumatic intracerebral hemorrhage: Secondary | ICD-10-CM | POA: Diagnosis not present

## 2020-09-15 DIAGNOSIS — L8990 Pressure ulcer of unspecified site, unspecified stage: Secondary | ICD-10-CM | POA: Insufficient documentation

## 2020-09-15 DIAGNOSIS — I69354 Hemiplegia and hemiparesis following cerebral infarction affecting left non-dominant side: Secondary | ICD-10-CM

## 2020-09-15 DIAGNOSIS — D75839 Thrombocytosis, unspecified: Secondary | ICD-10-CM | POA: Diagnosis not present

## 2020-09-15 DIAGNOSIS — I69191 Dysphagia following nontraumatic intracerebral hemorrhage: Secondary | ICD-10-CM | POA: Diagnosis not present

## 2020-09-15 DIAGNOSIS — R4587 Impulsiveness: Secondary | ICD-10-CM | POA: Diagnosis not present

## 2020-09-15 DIAGNOSIS — I69119 Unspecified symptoms and signs involving cognitive functions following nontraumatic intracerebral hemorrhage: Secondary | ICD-10-CM

## 2020-09-15 DIAGNOSIS — I69391 Dysphagia following cerebral infarction: Secondary | ICD-10-CM

## 2020-09-15 DIAGNOSIS — A020 Salmonella enteritis: Secondary | ICD-10-CM | POA: Diagnosis not present

## 2020-09-15 DIAGNOSIS — I10 Essential (primary) hypertension: Secondary | ICD-10-CM | POA: Diagnosis present

## 2020-09-15 DIAGNOSIS — Z8679 Personal history of other diseases of the circulatory system: Secondary | ICD-10-CM

## 2020-09-15 DIAGNOSIS — R0682 Tachypnea, not elsewhere classified: Secondary | ICD-10-CM

## 2020-09-15 DIAGNOSIS — R339 Retention of urine, unspecified: Secondary | ICD-10-CM | POA: Diagnosis not present

## 2020-09-15 DIAGNOSIS — R531 Weakness: Secondary | ICD-10-CM

## 2020-09-15 DIAGNOSIS — G441 Vascular headache, not elsewhere classified: Secondary | ICD-10-CM

## 2020-09-15 LAB — BASIC METABOLIC PANEL
Anion gap: 10 (ref 5–15)
BUN: 17 mg/dL (ref 6–20)
CO2: 26 mmol/L (ref 22–32)
Calcium: 9.6 mg/dL (ref 8.9–10.3)
Chloride: 103 mmol/L (ref 98–111)
Creatinine, Ser: 0.97 mg/dL (ref 0.61–1.24)
GFR calc Af Amer: >60 mL/min (ref >60)
GFR calc non Af Amer: >60 mL/min (ref >60)
Glucose, Bld: 102 mg/dL — ABNORMAL HIGH (ref 70–99)
Potassium: 4.5 mmol/L (ref 3.5–5.1)
Sodium: 139 mmol/L (ref 135–145)

## 2020-09-15 LAB — GLUCOSE, CAPILLARY
Glucose-Capillary: 84 mg/dL (ref 70–99)
Glucose-Capillary: 93 mg/dL (ref 70–99)

## 2020-09-15 MED ORDER — ENSURE ENLIVE PO LIQD
237.0000 mL | Freq: Three times a day (TID) | ORAL | Status: DC
  Administered 2020-09-15 – 2020-11-07 (×132): 237 mL via ORAL
  Filled 2020-09-15 (×11): qty 237

## 2020-09-15 MED ORDER — METOPROLOL TARTRATE 25 MG PO TABS
25.0000 mg | ORAL_TABLET | Freq: Two times a day (BID) | ORAL | Status: DC
  Administered 2020-09-15 – 2020-09-21 (×12): 25 mg via ORAL
  Filled 2020-09-15 (×13): qty 1

## 2020-09-15 MED ORDER — ADULT MULTIVITAMIN W/MINERALS CH
1.0000 | ORAL_TABLET | Freq: Every day | ORAL | Status: DC
  Administered 2020-09-16 – 2020-11-11 (×57): 1 via ORAL
  Filled 2020-09-15 (×57): qty 1

## 2020-09-15 MED ORDER — SORBITOL 70 % SOLN
30.0000 mL | Freq: Every day | Status: DC | PRN
  Administered 2020-09-15 – 2020-10-16 (×6): 30 mL via ORAL
  Filled 2020-09-15 (×6): qty 30

## 2020-09-15 MED ORDER — LISINOPRIL 5 MG PO TABS
5.0000 mg | ORAL_TABLET | Freq: Every day | ORAL | Status: DC
  Administered 2020-09-16: 5 mg via ORAL
  Filled 2020-09-15: qty 1

## 2020-09-15 MED ORDER — SENNOSIDES-DOCUSATE SODIUM 8.6-50 MG PO TABS
1.0000 | ORAL_TABLET | Freq: Two times a day (BID) | ORAL | Status: DC
  Administered 2020-09-15 – 2020-10-17 (×62): 1 via ORAL
  Filled 2020-09-15 (×64): qty 1

## 2020-09-15 MED ORDER — FOLIC ACID 1 MG PO TABS
1.0000 mg | ORAL_TABLET | Freq: Every day | ORAL | Status: DC
  Administered 2020-09-16 – 2020-11-11 (×57): 1 mg via ORAL
  Filled 2020-09-15 (×57): qty 1

## 2020-09-15 MED ORDER — QUETIAPINE FUMARATE 25 MG PO TABS
25.0000 mg | ORAL_TABLET | Freq: Every day | ORAL | Status: DC
  Administered 2020-09-15 – 2020-09-16 (×2): 25 mg via ORAL
  Filled 2020-09-15 (×2): qty 1

## 2020-09-15 MED ORDER — ACETAMINOPHEN 650 MG RE SUPP: 650.0000 mg | RECTAL | Status: DC | PRN

## 2020-09-15 MED ORDER — ACETAMINOPHEN 325 MG PO TABS
650.0000 mg | ORAL_TABLET | ORAL | Status: DC | PRN
  Administered 2020-09-21 – 2020-10-30 (×4): 650 mg via ORAL
  Filled 2020-09-15 (×4): qty 2

## 2020-09-15 MED ORDER — THIAMINE HCL 100 MG PO TABS
100.0000 mg | ORAL_TABLET | Freq: Every day | ORAL | Status: DC
  Administered 2020-09-16 – 2020-11-11 (×57): 100 mg via ORAL
  Filled 2020-09-15 (×57): qty 1

## 2020-09-15 MED ORDER — PANTOPRAZOLE SODIUM 40 MG PO TBEC
40.0000 mg | DELAYED_RELEASE_TABLET | Freq: Every day | ORAL | Status: DC
  Administered 2020-09-16 – 2020-11-11 (×57): 40 mg via ORAL
  Filled 2020-09-15 (×57): qty 1

## 2020-09-15 NOTE — Progress Notes (Signed)
Physical Therapy Treatment Patient Details Name: Zachary Flores MRN: [id number] DOB: 21-Sep-1966 Today's Date: 09/15/2020    History of Present Illness Pt is a 54 yo male presenting with L sided numbness/weakness and dysarthria. CT revealed a pontine hemorrhage. PMH includes: polysubstance abuse (alcohol, cocaine), TIA, CVA, HTN    PT Comments    Patient continues to improve with less delay to requests and requiring less physical assist to complete mobility. Able to use stedy for repeat sit to stands, however pt continues with retropulsion despite attempts to have pt lean forward to "push therapist away" while coming to stand. Any assist on posterior aspect of pt's body increases his retropulsion. Holding onto bar, pt could stand with min assist however in significant posterior lean.     Follow Up Recommendations  CIR     Equipment Recommendations  Other (comment) (defer)    Recommendations for Other Services       Precautions / Restrictions Precautions Precautions: Fall Precaution Comments:  keep SBP 120-140 Restrictions Weight Bearing Restrictions: No    Mobility  Bed Mobility Overal bed mobility: Needs Assistance Bed Mobility: Rolling;Sidelying to Sit Rolling: Mod assist (to his left) Sidelying to sit: Max assist;HOB elevated;+2 for physical assistance       General bed mobility comments: pt requires max cues and incr time to reach and find rail with RUE (rolling to his left) in part due to double-vision in left field of vision; assisted legs over EOB attempting to keep in hip and knee flexion, however with effort to push up to sitting, his RLE shot out into extension and required incr assist to come to sit EOB  Transfers Overall transfer level: Needs assistance   Transfers: Sit to/from Stand Sit to Stand: Mod assist;From elevated surface (elevated bed, seat of stedy)         General transfer comment: pt performed ~10 reps with various cues/techniques to decr  retropulsion  Ambulation/Gait             General Gait Details: unable   Stairs             Wheelchair Mobility    Modified Rankin (Stroke Patients Only) Modified Rankin (Stroke Patients Only) Pre-Morbid Rankin Score: No significant disability Modified Rankin: Severe disability     Balance Overall balance assessment: Needs assistance Sitting-balance support: Feet supported;Bilateral upper extremity supported Sitting balance-Leahy Scale: Poor Sitting balance - Comments: able to achieve minguard assist at EOB and on stedy; close guarding due to h/o extensor tone, retropulsion Postural control: Posterior lean;Right lateral lean Standing balance support: Bilateral upper extremity supported Standing balance-Leahy Scale: Poor Standing balance comment: retropulsion                            Cognition Arousal/Alertness: Awake/alert Behavior During Therapy: Restless Overall Cognitive Status: No family/caregiver present to determine baseline cognitive functioning Area of Impairment: Attention;Following commands;Safety/judgement;Problem solving;Awareness                   Current Attention Level: Focused;Sustained   Following Commands: Follows one step commands with increased time;Follows one step commands inconsistently (has difficulty controlling LUE ) Safety/Judgement: Decreased awareness of deficits Awareness: Intellectual Problem Solving: Slow processing;Decreased initiation;Difficulty sequencing;Requires verbal cues;Requires tactile cues General Comments: Continues with difficulty controlling LUE, not moving as constantly as on 9/21      Exercises      General Comments General comments (skin integrity, edema, etc.): During seated rest between sit to  stand trials, worked on left cervical rotation and especially left lateral flexion (pt holds head in rt lateral flexion, rt rotation)      Pertinent Vitals/Pain Pain Assessment: No/denies  pain Faces Pain Scale: No hurt    Home Living                      Prior Function            PT Goals (current goals can now be found in the care plan section) Acute Rehab PT Goals Patient Stated Goal: Agreeable to therapy Time For Goal Achievement: 09/22/20 Potential to Achieve Goals: Fair Progress towards PT goals: Progressing toward goals    Frequency    Min 4X/week      PT Plan Current plan remains appropriate    Co-evaluation              AM-PAC PT "6 Clicks" Mobility   Outcome Measure  Help needed turning from your back to your side while in a flat bed without using bedrails?: A Lot Help needed moving from lying on your back to sitting on the side of a flat bed without using bedrails?: A Lot Help needed moving to and from a bed to a chair (including a wheelchair)?: A Lot Help needed standing up from a chair using your arms (e.g., wheelchair or bedside chair)?: A Lot Help needed to walk in hospital room?: Total Help needed climbing 3-5 steps with a railing? : Total 6 Click Score: 10    End of Session Equipment Utilized During Treatment: Gait belt Activity Tolerance: Patient tolerated treatment well Patient left: with call bell/phone within reach;in chair;with chair alarm set Nurse Communication: Mobility status;Need for lift equipment (stedy) PT Visit Diagnosis: Other abnormalities of gait and mobility (R26.89);Other symptoms and signs involving the nervous system (R29.898);Hemiplegia and hemiparesis Hemiplegia - Right/Left: Left Hemiplegia - dominant/non-dominant: Non-dominant Hemiplegia - caused by: Nontraumatic intracerebral hemorrhage     Time: 1002-1037 PT Time Calculation (min) (ACUTE ONLY): 35 min  Charges:  $Neuromuscular Re-education: 23-37 mins                      Jerolyn Center, PT Pager [phone]    [name] 09/15/2020, 11:19 AM

## 2020-09-15 NOTE — H&P (Signed)
Physical Medicine and Rehabilitation Admission H&P    Chief Complaint  Patient presents with  . Stroke Symptoms  : HPI: Zachary Flores is a 54 year old right-handed male with history of hypertension,  as well as alcohol/tobacco use.  History taken from chart review and patient due to cognition.  Patient lives with brother.  Works in a [employer].  He uses a straight cane.  He presented on 09/06/2020 with left hemiparesis, dysarthria and diplopia.  Blood pressure was noted to be 150/99.  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  Severe chronic small vessel ischemic disease.  Admission chemistries glucose 188, SARS coronavirus negative urine drug screen positive cocaine.  Neurology follow-up conservative care monitoring of blood pressure.  Latest echocardiogram with ejection fraction of 55-60%, without emboli.  Patient did have episodes of agitation restlessness needing Precedex as well as CIWA protocol and currently maintained on Seroquel.  Hospital course further complicated by dysphagia, initially with nasogastric tube for nutritional support his diet has been advanced to a dysphagia #2 thin liquid.  Therapy evaluations completed and patient was admitted for a comprehensive rehab program.  Please see preadmission assessment from earlier today as well.  Review of Systems  Constitutional: Negative for chills and fever.  HENT: Negative for hearing loss.   Eyes: Negative for blurred vision and double vision.  Respiratory: Negative for cough and shortness of breath.   Cardiovascular: Negative for chest pain, palpitations and leg swelling.  Gastrointestinal: Positive for constipation. Negative for heartburn and nausea.  Genitourinary: Positive for urgency. Negative for dysuria, flank pain and hematuria.  Musculoskeletal: Positive for myalgias.  Skin: Negative for rash.  Neurological: Positive for speech change, focal weakness and  weakness.  All other systems reviewed and are negative.  Past medical history: Hypertension No past surgical history Family History  Family history unknown: Yes  Patient limited historian   Social History:  reports that he has been smoking. He has never used smokeless tobacco. He reports current alcohol use of about 24.0 standard drinks of alcohol per week. No history on file for drug use. Allergies: No Known Allergies Medications Prior to Admission  Medication Sig Dispense Refill  . lisinopril (ZESTRIL) 5 MG tablet Take 1 tablet (5 mg total) by mouth daily. 30 tablet 0  . metoprolol tartrate (LOPRESSOR) 25 MG tablet Take 1 tablet (25 mg total) by mouth 2 (two) times daily. 60 tablet 0    Drug Regimen Review Drug regimen was reviewed and remains appropriate with no significant issues identified  Home: Home Living Family/patient expects to be discharged to:: Private residence Living Arrangements: Other relatives (brother) Available Help at Discharge: Family, Available 24 hours/day Type of Home: Apartment Home Access: Level entry Home Layout: One level Bathroom Shower/Tub: Engineer, manufacturing systems: Standard Bathroom Accessibility: Yes Home Equipment: Cane - single point Additional Comments: Patient unable to give details about home set up; does report he used a cane PTA  Lives With: Family   Functional History: Prior Function Comments: 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[employer], 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; reports being mod independent using SPC but unsure of accuracy   Functional Status:  Mobility: Bed Mobility Overal bed mobility: Needs Assistance Bed Mobility: Rolling Rolling: Mod assist, Max assist (to each side x 2) Sidelying to sit: Max assist, HOB elevated, +2 for physical assistance Supine to sit: Max assist, +2 for physical assistance Sit to supine: Max assist, +2  for physical assistance Sit to sidelying: Mod assist, +2 for physical assistance General bed  mobility comments: pt with inability to grasp rail with LUE (even with placing hand on rail); some difficulty grasping with rt hand when rolling to left; difficulty flexion LLE for rolling (pushes into extension); able to fully flex LLE in left sidelying Transfers Overall transfer level: Needs assistance Equipment used: 2 person hand held assist Transfers: Sit to/from Stand Sit to Stand: Mod assist General transfer comment: deferred due to no +2 assist  Ambulation/Gait General Gait Details: unable    ADL: ADL Overall ADL's : Needs assistance/impaired Eating/Feeding: NPO Grooming: Maximal assistance, Sitting, Wash/dry face Grooming Details (indicate cue type and reason): significant assist for sitting balance  EOB (maxA) with cues to initiate and perform basic ADL task General ADL Comments: pt requiring totalA for additional ADL at this time   Cognition: Cognition Overall Cognitive Status: No family/caregiver present to determine baseline cognitive functioning Arousal/Alertness:  (fluctuating) Orientation Level: Oriented to person, Oriented to place, Disoriented to time, Disoriented to situation Attention: Focused, Sustained Focused Attention: Impaired Focused Attention Impairment: Functional basic Sustained Attention: Impaired Sustained Attention Impairment: Verbal basic, Functional basic Memory: Impaired Memory Impairment: Storage deficit, Retrieval deficit, Decreased recall of new information, Decreased short term memory Decreased Short Term Memory: Verbal basic Awareness: Impaired Awareness Impairment: Emergent impairment Problem Solving: Impaired Problem Solving Impairment: Functional basic, Verbal basic Safety/Judgment: Impaired Cognition Arousal/Alertness: Awake/alert Behavior During Therapy: Restless Overall Cognitive Status: No family/caregiver present to determine baseline cognitive functioning Area of Impairment: Attention, Following commands, Safety/judgement,  Problem solving, Awareness Orientation Level: Disoriented to, Time, Place, Situation Current Attention Level: Focused, Sustained Following Commands: Follows one step commands with increased time, Follows one step commands inconsistently Safety/Judgement: Decreased awareness of deficits, Decreased awareness of safety Awareness: Intellectual Problem Solving: Slow processing, Decreased initiation, Difficulty sequencing, Requires verbal cues, Requires tactile cues General Comments: Constant random movements of LUE (?alien arm) that settled down in left sidelying; this was distracting for pt  Physical Exam: Blood pressure 110/85, pulse 94, temperature 97.8 F (36.6 C), temperature source Oral, resp. rate 18, height 5\' 7"  (1.702 m), weight 77.7 kg, SpO2 99 %. Physical Exam Vitals reviewed.  Constitutional:      General: He is not in acute distress.    Appearance: He is normal weight.  HENT:     Head: Normocephalic and atraumatic.     Right Ear: External ear normal.     Left Ear: External ear normal.     Nose: Nose normal.  Eyes:     General:        Right eye: No discharge.        Left eye: No discharge.     Comments: Limited EOM  Cardiovascular:     Rate and Rhythm: Normal rate and regular rhythm.  Pulmonary:     Effort: Pulmonary effort is normal. No respiratory distress.     Breath sounds: Normal breath sounds. No stridor.  Abdominal:     General: Abdomen is flat. Bowel sounds are normal. There is no distension.  Musculoskeletal:     Cervical back: Normal range of motion and neck supple.     Comments: No edema or tenderness in extremities  Skin:    General: Skin is warm and dry.  Neurological:     Mental Status: He is alert.     Comments: Alert and oriented x2 Dysarthria Follows simple commands.   Limited awareness of deficits but he did state weakness of his  left side. Motor: RUE: 4+/5 proximal distal RLE: 4/5 proximal distal LUE: 4/5 proximal distal LLE: 3+/5 proximal  distal  Psychiatric:        Speech: Speech is delayed and slurred.        Cognition and Memory: Cognition is impaired.     Results for orders placed or performed during the hospital encounter of 09/06/20 (from the past 48 hour(s))  Glucose, capillary     Status: None   Collection Time: 09/13/20  4:13 PM  Result Value Ref Range   Glucose-Capillary 77 70 - 99 mg/dL    Comment: Glucose reference range applies only to samples taken after fasting for at least 8 hours.   Comment 1 Notify RN    Comment 2 Document in Chart   Glucose, capillary     Status: Abnormal   Collection Time: 09/13/20 11:47 PM  Result Value Ref Range   Glucose-Capillary 106 (H) 70 - 99 mg/dL    Comment: Glucose reference range applies only to samples taken after fasting for at least 8 hours.  Glucose, capillary     Status: None   Collection Time: 09/14/20  6:12 AM  Result Value Ref Range   Glucose-Capillary 87 70 - 99 mg/dL    Comment: Glucose reference range applies only to samples taken after fasting for at least 8 hours.  Glucose, capillary     Status: Abnormal   Collection Time: 09/14/20  3:18 PM  Result Value Ref Range   Glucose-Capillary 106 (H) 70 - 99 mg/dL    Comment: Glucose reference range applies only to samples taken after fasting for at least 8 hours.   Comment 1 Notify RN    Comment 2 Document in Chart   Basic metabolic panel     Status: Abnormal   Collection Time: 09/15/20  4:28 AM  Result Value Ref Range   Sodium 139 135 - 145 mmol/L   Potassium 4.5 3.5 - 5.1 mmol/L    Comment: SLIGHT HEMOLYSIS   Chloride 103 98 - 111 mmol/L   CO2 26 22 - 32 mmol/L   Glucose, Bld 102 (H) 70 - 99 mg/dL    Comment: Glucose reference range applies only to samples taken after fasting for at least 8 hours.   BUN 17 6 - 20 mg/dL   Creatinine, Ser 8.41 0.61 - 1.24 mg/dL   Calcium 9.6 8.9 - 66.0 mg/dL   GFR calc non Af Amer >60 >60 mL/min   GFR calc Af Amer >60 >60 mL/min   Anion gap 10 5 - 15    Comment:  Performed at [facility], [address], [place], [place] [address]  Glucose, capillary     Status: None   Collection Time: 09/15/20  6:15 AM  Result Value Ref Range   Glucose-Capillary 84 70 - 99 mg/dL    Comment: Glucose reference range applies only to samples taken after fasting for at least 8 hours.   Comment 1 Notify RN    Comment 2 Document in Chart    No results found.     Medical Problem List and Plan: 1.  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  Antithrombotics: -DVT/anticoagulation: SCDs  -antiplatelet therapy: N/A 3. Pain Management: Tylenol as needed 4. Mood: Advised emotional support  -antipsychotic agents: Seroquel 25 mg nightly 5. Neuropsych: This patient is ?  Fully capable of making decisions on his own behalf. 6. Skin/Wound Care: Routine skin checks 7. Fluids/Electrolytes/Nutrition: Routine in and outs.  CMP ordered for tomorrow. 8.  Post stroke dysphagia.  Dysphagia #2, thin liquids.  Follow-up speech therapy 9.  Hypertension.  Lopressor 25 mg twice daily, lisinopril 5 mg daily.    Monitor with increased mobility 10.  Polysubstance abuse-alcohol, tobacco, cocaine abuse.  Monitor for any signs of withdrawal.  Provide counseling   Charlton Amor, PA-C 09/15/2020  I have personally performed a face to face diagnostic evaluation, including, but not limited to relevant history and physical exam findings, of this patient and developed relevant assessment and plan.  Additionally, I have reviewed and concur with the physician assistant's documentation above.  Maryla Morrow, MD, ABPMR

## 2020-09-15 NOTE — Progress Notes (Signed)
OT NOTE  OT asked to (A) pt with plate guard. OT providing plateguard, plate and metal spoon this session. OT practicing correct use and positioning with patient using cotton balls to simulate food. Pt with good return demo.   Timmothy Euler, OTR/L  Acute Rehabilitation Services Pager: [phone] Office: [phone] Time 1300-1315.

## 2020-09-15 NOTE — TOC Transition Note (Signed)
Transition of Care [facility]) - CM/SW Discharge Note   Patient Details  Name: Zachary Flores MRN: [id number] Date of Birth: 11-Jul-1966  [facility] CM/SW Contact:  Kermit Balo, RN Phone Number: 09/15/2020, 1:26 PM   Clinical Narrative:    Pt discharging to CIR today. Cm signing off.    Final next level of care: IP Rehab Facility Barriers to Discharge: Inadequate or no insurance, Barriers Unresolved (comment)   Patient Goals and CMS Choice        Discharge Placement                       Discharge Plan and Services                                     Social Determinants of Health (SDOH) Interventions     Readmission Risk Interventions No flowsheet data found.

## 2020-09-15 NOTE — H&P (Signed)
Physical Medicine and Rehabilitation Admission H&P    Chief Complaint  Patient presents with  . Stroke Symptoms  : HPI: Zachary Flores is a 54 year old right-handed male with history of hypertension,  as well as alcohol/tobacco use.  History taken from chart review and patient due to cognition.  Patient lives with brother.  Works in a [employer].  He uses a straight cane.  He presented on 09/06/2020 with left hemiparesis, dysarthria and diplopia.  Blood pressure was noted to be 150/99.  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  Severe chronic small vessel ischemic disease.  Admission chemistries glucose 188, SARS coronavirus negative urine drug screen positive cocaine.  Neurology follow-up conservative care monitoring of blood pressure.  Latest echocardiogram with ejection fraction of 55-60%, without emboli.  Patient did have episodes of agitation restlessness needing Precedex as well as CIWA protocol and currently maintained on Seroquel.  Hospital course further complicated by dysphagia, initially with nasogastric tube for nutritional support his diet has been advanced to a dysphagia #2 thin liquid.  Therapy evaluations completed and patient was admitted for a comprehensive rehab program.  Please see preadmission assessment from earlier today as well.  Review of Systems  Constitutional: Negative for chills and fever.  HENT: Negative for hearing loss.   Eyes: Negative for blurred vision and double vision.  Respiratory: Negative for cough and shortness of breath.   Cardiovascular: Negative for chest pain, palpitations and leg swelling.  Gastrointestinal: Positive for constipation. Negative for heartburn and nausea.  Genitourinary: Positive for urgency. Negative for dysuria, flank pain and hematuria.  Musculoskeletal: Positive for myalgias.  Skin: Negative for rash.  Neurological: Positive for speech change, focal weakness and  weakness.  All other systems reviewed and are negative.  Past medical history: Hypertension No past surgical history Family History  Family history unknown: Yes  Patient limited historian   Social History:  reports that he has been smoking. He has never used smokeless tobacco. He reports current alcohol use of about 24.0 standard drinks of alcohol per week. No history on file for drug use. Allergies: No Known Allergies Medications Prior to Admission  Medication Sig Dispense Refill  . lisinopril (ZESTRIL) 5 MG tablet Take 1 tablet (5 mg total) by mouth daily. 30 tablet 0  . metoprolol tartrate (LOPRESSOR) 25 MG tablet Take 1 tablet (25 mg total) by mouth 2 (two) times daily. 60 tablet 0    Drug Regimen Review Drug regimen was reviewed and remains appropriate with no significant issues identified  Home: Home Living Family/patient expects to be discharged to:: Private residence Living Arrangements: Other relatives (brother) Available Help at Discharge: Family, Available 24 hours/day Type of Home: Apartment Home Access: Level entry Home Layout: One level Bathroom Shower/Tub: Engineer, manufacturing systems: Standard Bathroom Accessibility: Yes Home Equipment: Cane - single point Additional Comments: Patient unable to give details about home set up; does report he used a cane PTA  Lives With: Family   Functional History: Prior Function Comments: 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, 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; reports being mod independent using SPC but unsure of accuracy   Functional Status:  Mobility: Bed Mobility Overal bed mobility: Needs Assistance Bed Mobility: Rolling Rolling: Mod assist, Max assist (to each side x 2) Sidelying to sit: Max assist, HOB elevated, +2 for physical assistance Supine to sit: Max assist, +2 for physical assistance Sit to supine: Max assist, +2  for physical assistance Sit to sidelying: Mod assist, +2 for physical assistance General bed  mobility comments: pt with inability to grasp rail with LUE (even with placing hand on rail); some difficulty grasping with rt hand when rolling to left; difficulty flexion LLE for rolling (pushes into extension); able to fully flex LLE in left sidelying Transfers Overall transfer level: Needs assistance Equipment used: 2 person hand held assist Transfers: Sit to/from Stand Sit to Stand: Mod assist General transfer comment: deferred due to no +2 assist  Ambulation/Gait General Gait Details: unable    ADL: ADL Overall ADL's : Needs assistance/impaired Eating/Feeding: NPO Grooming: Maximal assistance, Sitting, Wash/dry face Grooming Details (indicate cue type and reason): significant assist for sitting balance  EOB (maxA) with cues to initiate and perform basic ADL task General ADL Comments: pt requiring totalA for additional ADL at this time   Cognition: Cognition Overall Cognitive Status: No family/caregiver present to determine baseline cognitive functioning Arousal/Alertness:  (fluctuating) Orientation Level: Oriented to person, Oriented to place, Disoriented to time, Disoriented to situation Attention: Focused, Sustained Focused Attention: Impaired Focused Attention Impairment: Functional basic Sustained Attention: Impaired Sustained Attention Impairment: Verbal basic, Functional basic Memory: Impaired Memory Impairment: Storage deficit, Retrieval deficit, Decreased recall of new information, Decreased short term memory Decreased Short Term Memory: Verbal basic Awareness: Impaired Awareness Impairment: Emergent impairment Problem Solving: Impaired Problem Solving Impairment: Functional basic, Verbal basic Safety/Judgment: Impaired Cognition Arousal/Alertness: Awake/alert Behavior During Therapy: Restless Overall Cognitive Status: No family/caregiver present to determine baseline cognitive functioning Area of Impairment: Attention, Following commands, Safety/judgement,  Problem solving, Awareness Orientation Level: Disoriented to, Time, Place, Situation Current Attention Level: Focused, Sustained Following Commands: Follows one step commands with increased time, Follows one step commands inconsistently Safety/Judgement: Decreased awareness of deficits, Decreased awareness of safety Awareness: Intellectual Problem Solving: Slow processing, Decreased initiation, Difficulty sequencing, Requires verbal cues, Requires tactile cues General Comments: Constant random movements of LUE (?alien arm) that settled down in left sidelying; this was distracting for pt  Physical Exam: Blood pressure 110/85, pulse 94, temperature 97.8 F (36.6 C), temperature source Oral, resp. rate 18, height 5\' 7"  (1.702 m), weight 77.7 kg, SpO2 99 %. Physical Exam Vitals reviewed.  Constitutional:      General: He is not in acute distress.    Appearance: He is normal weight.  HENT:     Head: Normocephalic and atraumatic.     Right Ear: External ear normal.     Left Ear: External ear normal.     Nose: Nose normal.  Eyes:     General:        Right eye: No discharge.        Left eye: No discharge.     Comments: Limited EOM  Cardiovascular:     Rate and Rhythm: Normal rate and regular rhythm.  Pulmonary:     Effort: Pulmonary effort is normal. No respiratory distress.     Breath sounds: Normal breath sounds. No stridor.  Abdominal:     General: Abdomen is flat. Bowel sounds are normal. There is no distension.  Musculoskeletal:     Cervical back: Normal range of motion and neck supple.     Comments: No edema or tenderness in extremities  Skin:    General: Skin is warm and dry.  Neurological:     Mental Status: He is alert.     Comments: Alert and oriented x2 Dysarthria Follows simple commands.   Limited awareness of deficits but he did state weakness of his  left side. Motor: RUE: 4+/5 proximal distal RLE: 4/5 proximal distal LUE: 4/5 proximal distal LLE: 3+/5 proximal  distal  Psychiatric:        Speech: Speech is delayed and slurred.        Cognition and Memory: Cognition is impaired.     Results for orders placed or performed during the hospital encounter of 09/06/20 (from the past 48 hour(s))  Glucose, capillary     Status: None   Collection Time: 09/13/20  4:13 PM  Result Value Ref Range   Glucose-Capillary 77 70 - 99 mg/dL    Comment: Glucose reference range applies only to samples taken after fasting for at least 8 hours.   Comment 1 Notify RN    Comment 2 Document in Chart   Glucose, capillary     Status: Abnormal   Collection Time: 09/13/20 11:47 PM  Result Value Ref Range   Glucose-Capillary 106 (H) 70 - 99 mg/dL    Comment: Glucose reference range applies only to samples taken after fasting for at least 8 hours.  Glucose, capillary     Status: None   Collection Time: 09/14/20  6:12 AM  Result Value Ref Range   Glucose-Capillary 87 70 - 99 mg/dL    Comment: Glucose reference range applies only to samples taken after fasting for at least 8 hours.  Glucose, capillary     Status: Abnormal   Collection Time: 09/14/20  3:18 PM  Result Value Ref Range   Glucose-Capillary 106 (H) 70 - 99 mg/dL    Comment: Glucose reference range applies only to samples taken after fasting for at least 8 hours.   Comment 1 Notify RN    Comment 2 Document in Chart   Basic metabolic panel     Status: Abnormal   Collection Time: 09/15/20  4:28 AM  Result Value Ref Range   Sodium 139 135 - 145 mmol/L   Potassium 4.5 3.5 - 5.1 mmol/L    Comment: SLIGHT HEMOLYSIS   Chloride 103 98 - 111 mmol/L   CO2 26 22 - 32 mmol/L   Glucose, Bld 102 (H) 70 - 99 mg/dL    Comment: Glucose reference range applies only to samples taken after fasting for at least 8 hours.   BUN 17 6 - 20 mg/dL   Creatinine, Ser 8.41 0.61 - 1.24 mg/dL   Calcium 9.6 8.9 - 66.0 mg/dL   GFR calc non Af Amer >60 >60 mL/min   GFR calc Af Amer >60 >60 mL/min   Anion gap 10 5 - 15    Comment:  Performed at [facility], [address], [place], [place] [address]  Glucose, capillary     Status: None   Collection Time: 09/15/20  6:15 AM  Result Value Ref Range   Glucose-Capillary 84 70 - 99 mg/dL    Comment: Glucose reference range applies only to samples taken after fasting for at least 8 hours.   Comment 1 Notify RN    Comment 2 Document in Chart    No results found.     Medical Problem List and Plan: 1.  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  Antithrombotics: -DVT/anticoagulation: SCDs  -antiplatelet therapy: N/A 3. Pain Management: Tylenol as needed 4. Mood: Advised emotional support  -antipsychotic agents: Seroquel 25 mg nightly 5. Neuropsych: This patient is ?  Fully capable of making decisions on his own behalf. 6. Skin/Wound Care: Routine skin checks 7. Fluids/Electrolytes/Nutrition: Routine in and outs.  CMP ordered for tomorrow. 8.  Post stroke dysphagia.  Dysphagia #2, thin liquids.  Follow-up speech therapy 9.  Hypertension.  Lopressor 25 mg twice daily, lisinopril 5 mg daily.    Monitor with increased mobility 10.  Polysubstance abuse-alcohol, tobacco, cocaine abuse.  Monitor for any signs of withdrawal.  Provide counseling   Charlton Amor, PA-C 09/15/2020  I have personally performed a face to face diagnostic evaluation, including, but not limited to relevant history and physical exam findings, of this patient and developed relevant assessment and plan.  Additionally, I have reviewed and concur with the physician assistant's documentation above.  Maryla Morrow, MD, ABPMR  The patient's status has not changed. The original post admission physician evaluation remains appropriate, and any changes from the pre-admission screening or documentation from the acute chart are noted above.    Maryla Morrow, MD, ABPMR

## 2020-09-15 NOTE — Discharge Summary (Signed)
[facility]  Physician Discharge Summary   Patient ID: Juriel Cid MRN: [id number] DOB/AGE: [birth year] 54 y.o.  Admit date: 09/06/2020 Discharge date: 09/15/2020  PCP: Patient, No Pcp Per  DISCHARGE DIAGNOSES:  Acute hemorrhagic stroke and central pontine Cocaine abuse Essential hypertension Acute delirium, improving Urinary retention   PATIENT BEING DISCHARGED TO CIR   RECOMMENDATIONS FOR OUTPATIENT FOLLOW UP: 1. Voiding trial once patient has improved mobility    Home Health: Per CIR Equipment/Devices: Per CIR  CODE STATUS: Full code  DISCHARGE CONDITION: fair  Diet recommendation: Dysphagia 2 diet with thin liquids  INITIAL HISTORY: 54 year old with hypertension supposed to be on lisinopril 5 mg metoprolol 25 twice daily , cocaine abuse presented with presented to the ED on 09/06/2020 w/ double vision starting that day and found to have left-sided weakness.  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 Patient had been agitated tachypneic diaphoretic needing Precedex drip CIWA Ativan and on intermittent antihypertensive drip.  Following weaning off of drip, transferred to hospitalist service on 9/16.  Currently being considered for inpatient rehab.   HOSPITAL COURSE:   Acute Central Pontine hemorrhage in the setting of hypertension and cocaine use Patient underwent work-up as per the hemorrhagic stroke protocol with neuro ICU with CT head and neck, repeat CT head with a stable pontine hemorrhage, extensive small vessel disease, 2D echo EF 55 to 60%, no source of embolus, LDL 100, hemoglobin A1c 5.9.   Patient also had a cortrack feeding tube which was removed after he did well on a modified barium swallow.   SLP continues to follow.  Diet being advanced slowly.  Currently on a dysphagia 2 diet. Stable from a neurological standpoint.  Patient noticing some improvement in the strength in the left upper and lower  extremities.  Delirium/acute toxic metabolic encephalopathy/alcohol withdrawal syndrome with complication  Initially on Precedex.  Also received Haldol as needed.  Was started on low-dose Seroquel.  Seen and cleared by psychiatry as well.  Continue folic acid multivitamins and thiamine. From a behavioral standpoint he is stable.  Tolerating his medications well.    Essential hypertension Blood pressure is reasonably well controlled.  Continue lisinopril and metoprolol.    Electrolytes are normal this morning.  Renal function is normal.  Left arm pain Probably musculoskeletal or related to his deficits from stroke.  Pain has improved with analgesic agents.  Continue to monitor  Cocaine abuse  Has been counseled.  Urine retention Patient had a coud catheter which is in place.  Continue Flomax.  Voiding trial once he is more mobile.   Overall he is stable.  Okay for discharge to CIR.  PERTINENT LABS:  The results of significant diagnostics from this hospitalization (including imaging, microbiology, ancillary and laboratory) are listed below for reference.    Microbiology: Recent Results (from the past 240 hour(s))  SARS Coronavirus 2 by RT PCR (hospital order, performed in [facility] hospital lab) Nasopharyngeal Nasopharyngeal Swab     Status: None   Collection Time: 09/06/20 11:08 AM   Specimen: Nasopharyngeal Swab  Result Value Ref Range Status   SARS Coronavirus 2 NEGATIVE NEGATIVE Final    Comment: (NOTE) SARS-CoV-2 target nucleic acids are NOT DETECTED.  The SARS-CoV-2 RNA is generally detectable in upper and lower respiratory specimens during the acute phase of infection. The lowest concentration of SARS-CoV-2 viral copies this assay can detect is 250 copies / mL. A negative result does not preclude SARS-CoV-2 infection and should not  be used as the sole basis for treatment or other patient management decisions.  A negative result may occur with improper specimen  collection / handling, submission of specimen other than nasopharyngeal swab, presence of viral mutation(s) within the areas targeted by this assay, and inadequate number of viral copies (<250 copies / mL). A negative result must be combined with clinical observations, patient history, and epidemiological information.  Fact Sheet for Patients:   BoilerBrush.com.cy  Fact Sheet for Healthcare Providers: https://pope.com/  This test is not yet approved or  cleared by the [place] FDA and has been authorized for detection and/or diagnosis of SARS-CoV-2 by FDA under an Emergency Use Authorization (EUA).  This EUA will remain in effect (meaning this test can be used) for the duration of the COVID-19 declaration under Section 564(b)(1) of the Act, 21 U.S.C. section 360bbb-3(b)(1), unless the authorization is terminated or revoked sooner.  Performed at [facility], [address], [place], [place] [address]   MRSA PCR Screening     Status: None   Collection Time: 09/09/20  6:00 AM  Result Value Ref Range Status   MRSA by PCR NEGATIVE NEGATIVE Final    Comment:        The GeneXpert MRSA Assay (FDA approved for NASAL specimens only), is one component of a comprehensive MRSA colonization surveillance program. It is not intended to diagnose MRSA infection nor to guide or monitor treatment for MRSA infections. Performed at [facility], [address] [place], [place] [address]      Labs:    Basic Metabolic Panel: Recent Labs  Lab 09/12/20 0726 09/15/20 0428  NA 143 139  K 3.8 4.5  CL 104 103  CO2 27 26  GLUCOSE 112* 102*  BUN 17 17  CREATININE 0.97 0.97  CALCIUM 9.7 9.6  MG 2.4  --    Liver Function Tests: Recent Labs  Lab 09/12/20 0726  AST 29  ALT 26  ALKPHOS 68  BILITOT 0.8  PROT 7.3  ALBUMIN 3.4*   CBC: Recent Labs  Lab 09/12/20 0726  WBC 5.8  HGB 15.1  HCT 48.1  MCV 92.5  PLT 405*    CBG: Recent Labs  Lab 09/13/20 1613 09/13/20 2347 09/14/20 0612 09/14/20 1518 09/15/20 0615  GLUCAP 77 106* 87 106* 84     IMAGING STUDIES CT Angio Head W or Wo Contrast  Result Date: 09/06/2020 CLINICAL DATA:  Left-sided weakness, numbness, and slurred speech. EXAM: CT ANGIOGRAPHY HEAD AND NECK TECHNIQUE: Multidetector CT imaging of the head and neck was performed using the standard protocol during bolus administration of intravenous contrast. Multiplanar CT image reconstructions and MIPs were obtained to evaluate the vascular anatomy. Carotid stenosis measurements (when applicable) are obtained utilizing NASCET criteria, using the distal internal carotid diameter as the denominator. CONTRAST:  125 mL Omnipaque 350 COMPARISON:  Head CT and MRI 06/30/2019 FINDINGS: CT HEAD FINDINGS Brain: An acute pontine hemorrhage measures 1.9 x 1.4 x 2.1 cm (estimated volume of 2.8 mL) with extension slightly greater to the left of midline than the right. There is no significant surrounding edema or posterior fossa mass effect. No acute supratentorial or extra-axial hemorrhage is identified. No acute large territory infarct, midline shift, or extra-axial fluid collection is evident. Patchy and confluent hypodensities in the cerebral white matter bilaterally are unchanged and nonspecific but compatible with severe chronic small vessel ischemic disease. Chronic lacunar infarcts in the right basal ganglia and left thalamus are unchanged. There is mild  cerebral atrophy. Vascular: Calcified atherosclerosis at the skull base. No hyperdense vessel. Skull: No fracture or suspicious osseous lesion. Sinuses: Visualized paranasal sinuses and mastoid air cells are clear. Orbits: Unremarkable. Review of the MIP images confirms the above findings CTA NECK FINDINGS Aortic arch: Normal variant aortic arch branching pattern with common origin of the brachiocephalic and left common carotid arteries. Right carotid system: Patent  with mild soft and calcified plaque in the distal cervical ICA. No evidence of significant stenosis or dissection. Left carotid system: Patent with mild, predominantly calcified plaque in the distal common carotid artery and proximal ICA. No evidence of significant stenosis or dissection. Vertebral arteries: Patent without evidence of significant stenosis or dissection. Dominant left vertebral artery. Skeleton: Mild cervical spondylosis. Other neck: No evidence of cervical lymphadenopathy or mass. Upper chest: Mild centrilobular and paraseptal emphysema. Review of the MIP images confirms the above findings CTA HEAD FINDINGS Anterior circulation: The internal carotid arteries are patent from skull base to carotid termini with mild calcified plaque bilaterally. No significant ICA stenosis is identified although assessment is limited by motion artifact mainly in the paraclinoid regions. ACAs and MCAs are patent ACAs and MCAs are patent without evidence of a proximal branch occlusion or significant A1 or M1 stenosis. Detail branch vessel evaluation is limited by motion artifact. No aneurysm is identified. Posterior circulation: The intracranial vertebral arteries are patent to the basilar with the right being hypoplastic distal to the PICA origin. The basilar artery is widely patent. There are large left and moderate-sized right posterior communicating arteries both PCAs are patent without evidence of a significant proximal stenosis. No aneurysm is identified. Venous sinuses: Patent. Anatomic variants: None. Review of the MIP images confirms the above findings IMPRESSION: 1. 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 2. Severe chronic small vessel ischemic disease. 3. Mild atherosclerosis in the head and neck without evidence of a large vessel occlusion, significant proximal stenosis, or aneurysm. 4.  Emphysema (ICD10-J43.9). Critical Value/emergent results were called by telephone at the time of interpretation on 09/06/2020 at  12:55 p.m. to Dr. Lynden Oxford, who verbally acknowledged these results. Electronically Signed   By: Sebastian Ache M.D.   On: 09/06/2020 13:53   CT HEAD WO CONTRAST  Result Date: 09/07/2020 CLINICAL DATA:  Follow-up parenchymal hemorrhage EXAM: CT HEAD WITHOUT CONTRAST TECHNIQUE: Contiguous axial images were obtained from the base of the skull through the vertex without intravenous contrast. COMPARISON:  Head CT from yesterday FINDINGS: Brain: Upper brainstem hematoma which is best visualized on sagittal images for reproducible measurement, unchanged in size at 21 by 14 mm. No visible adjacent brain edema. No visible intra ventricular extension. Extensive chronic small vessel ischemia in the cerebral white matter for age. Chronic lacune at the right corona radiata. Vascular: No hyperdense vessel or unexpected calcification. Skull: Negative Sinuses/Orbits: Negative IMPRESSION: 1. Size stable upper brainstem hematoma. No intraventricular extension or hydrocephalus. 2. Extensive chronic small vessel ischemia. Electronically Signed   By: Marnee Spring M.D.   On: 09/07/2020 06:08   CT Angio Neck W and/or Wo Contrast  Result Date: 09/06/2020 CLINICAL DATA:  Left-sided weakness, numbness, and slurred speech. EXAM: CT ANGIOGRAPHY HEAD AND NECK TECHNIQUE: Multidetector CT imaging of the head and neck was performed using the standard protocol during bolus administration of intravenous contrast. Multiplanar CT image reconstructions and MIPs were obtained to evaluate the vascular anatomy. Carotid stenosis measurements (when applicable) are obtained utilizing NASCET criteria, using the distal internal carotid diameter as the denominator. CONTRAST:  125 mL  Omnipaque 350 COMPARISON:  Head CT and MRI 06/30/2019 FINDINGS: CT HEAD FINDINGS Brain: An acute pontine hemorrhage measures 1.9 x 1.4 x 2.1 cm (estimated volume of 2.8 mL) with extension slightly greater to the left of midline than the right. There is no  significant surrounding edema or posterior fossa mass effect. No acute supratentorial or extra-axial hemorrhage is identified. No acute large territory infarct, midline shift, or extra-axial fluid collection is evident. Patchy and confluent hypodensities in the cerebral white matter bilaterally are unchanged and nonspecific but compatible with severe chronic small vessel ischemic disease. Chronic lacunar infarcts in the right basal ganglia and left thalamus are unchanged. There is mild cerebral atrophy. Vascular: Calcified atherosclerosis at the skull base. No hyperdense vessel. Skull: No fracture or suspicious osseous lesion. Sinuses: Visualized paranasal sinuses and mastoid air cells are clear. Orbits: Unremarkable. Review of the MIP images confirms the above findings CTA NECK FINDINGS Aortic arch: Normal variant aortic arch branching pattern with common origin of the brachiocephalic and left common carotid arteries. Right carotid system: Patent with mild soft and calcified plaque in the distal cervical ICA. No evidence of significant stenosis or dissection. Left carotid system: Patent with mild, predominantly calcified plaque in the distal common carotid artery and proximal ICA. No evidence of significant stenosis or dissection. Vertebral arteries: Patent without evidence of significant stenosis or dissection. Dominant left vertebral artery. Skeleton: Mild cervical spondylosis. Other neck: No evidence of cervical lymphadenopathy or mass. Upper chest: Mild centrilobular and paraseptal emphysema. Review of the MIP images confirms the above findings CTA HEAD FINDINGS Anterior circulation: The internal carotid arteries are patent from skull base to carotid termini with mild calcified plaque bilaterally. No significant ICA stenosis is identified although assessment is limited by motion artifact mainly in the paraclinoid regions. ACAs and MCAs are patent ACAs and MCAs are patent without evidence of a proximal branch  occlusion or significant A1 or M1 stenosis. Detail branch vessel evaluation is limited by motion artifact. No aneurysm is identified. Posterior circulation: The intracranial vertebral arteries are patent to the basilar with the right being hypoplastic distal to the PICA origin. The basilar artery is widely patent. There are large left and moderate-sized right posterior communicating arteries both PCAs are patent without evidence of a significant proximal stenosis. No aneurysm is identified. Venous sinuses: Patent. Anatomic variants: None. Review of the MIP images confirms the above findings IMPRESSION: 1. 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 2. Severe chronic small vessel ischemic disease. 3. Mild atherosclerosis in the head and neck without evidence of a large vessel occlusion, significant proximal stenosis, or aneurysm. 4.  Emphysema (ICD10-J43.9). Critical Value/emergent results were called by telephone at the time of interpretation on 09/06/2020 at 12:55 p.m. to Dr. Lynden Oxford, who verbally acknowledged these results. Electronically Signed   By: Sebastian Ache M.D.   On: 09/06/2020 13:53   DG Swallowing Func-Speech Pathology  Result Date: 09/10/2020 Objective Swallowing Evaluation: Type of Study: MBS-Modified Barium Swallow Study  Patient Details Name: Jovante Hammitt MRN: [id number] Date of Birth: [birth year] Today's Date: 09/10/2020 Time: SLP Start Time (ACUTE ONLY): 1150 -SLP Stop Time (ACUTE ONLY): 1206 SLP Time Calculation (min) (ACUTE ONLY): 16 min Past Medical History: No past medical history on file. Past Surgical History: No past surgical history on file. HPI: Pt is a 54 yo male presenting with L sided numbness/weakness and dysarthria. CT revealed a pontine hemorrhage. PMH includes: polysubstance abuse (alcohol, cocaine), TIA, CVA, HTN  Subjective: pleasant, bilateral mitts on arms, follows all directions,  not consistently verbal Assessment / Plan / Recommendation CHL IP CLINICAL IMPRESSIONS 09/10/2020  Clinical Impression Patient presents with a primary oral dysphagia leading to premature spillage of puree solids and thin liquids boluses and with initiation of swallow starting at level of vallecular sinus. Patient did not exhibit any aspiration or penetration with any boluses, even with large volume straw sips of thin liquids, and only trace to mild pharyngeal residuals that appeared to be around cortrak tube in pharynx. No retrograde movement of boluses noted. Patient exhibite delayed mastication, weak lingual maniuplation and bolus transit in oral cavity from anterior to posterior position. SLP Visit Diagnosis Dysphagia, oropharyngeal phase (R13.12) Attention and concentration deficit following -- Frontal lobe and executive function deficit following -- Impact on safety and function Mild aspiration risk   CHL IP TREATMENT RECOMMENDATION 09/10/2020 Treatment Recommendations Therapy as outlined in treatment plan below   Prognosis 09/10/2020 Prognosis for Safe Diet Advancement Good Barriers to Reach Goals -- Barriers/Prognosis Comment -- CHL IP DIET RECOMMENDATION 09/10/2020 SLP Diet Recommendations Thin liquid;Dysphagia 2 (Fine chop) solids Liquid Administration via Cup;Straw Medication Administration Crushed with puree Compensations Minimize environmental distractions;Slow rate;Small sips/bites Postural Changes --   CHL IP OTHER RECOMMENDATIONS 09/10/2020 Recommended Consults -- Oral Care Recommendations Oral care BID Other Recommendations --   CHL IP FOLLOW UP RECOMMENDATIONS 09/10/2020 Follow up Recommendations Inpatient Rehab   CHL IP FREQUENCY AND DURATION 09/10/2020 Speech Therapy Frequency (ACUTE ONLY) min 2x/week Treatment Duration 2 weeks      CHL IP ORAL PHASE 09/10/2020 Oral Phase Impaired Oral - Pudding Teaspoon -- Oral - Pudding Cup -- Oral - Honey Teaspoon -- Oral - Honey Cup -- Oral - Nectar Teaspoon -- Oral - Nectar Cup -- Oral - Nectar Straw -- Oral - Thin Teaspoon -- Oral - Thin Cup Reduced posterior  propulsion;Premature spillage Oral - Thin Straw Premature spillage Oral - Puree Weak lingual manipulation;Reduced posterior propulsion;Delayed oral transit;Premature spillage Oral - Mech Soft Impaired mastication;Weak lingual manipulation;Reduced posterior propulsion;Delayed oral transit Oral - Regular -- Oral - Multi-Consistency -- Oral - Pill Decreased bolus cohesion;Other (Comment) Oral Phase - Comment --  CHL IP PHARYNGEAL PHASE 09/10/2020 Pharyngeal Phase Impaired Pharyngeal- Pudding Teaspoon -- Pharyngeal -- Pharyngeal- Pudding Cup -- Pharyngeal -- Pharyngeal- Honey Teaspoon -- Pharyngeal -- Pharyngeal- Honey Cup -- Pharyngeal -- Pharyngeal- Nectar Teaspoon -- Pharyngeal -- Pharyngeal- Nectar Cup -- Pharyngeal -- Pharyngeal- Nectar Straw -- Pharyngeal -- Pharyngeal- Thin Teaspoon -- Pharyngeal -- Pharyngeal- Thin Cup Delayed swallow initiation-vallecula Pharyngeal -- Pharyngeal- Thin Straw Delayed swallow initiation-vallecula;Delayed swallow initiation-pyriform sinuses Pharyngeal -- Pharyngeal- Puree Delayed swallow initiation-vallecula Pharyngeal -- Pharyngeal- Mechanical Soft WFL Pharyngeal -- Pharyngeal- Regular -- Pharyngeal -- Pharyngeal- Multi-consistency -- Pharyngeal -- Pharyngeal- Pill -- Pharyngeal -- Pharyngeal Comment --  CHL IP CERVICAL ESOPHAGEAL PHASE 09/10/2020 Cervical Esophageal Phase WFL Pudding Teaspoon -- Pudding Cup -- Honey Teaspoon -- Honey Cup -- Nectar Teaspoon -- Nectar Cup -- Nectar Straw -- Thin Teaspoon -- Thin Cup -- Thin Straw -- Puree -- Mechanical Soft -- Regular -- Multi-consistency -- Pill -- Cervical Esophageal Comment -- Angela Nevin, MA, CCC-SLP Speech Therapy [facility] Acute Rehab              DISCHARGE EXAMINATION: Vitals:   09/14/20 2331 09/15/20 0348 09/15/20 0400 09/15/20 0833  BP: 116/75 92/70 110/85 112/85  Pulse: 72 (!) 58 94 65  Resp: Temp: 98.2 F (36.8 C) 97.9 F (36.6 C) 97.8 F (36.6 C) 97.7 F (36.5 C)  TempSrc:  Oral Oral Oral Oral   SpO2: 98% 99% 99% 95%  Weight:      Height:       General appearance: Awake alert.  In no distress Resp: Clear to auscultation bilaterally.  Normal effort Cardio: S1-S2 is normal regular.  No S3-S4.  No rubs murmurs or bruit GI: Abdomen is soft.  Nontender nondistended.  Bowel sounds are present normal.  No masses organomegaly Able to move his left arm.  No swelling noted.  No erythema.  No skin rashes.   DISPOSITION: CIR     Current Inpatient Medications:  Scheduled: . chlorhexidine  15 mL Mouth Rinse BID  . Chlorhexidine Gluconate Cloth  6 each Topical Daily  . feeding supplement (ENSURE ENLIVE)  237 mL Oral TID BM  . folic acid  1 mg Oral Daily  . influenza vac split quadrivalent PF  0.5 mL Intramuscular Tomorrow-1000  . lisinopril  5 mg Oral Daily  . mouth rinse  15 mL Mouth Rinse q12n4p  . metoprolol tartrate  25 mg Oral BID  . multivitamin with minerals  1 tablet Oral Daily  . pantoprazole  40 mg Oral Daily  . pneumococcal 23 valent vaccine  0.5 mL Intramuscular Tomorrow-1000  . QUEtiapine  25 mg Oral QHS  . senna-docusate  1 tablet Oral BID  . thiamine  100 mg Oral Daily   Continuous:  [id number]:acetaminophen **OR** acetaminophen, labetalol   TOTAL DISCHARGE TIME: 35 minutes  Vladislav Axelson [employer]  [facility] Pager on [email or web address]  09/15/2020, 11:00 AM

## 2020-09-15 NOTE — Progress Notes (Signed)
Inpatient Rehabilitation  Patient information reviewed and entered into eRehab system by Dayne Dekay M. Jyllian Haynie, M.A., CCC/SLP, PPS Coordinator.  Information including medical coding, functional ability and quality indicators will be reviewed and updated through discharge.    

## 2020-09-15 NOTE — Progress Notes (Signed)
Pt arrived on unit per bed in stable condition. Arrived with belongings. Denies pain, oriented to room, call light in reach and educated on calling for assistance. No other needs identified.

## 2020-09-15 NOTE — Progress Notes (Addendum)
Inpatient Rehabilitation Medication Review by a Pharmacist  A complete drug regimen review was completed for this patient to identify any potential clinically significant medication issues.  Clinically significant medication issues were identified:  yes   Type of Medication Issue Identified Description of Issue Urgent (address now) Non-Urgent (address on AM team rounds) Plan Plan Accepted by Provider? (Yes / No / Pending AM Rounds)  Drug Interaction(s) (clinically significant)       Duplicate Therapy       Allergy       No Medication Administration End Date       Incorrect Dose       Additional Drug Therapy Needed       Other  Per discharge note on 9/22 by Dr. Rito Ehrlich, patient with catheter in place and plan to continue Flomax on discharge, attempt voiding trial when patient is more stable. However, patient has no history of being on Flomax during hospitalization per chart review. Flomax was not ordered for this rehab admission. Non-urgent Clarify Flomax with team rounds in morning     For non-urgent medication issues to be resolved on team rounds tomorrow morning a [facility] Secure Chat Handoff was sent to: after hours, did not send secure chat, follow up in AM   Pharmacist comments: Please contact team on morning rounds to clarify need for Flomax.  Time spent performing this drug regimen review (minutes):  15  Laverna Peace, PharmD PGY-1 Pharmacy Resident 09/15/2020 7:00 PM Please see AMION for all pharmacy numbers

## 2020-09-15 NOTE — Progress Notes (Signed)
Inpatient Rehab Admissions Coordinator:   I have a bed available for pt to admit to CIR today.  Dr. Rito Ehrlich in agreement. TOC team aware, I will let pt/family know.   Estill Dooms, PT, DPT Admissions Coordinator [phone] 09/15/20  10:28 AM

## 2020-09-16 ENCOUNTER — Inpatient Hospital Stay (HOSPITAL_COMMUNITY): Payer: Self-pay | Admitting: Occupational Therapy

## 2020-09-16 ENCOUNTER — Inpatient Hospital Stay (HOSPITAL_COMMUNITY): Payer: Self-pay | Admitting: Speech Pathology

## 2020-09-16 ENCOUNTER — Inpatient Hospital Stay (HOSPITAL_COMMUNITY): Payer: Self-pay

## 2020-09-16 ENCOUNTER — Inpatient Hospital Stay (HOSPITAL_COMMUNITY): Payer: Self-pay | Admitting: Physical Therapy

## 2020-09-16 DIAGNOSIS — I613 Nontraumatic intracerebral hemorrhage in brain stem: Secondary | ICD-10-CM

## 2020-09-16 LAB — CBC WITH DIFFERENTIAL/PLATELET
Abs Immature Granulocytes: 0.02 10*3/uL (ref 0.00–0.07)
Basophils Absolute: 0.1 10*3/uL (ref 0.0–0.1)
Basophils Relative: 1 %
Eosinophils Absolute: 0.2 10*3/uL (ref 0.0–0.5)
Eosinophils Relative: 2 %
HCT: 47.4 % (ref 39.0–52.0)
Hemoglobin: 14.9 g/dL (ref 13.0–17.0)
Immature Granulocytes: 0 %
Lymphocytes Relative: 23 %
Lymphs Abs: 1.6 10*3/uL (ref 0.7–4.0)
MCH: 28.8 pg (ref 26.0–34.0)
MCHC: 31.4 g/dL (ref 30.0–36.0)
MCV: 91.7 fL (ref 80.0–100.0)
Monocytes Absolute: 1 10*3/uL (ref 0.1–1.0)
Monocytes Relative: 14 %
Neutro Abs: 3.9 10*3/uL (ref 1.7–7.7)
Neutrophils Relative %: 60 %
Platelets: 468 10*3/uL — ABNORMAL HIGH (ref 150–400)
RBC: 5.17 MIL/uL (ref 4.22–5.81)
RDW: 13.2 % (ref 11.5–15.5)
WBC: 6.7 10*3/uL (ref 4.0–10.5)
nRBC: 0 % (ref 0.0–0.2)

## 2020-09-16 LAB — COMPREHENSIVE METABOLIC PANEL
ALT: 19 U/L (ref 0–44)
AST: 22 U/L (ref 15–41)
Albumin: 3.6 g/dL (ref 3.5–5.0)
Alkaline Phosphatase: 70 U/L (ref 38–126)
Anion gap: 10 (ref 5–15)
BUN: 17 mg/dL (ref 6–20)
CO2: 27 mmol/L (ref 22–32)
Calcium: 9.5 mg/dL (ref 8.9–10.3)
Chloride: 106 mmol/L (ref 98–111)
Creatinine, Ser: 1.02 mg/dL (ref 0.61–1.24)
GFR calc Af Amer: >60 mL/min (ref >60)
GFR calc non Af Amer: >60 mL/min (ref >60)
Glucose, Bld: 108 mg/dL — ABNORMAL HIGH (ref 70–99)
Potassium: 4.1 mmol/L (ref 3.5–5.1)
Sodium: 143 mmol/L (ref 135–145)
Total Bilirubin: 0.2 mg/dL — ABNORMAL LOW (ref 0.3–1.2)
Total Protein: 7.1 g/dL (ref 6.5–8.1)

## 2020-09-16 MED ORDER — CHLORHEXIDINE GLUCONATE CLOTH 2 % EX PADS
6.0000 | MEDICATED_PAD | Freq: Two times a day (BID) | CUTANEOUS | Status: DC
  Administered 2020-09-17: 6 via TOPICAL

## 2020-09-16 MED ORDER — CHLORHEXIDINE GLUCONATE CLOTH 2 % EX PADS: 6.0000 | MEDICATED_PAD | Freq: Every day | CUTANEOUS | Status: DC

## 2020-09-16 MED ORDER — LISINOPRIL 2.5 MG PO TABS
2.5000 mg | ORAL_TABLET | Freq: Every day | ORAL | Status: DC
  Filled 2020-09-16: qty 1

## 2020-09-16 NOTE — Progress Notes (Deleted)
Inpatient Rehabilitation  Patient information reviewed and entered into eRehab system by Yalonda Sample M. Christhoper Busbee, M.A., CCC/SLP, PPS Coordinator.  Information including medical coding, functional ability and quality indicators will be reviewed and updated through discharge.    

## 2020-09-16 NOTE — Progress Notes (Signed)
[facility] Individual Statement of Services  Patient Name:  Zachary Flores  Date:  09/16/2020  Welcome to the [facility].  Our goal is to provide you with an individualized program based on your diagnosis and situation, designed to meet your specific needs.  With this comprehensive rehabilitation program, you will be expected to participate in at least 3 hours of rehabilitation therapies Monday-Friday, with modified therapy programming on the weekends.  Your rehabilitation program will include the following services:  Physical Therapy (PT), Occupational Therapy (OT), Speech Therapy (ST), 24 hour per day rehabilitation nursing, Therapeutic Recreaction (TR), Neuropsychology, Care Coordinator, Rehabilitation Medicine, Nutrition Services and Pharmacy Services  Weekly team conferences will be held on Wednesday to discuss your progress.  Your Inpatient Rehabilitation Care Coordinator will talk with you frequently to get your input and to update you on team discussions.  Team conferences with you and your family in attendance may also be held.  Expected length of stay: 3 weeks  Overall anticipated outcome: supervision-min level  Depending on your progress and recovery, your program may change. Your Inpatient Rehabilitation Care Coordinator will coordinate services and will keep you informed of any changes. Your Inpatient Rehabilitation Care Coordinator's name and contact numbers are listed  below.  The following services may also be recommended but are not provided by the Inpatient [facility]:    Home Health Rehabiltiation Services  Outpatient Rehabilitation Services  Vocational Rehabilitation   Arrangements will be made to provide these services after discharge if needed.  Arrangements include referral to agencies that provide these services.  Your insurance has been verified to be:  Self pay Your primary doctor is:  None  Pertinent  information will be shared with your doctor and your insurance company.  Inpatient Rehabilitation Care Coordinator:  Dossie Der, [place] [phone] or Luna [place]  Information discussed with and copy given to patient by: Lucy Chris, 09/16/2020, 12:24 PM

## 2020-09-16 NOTE — Evaluation (Signed)
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 No pertinent past medical history. Past Surgical History: History reviewed. No pertinent surgical history.  Assessment / Plan / Recommendation Clinical Impression   Patient is a 54 year old right-handed male with history of hypertension, as well as alcohol/tobacco use. History taken from chart review and patient due to cognition. Patient lives with brother. Works in a [employer]. He uses a straight cane. He presented on 09/06/2020 with left hemiparesis, dysarthria and diplopia. Blood pressure was noted to be 150/99. 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 Admission chemistries glucose 188, SARS coronavirus negative urine drug screen positive cocaine. Neurology follow-up conservative care monitoring of blood pressure. Latest echocardiogram with ejection fraction of 55-60%, without emboli. Patient did have episodes of agitation restlessness needing Precedex as well as CIWA protocol and currently maintained on Seroquel. Hospital course further complicated by dysphagia, initially with nasogastric tube for nutritional support his diet has been advanced to a dysphagia #2 thin liquid. Therapy evaluations completed and patient was admitted for a comprehensive rehab program.   Patient presents with a moderate cognitive-linguistic disorder, mild flaccid  dysarthria and a moderate oral but mild pharyngeal dysphagia. He is oriented to month, year and place but not situation, as he states, "I got sick". Affect is abnormal with monotone voice and poor eye contact throughout, but patient initiating to respond to questions. (not initiating spontaneously). Patient able to name objects presented but not able to complete convergent naming task. He followed 1-2 step commands at basic level but not 3-step. He was perseverative during conversational level and required redirection cues.  His dysphagia appears to be primarily oral and cognitive based with anterior spillage and poor awareness to regular solids (cracker) when chewing as well as oral residuals post swallow. Liquid wash and bites of puree did help to clear residuals. Speech intelligibility at phrase and sentence level was 75% approximately. He will benefit from skilled SLP intervention to maximize his cognitive-linguistic, speech and swallow function prior to discharge.     Skilled Therapeutic Interventions          Bedside swallow evaluation, speech-language and cognitive evaluation  SLP Assessment  Patient will need skilled Speech Lanaguage Pathology Services during CIR admission    Recommendations  SLP Diet Recommendations: Dysphagia 2 (Fine chop);Thin Liquid Administration via: Cup;Straw Medication Administration: Whole meds with puree Supervision: Patient able to self feed;Full supervision/cueing for compensatory strategies Compensations: Minimize environmental distractions;Slow rate;Small sips/bites;Follow solids with liquid;Lingual sweep for clearance of pocketing;Monitor for anterior loss Postural Changes and/or Swallow Maneuvers: Seated upright 90 degrees;Upright 30-60 min after meal Oral Care Recommendations: Oral care BID Recommendations for Other Services: Neuropsych consult Patient destination: Home Follow up Recommendations: Home Health SLP;24 hour supervision/assistance Equipment  Recommended: None recommended by SLP    SLP Frequency 3 to 5 out of 7 days   SLP Duration  SLP Intensity  SLP Treatment/Interventions 3 weeks  Minumum of 1-2 x/day, 30 to 90 minutes  Cognitive remediation/compensation;Internal/external aids;Functional tasks;Patient/family education;Speech/Language facilitation;Therapeutic Activities;Medication  managment;Dysphagia/aspiration precaution training    Pain Pain Assessment Pain Scale: 0-10 Pain Score: 0-No pain  Prior Functioning Cognitive/Linguistic Baseline: Within functional limits Type of Home: Apartment  Lives With: Family Available Help at Discharge: Family;Available 24 hours/day  SLP Evaluation Cognition Overall Cognitive Status: Impaired/Different from baseline Arousal/Alertness: Awake/alert Orientation Level: Oriented to person;Oriented to place;Disoriented to situation;Disoriented to time Attention: Sustained Focused Attention: Impaired Focused Attention Impairment: Verbal basic;Functional basic Sustained Attention: Impaired Sustained Attention Impairment: Verbal basic;Functional basic Selective Attention: Impaired Selective Attention Impairment: Verbal basic;Functional basic Memory: Impaired Memory Impairment: Storage deficit;Retrieval deficit Decreased Short Term Memory: Verbal basic Immediate Memory Recall: Sock;Blue;Bed Memory Recall Sock: Not able to recall Memory Recall Blue: Without Cue Memory Recall Bed: Not able to recall Awareness: Impaired Awareness Impairment: Emergent impairment Problem Solving: Impaired Problem Solving Impairment: Verbal basic;Functional basic Executive Function: Sequencing;Initiating;Self Monitoring Sequencing: Impaired Sequencing Impairment: Verbal basic;Functional basic Decision Making: Impaired Decision Making Impairment: Verbal basic;Functional basic Initiating: Impaired Initiating Impairment: Verbal basic;Verbal complex Self Monitoring: Impaired Self Monitoring  Impairment: Verbal basic;Functional basic Safety/Judgment: Impaired  Comprehension Auditory Comprehension Overall Auditory Comprehension: Impaired Commands: Impaired One Step Basic Commands: 75-100% accurate Two Step Basic Commands: 75-100% accurate Multistep Basic Commands: 50-74% accurate Conversation: Simple Interfering Components: Attention EffectiveTechniques: Extra processing time;Repetition Visual Recognition/Discrimination Discrimination: Not tested Reading Comprehension Reading Status: Not tested Expression Expression Primary Mode of Expression: Verbal Verbal Expression Overall Verbal Expression: Impaired Initiation: No impairment Repetition: No impairment Naming: No impairment Pragmatics: Impairment Impairments: Abnormal affect;Eye contact Interfering Components: Attention;Speech intelligibility Effective Techniques: Open ended questions Non-Verbal Means of Communication: Not applicable Written Expression Dominant Hand: Right Oral Motor Oral Motor/Sensory Function Overall Oral Motor/Sensory Function: Mild impairment Facial ROM: Reduced left Facial Symmetry: Abnormal symmetry left Facial Strength: Within Functional Limits Facial Sensation: Within Functional Limits Lingual ROM: Within Functional Limits Lingual Symmetry: Within Functional Limits Lingual Strength: Within Functional Limits Velum: Within Functional Limits Mandible: Within Functional Limits Motor Speech Overall Motor Speech: Impaired Respiration: Within functional limits Phonation: Normal Resonance: Within functional limits Articulation: Impaired Level of Impairment: Phrase Intelligibility: Intelligibility reduced Word: 75-100% accurate Phrase: 50-74% accurate Sentence: 50-74% accurate Motor Planning: Witnin functional limits Motor Speech Errors: Not applicable Effective Techniques: Slow rate  Care Tool Care Tool Cognition Expression of Ideas and Wants Expression of Ideas and Wants:  Frequent difficulty - frequently exhibits difficulty with expressing needs and ideas   Understanding Verbal and Non-Verbal Content Understanding Verbal and Non-Verbal Content: Sometimes understands - understands only basic conversations or simple, direct phrases. Frequently requires cues to understand   Memory/Recall Ability *first 3 days only Memory/Recall Ability *first 3 days only: That he or she is in a hospital/hospital unit;Current season     PMSV Assessment  PMSV Trial Intelligibility: Intelligibility reduced Word: 75-100% accurate Phrase: 50-74% accurate Sentence: 50-74% accurate  Bedside Swallowing Assessment General Date of Onset: 09/06/20 Previous Swallow Assessment: BSE Diet Prior to this Study: Dysphagia 2 (chopped);Thin liquids Temperature Spikes Noted: No Respiratory Status: Room air History of Recent Intubation: No Behavior/Cognition: Alert;Requires cueing Oral Cavity - Dentition: Adequate natural dentition Self-Feeding Abilities: Needs assist;Needs set up Patient Positioning: Upright in bed Baseline Vocal Quality: Normal Volitional Cough: Weak Volitional Swallow: Able to elicit  Oral Care Assessment   Ice Chips Ice chips: Not tested Thin Liquid Thin Liquid: Within functional limits Presentation: Straw;Cup Pharyngeal  Phase Impairments: Other (comments) (none) Nectar Thick   Honey Thick   Puree Puree: Within functional limits Solid Solid: Impaired Oral Phase Impairments: Reduced labial seal;Poor awareness of bolus Oral  Phase Functional Implications: Impaired mastication;Left anterior spillage;Right anterior spillage BSE Assessment Risk for Aspiration Impact on safety and function: Mild aspiration risk Other Related Risk Factors: Previous CVA;Lethargy;Cognitive impairment  Short Term Goals: Week 1: SLP Short Term Goal 1 (Week 1): Patient will tolerate trials of Dys 3 mechanical soft solids with minA for clearing oral residuals and mild anterior  spillage of solids. SLP Short Term Goal 2 (Week 1): Patient will sequence steps to perform basic level ADL task (brushing teeth, etc) with minA for accuracy. SLP Short Term Goal 3 (Week 1): Patient will achieve 85% intelligibility at sentence level with minA for speech strategies. SLP Short Term Goal 4 (Week 1): Patient will maintain topic during structured conversation with modA cues to redirect. SLP Short Term Goal 5 (Week 1): Patient will demonstrate emergent awareness by describing at least 2 deficits and thier impact on his function, with modA. SLP Short Term Goal 6 (Week 1): Patient will recall and restate basic level information from therapy/nursing therapy/interventions during the day with modA  Refer to [place] for Long Term Goals  Recommendations for other services: Neuropsych  Discharge Criteria: Patient will be discharged from SLP if patient refuses treatment 3 consecutive times without medical reason, if treatment goals not met, if there is a change in medical status, if patient makes no progress towards goals or if patient is discharged from hospital.  The above assessment, treatment plan, treatment alternatives and goals were discussed and mutually agreed upon: by patient  Sonia Baller, MA, CCC-SLP Speech Therapy

## 2020-09-16 NOTE — Evaluation (Signed)
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 No pertinent past medical history. Past Surgical History: History reviewed. No pertinent surgical history.  Assessment & Plan Clinical Impression  :Zachary Flores is a 54 year old right-handed male with history of hypertension,  as well as alcohol/tobacco use.  History taken from chart review and patient due to cognition.  Patient lives with brother.  Works in a Environmental [employer].  He uses a straight cane.  He presented on 09/06/2020 with left hemiparesis, dysarthria and diplopia.  Blood pressure was noted to be 150/99.  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  Severe chronic small vessel ischemic disease.  Admission chemistries glucose 188, SARS coronavirus negative urine drug screen positive cocaine.  Neurology follow-up conservative care monitoring of blood pressure.  Latest echocardiogram with ejection fraction of 55-60%, without emboli.  Patient did have episodes of agitation restlessness needing Precedex as well as CIWA protocol and currently maintained on Seroquel.  Hospital course further complicated by dysphagia, initially with nasogastric tube for nutritional support his diet has been advanced to a dysphagia #2 thin liquid. Patient transferred to CIR on 09/15/2020 .    Patient currently requires max with basic  self-care skills secondary to muscle weakness, decreased cardiorespiratoy endurance, impaired timing and sequencing, abnormal tone, motor apraxia, ataxia, decreased coordination and decreased motor planning, decreased visual acuity, decreased visual perceptual skills and decreased visual motor skills, decreased midline orientation, decreased attention to left, decreased motor planning and ideational apraxia, decreased initiation, decreased attention, decreased awareness, decreased problem solving, decreased safety awareness, decreased memory and delayed processing and decreased sitting balance, decreased standing balance, decreased postural control, hemiplegia and decreased balance strategies.  Prior to hospitalization, patient could complete BADLs and IADLs with independent .  Patient will benefit from skilled intervention to increase independence with basic self-care skills prior to discharge home with care partner.  Anticipate patient will require 24 hour supervision and minimal physical assistance and HHOT versus OPOT.  OT - End of Session Activity Tolerance: Tolerates 10 - 20 min activity with multiple rests Endurance Deficit: Yes Endurance Deficit Description: Brief rest breaks required for recovery OT Assessment Rehab Potential (ACUTE ONLY): Good OT Patient demonstrates impairments in the following area(s): Balance;Perception;Safety;Cognition;Sensory;Endurance;Vision;Motor OT Basic ADL's Functional Problem(s): Eating;Grooming;Bathing;Dressing;Toileting OT Transfers Functional Problem(s): Toilet;Tub/Shower OT Additional Impairment(s): Fuctional Use of Upper Extremity OT Plan OT Intensity: Minimum of 1-2 x/day, 45 to 90 minutes OT Frequency: 5 out of 7 days OT Duration/Estimated Length of Stay: 3 weeks OT Treatment/Interventions: Balance/vestibular training;Discharge planning;Self Care/advanced ADL retraining;Therapeutic Activities;UE/LE Coordination activities;Cognitive  remediation/compensation;Disease mangement/prevention;Functional mobility training;Patient/family education;Therapeutic Exercise;Visual/perceptual remediation/compensation;DME/adaptive equipment instruction;Neuromuscular re-education;Psychosocial support;UE/LE Strength taining/ROM;Wheelchair propulsion/positioning OT Self Feeding Anticipated Outcome(s): supervision OT Basic Self-Care Anticipated Outcome(s): Min assist OT Toileting Anticipated Outcome(s): min assist OT Bathroom Transfers Anticipated Outcome(s): min assist OT Recommendation Recommendations for Other Services: Therapeutic Recreation consult Therapeutic Recreation Interventions: Other (comment) (leisure) Patient destination: Home Follow Up Recommendations: Other (comment) (to be determined) Equipment Recommended:  To be determined   OT Evaluation Precautions/Restrictions  Precautions Precautions: Fall Precaution Comments: left sided weakness; monitor BP Restrictions Weight Bearing Restrictions: No General Chart Reviewed: Yes Pain Pain Assessment Pain Scale: 0-10 Pain Score: 0-No pain Home Living/Prior Functioning Home Living Family/patient expects to be discharged to:: Private residence Living Arrangements: Non-relatives/Friends Available Help at Discharge: Family, Available 24 hours/day Type of Home: Apartment Home Access: Level entry Home Layout: One level Bathroom Shower/Tub: Optometrist: Yes  Lives With: Family IADL History Occupation: Other (comment) (reports he worked at Environmental [employer]) Prior Function Level of Independence: Independent with basic ADLs, Independent with transfers, Independent with gait  Able to Take Stairs?: Yes Driving: Yes Vocation Requirements: [employer] Comments: reports he works at a [employer], 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; reports being mod independent using SPC but unsure of accuracy  Vision Baseline  Vision/History: No visual deficits Patient Visual Report: Diplopia;Eye fatigue/eye pain/headache;Blurring of vision Vision Assessment?: Yes Eye Alignment: Impaired (comment) Ocular Range of Motion: Restricted on the right;Restricted on the left;Restricted looking up;Restricted looking down Alignment/Gaze Preference: Head turned;Head tilt Tracking/Visual Pursuits: Right eye does not track medially;Right eye does not track laterally;Requires cues, head turns, or add eye shifts to track Saccades: Impaired - to be further tested in functional context;Decreased speed of saccadic movement Convergence: Impaired - to be further tested in functional context Visual Fields: No apparent deficits Diplopia Assessment: Other (comment) (difficult to assess due to impaired cognition; pt reports seeing double) Depth Perception: Overshoots;Undershoots Perception  Perception: Impaired Praxis Praxis: Impaired Praxis Impairment Details: Ideation;Initiation;Ideomotor;Motor planning Praxis-Other Comments: greater apraxia on right however also present on left. Cognition Overall Cognitive Status: No family/caregiver present to determine baseline cognitive functioning Arousal/Alertness: Awake/alert Orientation Level: Person;Place;Situation Person: Oriented Place: Oriented Situation: Oriented Year: 2021 Month: September Day of Week: Incorrect Memory: Impaired Memory Impairment: Storage deficit;Retrieval deficit;Decreased recall of new information;Decreased short term memory Decreased Short Term Memory: Verbal basic Immediate Memory Recall: Sock;Blue;Bed Memory Recall Sock: Not able to recall Memory Recall Blue: Without Cue Memory Recall Bed: Not able to recall Attention: Sustained;Selective;Focused Focused Attention: Impaired Focused Attention Impairment: Verbal basic;Functional basic Sustained Attention: Impaired Sustained Attention Impairment: Verbal basic;Functional basic Selective Attention:  Impaired Selective Attention Impairment: Verbal basic;Functional basic Awareness: Impaired Awareness Impairment: Emergent impairment Problem Solving: Impaired Problem Solving Impairment: Functional basic;Verbal basic Executive Function: Sequencing;Decision Making;Initiating;Self Monitoring Sequencing: Impaired Sequencing Impairment: Verbal basic;Functional basic Decision Making: Impaired Decision Making Impairment: Verbal basic;Functional basic Initiating: Impaired Initiating Impairment: Verbal basic;Functional basic Self Monitoring: Impaired Self Monitoring Impairment: Verbal basic;Functional basic Safety/Judgment: Impaired Sensation Sensation Light Touch: Impaired by gross assessment Hot/Cold: Appears Intact Proprioception: Impaired by gross assessment Coordination Gross Motor Movements are Fluid and Coordinated: No Fine Motor Movements are Fluid and Coordinated: No Motor  Motor Motor: Hemiplegia;Motor apraxia;Abnormal postural alignment and control;Abnormal tone Motor - Skilled Clinical Observations: LUE flexor synergy, L inattention, strong posterior bias in standing > seated, L HB weakness  Trunk/Postural Assessment  Cervical Assessment Cervical Assessment: Exceptions to [facility] (right rotation and right lateral flexion) Thoracic Assessment Thoracic Assessment: Exceptions to [facility] (mild kyphosis) Lumbar Assessment Lumbar Assessment: Exceptions to [facility] (posterior pelvic tilt) Postural Control Postural Control: Deficits on evaluation Head Control: right lateral flexion and rotation Righting Reactions: delayed; posterior and right lateral lean Protective Responses: delayed  Balance Balance Balance Assessed: Yes Static Sitting Balance Static Sitting - Balance Support: Feet supported;No upper extremity supported Static Sitting - Level of Assistance: 2: Max assist Dynamic Sitting Balance Dynamic Sitting -  Balance Support: During functional activity Dynamic Sitting - Level of  Assistance: 2: Max assist Sitting balance - Comments: Able to progress from initially requiring maxA to minA seated EOB. Close guarding necessary du eto extensor tone and retropulsion Static Standing Balance Static Standing - Balance Support: Bilateral upper extremity supported Static Standing - Level of Assistance: 2: Max assist Extremity/Trunk Assessment RUE Assessment RUE Assessment: Exceptions to [facility] General Strength Comments: Grossly 4-/5. Difficult to assess MMT due to apraxia and difficulty following commands LUE Assessment LUE Assessment: Exceptions to [facility] General Strength Comments: elbow flex 3+/5, elbow ext 3-/5, shldr flex 3/5  Care Tool Care Tool Self Care Eating   Eating Assist Level: Moderate Assistance - Patient 50 - 74%    Oral Care    Oral Care Assist Level: Moderate Assistance - Patient 50 - 74%    Bathing   Body parts bathed by patient: Chest;Face Body parts bathed by helper: Right arm;Left arm;Abdomen   Assist Level: Maximal Assistance - Patient 24 - 49%    Upper Body Dressing(including orthotics)   What is the patient wearing?: Pull over shirt   Assist Level: Maximal Assistance - Patient 25 - 49%    Lower Body Dressing (excluding footwear)   What is the patient wearing?: Underwear/pull up Assist for lower body dressing: Total Assistance - Patient < 25%    Putting on/Taking off footwear   What is the patient wearing?: Non-skid slipper socks Assist for footwear: Dependent - Patient 0%       Care Tool Toileting Toileting activity   Assist for toileting: Dependent - Patient 0%     Care Tool Bed Mobility Roll left and right activity   Roll left and right assist level: Maximal Assistance - Patient 25 - 49%    Sit to lying activity   Sit to lying assist level: Maximal Assistance - Patient 25 - 49%    Lying to sitting edge of bed activity   Lying to sitting edge of bed assist level: Maximal Assistance - Patient 25 - 49%     Care Tool  Transfers Sit to stand transfer   Sit to stand assist level: Maximal Assistance - Patient 25 - 49%    Chair/bed transfer   Chair/bed transfer assist level: Dependent - Patient 0%     Toilet transfer   Assist Level: 2 Helpers     Care Tool Cognition Expression of Ideas and Wants Expression of Ideas and Wants: Frequent difficulty - frequently exhibits difficulty with expressing needs and ideas   Understanding Verbal and Non-Verbal Content Understanding Verbal and Non-Verbal Content: Sometimes understands - understands only basic conversations or simple, direct phrases. Frequently requires cues to understand   Memory/Recall Ability *first 3 days only Memory/Recall Ability *first 3 days only: Current season;Location of own room;Staff names and faces;That he or she is in a hospital/hospital unit    Refer to Care Plan for [place] 1 OT Short Term Goal 1 (Week 1): Pt will complete UB dressing with mod assist OT Short Term Goal 2 (Week 1): Pt will complete UB bathing with min assist OT Short Term Goal 3 (Week 1): Pt will complete self feeding with min assist OT Short Term Goal 4 (Week 1): Pt will be able to stand with mod assist in preperation for LB ADLs  Recommendations for other services: Therapeutic Recreation  Other leisure   Skilled Therapeutic Intervention ADL ADL Eating: Moderate assistance Where Assessed-Eating: Wheelchair Grooming: Moderate assistance Where Assessed-Grooming:  Sitting at sink Upper Body Bathing: Maximal assistance;Maximal cueing Where Assessed-Upper Body Bathing: Sitting at sink Upper Body Dressing: Maximal assistance;Maximal cueing Where Assessed-Upper Body Dressing: Sitting at sink Mobility  Bed Mobility Bed Mobility: Rolling Right;Rolling Left;Supine to Sit Rolling Right: Maximal Assistance - Patient 25-49% Rolling Left: Maximal Assistance - Patient 25-49% Supine to Sit: Maximal Assistance - Patient - Patient  25-49% Transfers Sit to Stand: Maximal Assistance - Patient 25-49% Stand to Sit: Maximal Assistance - Patient 25-49%   Discharge Criteria: Patient will be discharged from OT if patient refuses treatment 3 consecutive times without medical reason, if treatment goals not met, if there is a change in medical status, if patient makes no progress towards goals or if patient is discharged from hospital.  The above assessment, treatment plan, treatment alternatives and goals were discussed and mutually agreed upon: by patient  Ezekiel Slocumb 09/16/2020, 4:42 PM

## 2020-09-16 NOTE — Progress Notes (Signed)
Patient Details  Name: Zachary Flores MRN: [id number] Date of Birth: 19-Oct-1966  Today's Date: 09/16/2020  Hospital Problems: Principal Problem:   Pontine hemorrhage [facility]) Active Problems:   Right pontine cerebrovascular accident [facility])  Past Medical History: History reviewed. No pertinent past medical history. Past Surgical History: History reviewed. No pertinent surgical history. Social History:  reports that he has been smoking. He has never used smokeless tobacco. He reports current alcohol use of about 24.0 standard drinks of alcohol per week. No history on file for drug use.  Family / Support Systems Marital Status: Divorced How Long?: years Patient Roles: Other (Comment), Parent (sibling) Children: Two children Fredric Dine will be visiting while here Other Supports: Hattie-sister [phone], [name]-sister [phone], Henry-brother [phone], Walter-brother [phone] Anticipated Caregiver: Sister's and brother's-no current plan in place Ability/Limitations of Caregiver: According to [name] they do not have plan currently. He will be going to Henry-Frank's home but he works and is on HD Caregiver Availability: Other (Comment) (Made aware will probably need 24/7 supervision or care at DC) Family Dynamics: Close with sibling and two children. he has always done what he wanted to do even if it was not the best choice for him. He is the youngest in the family and siblings have looked out for him all of his life.  Social History Preferred language: English Religion: Baptist Cultural Background: No issues Education: HS Read: Yes Write: Yes Employment Status: Unemployed Date Retired/Disabled/Unemployed: Worked at [employer] Issues: No issues Guardian/Conservator: None-according to MD pt is not fully capable of making his own decisions at this time. Will look toward his children and siblings due to no formal POA in place   Abuse/Neglect Abuse/Neglect  Assessment Can Be Completed: Yes Physical Abuse: Denies Verbal Abuse: Denies Sexual Abuse: Denies Exploitation of patient/patient's resources: Denies Self-Neglect: Denies  Emotional Status Pt's affect, behavior and adjustment status: Pt is confused and not oriented only to self, he thought he was at [facility]. He has always been independent even with weakness from his past CVA. He used a cane prior to admission. Recent Psychosocial Issues: other health issues and substance abuse issues.  Will not be going back to the person he lived with prior to admission according to [name] Psychiatric History: No history will ask neruo-psych to see once appropriate for substance abuse issues and for coping Substance Abuse History: ETOH and Cocaine-pt was positive for cocaine upon admission to the hospital. He has had issues for years according to [name]  Patient / Family Perceptions, Expectations & Goals Pt/Family understanding of illness & functional limitations: [name] can explain his stroke and deficits, she has spoken with the MD and feels she has a good understanding of his treatment plan going forward. Unsure pt's understanding at this time due to disorientation Premorbid pt/family roles/activities: Brother, father, friend, etc Anticipated changes in roles/activities/participation: resume Pt/family expectations/goals: [name] states: " We hope he does well there and makes good progress."  Pt states: " I'm tired."  [employer]: None Premorbid Home Care/DME Agencies: Other (Comment) (has a cane) Transportation available at discharge: Pt doesnt' drive-friend, siblings and son Resource referrals recommended: Neuropsychology  Discharge Planning Living Arrangements: Non-relatives/Friends Support Systems: Children, Other relatives Type of Residence: Private residence Insurance Resources: Self-pay (uninsured) Surveyor, quantity Resources: Other (Comment) (unemployed) Financial Screen  Referred: Yes ([name] to come MOnday to meet with financial counselor) Living Expenses: Other (Comment) (Friend-lived with) Money Management: Patient, Family Does the patient have any problems obtaining your medications?: Yes (Describe) (uninsured) Home  Management: Self and friend Patient/Family Preliminary Plans: Plan now to go to brother's-Henry/Frank family calls him [name]. [name] and [place] work, while [employer] lives in [place] and Zollie Beckers is retired. [name] also goes to HD [place],T,S. Asked if have come up with a plan and they have not. Made aware will probably need supervision at very least and they will need to discuss this. Care Coordinator Barriers to Discharge: Medication compliance, Other (comments), Decreased caregiver support Care Coordinator Barriers to Discharge Comments: Does not currently have 24hr care plan, is uninsured and has substance abuse issues Care Coordinator Anticipated Follow Up Needs: HH/OP, Other (comment) (Apply for SSD and medicaid)  Clinical Impression Confused pt who is here on rehab after suffering a CVA. He has two children and three siblings who will need to come up with a plan for discharge. He will ned to apply for SSD and medicaid while here and sister to be here Monday to talk with financial counselor to start the process. Pt has substance abuse issues and will refer to neuro-psych while here and when appropriate. According to [employer] pt has always been one who did what he wanted he is the baby of the family and they have always took care of him. He now may need physical care. Will work on safe plan for him. Aware team evaluating and setting goals for his rehab stay here.  Lucy Chris 09/16/2020, 12:20 PM

## 2020-09-16 NOTE — Progress Notes (Signed)
Zachary Arn, MD  Physician  Physical Medicine and Rehabilitation  PMR Pre-admission     Addendum  Date of Service:  09/15/2020 10:05 AM      Related encounter: ED to Hosp-Admission (Discharged) from 09/06/2020 in [facility]       Show:Clear all '[x]' Manual'[x]' Template'[x]' Copied  Added by: '[x]' Posey Pronto, Domenick Bookbinder, MD'[x]' Michel Santee, PT  '[]' Hover for details  PMR Admission Coordinator Pre-Admission Assessment  Patient: Zachary Flores is an 54 y.o., male MRN: [id number] DOB: [birth year] Height: '5\' 7"'  (170.2 cm) Weight: 77.7 kg                                                                                                                                                  Insurance Information HMO:     PPO:      PCP:      IPA:      80/20:      OTHER:  PRIMARY: Uninsured      Policy#:       Subscriber:  CM Name:       Phone#:      Fax#:  Pre-Cert#:       Employer:  Benefits:  Phone #:      Name:  Eff. Date:      Deduct:       Out of Pocket Max:       Life Max:   CIR:       SNF:  Outpatient:      Co-Pay:  Home Health:       Co-Pay:  DME:      Co-Pay:  Providers:  SECONDARY:       Policy#:       Phone#:   Development worker, community:       Phone#:   The Therapist, art Information Summary" for patients in Inpatient Rehabilitation Facilities with attached "Privacy Act [place] Records" was provided and verbally reviewed with: N/A  Emergency Contact Information Contact Information     Name Relation Home Work [place]  [phone]     Friedt,Walter Brother [phone]     Locus Tresten, Pantoja Sister Johnson, [place] Brother [phone]        Current Medical History  Patient Admitting Diagnosis: ICH  History of Present Illness: Zachary Flores is a 54 year old right-handed male with history of hypertension,as well as alcohol/tobaccouse. Per chart review lives  with brother. Works in a Environmental consultant. He uses a straight cane. Presented 09/06/2020 left side weakness and slurred speech as well as double vision. Blood pressure 150/99. 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 Admission chemistries glucose 188 SARS coronavirus negative urine drug screen positive cocaine. Neurology follow-up conservative care monitoring of blood  pressure. Latest echocardiogram with ejection fraction of 55 to 60% without emboli. Patient did have episodes of agitation restlessness needing Precedex as well as CIWAprotocol and currently maintained on Seroquel. Initially with nasogastric tube for nutritional support his diet has been advanced to a dysphagia #2 thin liquid. Therapy evaluations completed and patient was admitted for a comprehensive rehab program.  Complete NIHSS TOTAL: 8  Past Medical History  History reviewed. No pertinent past medical history.  Family History  Family history is unknown by patient.  Prior Rehab/Hospitalizations:  Has the patient had prior rehab or hospitalizations prior to admission? Yes  Has the patient had major surgery during 100 days prior to admission? No  Current Medications   Current Facility-Administered Medications:  .  acetaminophen (TYLENOL) tablet 650 mg, 650 mg, Oral, Q4H PRN, 650 mg at 09/13/20 2157 **OR** acetaminophen (TYLENOL) suppository 650 mg, 650 mg, Rectal, Q4H PRN, Annita Brod, MD .  chlorhexidine (PERIDEX) 0.12 % solution 15 mL, 15 mL, Mouth Rinse, BID, Biby, Sharon L, NP, 15 mL at 09/15/20 0851 .  Chlorhexidine Gluconate Cloth 2 % PADS 6 each, 6 each, Topical, Daily, Donzetta Starch, NP, 6 each at 09/15/20 0851 .  feeding supplement (ENSURE ENLIVE) (ENSURE ENLIVE) liquid 237 mL, 237 mL, Oral, TID BM, Bonnielee Haff, MD, 237 mL at 09/15/20 0851 .  folic acid (FOLVITE) tablet 1 mg, 1 mg, Oral,  Daily, Annita Brod, MD, 1 mg at 09/15/20 0852 .  influenza vac split quadrivalent PF (FLUARIX) injection 0.5 mL, 0.5 mL, Intramuscular, Tomorrow-1000, Krishnan, Sendil K, MD .  labetalol (NORMODYNE) injection 10-40 mg, 10-40 mg, Intravenous, Q10 min PRN, Biby, Sharon L, NP .  lisinopril (ZESTRIL) tablet 5 mg, 5 mg, Oral, Daily, Annita Brod, MD, 5 mg at 09/15/20 0852 .  MEDLINE mouth rinse, 15 mL, Mouth Rinse, q12n4p, Biby, Sharon L, NP, 15 mL at 09/14/20 1543 .  metoprolol tartrate (LOPRESSOR) tablet 25 mg, 25 mg, Oral, BID, Annita Brod, MD, 25 mg at 09/15/20 0852 .  multivitamin with minerals tablet 1 tablet, 1 tablet, Oral, Daily, Annita Brod, MD, 1 tablet at 09/15/20 [phone] .  pantoprazole (PROTONIX) EC tablet 40 mg, 40 mg, Oral, Daily, Annita Brod, MD, 40 mg at 09/15/20 0852 .  pneumococcal 23 valent vaccine (PNEUMOVAX-23) injection 0.5 mL, 0.5 mL, Intramuscular, Tomorrow-1000, [place] Pink, Sendil K, MD .  QUEtiapine (SEROQUEL) tablet 25 mg, 25 mg, Oral, QHS, Annita Brod, MD, 25 mg at 09/14/20 2220 .  senna-docusate (Senokot-S) tablet 1 tablet, 1 tablet, Oral, BID, Annita Brod, MD, 1 tablet at 09/15/20 [phone] .  thiamine tablet 100 mg, 100 mg, Oral, Daily, 100 mg at 09/15/20 9030 **OR** [DISCONTINUED] thiamine (B-1) injection 100 mg, 100 mg, Intravenous, Daily, Aroor, Lanice Schwab, MD, 100 mg at 09/08/20 0923  Patients Current Diet:  Diet Order             DIET DYS 2 Room service appropriate? 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  Independent  Indoor Mobility: Did the patient need assistance with walking from room to room (with or without device)? Independent  Stairs: Did the patient need assistance with internal or external stairs (with or without device)? Independent  Functional Cognition: Did the patient need help planning regular tasks such as shopping or remembering to take medications? Independent  Home Assistive Devices / Equipment Home Assistive Devices/Equipment: None Home Equipment: Cane - single point  Prior Device Use: Indicate devices/aids used by the patient prior to current illness, exacerbation or injury?  cane  Current Functional Level Cognition  Arousal/Alertness:  (fluctuating) Overall Cognitive Status: No family/caregiver present to determine baseline cognitive functioning Current Attention Level: Focused, Sustained Orientation Level: Oriented to person, Oriented to place, Oriented to time, Disoriented to situation Following Commands: Follows one step commands with increased time, Follows one step commands inconsistently Safety/Judgement: Decreased awareness of deficits, Decreased awareness of safety General Comments: Constant random movements of LUE (?alien arm) that settled down in left sidelying; this was distracting for pt Attention: Focused, Sustained Focused Attention: Impaired Focused Attention Impairment: Functional basic Sustained Attention: Impaired Sustained Attention Impairment: Verbal basic, Functional basic Memory: Impaired Memory Impairment: Storage deficit, Retrieval deficit, Decreased recall of new information, Decreased short term memory Decreased Short Term Memory: Verbal basic Awareness: Impaired Awareness  Impairment: Emergent impairment Problem Solving: Impaired Problem Solving Impairment: Functional basic, Verbal basic Safety/Judgment: Impaired    Extremity Assessment (includes Sensation/Coordination)  Upper Extremity Assessment: Difficult to assess due to impaired cognition, Generalized weakness, LUE deficits/detail LUE Deficits / Details: LUE noted grossly weaker than RUE, though does attempt to utilize for purposeful movements  LUE Coordination: decreased fine motor, decreased gross motor  Lower Extremity Assessment: Defer to PT evaluation LLE Deficits / Details: did not extend L LE in sitting, but in supine able to flex at hip/knee and moved L ankle, but limited compared to R difficult to fully assess due to cognition    ADLs  Overall ADL's : Needs assistance/impaired Eating/Feeding: NPO Grooming: Maximal assistance, Sitting, Wash/dry face Grooming Details (indicate cue type and reason): significant assist for sitting balance  EOB (maxA) with cues to initiate and perform basic ADL task General ADL Comments: pt requiring totalA for additional ADL at this time     Mobility  Overal bed mobility: Needs Assistance Bed Mobility: Rolling Rolling: Mod assist, Max assist (to each side x 2) Sidelying to sit: Max assist, HOB elevated, +2 for physical assistance Supine to sit: Max assist, +2 for physical assistance Sit to supine: Max assist, +2 for physical assistance Sit to sidelying: Mod assist, +2 for physical assistance General bed mobility comments: pt with inability to grasp rail with LUE (even with placing hand on rail); some difficulty grasping with rt hand when rolling to left; difficulty flexion LLE for rolling (pushes into extension); able to fully flex LLE in left sidelying    Transfers  Overall transfer level: Needs assistance Equipment used: 2 person hand held assist Transfers: Sit to/from Stand Sit to Stand: Mod assist General transfer comment: deferred due to no +2  assist     Ambulation / Gait / Stairs / Wheelchair Mobility  Ambulation/Gait General Gait Details: unable    Posture / Balance Dynamic Sitting Balance Sitting balance - Comments: able to progress from forward flexed position with max  input/support via blanket around hips/torso, to upright sitting without needing blanket with min assist; pt able to shift forward into flexion if cued to look at his socks and prevent posterior lean Balance Overall balance assessment: Needs assistance Sitting-balance support: Feet supported, Bilateral upper extremity supported Sitting balance-Leahy Scale: Poor Sitting balance - Comments: able to progress from forward flexed position with max input/support via blanket around hips/torso, to upright sitting without needing blanket with min assist; pt able to shift forward into flexion if cued to look at his socks and prevent posterior lean Postural control: Posterior lean, Right lateral lean Standing balance support: Bilateral upper extremity supported Standing balance-Leahy Scale: Poor    Special needs/care consideration Behavioral consideration has been restless and Designated visitor [place] (from acute therapy documentation) Living Arrangements: Other relatives (brother)  Lives With: Family Available Help at Discharge: Family, Available 24 hours/day Type of Home: Apartment Home Layout: One level Home Access: Level entry Bathroom Shower/Tub: Chiropodist: Standard Bathroom Accessibility: Yes How Accessible: Accessible via walker [place]: No Additional Comments: Patient unable to give details about home set up; does report he used a cane PTA  Discharge Living Setting Plans for Discharge Living Setting: Lives with (comment) (brother) Type of Home at Discharge: Apartment Discharge Home Layout: One level Discharge Home Access: Level entry Discharge Bathroom Shower/Tub:  Tub/shower unit Discharge Bathroom Toilet: Standard Discharge Bathroom Accessibility: Yes How Accessible: Accessible via walker Does the patient have any problems obtaining your medications?: Yes (Describe) (uninsured)  Social/Family/Support Systems Anticipated Caregiver: Sister Contractor and Valentina Gu), brothers Miquel Stacks and Leighton Ruff) Anticipated Caregiver's Contact Information: Marnette Burgess [phone]; Georga Kaufmann [phone] Jew [phone]; Mallie Mussel [phone] Ability/Limitations of Caregiver: min assist Caregiver Availability: 24/7 Discharge Plan Discussed with Primary Caregiver: Yes Is Caregiver In Agreement with Plan?: Yes Does Caregiver/Family have Issues with Lodging/Transportation while Pt is in Rehab?: No   Goals Patient/Family Goal for Rehab: PT/OT Min A, SLP min assist Expected length of stay: 16-18 days Pt/Family Agrees to Admission and willing to participate: Yes Program Orientation Provided & Reviewed with Pt/Caregiver Including Roles  & Responsibilities: Yes  Barriers to Discharge: Insurance for SNF coverage   Decrease burden of Care through IP rehab admission: n/a   Possible need for SNF placement upon discharge: not anticipated   Patient Condition: I have reviewed medical records from [facility], spoken with CM, and patient and family member. I met with patient at the bedside and discussed via phone for inpatient rehabilitation assessment.  Patient will benefit from ongoing PT, OT and SLP, can actively participate in 3 hours of therapy a day 5 days of the week, and can make measurable gains during the admission.  Patient will also benefit from the coordinated team approach during an Inpatient Acute Rehabilitation admission.  The patient will receive intensive therapy as well as Rehabilitation physician, nursing, social worker, and care management interventions.  Due to safety, skin/wound care, disease management, medication  administration, pain management and patient education the patient requires 24 hour a day rehabilitation nursing.  The patient is currently mod assist +2 with mobility and basic ADLs.  Discharge setting and therapy post discharge at  home  is anticipated.  Patient has agreed to participate in the Acute Inpatient Rehabilitation Program and will admit today.  Preadmission Screen Completed By:  Michel Santee, PT, DPT 09/15/2020 10:27 AM ______________________________________________________________________   Discussed status with Dr. Posey Pronto on 09/15/20 at 10:27 AM  and  received approval for admission today.  Admission Coordinator:  Michel Santee, PT, DPT time 10:27 AM Sudie Grumbling 09/15/20        Revision History                                    Note Details  Author Zachary Arn, MD File Time 09/15/2020 10:30 AM  Author Type Physician Status Addendum  Last Editor Zachary Arn, MD Service Physical Medicine and [place] # [id number] Admit Date 09/15/2020

## 2020-09-16 NOTE — Progress Notes (Signed)
Horton Chin, MD  Physician  Physical Medicine and Rehabilitation  Consult Note     Signed  Date of Service:  09/08/2020 2:40 PM      Related encounter: ED to Hosp-Admission (Discharged) from 09/06/2020 in [facility]      Signed      Expand All Collapse All  Show:Clear all [x] Manual[x] Template[] Copied  Added by: [x] Angiulli, , PA-C[x] , Mcarthur Rossetti, MD  [] Hover for details      Physical Medicine and Rehabilitation Consult Reason for Consult: Left side weakness and dysarthria Referring Physician: Carlis Abbott   HPI: Zachary Flores is a 54 y.o. right-handed male with history of hypertension as well as alcohol use. Per chart review patient lives with brother. Works at [employer]. He is a straight point cane. Presented 09/06/2020 left-sided weakness and slurred speech. 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 Severe chronic small vessel ischemic disease. Admission chemistries glucose 188, SARS, urine drug screen positive cocaine. Coronavirus negative. Neurology follow-up with conservative care monitoring of blood pressure. Patient n.p.o. with alternative means of nutritional support. Bouts of agitation and restlessness with restraints for safety. Therapy evaluations completed with recommendations of physical medicine rehab consult.   Review of Systems  Constitutional: Negative for fever.  HENT: Negative for hearing loss.   Eyes: Negative for blurred vision and double vision.  Respiratory: Negative for cough and shortness of breath.   Cardiovascular: Negative for chest pain and leg swelling.  Gastrointestinal: Positive for constipation. Negative for heartburn, nausea and vomiting.  Genitourinary: Negative for dysuria, flank pain and hematuria.  Musculoskeletal: Positive for myalgias.  Skin: Negative for rash.  Neurological: Positive for speech change and weakness.  All other  systems reviewed and are negative.  No past medical history on file. No past surgical history on file. Family History  Family history unknown: Yes   Social History:  reports that he has been smoking. He has never used smokeless tobacco. He reports current alcohol use of about 24.0 standard drinks of alcohol per week. No history on file for drug use. Allergies: No Known Allergies       Medications Prior to Admission  Medication Sig Dispense Refill  . lisinopril (ZESTRIL) 5 MG tablet Take 1 tablet (5 mg total) by mouth daily. 30 tablet 0  . metoprolol tartrate (LOPRESSOR) 25 MG tablet Take 1 tablet (25 mg total) by mouth 2 (two) times daily. 60 tablet 0    Home: Home Living Family/patient expects to be discharged to:: Private residence Living Arrangements: Other relatives (brother) Home Equipment: [employer] - single point Additional Comments: Patient unable to give details about home set up; does report he used a cane PTA  Functional History: Prior Function Comments: reports he works at a 09/08/2020, 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; reports being mod independent using SPC but unsure of accuracy  Functional Status:  Mobility: Bed Mobility Overal bed mobility: Needs Assistance Bed Mobility: Supine to Sit, Sit to Supine Supine to sit: +2 for physical assistance, Mod assist, HOB elevated Sit to supine: +2 for physical assistance, Max assist General bed mobility comments: patient initiating to sit up, but needed lifting help for the trunk and assist for L LE off bed Transfers General transfer comment: NT due to unsafe with poor sitting balance and BP initially out of parameters  ADL: ADL Overall ADL's : Needs assistance/impaired Eating/Feeding: NPO Grooming: Maximal assistance, Sitting, Wash/dry face Grooming Details (indicate cue type  and reason): significant assist for sitting balance  EOB (maxA) with cues to initiate and perform basic ADL task General ADL Comments: pt requiring  totalA for additional ADL at this time   Cognition: Cognition Overall Cognitive Status: Impaired/Different from baseline Arousal/Alertness:  (fluctuating) Orientation Level: Oriented to person, Disoriented to place, Disoriented to time, Disoriented to situation Attention: Focused, Sustained Focused Attention: Impaired Focused Attention Impairment: Functional basic Sustained Attention: Impaired Sustained Attention Impairment: Verbal basic, Functional basic Memory: Impaired Memory Impairment: Storage deficit, Retrieval deficit, Decreased recall of new information, Decreased short term memory Decreased Short Term Memory: Verbal basic Awareness: Impaired Awareness Impairment: Emergent impairment Problem Solving: Impaired Problem Solving Impairment: Functional basic, Verbal basic Safety/Judgment: Impaired Cognition Arousal/Alertness: Awake/alert Behavior During Therapy: Impulsive Overall Cognitive Status: Impaired/Different from baseline Area of Impairment: Orientation, Attention, Following commands, Problem solving, Safety/judgement Orientation Level: Disoriented to, Time, Place, Situation Current Attention Level: Focused Following Commands: Follows one step commands with increased time, Follows one step commands consistently Safety/Judgement: Decreased awareness of deficits, Decreased awareness of safety Problem Solving: Slow processing, Decreased initiation, Difficulty sequencing, Requires verbal cues, Requires tactile cues General Comments: fluctuated with orientation stating in hospital, then home, reported here due to shoulder issue, then stated he knew he had a stroke, reported year 1970's but then asking for a mask due to the "virus"  Blood pressure 117/80, pulse 89, temperature 98.9 F (37.2 C), temperature source Axillary, resp. rate (!) 24, weight 87 kg, SpO2 99 %. Physical Exam General: Alert, No apparent distress HEENT: Right eye exotropia, Nasogastric tube in  place Neck: Supple without JVD or lymphadenopathy Heart: Reg rate and rhythm. No murmurs rubs or gallops Chest: CTA bilaterally without wheezes, rales, or rhonchi; no distress Abdomen: Soft, non-tender, non-distended, bowel sounds positive. Extremities: left hand mitt in place Skin: Clean and intact without signs of breakdown Neuro: Patient is alert and restless. Makes eye contact with examiner. Provides his name and age. Dysarthric. Appears to have 4/5 strength throughout.  Psych: Pt's affect is pleasant. Pt is cooperative, very polite GU: foley in place    Lab Results Last 24 Hours       Results for orders placed or performed during the hospital encounter of 09/06/20 (from the past 24 hour(s))  Hemoglobin A1c     Status: Abnormal   Collection Time: 09/08/20  6:23 AM  Result Value Ref Range   Hgb A1c MFr Bld 5.9 (H) 4.8 - 5.6 %   Mean Plasma Glucose 122.63 mg/dL  Lipid panel     Status: Abnormal   Collection Time: 09/08/20  6:23 AM  Result Value Ref Range   Cholesterol 189 0 - 200 mg/dL   Triglycerides 633 (H) <150 mg/dL   HDL 58 >35 mg/dL   Total CHOL/HDL Ratio 3.3 RATIO   VLDL 31 0 - 40 mg/dL   LDL Cholesterol 456 (H) 0 - 99 mg/dL  Triglycerides     Status: Abnormal   Collection Time: 09/08/20  6:23 AM  Result Value Ref Range   Triglycerides 150 (H) <150 mg/dL      Imaging Results (Last 48 hours)  CT HEAD WO CONTRAST  Result Date: 09/07/2020 CLINICAL DATA:  Follow-up parenchymal hemorrhage EXAM: CT HEAD WITHOUT CONTRAST TECHNIQUE: Contiguous axial images were obtained from the base of the skull through the vertex without intravenous contrast. COMPARISON:  Head CT from yesterday FINDINGS: Brain: Upper brainstem hematoma which is best visualized on sagittal images for reproducible measurement, unchanged in size at 21 by 14 mm. No  visible adjacent brain edema. No visible intra ventricular extension. Extensive chronic small vessel ischemia in the cerebral white  matter for age. Chronic lacune at the right corona radiata. Vascular: No hyperdense vessel or unexpected calcification. Skull: Negative Sinuses/Orbits: Negative IMPRESSION: 1. Size stable upper brainstem hematoma. No intraventricular extension or hydrocephalus. 2. Extensive chronic small vessel ischemia. Electronically Signed   By: Marnee Spring M.D.   On: 09/07/2020 06:08      Assessment/Plan: Diagnosis: Central pontine hemorrhage 1. Does the need for close, 24 hr/day medical supervision in concert with the patient's rehab needs make it unreasonable for this patient to be served in a less intensive setting? Yes 2. Co-Morbidities requiring supervision/potential complications: hypertensive emergency, HLD, dysphagia requiring cortrak, ETOH use, UDS positive for cocaine, obesity (BMI 30.04) 3. Due to bladder management, bowel management, safety, skin/wound care, disease management, medication administration, pain management and patient education, does the patient require 24 hr/day rehab nursing? Yes 4. Does the patient require coordinated care of a physician, rehab nurse, therapy disciplines of PT, OT, SLP to address physical and functional deficits in the context of the above medical diagnosis(es)? Yes Addressing deficits in the following areas: balance, endurance, locomotion, strength, transferring, bowel/bladder control, bathing, dressing, feeding, grooming, toileting, cognition, speech, swallowing and psychosocial support 5. Can the patient actively participate in an intensive therapy program of at least 3 hrs of therapy per day at least 5 days per week? Yes 6. The potential for patient to make measurable gains while on inpatient rehab is good 7. Anticipated functional outcomes upon discharge from inpatient rehab are min assist  with PT, min assist with OT, min assist with SLP. 8. Estimated rehab length of stay to reach the above functional goals is: 2-3 weeks 9. Anticipated discharge  destination: Home 10. Overall Rehab/Functional Prognosis: good  RECOMMENDATIONS: This patient's condition is appropriate for continued rehabilitative care in the following setting: CIR Patient has agreed to participate in recommended program. Yes Note that insurance prior authorization may be required for reimbursement for recommended care.  Comment: Zachary Flores would be an excellent CIR candidate if 24/7 supervision can be confirmed. Thank you for this consult. Admission coordinator to follow.   I have personally performed a face to face diagnostic evaluation, including, but not limited to relevant history and physical exam findings, of this patient and developed relevant assessment and plan.  Additionally, I have reviewed and concur with the physician assistant's documentation above.  Sula Soda, MD  Charlton Amor, PA-C 09/08/2020        Revision History                     Routing History           Note Details  Author Carlis Abbott, Drema Pry, MD File Time 09/09/2020 2:18 PM  Author Type Physician Status Signed  Last Editor Horton Chin, MD Service Physical Medicine and Rehabilitation  Hospital Acct # [id number] Admit Date 09/15/2020

## 2020-09-16 NOTE — Discharge Instructions (Signed)
Inpatient Rehab Discharge Instructions  Clavin Ruhlman Discharge date and time: No discharge date for patient encounter.   Activities/Precautions/ Functional Status: Activity: activity as tolerated Diet:  Wound Care: Routine skin checks Functional status:  ___ No restrictions     ___ Walk up steps independently ___ 24/7 supervision/assistance   ___ Walk up steps with assistance ___ Intermittent supervision/assistance  ___ Bathe/dress independently ___ Walk with walker     _x__ Bathe/dress with assistance ___ Walk Independently    ___ Shower independently ___ Walk with assistance    ___ Shower with assistance ___ No alcohol     ___ Return to work/school ________  Special Instructions: No driving smoking or alcohol STROKE/TIA DISCHARGE INSTRUCTIONS SMOKING Cigarette smoking nearly doubles your risk of having a stroke & is the single most alterable risk factor  If you smoke or have smoked in the last 12 months, you are advised to quit smoking for your health.  Most of the excess cardiovascular risk related to smoking disappears within a year of stopping.  Ask you doctor about anti-smoking medications  [place] Quit Line: 1-800-QUIT NOW  Free Smoking Cessation Classes [phone]  CHOLESTEROL Know your levels; limit fat & cholesterol in your diet  Lipid Panel     Component Value Date/Time   CHOL 189 09/08/2020 0623   TRIG 156 (H) 09/08/2020 0623   TRIG 150 (H) 09/08/2020 0623   HDL 58 09/08/2020 0623   CHOLHDL 3.3 09/08/2020 0623   VLDL 31 09/08/2020 0623   LDLCALC 100 (H) 09/08/2020 4970      Many patients benefit from treatment even if their cholesterol is at goal.  Goal: Total Cholesterol (CHOL) less than 160  Goal:  Triglycerides (TRIG) less than 150  Goal:  HDL greater than 40  Goal:  LDL (LDLCALC) less than 100   BLOOD PRESSURE American Stroke Association blood pressure target is less that 120/80 mm/Hg  Your discharge blood pressure is:  BP: 102/73  Monitor your  blood pressure  Limit your salt and alcohol intake  Many individuals will require more than one medication for high blood pressure  DIABETES (A1c is a blood sugar average for last 3 months) Goal HGBA1c is under 7% (HBGA1c is blood sugar average for last 3 months)  Diabetes: No known diagnosis of diabetes    Lab Results  Component Value Date   HGBA1C 5.9 (H) 09/08/2020     Your HGBA1c can be lowered with medications, healthy diet, and exercise.  Check your blood sugar as directed by your physician  Call your physician if you experience unexplained or low blood sugars.  PHYSICAL ACTIVITY/REHABILITATION Goal is 30 minutes at least 4 days per week  Activity: Increase activity slowly, Therapies: Physical Therapy: Home Health Return to work:   Activity decreases your risk of heart attack and stroke and makes your heart stronger.  It helps control your weight and blood pressure; helps you relax and can improve your mood.  Participate in a regular exercise program.  Talk with your doctor about the best form of exercise for you (dancing, walking, swimming, cycling).  DIET/WEIGHT Goal is to maintain a healthy weight  Your discharge diet is:  Diet Order            DIET DYS 2 Room service appropriate? Yes with Assist; Fluid consistency: Thin  Diet effective now                 liquids Your height is:  Height: 5\' 7"  (170.2 cm)  Your current weight is: Weight: 74.3 kg Your Body Mass Index (BMI) is:  BMI (Calculated): 25.65  Following the type of diet specifically designed for you will help prevent another stroke.  Your goal weight range is:    Your goal Body Mass Index (BMI) is 19-24.  Healthy food habits can help reduce 3 risk factors for stroke:  High cholesterol, hypertension, and excess weight.  RESOURCES Stroke/Support Group:  Call [phone]   STROKE EDUCATION PROVIDED/REVIEWED AND GIVEN TO PATIENT Stroke warning signs and symptoms How to activate emergency medical system  (call 911). Medications prescribed at discharge. Need for follow-up after discharge. Personal risk factors for stroke. Pneumonia vaccine given:  Flu vaccine given:  My questions have been answered, the writing is legible, and I understand these instructions.  I will adhere to these goals & educational materials that have been provided to me after my discharge from the hospital.      My questions have been answered and I understand these instructions. I will adhere to these goals and the provided educational materials after my discharge from the hospital.  Patient/Caregiver Signature _______________________________ Date __________  Clinician Signature _______________________________________ Date __________  Please bring this form and your medication list with you to all your follow-up doctor's appointments.

## 2020-09-16 NOTE — Evaluation (Signed)
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 No pertinent past medical history. Past Surgical History: History reviewed. No pertinent surgical history.  Assessment & Plan Clinical Impression: Patient is a 54 year old right-handed male with history of hypertension,  as well as alcohol/tobacco use.  History taken from chart review and patient due to cognition.  Patient lives with brother.  Works in a Environmental consultant.  He uses a straight cane.  He presented on 09/06/2020 with left hemiparesis, dysarthria and diplopia.  Blood pressure was noted to be 150/99.  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  Severe chronic small vessel ischemic disease.  Admission chemistries glucose 188, SARS coronavirus negative urine drug screen positive cocaine.  Neurology follow-up conservative care monitoring of blood pressure.  Latest echocardiogram with ejection fraction of 55-60%, without emboli.  Patient did have episodes of agitation restlessness needing Precedex as well as CIWA protocol and currently maintained on Seroquel.  Hospital course further complicated by dysphagia, initially with nasogastric tube for nutritional support his diet has been advanced to a dysphagia #2 thin liquid.  Therapy evaluations completed and patient was admitted for a comprehensive  rehab program.   Patient currently requires max with mobility secondary to muscle weakness, impaired timing and sequencing, abnormal tone, unbalanced muscle activation, motor apraxia, decreased coordination and decreased motor planning, decreased visual acuity, decreased visual perceptual skills and decreased visual motor skills, decreased midline orientation, decreased attention to left, decreased motor planning and ideational apraxia and decreased initiation, decreased attention, decreased awareness, decreased problem solving, decreased safety awareness and delayed processing.  Prior to hospitalization, patient was modified independent  with mobility and lived with Family in a [place] home.  Home access is  Level entry.  Patient will benefit from skilled PT intervention to maximize safe functional mobility, minimize fall risk and decrease caregiver burden for planned discharge home with 24 hour assist.  Anticipate patient will benefit from follow up [facility] at discharge.  PT - End of Session Activity Tolerance: Tolerates 30+ min activity with multiple rests Endurance Deficit: Yes Endurance Deficit Description: Brief rest breaks required for recovery PT Assessment Rehab Potential (ACUTE/IP ONLY): Good PT Barriers to Discharge: Decreased caregiver support;Inaccessible home environment;Home environment access/layout;Lack of/limited family support;Insurance for SNF coverage PT Patient demonstrates impairments in the following area(s): Balance;Behavior;Endurance;Motor;Sensory;Safety;Perception PT Transfers Functional Problem(s): Bed Mobility;Bed to Chair;Car PT Locomotion Functional Problem(s): Ambulation;Wheelchair Mobility;Stairs PT Plan PT Intensity: Minimum of 1-2 x/day ,45 to 90 minutes PT Frequency: 5 out of 7 days PT Duration Estimated Length of Stay: 3 weeks PT Treatment/Interventions: Ambulation/gait training;Balance/vestibular training;Community reintegration;Cognitive  remediation/compensation;Discharge planning;Disease management/prevention;Functional electrical stimulation;DME/adaptive equipment instruction;Functional mobility training;Neuromuscular re-education;Patient/family education;Pain management;Psychosocial support;Skin care/wound management;Stair training;Splinting/orthotics;Therapeutic Activities;Therapeutic Exercise;UE/LE Coordination activities;UE/LE Strength taining/ROM;Visual/perceptual remediation/compensation;Wheelchair propulsion/positioning PT Transfers Anticipated Outcome(s): minA with LRAD PT Locomotion Anticipated Outcome(s): minA with LRAD PT Recommendation Recommendations for Other Services: Therapeutic Recreation consult Therapeutic Recreation Interventions: Stress management Follow Up Recommendations: 24 hour supervision/assistance;Home health PT Patient destination:  Home Equipment Recommended: To be determined   PT Evaluation Precautions/Restrictions Precautions Precautions: Fall Restrictions Weight Bearing Restrictions: No General Chart Reviewed: Yes Additional Pertinent History: polysubstance abuse (alcohol, cocaine), TIA, CVA, HTN Family/Caregiver Present: No Vital Signs  Pain Pain Assessment Pain Scale: 0-10 Pain Score: 0-No pain Home Living/Prior Functioning Home Living Living Arrangements: Non-relatives/Friends Available Help at Discharge: Family;Available 24 hours/day Type of Home: Apartment Home Access: Level entry Home Layout: One level Bathroom Shower/Tub: Tub/shower unit  Lives With: Family Prior Function Level of Independence: Independent with basic ADLs;Independent with transfers;Independent with gait  Able to Take Stairs?: Yes Driving: Yes 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 Unable to correctly identify number of digits therapist holding in several visual fields, providing  inconsistent responses Perception Perception: Impaired Praxis Praxis: Impaired Praxis Impairment Details: Ideation;Initiation;Ideomotor;Motor planning Praxis-Other Comments: Delayed and inconsistent. Primarily in LHB however noted generalized apraxia as well  Cognition Overall Cognitive Status: No family/caregiver present to determine baseline cognitive functioning Arousal/Alertness: Awake/alert (Flucutating. Lethargy with mobility) Orientation Level: Oriented to person;Oriented to place;Disoriented to situation;Disoriented to time;Other (comment) (Reports "february" for time and "seizures" for situation) Attention: Sustained;Selective;Focused Focused Attention: Impaired Focused Attention Impairment: Verbal basic;Functional basic Sustained Attention: Impaired Sustained Attention Impairment: Verbal basic;Functional basic Selective Attention: Impaired Selective Attention Impairment: Verbal basic;Functional basic Awareness: Impaired Problem Solving: Impaired Problem Solving Impairment: Functional basic;Verbal basic Safety/Judgment: Impaired Sensation Sensation Light Touch: Impaired by gross assessment Proprioception: Impaired by gross assessment Coordination Gross Motor Movements are Fluid and Coordinated: No Fine Motor Movements are Fluid and Coordinated: No Finger Nose Finger Test: overshooting with RUE, undershooting with LUE Motor  Motor Motor: Hemiplegia;Motor apraxia;Abnormal postural alignment and control;Abnormal tone Motor - Skilled Clinical Observations: LUE flexor synergy, L inattention, strong posterior bias in standing > seated, L HB weakness   Trunk/Postural Assessment  Cervical Assessment Cervical Assessment: Exceptions to [facility] (R rotation and L lateral flexion) Thoracic Assessment Thoracic Assessment: Exceptions to [facility] (mild kyphosis) Lumbar Assessment Lumbar Assessment: Exceptions to [facility] (posterior pelvic tilt, sacral sitting) Postural Control Postural Control:  Deficits on evaluation (L inattention, posterior and R lateral lean.)  Balance Balance Balance Assessed: Yes Static Sitting Balance Static Sitting - Balance Support: Feet supported;No upper extremity supported Static Sitting - Level of Assistance: 2: Max assist Dynamic Sitting Balance Dynamic Sitting - Balance Support: During functional activity Dynamic Sitting - Level of Assistance: 2: Max assist Sitting balance - Comments: Able to progress from initially requiring maxA to minA seated EOB. Close guarding necessary du eto extensor tone and retropulsion Static Standing Balance Static Standing - Balance Support: Bilateral upper extremity supported Static Standing - Level of Assistance: 2: Max assist Extremity Assessment  RUE Assessment RUE Assessment: Exceptions to [facility] General Strength Comments: Grossly 4-/5. Difficult to assess MMT due to apraxia and difficulty following commands LUE Assessment LUE Assessment: Exceptions to [facility] General Strength Comments: elbow flex 3+/5, elbow ext 3-/5, shldr flex 3/5 RLE Assessment RLE Assessment: Exceptions to [facility] General Strength Comments: Grossly 4-/5 LLE Assessment LLE Assessment: Exceptions to [facility] General Strength Comments: ankle DF 3-/5, knee ext 4-/5, hip flex 2/5  Care Tool Care Tool Bed Mobility Roll left and right activity   Roll left and right assist level: Maximal Assistance - Patient 25 - 49%    Sit to lying activity   Sit to lying assist level: Maximal Assistance - Patient 25 - 49%    Lying to sitting edge of  bed activity   Lying to sitting edge of bed assist level: Maximal Assistance - Patient 25 - 49%     Care Tool Transfers Sit to stand transfer   Sit to stand assist level: Maximal Assistance - Patient 25 - 49%    Chair/bed transfer   Chair/bed transfer assist level: Dependent - Patient 0% Customer service manager)     Psychologist, counselling transfer activity did not occur: Safety/medical concerns        Care Tool  Locomotion Ambulation Ambulation activity did not occur: Safety/medical concerns        Walk 10 feet activity Walk 10 feet activity did not occur: Safety/medical concerns       Walk 50 feet with 2 turns activity Walk 50 feet with 2 turns activity did not occur: Safety/medical concerns      Walk 150 feet activity Walk 150 feet activity did not occur: Safety/medical concerns      Walk 10 feet on uneven surfaces activity Walk 10 feet on uneven surfaces activity did not occur: Safety/medical concerns      Stairs Stair activity did not occur: Safety/medical concerns        Walk up/down 1 step activity Walk up/down 1 step or curb (drop down) activity did not occur: Safety/medical concerns     Walk up/down 4 steps activity did not occuR: Safety/medical concerns  Walk up/down 4 steps activity      Walk up/down 12 steps activity Walk up/down 12 steps activity did not occur: Safety/medical concerns      Pick up small objects from floor Pick up small object from the floor (from standing position) activity did not occur: Safety/medical concerns      Wheelchair Will patient use wheelchair at discharge?: Yes Type of Wheelchair: Manual Wheelchair activity did not occur: Safety/medical concerns      Wheel 50 feet with 2 turns activity      Wheel 150 feet activity        Refer to Care Plan for Long Term Goals  SHORT TERM GOAL WEEK 1 PT Short Term Goal 1 (Week 1): Pt will perform bed mobility with modA PT Short Term Goal 2 (Week 1): Pt will maintain unsupported sitting EOB >5 min with modA PT Short Term Goal 3 (Week 1): Pt will perform bed<>chair transfers with modA and LRAD PT Short Term Goal 4 (Week 1): Pt will initiate pre-gait training with modA and LRAD  Recommendations for other services: Therapeutic Recreation  Stress management  Skilled Therapeutic Intervention Mobility Bed Mobility Bed Mobility: Rolling Right;Rolling Left;Supine to Sit Rolling Right: Maximal  Assistance - Patient 25-49% Rolling Left: Maximal Assistance - Patient 25-49% Supine to Sit: Maximal Assistance - Patient - Patient 25-49% Transfers Transfers: Sit to Stand;Stand to Sit;Lateral/Scoot Transfers;Transfer via Geophysicist/field seismologist Sit to Stand: Maximal Assistance - Patient 25-49% Stand to Sit: Maximal Assistance - Patient 25-49% Lateral/Scoot Transfers: Maximal Assistance - Patient 25-49% Transfer (Assistive device): Other (Comment) (Stedy + sliding board) Transfer via Lift Equipment: Animal nutritionist: No Gait Gait: No Stairs / Additional Locomotion Stairs: No Architect: No  Skilled Intervention: 1st session: Pt received supine in bed, sleeping, awakens easily to voice and agreeable to PT session. Denies pain. Donned pants while supine with maxA and shirt with modA while seated. Rolling L<>R with maxA with HOB flat and no bed features. Supine<>sit with maxA via log rolling technique, assist for BLE and trunk management. Initially required  maxA for sitting balance, progressing to modA due to posterior lean. x1 sit<>stand with maxA and B knee block via face-to-face technique from EOB height; unable to reach full standing 2/2 strong posterior lean. Performed 1x10 sit<>stands in [place] from perched position with modA, cues for forward lean, BUE hand placement, and general sequencing. Pt able to maintain standing but strong posterior bias requiring modA for correction. Transfer in stedy to TIS w/c. WC transport to main therapy gym and wheeled inside // bars. Performed x2 sit<>stands in // bars with mod/maxA and B knee block; unable to achieve full upright erect posture 2/2 posterior bias. Unable to progress pre-gait 2/2 inability to achieve full standing. WC transport back to his room where he remained seated in w/c with belt alarm on, needs in reach, pillow supporting LUE.   2nd session: Pt received sitting in TIS w/c, sleeping on arrival but  awakens easily to voice, agreeable to PT session. Denies pain. WC transport with totalA from room to main therapy gym for time management. Performed sliding board transfer with maxA from w/c to mat table, VC's for technique/sequencing and BUE placement. Required modA for initial sitting EOM due to posterior lean, progressing to CGA/minA with RUE supported on mat table. Mirror placed in front for visual feedback and performed neuro re-ed sitting balance focusing on midline orientation and targeted reaching with RUE in several directions including across midline, forward, upward, and laterally. Noted onset of fatigue with prolonged sitting tasks, requiring brief rest breaks throughout. PT handoff of care to another PT at end of session while he remained seated.   Instructed pt in results of PT evaluation as detailed above, PT POC, rehab potential, rehab goals, and discharge recommendations. Additionally discussed CIR's policies regarding fall safety and use of chair alarm and/or quick release belt. Pt verbalized understanding and in agreement. Will update pt's family members as they become available.   Discharge Criteria: Patient will be discharged from PT if patient refuses treatment 3 consecutive times without medical reason, if treatment goals not met, if there is a change in medical status, if patient makes no progress towards goals or if patient is discharged from hospital.  The above assessment, treatment plan, treatment alternatives and goals were discussed and mutually agreed upon: by patient  Alger Simons PT, DPT 09/16/2020, 12:48 PM

## 2020-09-16 NOTE — Progress Notes (Signed)
Physical Therapy Session Note  Patient Details  Name: Zachary Flores MRN: [id number] Date of Birth: 16-Feb-1966  Today's Date: 09/16/2020 PT Individual Time: 1006-1046 PT Individual Time Calculation (min): 40 min   Short Term Goals: Week 1:  PT Short Term Goal 1 (Week 1): Pt will perform bed mobility with modA PT Short Term Goal 2 (Week 1): Pt will maintain unsupported sitting EOB >5 min with modA PT Short Term Goal 3 (Week 1): Pt will perform bed<>chair transfers with modA and LRAD PT Short Term Goal 4 (Week 1): Pt will initiate pre-gait training with modA and LRAD  Skilled Therapeutic Interventions/Progress Updates:    Pt received sitting EOM as hand-off from [place], PT, and pt agreeable to continue therapy session. Participated in sit<>stands using stedy and standing balance in stedy with mirror feedback for midline orientation. Sit>stand EOM>stedy with min assist progressed to mod/max assist at end of session due to fatigue - pt also demos increased difficulty coming to stand from higher stedy seat due to impaired motor planning and worsening posterior lean/push making it difficulty to lift hips from perched stedy seat. Standing in stedy working on upright tolerance in standing or perched position once fatiguing with min/mod assist for balance/trunk control due to either R posterior or L lateral trunk lean - progressed to attempting L UE reaching and L visual scanning task to grasp horseshoe with pt demoing significant L UE coordination, visual, and sensory impairments with pt getting his L arm caught on the stedy bar and unaware of it occurring and required cuing to problem solve how to move arm around the object, pt also unable to locate horseshoe with L hand missing the target (difficult to determine how much this was impacted by visual impairments). Pt becomes sweaty during this task so attempted to take BP in sitting: BP 136/110 (MAP 120), HR 81bpm but pt unable to keep R arm relaxed  during. Pt now requiring max assist for trunk control sitting EOM without R UE support due to R posterior LOB. Due to significant fatigue and pt requiring increased assist for mobility performed stedy transfer to TIS w/c. Transported back to room in w/c. Reassessed vitals in sitting: BP 103/75 (MAP 85), HR 68bpm Stedy transfer w/c>EOB with max assist for lifting into standing and max assist for trunk control on stedy due to strong L lateral trunk lean. Sit>supine with mod assist for trunk descent and B LE management into bed. Reassessed vitals: BP 100/76 (MAP 85), HR 66bpm  Pt left supine in bed with needs in reach, bed alarm on, and pt demos how to use call button without cuing.  Therapy Documentation Precautions:  Restrictions Weight Bearing Restrictions: No  Pain:   No reports of pain throughout session.   Therapy/Group: Individual Therapy  Ginny Forth , PT, DPT, CSRS  09/16/2020, 7:54 AM

## 2020-09-16 NOTE — Progress Notes (Signed)
[place] PHYSICAL MEDICINE & REHABILITATION PROGRESS NOTE   Subjective/Complaints: No complaints this morning. Denies pain, constipation, insomnia.   ROS: Denies pain, constipation, insomnia.   Objective:   No results found. No results for input(s): WBC, HGB, HCT, PLT in the last 72 hours. Recent Labs    09/15/20 0428  NA 139  K 4.5  CL 103  CO2 26  GLUCOSE 102*  BUN 17  CREATININE 0.97  CALCIUM 9.6    Intake/Output Summary (Last 24 hours) at 09/16/2020 1101 Last data filed at 09/16/2020 0830 Gross per 24 hour  Intake 160 ml  Output 325 ml  Net -165 ml        Physical Exam: Vital Signs Blood pressure 105/68, pulse 73, temperature 98.4 F (36.9 C), resp. rate 20, height 5\' 7"  (1.702 m), weight 74.3 kg, SpO2 95 %.  General: Alert and oriented x 3, No apparent distress HEENT: Head is normocephalic, atraumatic, PERRLA, EOMI, sclera anicteric, oral mucosa pink and moist, dentition intact, ext ear canals clear,  Neck: Supple without JVD or lymphadenopathy Heart: Reg rate and rhythm. No murmurs rubs or gallops Chest: CTA bilaterally without wheezes, rales, or rhonchi; no distress Abdomen: Soft, non-tender, non-distended, bowel sounds positive. Extremities: No clubbing, cyanosis, or edema. Pulses are 2+ Skin: Clean and intact without signs of breakdown Neurological:     Mental Status: He is alert.     Comments: Alert and oriented x2 Dysarthria Follows simple commands.   Limited awareness of deficits but he did state weakness of his left side. Motor: RUE: 4+/5 proximal distal RLE: 4/5 proximal distal LUE: 4/5 proximal distal LLE: 3+/5 proximal distal  Psychiatric:        Speech: Speech is delayed and slurred.        Cognition and Memory: Cognition is impaired.     Assessment/Plan: 1. 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  Physiatrist is  providing close team supervision and 24 hour management of active medical problems listed below.  Physiatrist and rehab team continue to assess barriers to discharge/monitor patient progress toward functional and medical goals  Care Tool:  Bathing              Bathing assist       Upper Body Dressing/Undressing Upper body dressing   What is the patient wearing?: Hospital gown only    Upper body assist Assist Level: Moderate Assistance - Patient 50 - 74%    Lower Body Dressing/Undressing Lower body dressing      What is the patient wearing?: Hospital gown only     Lower body assist Assist for lower body dressing: Moderate Assistance - Patient 50 - 74%     Toileting Toileting    Toileting assist Assist for toileting: Dependent - Patient 0%     Transfers Chair/bed transfer  Transfers assist     Chair/bed transfer assist level: Dependent - Patient 0% )     Locomotion Ambulation   Ambulation assist   Ambulation activity did not occur: Safety/medical concerns          Walk 10 feet activity   Assist  Walk 10 feet activity did not occur: Safety/medical concerns        Walk 50 feet activity   Assist Walk 50 feet with 2 turns activity did not occur: Safety/medical concerns         Walk 150 feet activity   Assist Walk 150  feet activity did not occur: Safety/medical concerns         Walk 10 feet on uneven surface  activity   Assist Walk 10 feet on uneven surfaces activity did not occur: Safety/medical concerns         Wheelchair     Assist Will patient use wheelchair at discharge?: Yes Type of Wheelchair: Manual Wheelchair activity did not occur: Safety/medical concerns         Wheelchair 50 feet with 2 turns activity    Assist            Wheelchair 150 feet activity     Assist          Blood pressure 105/68, pulse 73, temperature 98.4 F (36.9 C), resp. rate 20, height 5\' 7"  (1.702 m),  weight 74.3 kg, SpO2 95 %.    Medical Problem List and Plan: 1.  Left side hemiparesis and slurred speech secondary to central pontine hemorrhage in the setting of hypertension and cocaine use             -patient may shower             -ELOS/Goals: 16-19 days/supervision/min a            Initial CIR evals today 2.  Antithrombotics: -DVT/anticoagulation: SCDs             -antiplatelet therapy: N/A 3. Pain Management: Tylenol as needed. Well controlled.  4. Mood: Advised emotional support             -antipsychotic agents: Seroquel 25 mg nightly. Discuss with patient weaning to 12.5mg .  5. Neuropsych: This patient is ?  Fully capable of making decisions on his own behalf. 6. Skin/Wound Care: Routine skin checks 7. Fluids/Electrolytes/Nutrition: Routine in and outs.   8.  Post stroke dysphagia.  Dysphagia #2, thin liquids.  Follow-up speech therapy 9.  Hypertension.  Lopressor 25 mg twice daily, lisinopril 5 mg daily.    9/23: decrease lopressor to 2.5mg              Monitor with increased mobility 10.  Polysubstance abuse-alcohol, tobacco, cocaine abuse.  Monitor for any signs of withdrawal.  Provide counseling 11. Urinary retention: voiding trial   LOS: 1 days A FACE TO FACE EVALUATION WAS PERFORMED  Jannifer Fischler P Isiaha Greenup 09/16/2020, 11:01 AM

## 2020-09-17 ENCOUNTER — Inpatient Hospital Stay (HOSPITAL_COMMUNITY): Payer: Self-pay

## 2020-09-17 ENCOUNTER — Inpatient Hospital Stay (HOSPITAL_COMMUNITY): Payer: Self-pay | Admitting: Occupational Therapy

## 2020-09-17 ENCOUNTER — Inpatient Hospital Stay (HOSPITAL_COMMUNITY): Payer: Self-pay | Admitting: Speech Pathology

## 2020-09-17 MED ORDER — QUETIAPINE FUMARATE 25 MG PO TABS
12.5000 mg | ORAL_TABLET | Freq: Every day | ORAL | Status: DC
  Administered 2020-09-17 – 2020-09-19 (×3): 12.5 mg via ORAL
  Filled 2020-09-17 (×3): qty 1

## 2020-09-17 NOTE — Progress Notes (Signed)
Incontinent of large volume of urine ,no noted redness or sediments

## 2020-09-17 NOTE — Progress Notes (Signed)
[facility] PHYSICAL MEDICINE & REHABILITATION PROGRESS NOTE   Subjective/Complaints: No complaints this morning. Denies pain, constipation, insomnia. RN informed me that he was orthostatic today in therapy. When asked, patient did report dizziness during session- teds and abdominal binder were applied.   ROS: Denies pain, constipation, insomnia.   Objective:   No results found. Recent Labs    09/16/20 1111  WBC 6.7  HGB 14.9  HCT 47.4  PLT 468*   Recent Labs    09/15/20 0428 09/16/20 1111  NA 139 143  K 4.5 4.1  CL 103 106  CO2 26 27  GLUCOSE 102* 108*  BUN 17 17  CREATININE 0.97 1.02  CALCIUM 9.6 9.5    Intake/Output Summary (Last 24 hours) at 09/17/2020 1057 Last data filed at 09/17/2020 0745 Gross per 24 hour  Intake 1290 ml  Output 1050 ml  Net 240 ml        Physical Exam: Vital Signs Blood pressure 101/80, pulse 71, temperature 98.6 F (37 C), resp. rate 20, height 5\' 7"  (1.702 m), weight 74.3 kg, SpO2 100 %.   General: Alert, No apparent distress HEENT: Head is normocephalic, atraumatic, PERRLA, EOMI, sclera anicteric, oral mucosa pink and moist, dentition intact, ext ear canals clear,  Neck: Supple without JVD or lymphadenopathy Heart: Reg rate and rhythm. No murmurs rubs or gallops Chest: CTA bilaterally without wheezes, rales, or rhonchi; no distress Abdomen: Soft, non-tender, non-distended, bowel sounds positive. Extremities: No clubbing, cyanosis, or edema. Pulses are 2+ Skin: Skin abraisons Neurological:     Mental Status: He is alert.     Comments: Alert and oriented x2 Dysarthria Follows simple commands.   Limited awareness of deficits but he did state weakness of his left side. Motor: RUE: 4+/5 proximal distal RLE: 4/5 proximal distal LUE: 4/5 proximal distal LLE: 3+/5 proximal distal  Psychiatric:        Speech: Speech is delayed and slurred.        Cognition and Memory: Cognition is impaired.     Assessment/Plan: 1.  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  Physiatrist is providing close team supervision and 24 hour management of active medical problems listed below.  Physiatrist and rehab team continue to assess barriers to discharge/monitor patient progress toward functional and medical goals  Care Tool:  Bathing    Body parts bathed by patient: Chest, Face   Body parts bathed by helper: Right arm, Left arm, Abdomen     Bathing assist Assist Level: Maximal Assistance - Patient 24 - 49%     Upper Body Dressing/Undressing Upper body dressing   What is the patient wearing?: Hospital gown only    Upper body assist Assist Level: Minimal Assistance - Patient > 75%    Lower Body Dressing/Undressing Lower body dressing      What is the patient wearing?: Underwear/pull up     Lower body assist Assist for lower body dressing: Total Assistance - Patient < 25%     Toileting Toileting    Toileting assist Assist for toileting: Total Assistance - Patient < 25%     Transfers Chair/bed transfer  Transfers assist     Chair/bed transfer assist level: Dependent - mechanical lift (stedy)     Locomotion Ambulation   Ambulation assist   Ambulation activity did not occur: Safety/medical concerns          Walk 10 feet activity   Assist  Walk 10 feet activity did not occur: Safety/medical concerns        Walk 50 feet activity   Assist Walk 50 feet with 2 turns activity did not occur: Safety/medical concerns         Walk 150 feet activity   Assist Walk 150 feet activity did not occur: Safety/medical concerns         Walk 10 feet on uneven surface  activity   Assist Walk 10 feet on uneven surfaces activity did not occur: Safety/medical concerns         Wheelchair     Assist Will patient use wheelchair at discharge?: Yes Type of  Wheelchair: Manual Wheelchair activity did not occur: Safety/medical concerns         Wheelchair 50 feet with 2 turns activity    Assist            Wheelchair 150 feet activity     Assist          Blood pressure 101/80, pulse 71, temperature 98.6 F (37 C), resp. rate 20, height 5\' 7"  (1.702 m), weight 74.3 kg, SpO2 100 %.    Medical Problem List and Plan: 1.  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  Antithrombotics: -DVT/anticoagulation: SCDs             -antiplatelet therapy: N/A 3. Pain Management: Tylenol as needed. Well controlled  4. Mood: Advised emotional support             -antipsychotic agents: Seroquel 25 mg nightly.  9/24: wean to 12.5mg  5. Neuropsych: This patient is ?  Fully capable of making decisions on his own behalf. 6. Skin/Wound Care: Routine skin checks 7. Fluids/Electrolytes/Nutrition: Routine in and outs.   8.  Post stroke dysphagia.  Dysphagia #2, thin liquids.  Follow-up speech therapy 9.  Hypertension.  Lopressor 25 mg twice daily, lisinopril 5 mg daily.    9/24: Bp soft and with orthostasis w/ dizziness in therapy. Stop Lisinopril.              Monitor with increased mobility 10.  Polysubstance abuse-alcohol, tobacco, cocaine abuse.  Monitor for any signs of withdrawal.  Provide counseling 11. Urinary retention: voiding trial   LOS: 2 days A FACE TO FACE EVALUATION WAS PERFORMED  10/24 Stirling Orton 09/17/2020, 10:57 AM

## 2020-09-17 NOTE — Progress Notes (Signed)
Occupational Therapy Session Note  Patient Details  Name: Zachary Flores MRN: [id number] Date of Birth: 18-Sep-1966  Today's Date: 09/17/2020 OT Individual Time: 1255-1406 OT Individual Time Calculation (min): 71 min   Short Term Goals: Week 1:  OT Short Term Goal 1 (Week 1): Pt will complete UB dressing with mod assist OT Short Term Goal 2 (Week 1): Pt will complete UB bathing with min assist OT Short Term Goal 3 (Week 1): Pt will complete self feeding with min assist OT Short Term Goal 4 (Week 1): Pt will be able to stand with mod assist in preperation for LB ADLs  Skilled Therapeutic Interventions/Progress Updates:    Pt greeted in bed with no c/o pain, requesting to eat his lunch. Per consultation with SLP, provided pt with an eye patch due to possible diplopia. Difficult to assess effectiveness of patch due to cognitive deficits, however pt reported he liked it. Supine<sit completed with Mod A and vcs after OT positioned the Rt hand on the bedrail. He initially presented with posterior lean/LOBs, needing Max A to correct fading to pt requiring CGA ~50% of the time with cuing for leaning forward while eating lunch. Pt able to discern his meat and that his other food was "orange" (carrots and mac n' cheese). Vcs for slowing pace due to moderate spillage, vcs for using one of his left digits for preventing spillage over plate when he was attempting to scoop food. He may benefit from a plate guard. HOH to use his Lt hand to stabilize magic cup, pt visibly crushing cup without assistance from OT regarding how much pressure to apply. Sit<stand in [place] completed with CGA. Vcs for neutral midline due to Lt lean while semi perched at sink. Continued working on trunk control/postural awareness while engaging in hand washing and face washing. Used the mirror for visual feedback when he leaned/pushed Lt. Mod A for dynamic balance when he was standing in device. Pt then reported he had to have a BM. Stedy  transfer completed to the toilet where pt voided bowels. He needed close supervision for sitting balance when voiding due to restlessness/impulsive movement. OT assisted with toileting tasks with Max A at times for offsetting Lt lean/push when standing in device. He wanted to sit up for a bit, transferred pt to the TIS with vcs for neutral midline. Pt doffed his soiled shirt and donned a clean one. Note that pt threaded his Rt arm into shirt and had his head threaded through the left sleeve hole, when asked if his shirt was on correctly, pt stated "yeah." Max A needed to don shirt correctly. Pt with dysmetric Lt UE movements throughout session though pt attempting to use his affected limb, proprioception deficits with Lt UE also, I.e. attempting to pull shirt down over trunk in spaces ~6 inches away from trunk. Pt was reclined for comfort and safety in TIS and was then escorted to RN station. Left him with safety belt fastened.    Therapy Documentation Precautions:  Precautions Precautions: Fall Precaution Comments: left sided weakness; monitor BP Restrictions Weight Bearing Restrictions: No ADL: ADL Eating: Moderate assistance Where Assessed-Eating: Wheelchair Grooming: Moderate assistance Where Assessed-Grooming: Sitting at sink Upper Body Bathing: Maximal assistance, Maximal cueing Where Assessed-Upper Body Bathing: Sitting at sink Upper Body Dressing: Maximal assistance, Maximal cueing Where Assessed-Upper Body Dressing: Sitting at sink      Therapy/Group: Individual Therapy  Brannon Decaire A Rewa Weissberg 09/17/2020, 4:10 PM

## 2020-09-17 NOTE — IPOC Note (Signed)
Overall Plan of Care [facility]) Patient Details Name: Zachary Flores MRN: [id number] DOB: Apr 12, 1966  Admitting Diagnosis: Pontine hemorrhage [facility])  Hospital Problems: Principal Problem:   Pontine hemorrhage (HCC) Active Problems:   Right pontine cerebrovascular accident [facility])     Functional Problem List: Nursing Bowel, Bladder, Endurance, Medication Management, Nutrition, Pain, Safety, Skin Integrity  PT Balance, Behavior, Endurance, Motor, Sensory, Safety, Perception  OT Balance, Perception, Safety, Cognition, Sensory, Endurance, Vision, Motor  SLP Behavior, Cognition, Linguistic, Safety, Nutrition  TR         Basic ADL's: OT Eating, Grooming, Bathing, Dressing, Toileting     Advanced  ADL's: OT       Transfers: PT Bed Mobility, Bed to Chair, Customer service manager, Tub/Shower     Locomotion: PT Ambulation, Psychologist, prison and probation services, Stairs     Additional Impairments: OT Fuctional Use of Upper Extremity  SLP Social Cognition, Swallowing, Communication expression, comprehension Social Interaction, Problem Solving, Memory, Awareness, Attention  TR      Anticipated Outcomes Item Anticipated Outcome  Self Feeding supervision  Swallowing  supervision Dys 3, thin liquids   Basic self-care  Min assist  Toileting  min assist   Bathroom Transfers min assist  Bowel/Bladder  patient will manage bowel and bladder with Mod assistance  Transfers  minA with LRAD  Locomotion  minA with LRAD  Communication  supervision sentence-conversational level  Cognition  supervision basic problem solving  Pain  pain less than 3 on 0-10 pain scale  Safety/Judgment  Mod assist and follow safety plan   Therapy Plan: PT Intensity: Minimum of 1-2 x/day ,45 to 90 minutes PT Frequency: 5 out of 7 days PT Duration Estimated Length of Stay: 3 weeks OT Intensity: Minimum of 1-2 x/day, 45 to 90 minutes OT Frequency: 5 out of 7 days OT Duration/Estimated Length of Stay: 3 weeks SLP Intensity:  Minumum of 1-2 x/day, 30 to 90 minutes SLP Frequency: 3 to 5 out of 7 days SLP Duration/Estimated Length of Stay: 3 weeks   Due to the current state of emergency, patients may not be receiving their 3-hours of Medicare-mandated therapy.   Team Interventions: Nursing Interventions Patient/Family Education, Bladder Management, Bowel Management, Disease Management/Prevention, Pain Management, Medication Management, Skin Care/Wound Management, Cognitive Remediation/Compensation, Dysphagia/Aspiration Precaution Training, Discharge Planning, Psychosocial Support  PT interventions Ambulation/gait training, Warden/ranger, Community reintegration, Cognitive remediation/compensation, Discharge planning, Disease management/prevention, Functional electrical stimulation, DME/adaptive equipment instruction, Functional mobility training, Neuromuscular re-education, Patient/family education, Pain management, Psychosocial support, Skin care/wound management, Stair training, Splinting/orthotics, Therapeutic Activities, Therapeutic Exercise, UE/LE Coordination activities, UE/LE Strength taining/ROM, Visual/perceptual remediation/compensation, Wheelchair propulsion/positioning  OT Interventions Warden/ranger, Discharge planning, Self Care/advanced ADL retraining, Therapeutic Activities, UE/LE Coordination activities, Cognitive remediation/compensation, Disease mangement/prevention, Functional mobility training, Patient/family education, Therapeutic Exercise, Visual/perceptual remediation/compensation, DME/adaptive equipment instruction, Neuromuscular re-education, Psychosocial support, UE/LE Strength taining/ROM, Wheelchair propulsion/positioning  SLP Interventions Cognitive remediation/compensation, Internal/external aids, Functional tasks, Patient/family education, Speech/Language facilitation, Therapeutic Activities, Medication managment, Dysphagia/aspiration precaution training  TR  Interventions    SW/CM Interventions Discharge Planning, Psychosocial Support, Patient/Family Education   Barriers to Discharge MD  Medical stability  Nursing      PT Decreased caregiver support, Inaccessible home environment, Home environment access/layout, Lack of/limited family support, Insurance for SNF coverage    OT      SLP      SW Medication compliance, Other (comments), Decreased caregiver support Does not currently have 24hr care plan, is uninsured and has substance abuse issues   Team Discharge Planning: Destination: PT-Home ,OT- Home ,  SLP-Home Projected Follow-up: PT-24 hour supervision/assistance, Home health PT, OT-  Other (comment) (to be determined), SLP-Home Health SLP, 24 hour supervision/assistance Projected Equipment Needs: PT-To be determined, OT- To be determined, SLP-None recommended by SLP Equipment Details: PT- , OT-  Patient/family involved in discharge planning: PT- Patient unable/family or caregiver not available, Patient,  OT-Patient, SLP-Patient  MD ELOS: 16-19 days/supervision/min a Medical Rehab Prognosis:  Excellent Assessment: [name] is a 54 year old man who is admitted to CIR with left side hemiparesis and slurred speech secondary to central pontine hemorrhage in the setting of hypertension and cocaine use. Pain is well controlled. Orthostasis has been managed with reduction in blood pressure medications. Voiding trial has been initiated. Seroquel is being weaned. He continues on dysphagia 2 diet.    See Team Conference Notes for weekly updates to the plan of care

## 2020-09-17 NOTE — Progress Notes (Signed)
Speech Language Pathology Daily Session Note  Patient Details  Name: Zachary Flores MRN: [id number] Date of Birth: 03/10/1966  Today's Date: 09/17/2020 SLP Individual Time: 1000-1100 SLP Individual Time Calculation (min): 60 min  Short Term Goals: Week 1: SLP Short Term Goal 1 (Week 1): Patient will tolerate trials of Dys 3 mechanical soft solids with minA for clearing oral residuals and mild anterior spillage of solids. SLP Short Term Goal 2 (Week 1): Patient will sequence steps to perform basic level ADL task (brushing teeth, etc) with minA for accuracy. SLP Short Term Goal 3 (Week 1): Patient will achieve 85% intelligibility at sentence level with minA for speech strategies. SLP Short Term Goal 4 (Week 1): Patient will maintain topic during structured conversation with modA cues to redirect. SLP Short Term Goal 5 (Week 1): Patient will demonstrate emergent awareness by describing at least 2 deficits and thier impact on his function, with modA. SLP Short Term Goal 6 (Week 1): Patient will recall and restate basic level information from therapy/nursing therapy/interventions during the day with modA  Skilled Therapeutic Interventions:   Patient seen for skilled ST session focusing on dysphagia and speech-language, cognitive goals. Patient able to masticate graham cracker with reduced amount of anterior spillage as compared to previous session, however he did exhibit coughing episode. Majority of residuals in oral cavity cleared after sips of thin liquids and then fully cleared with oral care; SLP setup patient at sink and he was able to use toothbrush to brush teeth, but required supervision and modA to perform. Patient completed cognitive task of putting together pattern using pvc pipes. He started to squint his left eye when looking at shapes and did seem to have better depth perception, etc when doing so. He endorsed double vision. Patient continues with motor apraxia and requires maxA cues to  coordinate both hands to complete tasks (holding pudding while using spoon to take bite, etc.). Patient was 80-85% intelligible during structured speech at sentence level with supervision. Patient maintained topic during structured conversation led by SLP, with min-modA. Patient continues to benefit from skilled STP intervention to maximize speech, language, cognitive and swallow function prior to discharge.  Pain Pain Assessment Pain Scale: 0-10 Pain Score: 0-No pain  Therapy/Group: Individual Therapy  Angela Nevin, MA, CCC-SLP 09/17/20 4:14 PM

## 2020-09-17 NOTE — Progress Notes (Signed)
Physical Therapy Session Note  Patient Details  Name: Zachary Flores MRN: [id number] Date of Birth: 03/17/1966  Today's Date: 09/17/2020 PT Individual Time: 0800-0900 PT Individual Time Calculation (min): 60 min   Short Term Goals: Week 1:  PT Short Term Goal 1 (Week 1): Pt will perform bed mobility with modA PT Short Term Goal 2 (Week 1): Pt will maintain unsupported sitting EOB >5 min with modA PT Short Term Goal 3 (Week 1): Pt will perform bed<>chair transfers with modA and LRAD PT Short Term Goal 4 (Week 1): Pt will initiate pre-gait training with modA and LRAD  Skilled Therapeutic Interventions/Progress Updates:    Pt received supine in bed, agreeable to PT session. Reports unrated LUE pain contributed to numbness from stroke. Rest breaks, mobility, and desensitizing techniques provided for pain management. Donned pants with mod/maxA while supine, cues for bridging where he was able to fully clear buttock. Donned B TED's with totalA while supine in bed for time management. Supine<>sit with mod/maxA with HOB flat via log rolling technique, cues for step-by-step sequencing. Initially requiring maxA for sitting balance, progressing to minA. Doffed t-shirt with minA and donned a clean t-shirt with mod/maxA with cues for threading technique with LUE. Sit<>stand with min/modA from EOB to [facility], required assist for LUE hand placement and cues for widening his grip. Demo's strong posterior lean in standing with stedy and had difficulty correcting with multi-modal cueing. Wheeled in [place] to brush his teeth. He brushed his teeth in standing while requiring modA for standing balance and using his RUE to brush. Pt with RUE dyscoordination and accidentally missing his mouth a few times, brushing the sides of his outside cheek. Stedy transfer to TIS w/c with totalA and WC transport to main therapy gym, wheeled inside // bars. Performed repeated sit<>stands in // bars with minA and mirrors both in  front and to the L for visual feedback to assist with both midline orientation and to reduce posterior lean. Pt aware of R lateral/posterior lean and able to correct with cues. Required manual assist for B foot placement as he has tendency to show narrow BOS. VC for reducing weight through heels and attempting to stand in an "athletic stance" with weight through forefoot (this helped significantly with standing balance). With prolonged standing trials, he reports becoming dizzy and lightheaded. BP assessed with results below. WC transport back to his room where he remained seated in TIS w/c, belt alarm on, needs in reach. RN notified at end of session regarding BP drop with positional change.  Seated: 143/106 (116) HR 99 Standing: 118/91 (100) HR 118 *Pt reports ++ lightheadedness   Therapy Documentation Precautions:  Precautions Precautions: Fall Precaution Comments: left sided weakness; monitor BP Restrictions Weight Bearing Restrictions: No  Therapy/Group: Individual Therapy  Corlene Sabia P Alfreddie Consalvo PT 09/17/2020, 9:26 AM

## 2020-09-17 NOTE — Progress Notes (Signed)
Received call from sister [name]) and provided support, patient informed

## 2020-09-18 ENCOUNTER — Inpatient Hospital Stay (HOSPITAL_COMMUNITY): Payer: Self-pay | Admitting: Physical Therapy

## 2020-09-18 ENCOUNTER — Inpatient Hospital Stay (HOSPITAL_COMMUNITY): Payer: Self-pay | Admitting: Occupational Therapy

## 2020-09-18 ENCOUNTER — Encounter (HOSPITAL_COMMUNITY): Payer: Self-pay | Admitting: Occupational Therapy

## 2020-09-18 ENCOUNTER — Inpatient Hospital Stay (HOSPITAL_COMMUNITY): Payer: Self-pay

## 2020-09-18 DIAGNOSIS — I10 Essential (primary) hypertension: Secondary | ICD-10-CM

## 2020-09-18 DIAGNOSIS — R471 Dysarthria and anarthria: Secondary | ICD-10-CM

## 2020-09-18 NOTE — Progress Notes (Signed)
Occupational Therapy Session Note  Patient Details  Name: Zachary Flores MRN: [id number] Date of Birth: November 15, 1966  Today's Date: 09/18/2020 OT Individual Time: 1962-2297 OT Individual Time Calculation (min): 48 min    Short Term Goals: Week 1:  OT Short Term Goal 1 (Week 1): Pt will complete UB dressing with mod assist OT Short Term Goal 2 (Week 1): Pt will complete UB bathing with min assist OT Short Term Goal 3 (Week 1): Pt will complete self feeding with min assist OT Short Term Goal 4 (Week 1): Pt will be able to stand with mod assist in preperation for LB ADLs  Skilled Therapeutic Interventions/Progress Updates:    Pt worked on bathing and dressing sit to stand at the sink during session.  Max assist was needed for supine to sit as well as squat pivot transfer to the wheelchair   He needed max hand over hand assist throughout session to integrate the RUE.  Pt with increased motor planning difficulty when attempting to grasp the washcloth with the LUE and attempt wring it out or wash with it.  He needed mod assist for UB bathing in supported sitting with max assist for LB bathing sit to stand.  He needed max assist for all dressing sit to stand as well with decreased ability to sequence donning the clothing secondary to apraxia.  He was able to complete oral hygiene in sitting with setup of toothbrush with toothpaste on it.  Finished session with pt in the tilt in space wheelchair per his choice and with the safety belt in place.  Soft touch call button and phone in reach with eye patch on the left eye.   Therapy Documentation Precautions:  Precautions Precautions: Fall Precaution Comments: left sided weakness; monitor BP Restrictions Weight Bearing Restrictions: No   Pain: Pain Assessment Pain Scale: Faces Pain Score: 0-No pain ADL: See Care Tool Section for some details of mobility and selfcare  Therapy/Group: Individual Therapy  Annakate Soulier OTR/L 09/18/2020, 3:55 PM

## 2020-09-18 NOTE — Plan of Care (Signed)
  Problem: RH BOWEL ELIMINATION Goal: RH STG MANAGE BOWEL WITH ASSISTANCE Description: STG Manage Bowel with Assistance. Outcome: Not Progressing; LBM9/22; laxatives   Problem: RH BLADDER ELIMINATION Goal: RH STG MANAGE BLADDER WITH ASSISTANCE Description: STG Manage Bladder With Assistance Patient will manage bladder with mod assistance. Outcome: Not Progressing; incontinence

## 2020-09-18 NOTE — Progress Notes (Signed)
Physical Therapy Session Note  Patient Details  Name: Zachary Flores MRN: [id number] Date of Birth: 12-31-1965  Today's Date: 09/18/2020 PT Individual Time: 1305-1402 PT Individual Time Calculation (min): 57 min   Short Term Goals: Week 1:  PT Short Term Goal 1 (Week 1): Pt will perform bed mobility with modA PT Short Term Goal 2 (Week 1): Pt will maintain unsupported sitting EOB >5 min with modA PT Short Term Goal 3 (Week 1): Pt will perform bed<>chair transfers with modA and LRAD PT Short Term Goal 4 (Week 1): Pt will initiate pre-gait training with modA and LRAD  Skilled Therapeutic Interventions/Progress Updates:    Pt received supine in bed and agreeable to therapy session. Supine>sitting R EOB, HOB partially elevated and bedrail available, with mod assist for trunk upright and max assist for scooting hips towards EOB. Required mod assist initially in sitting to prevent R posterior LOB but once scooted to edge able to maintain balance with CGA. L squat pivot EOB>w/c with +2 mod assist for lifting/pivoting hips - had pt reach for armrest with L hand to promote increased anterior trunk lean/weight shift.  Transported to/from gym in w/c for time management and energy conservation. Assessed vitals: BP 107/71 (MAP 84), HR 70bpm Donned B LE TED hose in preparation for standing/ambulating. Sit<>stands in // bars with min/mod assist for lifting and balance due to posterior lean - cuing for bringing hips forward and trunk upright - transitioned to having pt place hands on therapist's shoulders (assist for L hand placement) as opposed to the bars and pt demos improved forward weight shift. Assessed vitals in standing: BP 137/91 (MAP 99), HR 70bpm, unsure if pt able to relax R arm while supporting himself in standing therefore reassessed in sitting: BP 107/83 (MAP 91), HR 68bpm   Gait training 71ft, 81ft (seated break between) with +2 mod/max skilled PT assist and 3rd person for w/c follow, R HHA and L  UE over therapist's shoulders - demos strong R posterior lean and very narrow/scissoring steps with increased L LE knee flexed throughout (no buckling and therapist not blocking) assist for increased L LE step width (pt has excessive L LE external rotation as well) and cuing for "1,2" stepping sequence (as pt having difficulty with "right, left" cue) with goal of more controlled stepping and increased step lengths as pt just shuffling forward initially causing worsening posterior lean. Assessed vitals after 1st walk: BP 114/77 (MAP 90), HR 65bpm. Transported back to room and pt requesting to return to bed and rest prior to next therapy session. R squat pivot w/c>EOB using R UE support on bedrail with +2 mod assist for lifting/pivoting hips. Sit>supine with mod assist for trunk descent and B LE management into bed. Pt left supine in bed with needs in reach and bed alarm on.  Therapy Documentation Precautions:  Precautions Precautions: Fall Precaution Comments: left sided weakness; monitor BP Restrictions Weight Bearing Restrictions: No  Pain:   No reports of pain throughout session.   Therapy/Group: Individual Therapy  Ginny Forth , PT, DPT, CSRS  09/18/2020, 12:19 PM

## 2020-09-18 NOTE — Progress Notes (Signed)
Speech Language Pathology Daily Session Note  Patient Details  Name: Zachary Flores MRN: [id number] Date of Birth: [birth year]  Today's Date: 09/18/2020 SLP Individual Time: 0950-1030 SLP Individual Time Calculation (min): 40 min  Short Term Goals: Week 1: SLP Short Term Goal 1 (Week 1): Patient will tolerate trials of Dys 3 mechanical soft solids with minA for clearing oral residuals and mild anterior spillage of solids. SLP Short Term Goal 2 (Week 1): Patient will sequence steps to perform basic level ADL task (brushing teeth, etc) with minA for accuracy. SLP Short Term Goal 3 (Week 1): Patient will achieve 85% intelligibility at sentence level with minA for speech strategies. SLP Short Term Goal 4 (Week 1): Patient will maintain topic during structured conversation with modA cues to redirect. SLP Short Term Goal 5 (Week 1): Patient will demonstrate emergent awareness by describing at least 2 deficits and thier impact on his function, with modA. SLP Short Term Goal 6 (Week 1): Patient will recall and restate basic level information from therapy/nursing therapy/interventions during the day with modA  Skilled Therapeutic Interventions:Skilled ST services focused on speech and cognitive skills. SLP facilitated basic problem solving skills in function ADL task brushing teeth, with set up pt required mod A verbal cues for sequential problem solving. Pt demonstrated increase ability to maintain topic in structured task for 2-3 exchanges mod I, and in unstructured conversation min A verbal cues. SLP educated pt on speech intelligibility strategies to slow rate, pause between every 2-3 words and over articulate. Pt was able to demonstrate strategies in the creation of an initial sentences given a 3 syllable word with 85% intelligibility. However carryover of strategies quickly decreased as well as intelligibility level in the subsequent sentences/expressed thoughts due to increase fast rate and impreices  articulation resulting in 70% intelligibility. Pt was left in room with call bell within reach and bed alarm set. SLP recommends to continue skilled services.       Pain Pain Assessment Pain Score: 0-No pain  Therapy/Group: Individual Therapy  Shereta Crothers  [facility] 09/18/2020, 12:29 PM

## 2020-09-18 NOTE — Progress Notes (Signed)
Occupational Therapy Session Note  Patient Details  Name: Zachary Flores MRN: [id number] Date of Birth: Mar 16, 1966  Today's Date: 09/18/2020 OT Group Time: 1100-1130 OT Group Time Calculation (min): 30 min  30 minutes missed  Skilled Therapeutic Interventions/Progress Updates:    Pt engaged in therapeutic w/c level dance group focusing on patient choice, UE/LE strengthening, salience, activity tolerance, and social participation. Pt was guided through various dance-based exercises involving UEs/LEs and trunk. All music was selected by group members. Emphasis placed on praxis and Lt sided coordination. Pt required tactile and HOH cuing to increase involvement of Lt side, unable to visually mimic exercises of others in group. He did sing out loud to multiple familiar songs. Pt requested to leave group early due to fatigue, per NT, who escorted him back to room. Pt sleeping soundly in his TIS when checked on afterwards. Time missed due to fatigue.    Therapy Documentation Precautions:  Precautions Precautions: Fall Precaution Comments: left sided weakness; monitor BP Restrictions Weight Bearing Restrictions: No Pain: no s/s pain during tx Pain Assessment Pain Score: 0-No pain ADL: ADL Eating: Moderate assistance Where Assessed-Eating: Wheelchair Grooming: Moderate assistance Where Assessed-Grooming: Sitting at sink Upper Body Bathing: Maximal assistance, Maximal cueing Where Assessed-Upper Body Bathing: Sitting at sink Upper Body Dressing: Maximal assistance, Maximal cueing Where Assessed-Upper Body Dressing: Sitting at sink      Therapy/Group: Group Therapy  Azora Bonzo A Jason Hauge 09/18/2020, 12:48 PM

## 2020-09-18 NOTE — Progress Notes (Signed)
[place] PHYSICAL MEDICINE & REHABILITATION PROGRESS NOTE   Subjective/Complaints: Lying in bed. No new complaints. Denies pain. Said that bp's were better yesterday  ROS: Patient denies fever, rash, sore throat, blurred vision, nausea, vomiting, diarrhea, cough, shortness of breath or chest pain, joint or back pain, headache, or mood change.    Objective:   No results found. Recent Labs    09/16/20 1111  WBC 6.7  HGB 14.9  HCT 47.4  PLT 468*   Recent Labs    09/16/20 1111  NA 143  K 4.1  CL 106  CO2 27  GLUCOSE 108*  BUN 17  CREATININE 1.02  CALCIUM 9.5    Intake/Output Summary (Last 24 hours) at 09/18/2020 1204 Last data filed at 09/18/2020 0800 Gross per 24 hour  Intake 1290 ml  Output 1661 ml  Net -371 ml        Physical Exam: Vital Signs Blood pressure 111/75, pulse 65, temperature 98.4 F (36.9 C), resp. rate 19, height 5\' 7"  (1.702 m), weight 74.7 kg, SpO2 100 %.   Constitutional: No distress . Vital signs reviewed. HEENT: EOMI, oral membranes moist Neck: supple Cardiovascular: RRR without murmur. No JVD    Respiratory/Chest: CTA Bilaterally without wheezes or rales. Normal effort    GI/Abdomen: BS +, non-tender, non-distended Ext: no clubbing, cyanosis, or edema Psych: pleasant and cooperative Skin: Skin abraisons Neurological:     Mental Status: He is alert.     Comments: Alert and oriented x2, impaired stm Very dysarthric speech Follows simple commands.   Limited awareness of deficits but he did state weakness of his left side. Motor: RUE: 4+/5 proximal distal RLE: 4/5 proximal distal LUE: 4/5 proximal distal LLE: 3+/5 proximal distal        Assessment/Plan: 1. 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  Physiatrist is providing close team supervision and 24 hour management of active medical problems listed  below.  Physiatrist and rehab team continue to assess barriers to discharge/monitor patient progress toward functional and medical goals  Care Tool:  Bathing    Body parts bathed by patient: Chest, Face   Body parts bathed by helper: Right arm, Left arm, Abdomen     Bathing assist Assist Level: Maximal Assistance - Patient 24 - 49%     Upper Body Dressing/Undressing Upper body dressing   What is the patient wearing?: Pull over shirt    Upper body assist Assist Level: Maximal Assistance - Patient 25 - 49%    Lower Body Dressing/Undressing Lower body dressing      What is the patient wearing?: Pants, Incontinence brief     Lower body assist Assist for lower body dressing: Total Assistance - Patient < 25%     Toileting Toileting    Toileting assist Assist for toileting: Dependent - Patient 0%     Transfers Chair/bed transfer  Transfers assist     Chair/bed transfer assist level: Dependent - mechanical lift (stedy)     Locomotion Ambulation   Ambulation assist   Ambulation activity did not occur: Safety/medical concerns          Walk 10 feet activity   Assist  Walk 10 feet activity did not occur: Safety/medical concerns        Walk 50 feet activity   Assist Walk 50 feet with 2 turns activity did not occur: Safety/medical concerns         Walk  150 feet activity   Assist Walk 150 feet activity did not occur: Safety/medical concerns         Walk 10 feet on uneven surface  activity   Assist Walk 10 feet on uneven surfaces activity did not occur: Safety/medical concerns         Wheelchair     Assist Will patient use wheelchair at discharge?: Yes Type of Wheelchair: Manual Wheelchair activity did not occur: Safety/medical concerns         Wheelchair 50 feet with 2 turns activity    Assist            Wheelchair 150 feet activity     Assist          Blood pressure 111/75, pulse 65, temperature 98.4  F (36.9 C), resp. rate 19, height 5\' 7"  (1.702 m), weight 74.7 kg, SpO2 100 %.    Medical Problem List and Plan: 1.  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  Antithrombotics: -DVT/anticoagulation: SCDs             -antiplatelet therapy: N/A 3. Pain Management: Tylenol as needed. Well controlled  4. Mood: Advised emotional support             -antipsychotic agents: Seroquel 25 mg nightly.  9/24: wean to 12.5mg --did well with this dose 5. Neuropsych: This patient is ?  Fully capable of making decisions on his own behalf. 6. Skin/Wound Care: Routine skin checks 7. Fluids/Electrolytes/Nutrition: Routine in and outs.   8.  Post stroke dysphagia.  Dysphagia #2, thin liquids.  Follow-up speech therapy 9.  Hypertension.  Lopressor 25 mg twice daily, lisinopril 5 mg daily.    9/24: Bp soft and with orthostasis w/ dizziness in therapy. Stop Lisinopril.   9/25 bp's improved, sx improved             Monitor with increased mobility 10.  Polysubstance abuse-alcohol, tobacco, cocaine abuse.  Monitor for any signs of withdrawal.  Provide counseling 11. Urinary retention: voiding trial  -PVR's in 100's so far  LOS: 3 days A FACE TO FACE EVALUATION WAS PERFORMED  10/25 09/18/2020, 12:04 PM

## 2020-09-19 DIAGNOSIS — L8990 Pressure ulcer of unspecified site, unspecified stage: Secondary | ICD-10-CM

## 2020-09-19 HISTORY — DX: Pressure ulcer of unspecified site, unspecified stage: L89.90

## 2020-09-19 NOTE — Progress Notes (Addendum)
A message was left for a call back to patient's sister [name] to inform her of pressure injuries. His other sister Luetta Nutting was also called & there was no answer. Instructed to call his present nurse at earliest convenience. On call provider was also informed this morning

## 2020-09-19 NOTE — Plan of Care (Signed)
  Problem: RH BLADDER ELIMINATION Goal: RH STG MANAGE BLADDER WITH ASSISTANCE Description: STG Manage Bladder With Assistance Patient will manage bladder with mod assistance. Outcome: Not Progressing; incontinence

## 2020-09-19 NOTE — Progress Notes (Signed)
[place] PHYSICAL MEDICINE & REHABILITATION PROGRESS NOTE   Subjective/Complaints: Pt in bed. No complaints. Denies any pain. RN reported to me new pressure wounds on buttocks  ROS: Limited due to cognitive/behavioral   Objective:   No results found. No results for input(s): WBC, HGB, HCT, PLT in the last 72 hours. No results for input(s): NA, K, CL, CO2, GLUCOSE, BUN, CREATININE, CALCIUM in the last 72 hours.  Intake/Output Summary (Last 24 hours) at 09/19/2020 1140 Last data filed at 09/19/2020 0700 Gross per 24 hour  Intake 895 ml  Output 125 ml  Net 770 ml     Pressure Injury 09/18/20 Buttocks Left;Upper Stage 2 -  Partial thickness loss of dermis presenting as a shallow open injury with a red, pink wound bed without slough. open blister (Active)  09/18/20 2200  Location: Buttocks  Location Orientation: Left;Upper  Staging: Stage 2 -  Partial thickness loss of dermis presenting as a shallow open injury with a red, pink wound bed without slough.  Wound Description (Comments): open blister  Present on Admission: No     Pressure Injury 09/18/20 Buttocks Left;Lower Stage 2 -  Partial thickness loss of dermis presenting as a shallow open injury with a red, pink wound bed without slough. open blister (Active)  09/18/20 2201  Location: Buttocks  Location Orientation: Left;Lower  Staging: Stage 2 -  Partial thickness loss of dermis presenting as a shallow open injury with a red, pink wound bed without slough.  Wound Description (Comments): open blister  Present on Admission: No    Physical Exam: Vital Signs Blood pressure 113/78, pulse 66, temperature 97.7 F (36.5 C), resp. rate 18, height 5\' 7"  (1.702 m), weight 74.7 kg, SpO2 94 %.   Constitutional: No distress . Vital signs reviewed. HEENT: EOMI, oral membranes moist Neck: supple Cardiovascular: RRR without murmur. No JVD    Respiratory/Chest: CTA Bilaterally without wheezes or rales. Normal effort    GI/Abdomen: BS  +, non-tender, non-distended Ext: no clubbing, cyanosis, or edema Psych: pleasant and cooperative Skin: Left lower buttock stage II, open blister/stage II left upper buttock,  Neurological:     Mental Status: He is alert.     Comments: Alert and oriented x2, impaired stm Very dysarthric speech Follows simple commands.   Limited awareness of deficits but he did state weakness of his left side. Motor: RUE: 4+/5 proximal distal RLE: 4/5 proximal distal LUE: 4/5 proximal distal LLE: 3+/5 proximal distal        Assessment/Plan: 1. 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  Physiatrist is providing close team supervision and 24 hour management of active medical problems listed below.  Physiatrist and rehab team continue to assess barriers to discharge/monitor patient progress toward functional and medical goals  Care Tool:  Bathing    Body parts bathed by patient: Chest, Abdomen, Left arm, Right upper leg, Left upper leg, Face   Body parts bathed by helper: Right lower leg, Left lower leg, Front perineal area, Buttocks, Right arm     Bathing assist Assist Level: Maximal Assistance - Patient 24 - 49%     Upper Body Dressing/Undressing Upper body dressing   What is the patient wearing?: Pull over shirt    Upper body assist Assist Level: Maximal Assistance - Patient 25 - 49%    Lower Body Dressing/Undressing Lower body dressing      What is the patient wearing?: Pants, Incontinence  brief     Lower body assist Assist for lower body dressing: Maximal Assistance - Patient 25 - 49%     Toileting Toileting    Toileting assist Assist for toileting: Dependent - Patient 0%     Transfers Chair/bed transfer  Transfers assist     Chair/bed transfer assist level: 2 Helpers (squat pivot)     Locomotion Ambulation   Ambulation assist   Ambulation  activity did not occur: Safety/medical concerns  Assist level: 2 helpers Assistive device: Hand held assist Max distance: 79ft   Walk 10 feet activity   Assist  Walk 10 feet activity did not occur: Safety/medical concerns  Assist level: 2 helpers Assistive device: Hand held assist   Walk 50 feet activity   Assist Walk 50 feet with 2 turns activity did not occur: Safety/medical concerns (didn't include turns)         Walk 150 feet activity   Assist Walk 150 feet activity did not occur: Safety/medical concerns         Walk 10 feet on uneven surface  activity   Assist Walk 10 feet on uneven surfaces activity did not occur: Safety/medical concerns         Wheelchair     Assist Will patient use wheelchair at discharge?: Yes Type of Wheelchair: Manual Wheelchair activity did not occur: Safety/medical concerns         Wheelchair 50 feet with 2 turns activity    Assist            Wheelchair 150 feet activity     Assist          Blood pressure 113/78, pulse 66, temperature 97.7 F (36.5 C), resp. rate 18, height 5\' 7"  (1.702 m), weight 74.7 kg, SpO2 94 %.    Medical Problem List and Plan: 1.  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  Antithrombotics: -DVT/anticoagulation: SCDs             -antiplatelet therapy: N/A 3. Pain Management: Tylenol as needed. Well controlled  4. Mood: Advised emotional support             -antipsychotic agents: Seroquel 25 mg nightly.  9/24: wean to 12.5mg --did well with this dose--continue 5. Neuropsych: This patient is ?  Fully capable of making decisions on his own behalf. 6. Skin/Wound Care: foam dressing, pressure relief, education for buttock wounds 7. Fluids/Electrolytes/Nutrition: Routine in and outs.    8.  Post stroke dysphagia.  Dysphagia #2, thin liquids.  Follow-up speech therapy 9.  Hypertension.  Lopressor 25 mg twice daily, lisinopril 5 mg daily.    9/24: Bp soft and with orthostasis w/ dizziness in therapy. Stop Lisinopril.   9/26 bp's improved, sx improved             Monitor with increased mobility 10.  Polysubstance abuse-alcohol, tobacco, cocaine abuse.  Monitor for any signs of withdrawal.  Provide counseling 11. Urinary retention: voiding trial  -PVR's now have been 100 or less--can dc checks  LOS: 4 days A FACE TO FACE EVALUATION WAS PERFORMED  10/26 09/19/2020, 11:40 AM

## 2020-09-20 ENCOUNTER — Inpatient Hospital Stay (HOSPITAL_COMMUNITY): Payer: Self-pay | Admitting: Speech Pathology

## 2020-09-20 ENCOUNTER — Inpatient Hospital Stay (HOSPITAL_COMMUNITY): Payer: Self-pay

## 2020-09-20 ENCOUNTER — Inpatient Hospital Stay (HOSPITAL_COMMUNITY): Payer: Self-pay | Admitting: Occupational Therapy

## 2020-09-20 NOTE — Progress Notes (Signed)
+/-   sleep. Eye patch on/off for double vision. Large incontinent void, requiring 2 assist to change bed and for hygiene. Zachary Flores A

## 2020-09-20 NOTE — Progress Notes (Signed)
Speech Language Pathology Daily Session Note  Patient Details  Name: Zachary Flores MRN: [id number] Date of Birth: [birth year]  Today's Date: 09/20/2020 SLP Individual Time: 0737-1062 SLP Individual Time Calculation (min): 57 min  Short Term Goals: Week 1: SLP Short Term Goal 1 (Week 1): Patient will tolerate trials of Dys 3 mechanical soft solids with minA for clearing oral residuals and mild anterior spillage of solids. SLP Short Term Goal 2 (Week 1): Patient will sequence steps to perform basic level ADL task (brushing teeth, etc) with minA for accuracy. SLP Short Term Goal 3 (Week 1): Patient will achieve 85% intelligibility at sentence level with minA for speech strategies. SLP Short Term Goal 4 (Week 1): Patient will maintain topic during structured conversation with modA cues to redirect. SLP Short Term Goal 5 (Week 1): Patient will demonstrate emergent awareness by describing at least 2 deficits and thier impact on his function, with modA. SLP Short Term Goal 6 (Week 1): Patient will recall and restate basic level information from therapy/nursing therapy/interventions during the day with modA  Skilled Therapeutic Interventions: Pt was seen for skilled ST targeting dysphagia and cognitive goals. SLP facilitated session with upgraded trial of advanced Dys 3 (mech soft) solids with thin liquid. No overt s/sx aspiration noted across textures. Per chart review, pt has hx is oral phase deficits leading to oral residue and anterior loss, however none noted today. Of note, trial was limited due to quantity. Would recommend additional trial and potentially trial meal/tray prior to advancement. Recommend continue current diet for now. Pt was somewhat restless and highly distractible throughout session, requiring Mod-Max A for sustained attention to tasks. He also attempted to complete tasks before full instructions given and was mostly unaware of functional errors. Max A verbal and visual cues required  for error awareness, organization, and problem solving during basic money management tasks using cash and coins. Mod-Max also provided for management of items on his tray; pt frequently knocked over drinks, food, etc. with limited ability to self-monitor. Awareness of task difficulty levels also fluctuated throughout session; Min-Mod A verbal cues provided for intellectual awareness. Pt left sitting in tilt in space chair with seatbelt alarm set and needs within reach. Continue per current plan of care.          Pain Pain Assessment Pain Scale: Faces Faces Pain Scale: Hurts Zachary more Pain Type: Acute pain Pain Location: Buttocks Pain Descriptors / Indicators: Discomfort Pain Onset: On-going (with sitting) Pain Intervention(s): Distraction;Repositioned Multiple Pain Sites: No  Therapy/Group: Individual Therapy  Zachary Flores 09/20/2020, 12:05 PM

## 2020-09-20 NOTE — Progress Notes (Signed)
   09/20/20 1600  Clinical Encounter Type  Visited With Patient  Visit Type Initial  Referral From Nurse  Consult/Referral To Chaplain  Spiritual Encounters  Spiritual Needs Prayer  Stress Factors  Patient Stress Factors Major life changes  Family Stress Factors None identified  Advance Directives (For Healthcare)  Does Patient Have a Medical Advance Directive? No  Would patient like information on creating a medical advance directive? Yes (Inpatient - patient requests chaplain consult to create a medical advance directive)  Chaplain visited with patient on consult. Gave patient AD material and patient will discuss with family and contact chaplain when ready.

## 2020-09-20 NOTE — Progress Notes (Signed)
Physical Therapy Session Note  Patient Details  Name: Zachary Flores MRN: [id number] Date of Birth: [birth year]  Today's Date: 09/20/2020 PT Individual Time: 0800-0900 PT Individual Time Calculation (min): 60 min   Short Term Goals: Week 1:  PT Short Term Goal 1 (Week 1): Pt will perform bed mobility with modA PT Short Term Goal 2 (Week 1): Pt will maintain unsupported sitting EOB >5 min with modA PT Short Term Goal 3 (Week 1): Pt will perform bed<>chair transfers with modA and LRAD PT Short Term Goal 4 (Week 1): Pt will initiate pre-gait training with modA and LRAD  Skilled Therapeutic Interventions/Progress Updates:    Pt received supine in bed, agreeable to PT session, denies pain. Pt able to recall therapist without cueing. Donned B TED's with totalA while supine. Pt reporting incontinent of urine with saturated brief. Performed bridging with modA while supine in bed to remove brief and provided him soaped washcloth for pericare which he was able to do with his RUE. Another bridging effort for donning clean brief, requires assist for bracing and placing BLE's for bridging technique. Donned new pants with maxA as well, while supine in bed. Supine<>sit with modA with HOB flat and using bedrail, completed via log rolling technique. Initially requiring maxA for sitting balance, progressing to minA. Donned shirt with modA while seated EOB, he has difficulty using his LUE to assist with fine motor control and pulling down his shirt or manipulating objects. Sit<>stand with min/modA form raised EOB heigh to [place]. Pt requesting to brush his teeth, therefore wheeled him sinkside while seated in perched position on stedy. Performed additional sit<>stand in min/modA in stedy to sink and he used his RUE to brush his teeth while standing, requiring modA for balance. Pt with great difficulty multi-tasking while standing and sometimes becomes impulsive with reaching, benefits from slowed down, simplified tasks.  Stedy transfer to TIS and [facility] transport with totalA for time management from his room to main therapy gym, wheeled inside // bars. Assessed BP once in // bars to hx of orthostatics, results below and pt denies lightheadedness although he was drowsy which he endorses was from being early in the morning. Performed a few sit<>stands with minA in // bars, requires hand-over-hand assist for LUE to grab // bar and B knee block to prevent legs scooting forward. Mirrors both anterior and laterally for visual feedback and to assist with midline orientation. Attempted to perform both static standing marching and a few steps forward but unable to maintain balance with stepping activities. Due to time constraints, unable to further attempt and he was transported back to his room where he remained seated in TIS w/c with belt alarm on, needs in reach.   Therapy Documentation Precautions:  Precautions Precautions: Fall Precaution Comments: left sided weakness; monitor BP Restrictions Weight Bearing Restrictions: No  Therapy/Group: Individual Therapy  Zachary Flores Zachary Flores PT 09/20/2020, 10:59 AM

## 2020-09-20 NOTE — Progress Notes (Signed)
Patient ID: Zachary Flores, male   DOB: [birth year], 54 y.o.   MRN: [id number]  Sister-bessie here to meet with [employer]-med assist to begin disability and medicaid applications. Have left HCPOA in room for chaplain to notorize for him. Made [name] aware the hospital does not notorize durable POA. Discussed again pt will need 24 hr care at discharge and will have a team conference Wed to discuss goals and target discharge date. Will call her Wed to give update.

## 2020-09-20 NOTE — Progress Notes (Signed)
Occupational Therapy Session Note  Patient Details  Name: Zachary Flores MRN: [id number] Date of Birth: December 12, 1966  Today's Date: 09/20/2020 OT Individual Time: 1300-1420 OT Individual Time Calculation (min): 80 min    Short Term Goals: Week 1:  OT Short Term Goal 1 (Week 1): Pt will complete UB dressing with mod assist OT Short Term Goal 2 (Week 1): Pt will complete UB bathing with min assist OT Short Term Goal 3 (Week 1): Pt will complete self feeding with min assist OT Short Term Goal 4 (Week 1): Pt will be able to stand with mod assist in preperation for LB ADLs  Skilled Therapeutic Interventions/Progress Updates:    Pt sitting up in w/c, reporting "Im ready to go to my room"  OT reoriented pt to current location.  Pt able to correctly identify time and situation after reorientation.  Pt completed sinkside bathing, dressing, and oral care in seated at w/c and standing in stedy.  Pt needing max VCs throughout for initiation of tasks, sequencing, problem solving, safety awareness, and visual tracking facilitation.  Pt required supervision to brush teeth with step by step cueing to apply toothpaste to toothbrush with some hand over hand needed due to dysmetria and apraxia.  Pt doffed shirt overhead with supervision.  Pt bathed UB with min assist to bathe back.  Pt completed sit to stand at stedy with CGA and required intermittent min assist to maintain midline posture due to left sided leaning especially when distracted during self care.  Pt requesting urgent urinal use and required max assist for placement and had continent episode of urine. Pt doffed pants and brief with max assist.  Pt washed periarea and upper legs seated with supervision.  Pt stood and washed buttocks with min assist to maintain balance.  Pt needed max assist to donn brief and pants sitting EOB and standing in stedy.  Pt requesting to return to bed.  Mod assist sit to supine.  Pt participated in neuro re-ed BUE to facilitate  increased visual tracking, precision with reach/grasp/placing of items.  Pt noted with better proprioception and gross motor control in right hand compared to left but also impaired visual tracking and focused attention to task a barrier as well.  Paddle call bell in reach, bed alarm on.  Therapy Documentation Precautions:  Precautions Precautions: Fall Precaution Comments: left sided weakness; monitor BP Restrictions Weight Bearing Restrictions: No     Therapy/Group: Individual Therapy  Amie Critchley 09/20/2020, 3:41 PM

## 2020-09-20 NOTE — Progress Notes (Signed)
[facility] PHYSICAL MEDICINE & REHABILITATION PROGRESS NOTE   Subjective/Complaints:   No issues per pt, incont of urine although pt denies bladder issues   ROS: Limited due to cognitive/behavioral   Objective:   No results found. No results for input(s): WBC, HGB, HCT, PLT in the last 72 hours. No results for input(s): NA, K, CL, CO2, GLUCOSE, BUN, CREATININE, CALCIUM in the last 72 hours.  Intake/Output Summary (Last 24 hours) at 09/20/2020 0751 Last data filed at 09/19/2020 2300 Gross per 24 hour  Intake 712 ml  Output --  Net 712 ml     Pressure Injury 09/18/20 Buttocks Left;Upper Stage 2 -  Partial thickness loss of dermis presenting as a shallow open injury with a red, pink wound bed without slough. open blister (Active)  09/18/20 2200  Location: Buttocks  Location Orientation: Left;Upper  Staging: Stage 2 -  Partial thickness loss of dermis presenting as a shallow open injury with a red, pink wound bed without slough.  Wound Description (Comments): open blister  Present on Admission: No     Pressure Injury 09/18/20 Buttocks Left;Lower Stage 2 -  Partial thickness loss of dermis presenting as a shallow open injury with a red, pink wound bed without slough. open blister (Active)  09/18/20 2201  Location: Buttocks  Location Orientation: Left;Lower  Staging: Stage 2 -  Partial thickness loss of dermis presenting as a shallow open injury with a red, pink wound bed without slough.  Wound Description (Comments): open blister  Present on Admission: No    Physical Exam: Vital Signs Blood pressure 114/81, pulse 63, temperature (!) 97.4 F (36.3 C), resp. rate 16, height 5\' 7"  (1.702 m), weight 74.7 kg, SpO2 96 %.   Constitutional: No distress . Vital signs reviewed. HEENT: EOMI, oral membranes moist Neck: supple Cardiovascular: RRR without murmur. No JVD    Respiratory/Chest: CTA Bilaterally without wheezes or rales. Normal effort    GI/Abdomen: BS +, non-tender,  non-distended Ext: no clubbing, cyanosis, or edema Psych: pleasant and cooperative Skin: Left lower buttock stage II, open blister/stage II left upper buttock, Epitheliazing no drainage  Neurological:     Mental Status: He is alert.     Comments: Alert and oriented x2, impaired stm Very dysarthric speech Follows simple commands.   Limited awareness of deficits but he did state weakness of his left side. Motor: RUE: 4+/5 proximal distal RLE: 4/5 proximal distal LUE: 4-/5 proximal distal LLE: 3+/5 proximal distal        Assessment/Plan: 1. 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  Physiatrist is providing close team supervision and 24 hour management of active medical problems listed below.  Physiatrist and rehab team continue to assess barriers to discharge/monitor patient progress toward functional and medical goals  Care Tool:  Bathing    Body parts bathed by patient: Chest, Abdomen, Left arm, Right upper leg, Left upper leg, Face   Body parts bathed by helper: Right lower leg, Left lower leg, Front perineal area, Buttocks, Right arm     Bathing assist Assist Level: Maximal Assistance - Patient 24 - 49%     Upper Body Dressing/Undressing Upper body dressing   What is the patient wearing?: Pull over shirt    Upper body assist Assist Level: Maximal Assistance - Patient 25 - 49%    Lower Body Dressing/Undressing Lower body dressing      What is the patient wearing?: Pants,  Incontinence brief     Lower body assist Assist for lower body dressing: Maximal Assistance - Patient 25 - 49%     Toileting Toileting    Toileting assist Assist for toileting: Dependent - Patient 0%     Transfers Chair/bed transfer  Transfers assist     Chair/bed transfer assist level: 2 Helpers (squat pivot)     Locomotion Ambulation   Ambulation assist    Ambulation activity did not occur: Safety/medical concerns  Assist level: 2 helpers Assistive device: Hand held assist Max distance: 5ft   Walk 10 feet activity   Assist  Walk 10 feet activity did not occur: Safety/medical concerns  Assist level: 2 helpers Assistive device: Hand held assist   Walk 50 feet activity   Assist Walk 50 feet with 2 turns activity did not occur: Safety/medical concerns (didn't include turns)         Walk 150 feet activity   Assist Walk 150 feet activity did not occur: Safety/medical concerns         Walk 10 feet on uneven surface  activity   Assist Walk 10 feet on uneven surfaces activity did not occur: Safety/medical concerns         Wheelchair     Assist Will patient use wheelchair at discharge?: Yes Type of Wheelchair: Manual Wheelchair activity did not occur: Safety/medical concerns         Wheelchair 50 feet with 2 turns activity    Assist            Wheelchair 150 feet activity     Assist          Blood pressure 114/81, pulse 63, temperature (!) 97.4 F (36.3 C), resp. rate 16, height 5\' 7"  (1.702 m), weight 74.7 kg, SpO2 96 %.    Medical Problem List and Plan: 1.  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  Antithrombotics: -DVT/anticoagulation: SCDs             -antiplatelet therapy: N/A 3. Pain Management: Tylenol as needed. Well controlled  4. Mood: Advised emotional support             -antipsychotic agents: Seroquel 25 mg nightly.  9/24: wean to 12.5mg --did well with this dose-DC 5. Neuropsych: This patient is ?  Fully capable of making decisions on his own behalf. 6. Skin/Wound Care: foam dressing, pressure relief, education for buttock wounds 7. Fluids/Electrolytes/Nutrition:  Routine in and outs.   8.  Post stroke dysphagia.  Dysphagia #2, thin liquids.  Follow-up speech therapy 9.  Hypertension.  Lopressor 25 mg twice daily, lisinopril 5 mg daily.   Vitals:   09/19/20 1938 09/20/20 0354  BP: 111/75 114/81  Pulse: 66 63  Resp: 16 16  Temp: 98.3 F (36.8 C) (!) 97.4 F (36.3 C)  SpO2: 95% 96%  monitor for dizziness with position changes  10.  Polysubstance abuse-alcohol, tobacco, cocaine abuse.  Monitor for any signs of withdrawal.  Provide counseling 11. Urinary retention: voiding trial  -PVR's now have been 100 or less--can dc checks  LOS: 5 days A FACE TO FACE EVALUATION WAS PERFORMED  09/22/20 09/20/2020, 7:51 AM

## 2020-09-21 ENCOUNTER — Inpatient Hospital Stay (HOSPITAL_COMMUNITY): Payer: Self-pay | Admitting: Occupational Therapy

## 2020-09-21 ENCOUNTER — Inpatient Hospital Stay (HOSPITAL_COMMUNITY): Payer: Self-pay | Admitting: Speech Pathology

## 2020-09-21 ENCOUNTER — Inpatient Hospital Stay (HOSPITAL_COMMUNITY): Payer: Self-pay

## 2020-09-21 DIAGNOSIS — I69391 Dysphagia following cerebral infarction: Secondary | ICD-10-CM

## 2020-09-21 DIAGNOSIS — I952 Hypotension due to drugs: Secondary | ICD-10-CM

## 2020-09-21 DIAGNOSIS — Z8679 Personal history of other diseases of the circulatory system: Secondary | ICD-10-CM

## 2020-09-21 DIAGNOSIS — R339 Retention of urine, unspecified: Secondary | ICD-10-CM

## 2020-09-21 DIAGNOSIS — I6350 Cerebral infarction due to unspecified occlusion or stenosis of unspecified cerebral artery: Secondary | ICD-10-CM

## 2020-09-21 MED ORDER — METOPROLOL TARTRATE 12.5 MG HALF TABLET
12.5000 mg | ORAL_TABLET | Freq: Two times a day (BID) | ORAL | Status: DC
  Administered 2020-09-21 – 2020-09-23 (×4): 12.5 mg via ORAL
  Filled 2020-09-21 (×4): qty 1

## 2020-09-21 NOTE — Progress Notes (Signed)
Speech Language Pathology Daily Session Note  Patient Details  Name: Zachary Flores MRN: [id number] Date of Birth: [birth year]  Today's Date: 09/21/2020 SLP Individual Time: 1300-1356 SLP Individual Time Calculation (min): 56 min  Short Term Goals: Week 1: SLP Short Term Goal 1 (Week 1): Patient will tolerate trials of Dys 3 mechanical soft solids with minA for clearing oral residuals and mild anterior spillage of solids. SLP Short Term Goal 2 (Week 1): Patient will sequence steps to perform basic level ADL task (brushing teeth, etc) with minA for accuracy. SLP Short Term Goal 3 (Week 1): Patient will achieve 85% intelligibility at sentence level with minA for speech strategies. SLP Short Term Goal 4 (Week 1): Patient will maintain topic during structured conversation with modA cues to redirect. SLP Short Term Goal 5 (Week 1): Patient will demonstrate emergent awareness by describing at least 2 deficits and thier impact on his function, with modA. SLP Short Term Goal 6 (Week 1): Patient will recall and restate basic level information from therapy/nursing therapy/interventions during the day with modA  Skilled Therapeutic Interventions: Pt was seen for skilled ST targeting cognitive skills. Pt oriented to self, general situation and place, but disoriented to time. Calendar posted in room as external aid to promote greater recall and orientation to time during his stay. Pt required Max A to read and interpret information from basic medication labels, including how to organize a basic pill chart according to the labels. He also required Mod A verbal cues for problem solving and attention to detail to identify 2-3 differences between photographs. Fluctuating Min-Mod A verbal cues required for redirection to tasks throughout session. He stated awareness of level of difficulty these tasks presented him at end of session and stated "I need more of that", and expressed interest in continuing to improve  attention, problem solving, and visual scanning. He demonstrated how to use remote to turn TV on/of with Min A verbal and visual cues at end of session. Min A verbal cues also provided for recall of last ST session/activities. He independently recalled his therapy schedule, stating awareness this was his last session for the day. Pt left sitting in chair with alarm set and needs within reach.        Pain Pain Assessment Pain Scale: Faces Faces Pain Scale: No hurt  Therapy/Group: Individual Therapy  [name] 09/21/2020, 3:05 PM

## 2020-09-21 NOTE — Progress Notes (Signed)
Occupational Therapy Session Note  Patient Details  Name: Zachary Flores MRN: [id number] Date of Birth: 1966-09-02  Today's Date: 09/21/2020 OT Individual Time: 1003-1100 OT Individual Time Calculation (min): 57 min    Short Term Goals: Week 1:  OT Short Term Goal 1 (Week 1): Pt will complete UB dressing with mod assist OT Short Term Goal 2 (Week 1): Pt will complete UB bathing with min assist OT Short Term Goal 3 (Week 1): Pt will complete self feeding with min assist OT Short Term Goal 4 (Week 1): Pt will be able to stand with mod assist in preperation for LB ADLs  Skilled Therapeutic Interventions/Progress Updates:    Pt sitting up in w/c, no c/o pain, ready for OT session.  Pt brushed teeth at sink with supervision for step by step VCs for sequencing and facilitation of visual scanning.  Pt showed improved ability to apply toothpaste on toothbrush using left hand to hold and right hand to complete fine motor manipulation.  Pt then doffed shirt with supervision overhead.  Pt bathed UB with min assist for back only with some improvement noted in motor control RUE.  Pt donned shirt overhead needing min assist and VCs for problem solving and implementing compensatory technique.  Pt requesting to toilet, therefore transported in w/c to bathroom and completed SPT using grab bars requiring max assist for BUE and BLE positioning, sequencing, and balance.  Pt required CGA for static sitting and max assist to ensure urination directed in toilet.  Pt had continent episode of bowel as well.  Pt attempting to stand impulsively a couple of times needing VCs for safety.  Pt stood at stedy for max assist pericare and pt pulling pants up/down over hips with min assist.  Pt doffed pants with max assist and bathed LB with mod assist.  Pt donned pants, brief, and socks with max assist.  Paddle call bell in reach, seat belt alarm on.  Therapy Documentation Precautions:  Precautions Precautions: Fall Precaution  Comments: left sided weakness; monitor BP Restrictions Weight Bearing Restrictions: No   Therapy/Group: Individual Therapy  Amie Critchley 09/21/2020, 12:27 PM

## 2020-09-21 NOTE — Progress Notes (Signed)
Patient ID: Zachary Flores, male   DOB: 11-27-1966, 54 y.o.   MRN: [id number]  Spoke with [name]-sister via telephone to discuss pt's discharge plan. Their brother frank is suppose to be getting an apartment, both he and pt were staying with elderly Mother until she passed away recently, now both of them are misplaced and have no home. Pt may be NHP and made aware with him being uninsured and a drug history he will be difficult to place even if can get a LOG. Sister did start SSD and Medicaid yesterday. Will see what team conference results are but know he will need care at discharge. Will work with sister on a plan.

## 2020-09-21 NOTE — Progress Notes (Signed)
[facility] PHYSICAL MEDICINE & REHABILITATION PROGRESS NOTE   Subjective/Complaints: Patient seen sitting up in bed this morning.  He states he slept well overnight.  No reported issues overnight.  He is working with therapies.  He states he is improving.  ROS: Limited due to cognition, but appears to deny CP, shortness of breath, nausea, vomiting, diarrhea.  Objective:   No results found. No results for input(s): WBC, HGB, HCT, PLT in the last 72 hours. No results for input(s): NA, K, CL, CO2, GLUCOSE, BUN, CREATININE, CALCIUM in the last 72 hours.  Intake/Output Summary (Last 24 hours) at 09/21/2020 1421 Last data filed at 09/21/2020 1300 Gross per 24 hour  Intake 720 ml  Output 100 ml  Net 620 ml     Pressure Injury 09/18/20 Buttocks Left;Upper Stage 2 -  Partial thickness loss of dermis presenting as a shallow open injury with a red, pink wound bed without slough. open blister (Active)  09/18/20 2200  Location: Buttocks  Location Orientation: Left;Upper  Staging: Stage 2 -  Partial thickness loss of dermis presenting as a shallow open injury with a red, pink wound bed without slough.  Wound Description (Comments): open blister  Present on Admission: No     Pressure Injury 09/18/20 Buttocks Left;Lower Stage 2 -  Partial thickness loss of dermis presenting as a shallow open injury with a red, pink wound bed without slough. open blister (Active)  09/18/20 2201  Location: Buttocks  Location Orientation: Left;Lower  Staging: Stage 2 -  Partial thickness loss of dermis presenting as a shallow open injury with a red, pink wound bed without slough.  Wound Description (Comments): open blister  Present on Admission: No    Physical Exam: Vital Signs Blood pressure 96/68, pulse 62, temperature 98.7 F (37.1 C), resp. rate 18, height 5\' 7"  (1.702 m), weight 74.7 kg, SpO2 99 %. Constitutional: No distress . Vital signs reviewed. HENT: Normocephalic.  Atraumatic. Eyes: EOMI. No  discharge. Cardiovascular: No JVD.  RRR. Respiratory: Normal effort.  No stridor.  Bilateral clear to auscultation. GI: Non-distended.  BS +. Skin: Warm and dry.  Buttock ulcers not examined today Psych: Confused Musc: No edema in extremities.  No tenderness in extremities. Neuro: Alert and oriented, except for date of month with increased time and cues Dysarthria Follows simple commands.   Limited awareness of deficits  Motor: RUE: 4+/5 proximal distal, unchanged RLE: 4-4+/5 proximal distal LUE: 3+/5 proximal distal LLE: Hip flexion, knee extension 4 -/5, ankle dorsiflexion 3/5  Left upper extremity dysmetria       Assessment/Plan: 1. 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  Physiatrist is providing close team supervision and 24 hour management of active medical problems listed below.  Physiatrist and rehab team continue to assess barriers to discharge/monitor patient progress toward functional and medical goals  Care Tool:  Bathing    Body parts bathed by patient: Chest, Abdomen, Right upper leg, Left upper leg, Face, Front perineal area, Left arm, Right arm   Body parts bathed by helper: Right lower leg, Left lower leg, Buttocks     Bathing assist Assist Level: Moderate Assistance - Patient 50 - 74%     Upper Body Dressing/Undressing Upper body dressing   What is the patient wearing?: Pull over shirt    Upper body assist Assist Level: Minimal Assistance - Patient > 75%    Lower Body Dressing/Undressing Lower body dressing  What is the patient wearing?: Pants, Incontinence brief     Lower body assist Assist for lower body dressing: Maximal Assistance - Patient 25 - 49%     Toileting Toileting    Toileting assist Assist for toileting: Maximal Assistance - Patient 25 - 49%     Transfers Chair/bed transfer  Transfers assist      Chair/bed transfer assist level: 2 Helpers (squat pivot)     Locomotion Ambulation   Ambulation assist   Ambulation activity did not occur: Safety/medical concerns  Assist level: 2 helpers Assistive device: Hand held assist Max distance: 6ft   Walk 10 feet activity   Assist  Walk 10 feet activity did not occur: Safety/medical concerns  Assist level: 2 helpers Assistive device: Hand held assist   Walk 50 feet activity   Assist Walk 50 feet with 2 turns activity did not occur: Safety/medical concerns (didn't include turns)         Walk 150 feet activity   Assist Walk 150 feet activity did not occur: Safety/medical concerns         Walk 10 feet on uneven surface  activity   Assist Walk 10 feet on uneven surfaces activity did not occur: Safety/medical concerns         Wheelchair     Assist Will patient use wheelchair at discharge?: Yes Type of Wheelchair: Manual Wheelchair activity did not occur: Safety/medical concerns         Wheelchair 50 feet with 2 turns activity    Assist            Wheelchair 150 feet activity     Assist          Blood pressure 96/68, pulse 62, temperature 98.7 F (37.1 C), resp. rate 18, height 5\' 7"  (1.702 m), weight 74.7 kg, SpO2 99 %.    Medical Problem List and Plan: 1.  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  Antithrombotics: -DVT/anticoagulation: SCDs             -antiplatelet therapy: N/A 3. Pain Management: Tylenol as needed. Well controlled  4. Mood: Advised emotional support             -antipsychotic agents: Seroquel 25 mg nightly DC'd 5. Neuropsych: This patient is not fully capable of making decisions on his own behalf. 6. Skin/Wound Care: foam dressing, pressure relief, education for buttock wounds 7. Fluids/Electrolytes/Nutrition: Routine in and outs.   8.  Post stroke  dysphagia.  Dysphagia # 2, thin liquids.  Advance diet as tolerated 9.  Hypertension.    Lopressor 25 mg twice daily, decreased to 12.5 twice daily on 9/28  Lisinopril DC'd Vitals:   09/21/20 0940 09/21/20 1256  BP:  96/68  Pulse: 68 62  Resp:  18  Temp:  98.7 F (37.1 C)  SpO2:  99%   Relatively soft on 9/28 10.  Polysubstance abuse-alcohol, tobacco, cocaine abuse.  Monitor for any signs of withdrawal.  Provide counseling 11. Urinary retention:   Improved  LOS: 6 days A FACE TO FACE EVALUATION WAS PERFORMED  Zachary Flores 10/28 09/21/2020, 2:21 PM

## 2020-09-21 NOTE — Progress Notes (Signed)
Physical Therapy Session Note  Patient Details  Name: Zachary Flores MRN: [id number] Date of Birth: 1966/06/21  Today's Date: 09/21/2020 PT Individual Time: 0800-0910 PT Individual Time Calculation (min): 70 min   Short Term Goals: Week 1:  PT Short Term Goal 1 (Week 1): Pt will perform bed mobility with modA PT Short Term Goal 2 (Week 1): Pt will maintain unsupported sitting EOB >5 min with modA PT Short Term Goal 3 (Week 1): Pt will perform bed<>chair transfers with modA and LRAD PT Short Term Goal 4 (Week 1): Pt will initiate pre-gait training with modA and LRAD  Skilled Therapeutic Interventions/Progress Updates:    Pt received supine in bed, agreeable to PT session. Pt denies pain but reports he voiding in brief. When asked, he jokingly states that "it's just easier" to void in brief rather than trying to use urinal. Educated him on calling out to nursing staff to assist with urinal use and importance of not voiding in brief due to presence of skin breakdown on bottom. He voiced understanding. Doffed dirty brief with totalA and provided soaped washcloth where he was able to do pericare with setupA. Donned new brief and pants with max/totalA, using bridging technique. Supine<>sit with heavy modA towards L side of bed using log rolling technique. Able to sit EOB with minA and RUE grasp to bedrail. Performed sit<>stand with minA in Stedy and than while seated in perched position, wheeled sinkside as he was requesting to brush his teeth. Required max cueing for brushing his teeth, step-by-step sequential planning. Also requiring hand-over-hand assist due to dysmetria and motor apraxia. One instance of dropping to toothbrush accidentally while trying to brush. Stedy transfer to his w/c, requiring maxA for posterior scooting. WC transport with totalA for time management to main therapy gym. Sit<>stand in // bars with minA with mod assist for LUE to grasp bar. Ambulated length of // bars with mod/maxA,  demo's step-to gait with significant BLE (LLE>RLE) ataxia and difficulty maintaining erect posture with midline. Wheeled outside to attempt gait using 3-muskateers technique with +2 assist and 3rd person with w/c follow for safety. He ambulated ~55ft with mod/maxA +2 with continuous cues for "1, 2" for foot patterns and therapist assist with LLE facilitation to promote widening BOS and hip flexion/extension. After seated rest, performed side stepping with B HR support facing wall with maxA +2, totaling ~68ft with assist for both B hand and LLE advancement. Pt reporting fatigue at this point. WC transport back to his room where he remained seated (tilted) in w/c, belt alarm on, needs in reach. Pt voiced excitement about further attempts to walk.  Therapy Documentation Precautions:  Precautions Precautions: Fall Precaution Comments: left sided weakness; monitor BP Restrictions Weight Bearing Restrictions: No  Therapy/Group: Individual Therapy  Michaelann Gunnoe P Daishaun Ayre PT 09/21/2020, 7:56 AM

## 2020-09-22 ENCOUNTER — Inpatient Hospital Stay (HOSPITAL_COMMUNITY): Payer: Self-pay | Admitting: Occupational Therapy

## 2020-09-22 ENCOUNTER — Inpatient Hospital Stay (HOSPITAL_COMMUNITY): Payer: Self-pay | Admitting: Speech Pathology

## 2020-09-22 ENCOUNTER — Inpatient Hospital Stay (HOSPITAL_COMMUNITY): Payer: Self-pay

## 2020-09-22 DIAGNOSIS — F1910 Other psychoactive substance abuse, uncomplicated: Secondary | ICD-10-CM

## 2020-09-22 NOTE — Progress Notes (Signed)
Patient continue to rest quietly w/o acute distress or complaints, denies pain, oriented to person,place and knows his birthday/ including year. Cooperative and follow simple commands at times, Left eye patch in place, consumed 1/2 of Ensure po with assistance ,cueing with patient to consume po liquids and tolerated well. Incontinent of urine, care provided., repositioned Q 2 hrs, and pressure sacral wound monitor with no drainage. Monitor and assisted upon rounding in no acute distress.Continue regime, soft touch call bell within reach, bed alarm on,

## 2020-09-22 NOTE — Progress Notes (Signed)
Physical Therapy Session Note  Patient Details  Name: Zachary Flores MRN: [id number] Date of Birth: 1966-05-16  Today's Date: 09/22/2020 PT Individual Time: 0737-1062 PT Individual Time Calculation (min): 60 min   Short Term Goals: Week 1:  PT Short Term Goal 1 (Week 1): Pt will perform bed mobility with modA PT Short Term Goal 2 (Week 1): Pt will maintain unsupported sitting EOB >5 min with modA PT Short Term Goal 3 (Week 1): Pt will perform bed<>chair transfers with modA and LRAD PT Short Term Goal 4 (Week 1): Pt will initiate pre-gait training with modA and LRAD  Skilled Therapeutic Interventions/Progress Updates:    Pt received supine in bed, agreeable to PT session, denies pain. Pt eager to attempt further gait trials. He also reports he has been calling out to nursing staff to assist with urinal use rather than voiding in brief for convience. Donned boxer briefs and sweatpants with maxA via bridging technique, unable to fully clear buttock from bed surface. Supine<>sit with heavy modA with HOB flat, use of bed rails and requires assist for both trunk and BLE management. One sitting EOB, requires modA initially, progressing to minA with RUE grasping bed rail. Squat<>pivot transfer with modA from EOB to TIS w/c, therapist cueing for head/hip sequencing, foot placement. Pt requesting to brush his teeth, therefore he was wheeled sinkside in w/c to perform oral care. Pt mildly impulsive with reaching for items, and due to his significant motor apraxia, was knocking over other items that were on the sink. Using his RUE, he brushed his teeth although sometimes missing and brushing cheek by accident. After completion of oral care, WC transport for time management from his room to hallway outside main therapy gym. Performed interval gait training 3x40ft with modA and R HR and 3-muskateers technique with close w/c follow for safety. Mirror for visual feedback to assist with erect posture and orientation.  Pt demo's crouched gait with decreased B knee extension in stance, decreased R step length, and significant ataxia in BLE's. He was able to correct posturing ~50% of the time with max cues. Pt then expressing need to void, therefore wheeled him to ADL bathroom where he performed sit<>stand with min/modA and maintained standing with minA guard while he tried to void in toilet, unsuccessful with voiding attempt. WC transport back to his room for time management where he remained reclined in TIS w/c, seat belt alarm on, and needs in reach.  Therapy Documentation Precautions:  Precautions Precautions: Fall Precaution Comments: left sided weakness; monitor BP Restrictions Weight Bearing Restrictions: No  Therapy/Group: Individual Therapy  Deny Chevez P Shannelle Alguire PT 09/22/2020, 11:43 AM

## 2020-09-22 NOTE — Progress Notes (Signed)
PHYSICAL MEDICINE & REHABILITATION PROGRESS NOTE   Subjective/Complaints: Patient seen sitting up in his chair this morning.  He states he slept well overnight.  He states he feels better.  ROS: Limited due to cognition, but appears to deny CP, shortness of breath, nausea, vomiting, diarrhea.  Objective:   No results found. No results for input(s): WBC, HGB, HCT, PLT in the last 72 hours. No results for input(s): NA, K, CL, CO2, GLUCOSE, BUN, CREATININE, CALCIUM in the last 72 hours.  Intake/Output Summary (Last 24 hours) at 09/22/2020 1122 Last data filed at 09/22/2020 0800 Gross per 24 hour  Intake 600 ml  Output 200 ml  Net 400 ml     Pressure Injury 09/18/20 Buttocks Left;Upper Stage 2 -  Partial thickness loss of dermis presenting as a shallow open injury with a red, pink wound bed without slough. open blister (Active)  09/18/20 2200  Location: Buttocks  Location Orientation: Left;Upper  Staging: Stage 2 -  Partial thickness loss of dermis presenting as a shallow open injury with a red, pink wound bed without slough.  Wound Description (Comments): open blister  Present on Admission: No     Pressure Injury 09/18/20 Buttocks Left;Lower Stage 2 -  Partial thickness loss of dermis presenting as a shallow open injury with a red, pink wound bed without slough. open blister (Active)  09/18/20 2201  Location: Buttocks  Location Orientation: Left;Lower  Staging: Stage 2 -  Partial thickness loss of dermis presenting as a shallow open injury with a red, pink wound bed without slough.  Wound Description (Comments): open blister  Present on Admission: No    Physical Exam: Vital Signs Blood pressure 120/88, pulse 62, temperature 98.2 F (36.8 C), resp. rate 16, height 5\' 7"  (1.702 m), weight 74.7 kg, SpO2 93 %. Constitutional: No distress . Vital signs reviewed. HENT: Normocephalic.  Atraumatic. Eyes: EOMI. No discharge. Cardiovascular: No JVD.  RRR. Respiratory:  Normal effort.  No stridor.  Bilateral clear to auscultation. GI: Non-distended.  BS +. Skin: Warm and dry.  Buttock ulcers not examined today. Psych: Normal mood.  Normal behavior. Musc: No edema in extremities.  No tenderness in extremities. Neuro: Alert Dysarthria, unchanged Follows simple commands.   Limited awareness of deficits  Motor: RUE: 4+/5 proximal distal, unchanged RLE: 4-4+/5 proximal distal LUE: 3+/5 proximal distal LLE: Hip flexion, knee extension 4 -/5, ankle dorsiflexion 3/5  Left upper extremity dysmetria  Assessment/Plan: 1. 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  Physiatrist is providing close team supervision and 24 hour management of active medical problems listed below.  Physiatrist and rehab team continue to assess barriers to discharge/monitor patient progress toward functional and medical goals  Care Tool:  Bathing    Body parts bathed by patient: Chest, Abdomen, Right upper leg, Left upper leg, Face, Front perineal area, Left arm, Right arm   Body parts bathed by helper: Right lower leg, Left lower leg, Buttocks     Bathing assist Assist Level: Moderate Assistance - Patient 50 - 74%     Upper Body Dressing/Undressing Upper body dressing   What is the patient wearing?: Pull over shirt    Upper body assist Assist Level: Minimal Assistance - Patient > 75%    Lower Body Dressing/Undressing Lower body dressing      What is the patient wearing?: Pants, Incontinence brief     Lower body assist Assist for lower  body dressing: Maximal Assistance - Patient 25 - 49%     Toileting Toileting    Toileting assist Assist for toileting: Maximal Assistance - Patient 25 - 49%     Transfers Chair/bed transfer  Transfers assist     Chair/bed transfer assist level: 2 Helpers (squat pivot)     Locomotion Ambulation   Ambulation  assist   Ambulation activity did not occur: Safety/medical concerns  Assist level: 2 helpers Assistive device: Hand held assist Max distance: 50ft   Walk 10 feet activity   Assist  Walk 10 feet activity did not occur: Safety/medical concerns  Assist level: 2 helpers Assistive device: Hand held assist   Walk 50 feet activity   Assist Walk 50 feet with 2 turns activity did not occur: Safety/medical concerns (didn't include turns)         Walk 150 feet activity   Assist Walk 150 feet activity did not occur: Safety/medical concerns         Walk 10 feet on uneven surface  activity   Assist Walk 10 feet on uneven surfaces activity did not occur: Safety/medical concerns         Wheelchair     Assist Will patient use wheelchair at discharge?: Yes Type of Wheelchair: Manual Wheelchair activity did not occur: Safety/medical concerns         Wheelchair 50 feet with 2 turns activity    Assist            Wheelchair 150 feet activity     Assist          Blood pressure 120/88, pulse 62, temperature 98.2 F (36.8 C), resp. rate 16, height 5\' 7"  (1.702 m), weight 74.7 kg, SpO2 93 %.    Medical Problem List and Plan: 1.  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 Please see conference note from today as well.  2.  Antithrombotics: -DVT/anticoagulation: SCDs             -antiplatelet therapy: N/A 3. Pain Management: Tylenol as needed. Well controlled  4. Mood: Advised emotional support             -antipsychotic agents: Seroquel 25 mg nightly DC'd 5. Neuropsych: This patient is not fully capable of making decisions on his own behalf. 6. Skin/Wound Care: foam dressing,  pressure relief, education for buttock wounds 7. Fluids/Electrolytes/Nutrition: Routine in and outs.   8.  Post stroke dysphagia.    Dysphagia # 2, thin liquids.  Advance diet as tolerated 9.  Hypertension.    Lopressor 25 mg twice daily, decreased to 12.5 twice daily on 9/28  Lisinopril DC'd Vitals:   09/22/20 0350 09/22/20 0820  BP: 120/88   Pulse: (!) 58 62  Resp: 16   Temp: 98.2 F (36.8 C)   SpO2: 93%    Relatively controlled on 9/29 10.  Polysubstance abuse-alcohol, tobacco, cocaine abuse.  Monitor for any signs of withdrawal.    Counsel 11. Urinary retention:   Improved  LOS: 7 days A FACE TO FACE EVALUATION WAS PERFORMED  Hanna Aultman 10/29 09/22/2020, 11:22 AM

## 2020-09-22 NOTE — Progress Notes (Signed)
Speech Language Pathology Weekly Progress and Session Note  Patient Details  Name: Zachary Flores MRN: [id number] Date of Birth: 1966/10/10  Beginning of progress report period: September 16, 2020 End of progress report period: September 23, 2020  Today's Date: 09/23/2020 SLP Individual Time: 9379-0240 SLP Individual Time Calculation (min): 58 min  Short Term Goals: Week 1: SLP Short Term Goal 1 (Week 1): Patient will tolerate trials of Dys 3 mechanical soft solids with minA for clearing oral residuals and mild anterior spillage of solids. SLP Short Term Goal 1 - Progress (Week 1): Progressing toward goal SLP Short Term Goal 2 (Week 1): Patient will sequence steps to perform basic level ADL task (brushing teeth, etc) with minA for accuracy. SLP Short Term Goal 2 - Progress (Week 1): Progressing toward goal SLP Short Term Goal 3 (Week 1): Patient will achieve 85% intelligibility at sentence level with minA for speech strategies. SLP Short Term Goal 3 - Progress (Week 1): Met SLP Short Term Goal 4 (Week 1): Patient will maintain topic during structured conversation with modA cues to redirect. SLP Short Term Goal 4 - Progress (Week 1): Met SLP Short Term Goal 5 (Week 1): Patient will demonstrate emergent awareness by describing at least 2 deficits and thier impact on his function, with modA. SLP Short Term Goal 5 - Progress (Week 1): Progressing toward goal SLP Short Term Goal 6 (Week 1): Patient will recall and restate basic level information from therapy/nursing therapy/interventions during the day with modA SLP Short Term Goal 6 - Progress (Week 1): Met    New Short Term Goals: Week 2: SLP Short Term Goal 1 (Week 2): Patient will tolerate trials of Dys 3 mechanical soft solids with min A for clearing oral residuals and mild anterior spillage of solids X2 prior to upgrade. SLP Short Term Goal 2 (Week 2): Pt will demonstrate basic sequencing abilities during ADLs or other functional tasks  with Min A verbal/visual cues. SLP Short Term Goal 3 (Week 2): Pt will demonstrate emergent awareness by detecting functional errors with Mod A multimodal cues. SLP Short Term Goal 4 (Week 2): Pt will recall 2 safety precautions with Min A verbal/visual cues for aids and/or strategies. SLP Short Term Goal 5 (Week 2): Pt will sustain attention to functional tasks with Min A verbla/visual cues for redirection. SLP Short Term Goal 6 (Week 2): Pt will demonstrate ability to problem solve basic familiar situations with Mod A verbal/visual cues.  Weekly Progress Updates: Pt has made slow but some functional gains, meeting 3 out of 6 short term goals this reporting period. Pt is currently Mod-Max assist for basic cognitive tasks due to impairments impacting his attention, emergent and safety awareness, problem solving, and short term memory. Pt is consuming a dysphagia 2 (minced/ground) texture diet with thin liquids and Min A for use of compensatory strategies for oral clearance and minimizing anterior loss. He is still participating in Dys 3 (mech soft) texture trials with SLP to work toward solid advancement. Pt has mild dysarthria, currently requiring Min A verbal cues for repeating sentence level messages using slower rate and overarticulation to increase intelligibility. Pt's recall of new/daily information has improved some, as well as use of swallow strategies and speech intelligibility over the last week. Pt education is ongoing; no family has been present for ST sessions to date, and per SW pt may have limited caregivers/support, which could be a barrier to discharge, given that it is anticipated pt will required strict 24/7 supervision and hands  on care at discharge. Pt would continue to benefit from skilled ST while inpatient in order to maximize functional independence and reduce burden of care prior to discharge. Anticipate that pt will need 24/7 supervision at discharge in addition to [facility] follow up at  next level of care; it is ST's recommendation that SNF is explored if pt is unable to provide 24/7 care.     Intensity: Minumum of 1-2 x/day, 30 to 90 minutes Frequency: 3 to 5 out of 7 days Duration/Length of Stay: 10/07/20 Treatment/Interventions: Cognitive remediation/compensation;Internal/external aids;Functional tasks;Patient/family education;Speech/Language facilitation;Therapeutic Activities;Dysphagia/aspiration precaution training;Cueing hierarchy   Daily Session  Skilled Therapeutic Interventions: Pt was seen for skilled ST targeting dysphagia and cognitive goals. SLP facilitated session with variety of Dys 3 texture solids to assess readiness for advancement. Pt demonstrated fully efficient mastication and oral clearance, and no overt s/sx aspiration observed across solid or liquid textures. In addition, no anterior loss noted today. Recommend pt upgrade to Dys 3 textures, continue thins, meds may be given whole in applesauce, 1 at a time. Pt will still required full supervision during meals. Pt with improved ability to sequence steps of brushing his teeth today in comparison to yesterday - overall Min A verbal and visual cues provided. He also completed a basic 3-step action card sequencing task with Min A verbal and visual cues for problem solving and error awareness. SLP also introduced a novel basic card task (11 up), during which pt required Max faded to Mod A for basic problem solving. Moderate cues required for redirection to tasks in a quiet controlled environment throughout session. Pt left laying in bed with alarm set and needs within reach. Continue per current plan of care.           Pain Pain Assessment Pain Scale: 0-10 Pain Score: 0-No pain  Therapy/Group: Individual Therapy  Arbutus Leas 09/23/2020, 12:17 PM

## 2020-09-22 NOTE — Progress Notes (Addendum)
Occupational Therapy Session Note  Patient Details  Name: Zachary Flores MRN: [id number] Date of Birth: 24-Jan-1966  Today's Date: 09/22/2020 OT Individual Time:first session: 1133-1203 ; second session: 1255-1355 OT Individual Time Calculation (min): first session: 30 min ; second session: 60 min   Short Term Goals: Week 1:  OT Short Term Goal 1 (Week 1): Pt will complete UB dressing with mod assist OT Short Term Goal 2 (Week 1): Pt will complete UB bathing with min assist OT Short Term Goal 3 (Week 1): Pt will complete self feeding with min assist OT Short Term Goal 4 (Week 1): Pt will be able to stand with mod assist in preperation for LB ADLs  Skilled Therapeutic Interventions/Progress Updates:    First session: Pt sitting up in w/c, reporting his upper back aches.  Repositioned pt with mod assist to decrease pain due to pt with right sided lean at rest.  Pt agreeable to OT session and transported to ortho gym for neuro re-ed BUE.  Pt participated in resistive push and pull task at UBE x8 minutes needing ace wrap over left hand to promote sustained gross grasp.  Pt then positioned in front of dynavision to complete visual scanning and precision reach task using RUE then LUE.  Pts ataxia much greater in LUE compared to RUE.  Pt using right hand over hand of left to facilitate proprioceptive input and normal movement to accurately touch each light.  Pt transported back to room, paddle call bell in reach, seat belt alarm on.     Second session: Pt sitting up in TIS w/c, no c/o pain, requesting to toilet.  Pt required min assist sit<>stand at stedy to 3 in 1 commode in bathroom.  Pt needing max VCs and TCs to correct left sided leaning with poor carryover.  Pt needed TD for toileting due to pt requiring BUE for support during standing.  Pt completed dressing UB with min assist to stabilize LUE at side to prevent ataxic movement interference.  Pt bathed UB with min assist for back and to wash under  right arm needing HOH.  Pt then transported to ortho gym to for neuro re-ed of LUE.  Pt completed resistive mat table slides to touch large orange cone push and pull with 4 lb wrist weight donned LUE to promote increased proprioceptive input to reduce ataxia and improve functional use.  Pt able to touch target with light hand over hand and intermittently without tactile cues.  Completed multidirections.  Pt transported to room, call bell in reach, seat belt alarm on.  Therapy Documentation Precautions:  Precautions Precautions: Fall Precaution Comments: left sided weakness; monitor BP Restrictions Weight Bearing Restrictions: No   Therapy/Group: Individual Therapy  Amie Critchley 09/22/2020, 12:27 PM

## 2020-09-22 NOTE — Progress Notes (Signed)
Speech Language Pathology Daily Session Note  Patient Details  Name: Zachary Flores MRN: [id number] Date of Birth: [birth year]  Today's Date: 09/22/2020 SLP Individual Time: 7425-9563 SLP Individual Time Calculation (min): 57 min  Short Term Goals: Week 1: SLP Short Term Goal 1 (Week 1): Patient will tolerate trials of Dys 3 mechanical soft solids with minA for clearing oral residuals and mild anterior spillage of solids. SLP Short Term Goal 2 (Week 1): Patient will sequence steps to perform basic level ADL task (brushing teeth, etc) with minA for accuracy. SLP Short Term Goal 3 (Week 1): Patient will achieve 85% intelligibility at sentence level with minA for speech strategies. SLP Short Term Goal 4 (Week 1): Patient will maintain topic during structured conversation with modA cues to redirect. SLP Short Term Goal 5 (Week 1): Patient will demonstrate emergent awareness by describing at least 2 deficits and thier impact on his function, with modA. SLP Short Term Goal 6 (Week 1): Patient will recall and restate basic level information from therapy/nursing therapy/interventions during the day with modA  Skilled Therapeutic Interventions: Pt was seen for skilled ST targeting dysphagia and cognitive goals. SLP facilitated session with thin H2O and trial of upgraded Dys 3 (mech soft) texture snack to work toward advancement. Pt exhibited efficient mastication and oral clearance with Min A verbal cues for use of liquid washes to clear trace lingual residue. Min anterior loss of solid noted X1 with Mod A verbal cues provided for pt's awareness and correction. No overt s/sx aspiration noted across liquid or solid intake. Recommend trial tray or larger quantity trial prior to advancement. Continue current diet for now. Pt required Mod A multimodal cues to sequence steps of brushing his teeth, Max A for functional problem solving during task. SLP further facilitated session with a PEG board task, in which pt  recreated basic designs from photograph representations. He required Max A verbal and visual cues for problem solving, Max faded to Mod A for awareness of errors during task. Pt's level of sustained attention to tasks still fluctuated, requiring between Min-Mod A verbal cues for redirection during session. Pt left sitting in tilt in space chair with alarm set and needs within reach. Continue per current plan of care.          Pain Pain Assessment Pain Scale: 0-10 Pain Score: 0-No pain  Therapy/Group: Individual Therapy  Zachary Flores 09/22/2020, 3:00 PM

## 2020-09-22 NOTE — Patient Care Conference (Signed)
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 Maryla Morrow Care Coodinator Present: Chana Bode, RN, BSN, CRRN;Other (comment) Kriste Basque Dupree, SW) Nurse Present:  Lupita Dawn, RN) PT Present: Other (comment) (Christain Manhard, PT) OT Present: Dolphus Jenny, OT SLP Present: Suzzette Righter, CF-SLP PPS Coordinator present : Fae Pippin, SLP     Current Status/Progress Goal Weekly Team Focus  Bowel/Bladder   Patient is incontinent of Bladder/Bowel , LBM 09/21/2020  Patient will be continent of blader/bowel  QS/PRN assessment ,time toileting Q2 hrs with repostioning and turning Q 2 hrs   Swallow/Nutrition/ Hydration   Dys 2, thin, full supervision mostly due to cognition  Supervision  advanced Dys 3 trials, independence use of swallow strategies and awareness   ADL's   min assist stedy transfers; max assist squat pivot; min assist/sup UB ADLs; maxA LB ADLs  min assist overal  self care training, BUE NMR, sitting/standing balance, functional transfer training   Mobility   modA/maxA bed mobility, minA transfers with Stedy vs maxA squatpivot. Gait 9ft maxA +2 skilled PT.  minA  standing balance, continue gait training, BLE coordination   Communication    Min-Mod A comprehension and speech intelligibility strategies  Supervision  carryover speech intellgibility and comprehnesion strategies   Safety/Cognition/ Behavioral Observations  Mod-Max A  Supervision  basic familiar problem solving, safety and emergent awareness, sustained attention   Pain   Patient denies pain  pain free  Assess pain QS/PRN, monitor non verbal gesture and verbal complaints, provide and assess need for meds or alternative measures   Skin   Patient has a stage 2 pressure ulcer to left upper and lower buttocks areas, no drainage, unattached areas,  Prevent addtional skin breakdown  QS/PRN assessment, repositioned and turn Q2 hrs , provide incontinent care in a timely manner assess Braden Scale QS     Discharge Planning:  Sister working on a plan for him with other siblings currently has not where to live he and brother. May become NHP but difficult to place due to uninsured and drug history. SSD and Medicaid started by sister   Team Discussion: HTN improved. Continue to note dysphagia and diplopia. Note UE motor control apraxia and impulsivity. Small gains noted within the past week. Progress impaired by cognitive status, impulsivity, poor awareness of care needs, poor attention and basic problem solving issues. Patient has been calling for assistance with personal needs. Patient on target to meet rehab goals: yes  *See Care Plan and progress notes for long and short-term goals.   Revisions to Treatment Plan:   Teaching Needs: Transfers, toileting, medications, etc.  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   Barriers to Discharge: Medical stability;Decreased family/caregiver support;Home enviroment access/layout;Nutrition means   Possible Resolutions to [employer] Focus: Therapies, optimize BP meds - avoid hypoperfusion, advance diet as tolerated   Continued Need for Acute Rehabilitation Level of Care: The patient requires daily medical management by a physician with specialized training in physical medicine and rehabilitation for the following reasons: Direction of a multidisciplinary physical rehabilitation program to maximize functional independence : Yes Medical management of patient stability for increased activity during participation in an intensive rehabilitation regime.: Yes Analysis of laboratory values and/or radiology reports with any subsequent need for medication adjustment and/or medical intervention. : Yes   I attest that I was present, lead the team conference, and concur with the assessment and plan of the team.   Chana Bode B 09/22/2020, 1:45 PM

## 2020-09-23 ENCOUNTER — Inpatient Hospital Stay (HOSPITAL_COMMUNITY): Payer: Self-pay

## 2020-09-23 ENCOUNTER — Inpatient Hospital Stay (HOSPITAL_COMMUNITY): Payer: Self-pay | Admitting: Occupational Therapy

## 2020-09-23 ENCOUNTER — Inpatient Hospital Stay (HOSPITAL_COMMUNITY): Payer: Self-pay | Admitting: Speech Pathology

## 2020-09-23 NOTE — Progress Notes (Signed)
Physical Therapy Session Note  Patient Details  Name: Zachary Flores MRN: [id number] Date of Birth: 05-22-1966  Today's Date: 09/23/2020 PT Individual Time: 5188-4166 PT Individual Time Calculation (min): 28 min   Short Term Goals: Week 1:  PT Short Term Goal 1 (Week 1): Pt will perform bed mobility with modA PT Short Term Goal 2 (Week 1): Pt will maintain unsupported sitting EOB >5 min with modA PT Short Term Goal 3 (Week 1): Pt will perform bed<>chair transfers with modA and LRAD PT Short Term Goal 4 (Week 1): Pt will initiate pre-gait training with modA and LRAD  Skilled Therapeutic Interventions/Progress Updates:    Pt received supine in bed, sleeping on arrival but awakens easily to voice; he denies any pain. Donned shoes with totalA for time management. Supine<>sit with modA with HOB elevated, assist for BLE management and trunk to upright. Squat<>pivot transfer with modA towards his L side from EOB to w/c. WC transport for time management to main hallway outside therapy gym. Focus of session gait training where he ambulated 15ft + 39ft + 66ft (seated rests) with modA and R HR with w/c follow for safety. Demo's ataxic gait in BLE's (LLE>RLE), decreased R step length, and decreased B knee extension in stance. Therapist facilitating LLE swing/foot placement and R pelvic rotation to improve R step length. Gait deficits improves with trials. WC transport back to his room where he remained reclined in TIS w/c, seat belt alarm on, needs in reach.  Therapy Documentation Precautions:  Precautions Precautions: Fall Precaution Comments: left sided weakness; monitor BP Restrictions Weight Bearing Restrictions: No  Therapy/Group: Individual Therapy  Linetta Regner P Jayvier Burgher PT 09/23/2020, 3:39 PM

## 2020-09-23 NOTE — Plan of Care (Signed)
  Problem: RH BOWEL ELIMINATION Goal: RH STG MANAGE BOWEL WITH ASSISTANCE Description: STG Manage Bowel with Assistance. Outcome: Not Progressing; incontinence   Problem: RH BLADDER ELIMINATION Goal: RH STG MANAGE BLADDER WITH ASSISTANCE Description: STG Manage Bladder With Assistance Patient will manage bladder with mod assistance. Outcome: Not Progressing; incontinece

## 2020-09-23 NOTE — Progress Notes (Signed)
Zachary Flores   Subjective/Complaints: Patient seen laying in bed this AM.  He states he slept well overnight.  He states he has therapies today.   ROS: Limited due to cognition, but appears to deny CP, shortness of breath, nausea, vomiting, diarrhea.  Objective:   No results found. No results for input(s): WBC, HGB, HCT, PLT in the last 72 hours. No results for input(s): NA, K, CL, CO2, GLUCOSE, BUN, CREATININE, CALCIUM in the last 72 hours.  Intake/Output Summary (Last 24 hours) at 09/23/2020 1400 Last data filed at 09/23/2020 0801 Gross per 24 hour  Intake 420 ml  Output 300 ml  Net 120 ml     Pressure Injury 09/18/20 Buttocks Left;Upper Stage 2 -  Partial thickness loss of dermis presenting as a shallow open injury with a red, pink wound bed without slough. open blister (Active)  09/18/20 2200  Location: Buttocks  Location Orientation: Left;Upper  Staging: Stage 2 -  Partial thickness loss of dermis presenting as a shallow open injury with a red, pink wound bed without slough.  Wound Description (Comments): open blister  Present on Admission: No     Pressure Injury 09/18/20 Buttocks Left;Lower Stage 2 -  Partial thickness loss of dermis presenting as a shallow open injury with a red, pink wound bed without slough. open blister (Active)  09/18/20 2201  Location: Buttocks  Location Orientation: Left;Lower  Staging: Stage 2 -  Partial thickness loss of dermis presenting as a shallow open injury with a red, pink wound bed without slough.  Wound Description (Comments): open blister  Present on Admission: No    Physical Exam: Vital Signs Blood pressure 115/78, pulse (!) 59, temperature 98.6 F (37 C), resp. rate 18, height 5\' 7"  (1.702 m), weight 74.7 kg, SpO2 94 %.  Constitutional: No distress . Vital signs reviewed. HENT: Normocephalic.  Atraumatic. Eyes: EOMI. No discharge. Cardiovascular: No JVD.  RRR. Respiratory: Normal  effort.  No stridor.  Bilateral clear to auscultation. GI: Non-distended.  BS +. Skin: Warm and dry.  Buttock ulcers not examined today. Psych: Normal mood.  Normal behavior. Musc: No edema in extremities.  No tenderness in extremities. Neuro: Alert Dysarthria, dysarthria  Follows simple commands.   Limited awareness of deficits  Left neglect Left sided apraxia Motor: RUE: 4+/5 proximal distal, unchanged RLE: 4-4+/5 proximal distal LUE: 4/5 proximal distal LLE: Hip flexion, knee extension 4/5, ankle dorsiflexion 3/5  Left upper extremity dysmetria  Assessment/Plan: 1. 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  Physiatrist is providing close team supervision and 24 hour management of active medical problems listed below.  Physiatrist and rehab team continue to assess barriers to discharge/monitor patient progress toward functional and medical goals     Medical Problem List and Plan: 1.  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  Antithrombotics: -DVT/anticoagulation: SCDs             -antiplatelet therapy: N/A 3. Pain Management: Tylenol as needed.  4. Mood: Advised emotional support             -antipsychotic agents: Seroquel 25 mg nightly DC'd 5. Neuropsych: This patient is not fully capable of making decisions on his own behalf. 6. Skin/Wound Care: foam dressing, pressure relief, education for buttock wounds 7. Fluids/Electrolytes/Nutrition: Routine in and outs.  BMP ordered for tomorrow 8.  Post stroke dysphagia.    Dysphagia # 2, thin liquids.  Advance diet as tolerated 9.  History of hypertension, now with hypotension.    Lopressor 25 mg twice daily, decreased to 12.5 twice daily on 9/28, DC'd on 9/30  Lisinopril DC'd Vitals:   09/22/20 1931 09/23/20  0505  BP: 111/86 115/78  Pulse: 66 (!) 59  Resp: 18 18  Temp: 98.4 F (36.9 C) 98.6 F (37 C)  SpO2: 98% 94%  10.  Polysubstance abuse-alcohol, tobacco, cocaine abuse.  Monitor for any signs of withdrawal.    Counsel 11. Urinary retention:   Improved  LOS: 8 days A FACE TO FACE EVALUATION WAS PERFORMED  Zachary Flores Zachary Flores 09/23/2020, 2:00 PM

## 2020-09-23 NOTE — Progress Notes (Signed)
Occupational Therapy Session Note  Patient Details  Name: Zachary Flores MRN: [id number] Date of Birth: 03-02-1966  Today's Date: 09/23/2020 OT Individual Time: 1300-1400 OT Individual Time Calculation (min): 60 min    Short Term Goals: Week 1:  OT Short Term Goal 1 (Week 1): Pt will complete UB dressing with mod assist OT Short Term Goal 2 (Week 1): Pt will complete UB bathing with min assist OT Short Term Goal 3 (Week 1): Pt will complete self feeding with min assist OT Short Term Goal 4 (Week 1): Pt will be able to stand with mod assist in preperation for LB ADLs  Skilled Therapeutic Interventions/Progress Updates:    Pt sitting up in w/c, requesting to defer bathing and dressing this session, therefore notified nursing of pts need for night bath.  Pt requesting to work on LUE in gym.  Pt completed BUE strengthening at UBE x 8 minutes with left hand supported with ace wrap due to ataxic hand movement. Pt requesting to toilet mid session therefore transported pt to room and max assist needed for clothing and urinal management with pt having continent episode of urine.  Pt transported back to gym for remainder of session. Neuro re-ed completed sitting in front of mat table for resistive push pull tasks in multidirection.  Increased control of LUE to touch target in various directions while keeping FA and hand in contact with mat entire time.  Added functional grasp/release component using small bean bags and medium pegs.  Pt able to grasp bean bags however needing hand over hand to release and needed hand over hand to grasp and release pegs.  Pt completed resistive gripping, composite extension, and rolling of medium resistive putty with TCs provided for neuro re-ed to increase gross and fine motor control of left hand.  Pt requesting back to bed at end of session.  Transfer w/c to EOB using stedy with pt needing CGA to maintain balance and dependent transport.  Pt completed sit to supine with min  assist.  Call bell in reach, bed alarm on.  Therapy Documentation Precautions:  Precautions Precautions: Fall Precaution Comments: left sided weakness; monitor BP Restrictions Weight Bearing Restrictions: No   Therapy/Group: Individual Therapy  Amie Critchley 09/23/2020, 4:31 PM

## 2020-09-23 NOTE — Progress Notes (Signed)
Patient ID: Zachary Flores, male   DOB: 1966/08/21, 54 y.o.   MRN: [id number]  Met with pt and spoke with [name]-Sister to discuss team conference min assist level goals and target discharge 10/14. [name] reports their is no one to assist him and he has no place to go. His brohter-Frank was suppose to get an apartment and he can't find one but he also works and goes to HD. [name] wants pt to go to a NH for continued therapies. Will need to see if can get a LOG and a bed offer. [name] wants to see if can and go from there. She has applied for SSD and medicaid since being here. Pt is agreeable and will do what his sister wants him to do. He can see his progress this week and hope it continues.

## 2020-09-23 NOTE — Progress Notes (Signed)
Physical Therapy Session Note  Patient Details  Name: Cordarius Benning MRN: [id number] Date of Birth: 1966-08-19  Today's Date: 09/23/2020 PT Individual Time: 1115-1203 PT Individual Time Calculation (min): 48 min   Short Term Goals: Week 1:  PT Short Term Goal 1 (Week 1): Pt will perform bed mobility with modA PT Short Term Goal 2 (Week 1): Pt will maintain unsupported sitting EOB >5 min with modA PT Short Term Goal 3 (Week 1): Pt will perform bed<>chair transfers with modA and LRAD PT Short Term Goal 4 (Week 1): Pt will initiate pre-gait training with modA and LRAD  Skilled Therapeutic Interventions/Progress Updates:    Patient received supine in bed agreeable to PT. He denies pain at this time. Patient able to achieve sitting edge of bed with MinA and max verbal cueing for sequencing and safety. MaxA squat pivot transfer to wc. Patient requesting to use restroom insisting on standing at toilet. Patient able to stand with ModA provided by PT for postural control, however, with toilet riser in the way, patient would not have been successful at urinating in toilet, so PT opted to have patient sit instead. MaxA to have patient turn to sit on toilet. He did not urinate, but did have small bowel movement. TotalA for peri hygiene and clothing management standing in [place]. Patient transferred back to wc via Stedy. He was able to stand in // bars with MinA, but Max verbal cuing and MaxA provided at L knee to control base of support, step length, knee flex/extend in stance and toe in/toe out. Patient maintaining crouched posture throughout gait cycle, but was intermittently responsive to verbal cue "stand tall." Very wide base of support self-selected by patient prior to PT intervention to help control this. He was able to ambulate 2 laps in the // bars before returning to room. Patient remaining up in wc, seatbelt alarm on, call light and soft touch call light within reach.   Therapy  Documentation Precautions:  Precautions Precautions: Fall Precaution Comments: left sided weakness; monitor BP Restrictions Weight Bearing Restrictions: No    Therapy/Group: Individual Therapy  Elizebeth Koller, PT, DPT, CBIS 09/23/2020, 7:42 AM

## 2020-09-24 ENCOUNTER — Inpatient Hospital Stay (HOSPITAL_COMMUNITY): Payer: Self-pay

## 2020-09-24 ENCOUNTER — Inpatient Hospital Stay (HOSPITAL_COMMUNITY): Payer: Self-pay | Admitting: Occupational Therapy

## 2020-09-24 DIAGNOSIS — D75839 Thrombocytosis, unspecified: Secondary | ICD-10-CM

## 2020-09-24 DIAGNOSIS — I959 Hypotension, unspecified: Secondary | ICD-10-CM

## 2020-09-24 LAB — BASIC METABOLIC PANEL
Anion gap: 9 (ref 5–15)
BUN: 11 mg/dL (ref 6–20)
CO2: 28 mmol/L (ref 22–32)
Calcium: 9.7 mg/dL (ref 8.9–10.3)
Chloride: 101 mmol/L (ref 98–111)
Creatinine, Ser: 0.8 mg/dL (ref 0.61–1.24)
GFR calc Af Amer: >60 mL/min (ref >60)
GFR calc non Af Amer: >60 mL/min (ref >60)
Glucose, Bld: 99 mg/dL (ref 70–99)
Potassium: 3.7 mmol/L (ref 3.5–5.1)
Sodium: 138 mmol/L (ref 135–145)

## 2020-09-24 LAB — CBC WITH DIFFERENTIAL/PLATELET
Abs Immature Granulocytes: 0.02 10*3/uL (ref 0.00–0.07)
Basophils Absolute: 0.1 10*3/uL (ref 0.0–0.1)
Basophils Relative: 1 %
Eosinophils Absolute: 0.1 10*3/uL (ref 0.0–0.5)
Eosinophils Relative: 2 %
HCT: 43.2 % (ref 39.0–52.0)
Hemoglobin: 14.1 g/dL (ref 13.0–17.0)
Immature Granulocytes: 0 %
Lymphocytes Relative: 26 %
Lymphs Abs: 1.7 10*3/uL (ref 0.7–4.0)
MCH: 29.6 pg (ref 26.0–34.0)
MCHC: 32.6 g/dL (ref 30.0–36.0)
MCV: 90.6 fL (ref 80.0–100.0)
Monocytes Absolute: 0.6 10*3/uL (ref 0.1–1.0)
Monocytes Relative: 10 %
Neutro Abs: 3.9 10*3/uL (ref 1.7–7.7)
Neutrophils Relative %: 61 %
Platelets: 613 10*3/uL — ABNORMAL HIGH (ref 150–400)
RBC: 4.77 MIL/uL (ref 4.22–5.81)
RDW: 12.9 % (ref 11.5–15.5)
WBC: 6.4 10*3/uL (ref 4.0–10.5)
nRBC: 0 % (ref 0.0–0.2)

## 2020-09-24 NOTE — Progress Notes (Signed)
Physical Therapy Weekly Progress Note  Patient Details  Name: Zachary Flores MRN: [id number] Date of Birth: 08-Sep-1966  Beginning of progress report period: September 16, 2020 End of progress report period: September 24, 2020  Today's Date: 09/24/2020 PT Individual Time: 1100-1200 PT Individual Time Calculation (min): 60 min   Patient has met 4 of 4 short term goals. Pt making slow but appropriate progress towards his goals. He is now able to perform bed mobility with modA (using bed features), can maintain unsupported sitting EOB with minA, has begun performing squat<>pivot transfers with modA, and has also initiated gait training in hallway up to ~1f with modA +2 and consistently ambulating 348fwith modA of 1 with  R HR and close w/c follow for safety. He continues to demonstrate significant ataxia in x4 limbs, more involved in L HB. He also shows mild impulsivity with functional tasks and has questionable insight into his deficits.  Patient continues to demonstrate the following deficits muscle weakness, impaired timing and sequencing, abnormal tone, unbalanced muscle activation, motor apraxia, ataxia, decreased coordination and decreased motor planning, decreased visual perceptual skills, decreased midline orientation, decreased attention to left, decreased motor planning and ideational apraxia and decreased attention, decreased awareness, decreased problem solving and decreased safety awareness and therefore will continue to benefit from skilled PT intervention to increase functional independence with mobility.  Patient progressing toward long term goals..  Continue plan of care.  PT Short Term Goals Week 1:  PT Short Term Goal 1 (Week 1): Pt will perform bed mobility with modA PT Short Term Goal 2 (Week 1): Pt will maintain unsupported sitting EOB >5 min with modA PT Short Term Goal 3 (Week 1): Pt will perform bed<>chair transfers with modA and LRAD PT Short Term Goal 4 (Week 1): Pt will  initiate pre-gait training with modA and LRAD Week 2:  PT Short Term Goal 1 (Week 2): Pt will perform bed mobility with minA PT Short Term Goal 2 (Week 2): Pt will maintain unsupported sitting balance with no more than minA PT Short Term Goal 3 (Week 2): Pt will perform bed<>chair transfers with minA and LRAD PT Short Term Goal 4 (Week 2): Pt will ambulate [id number] modA and LRAD  Skilled Therapeutic Interventions/Progress Updates:    Pt received supine in bed, awake and agreeable to PT session. Pt denies pain. Donned TED's with totalA while he was supine, maxA for donning pants in bed. Supine<>sit with min/modA with HOB slightly elevated, using bed rail, assist for BLE management and trunk control to upright. Able to maintain sitting EOB today with minA without BUE support and required less amount of time seated to reach this level which is improvement. Squat<>pivot with modA towards L side from EOB to w/c with cues for safety, technique, and sequencing. Wheeled sinkside in w/c as he requests to brush his teeth. Performed oral care with minA while seated in chair, using RUE for brushing. After completion of oral care, he then requests need for toilet to have a BM. Wheeled in w/c into bathroom, performed squat<>pivot with modA to toilet (BSC over toilet). Required close supervision while he tried to void due to mild impulsivity and ataxia. Performed sit<>stand with minA from raised toilet and required totalA for pericare. Stand<>pivot with min/modA back to w/c. Donned shirt with modA while seated in w/c, difficulty pulling over head. WC transport for time management from his room to main therapy gym hallway. Ambulated [id number] rest breaks) with modA and R HR support with  w/c follow for safety. Therapist assisting LLE hip flexion and foot placement, as well as R pelvic rotation during RLE swing. VC for L knee extension in stance which he was able to correct ~25% of the time. He continues to demonstrate  significant BLE ataxia (LLE>RLE) with crouched gait pattern, mild impulsive with functional tasks as well. WC transport back to his room where he remained seated in w/c with belt alarm on, call bell in reach, made comfortable.  Therapy Documentation Precautions:  Precautions Precautions: Fall Precaution Comments: left sided weakness; monitor BP Restrictions Weight Bearing Restrictions: No  Therapy/Group: Individual Therapy  Bernhardt Riemenschneider P Bera Pinela PT 09/24/2020, 7:38 AM

## 2020-09-24 NOTE — Progress Notes (Signed)
Occupational Therapy Weekly Progress Note  Patient Details  Name: Zachary Flores MRN: [id number] Date of Birth: 02/09/1966  Beginning of progress report period: September 16, 2020 End of progress report period: September 24, 2020  Today's Date: 09/24/2020 OT Individual Time: 1415-1525 OT Individual Time Calculation (min): 70 min    Patient has met 3 of 4 short term goals.  Pt is progressing steadily towards goals, exhibiting improved visual scanning and visual motor control, improved BUE motor control, and improved sitting and standing balance.  Pt has greater ataxia in LUE than RUE however improvements in both noted.  Pt still slightly impulsive, needing cues for safety awareness.    Patient continues to demonstrate the following deficits: muscle weakness, decreased cardiorespiratoy endurance, impaired timing and sequencing, ataxia, decreased coordination and decreased motor planning, decreased visual acuity and decreased visual motor skills, decreased midline orientation and decreased motor planning, decreased attention, decreased awareness, decreased problem solving, decreased safety awareness, decreased memory and delayed processing and decreased sitting balance, decreased standing balance, decreased postural control and decreased balance strategies and therefore will continue to benefit from skilled OT intervention to enhance overall performance with BADL.  Patient progressing toward long term goals..  Continue plan of care.  OT Short Term Goals Week 1:  OT Short Term Goal 1 (Week 1): Pt will complete UB dressing with mod assist OT Short Term Goal 1 - Progress (Week 1): Met OT Short Term Goal 2 (Week 1): Pt will complete UB bathing with min assist OT Short Term Goal 2 - Progress (Week 1): Met OT Short Term Goal 3 (Week 1): Pt will complete self feeding with min assist OT Short Term Goal 3 - Progress (Week 1): Progressing toward goal OT Short Term Goal 4 (Week 1): Pt will be able to stand  with mod assist in preperation for LB ADLs OT Short Term Goal 4 - Progress (Week 1): Met Week 2:  OT Short Term Goal 1 (Week 2): Pt will complete SPT toilet transfer with min assist OT Short Term Goal 2 (Week 2): Pt will complete LB dressing with mod assist OT Short Term Goal 3 (Week 2): Pt will complete LB bathing with mod assist OT Short Term Goal 4 (Week 2): Pt will complete UB dressing with supervision.  Skilled Therapeutic Interventions/Progress Updates:    Pt sitting up in w/c agreeable to OT session.  BP taken at beginning of session 115/72 with TED hose donned.  Pt transported to ortho gym for seated theract for neuro re-ed BUE resistive push and pull tasks using laundry basket filled with 13 lb weights.  Pt also completed ring arc with 4lb wrist weight added to left wrist needing min hand over hand to facilitate precision pinch and grasp.  Pt also picked up weight laundry basket from floor to lap and returned.  Pt transported back to room, soft call bell in reach, seat belt alarm on.    Therapy Documentation Precautions:  Precautions Precautions: Fall Precaution Comments: left sided weakness; monitor BP Restrictions Weight Bearing Restrictions: No   Therapy/Group: Individual Therapy   L  09/24/2020, 4:19 PM   

## 2020-09-24 NOTE — Progress Notes (Signed)
Zachary Flores PHYSICAL MEDICINE & REHABILITATION PROGRESS NOTE   Subjective/Complaints: Patient seen performing sit to stand this a.m.  He states he slept well overnight.  He states he needs to use the restroom.  ROS: Limited due to cognition, but appears to deny CP, shortness of breath, nausea, vomiting, diarrhea.  Objective:   No results found. Recent Labs    09/24/20 0618  WBC 6.4  HGB 14.1  HCT 43.2  PLT 613*   Recent Labs    09/24/20 0618  NA 138  K 3.7  CL 101  CO2 28  GLUCOSE 99  BUN 11  CREATININE 0.80  CALCIUM 9.7    Intake/Output Summary (Last 24 hours) at 09/24/2020 1227 Last data filed at 09/24/2020 0840 Gross per 24 hour  Intake 580 ml  Output 200 ml  Net 380 ml     Pressure Injury 09/18/20 Buttocks Left;Upper Stage 2 -  Partial thickness loss of dermis presenting as a shallow open injury with a red, pink wound bed without slough. open blister (Active)  09/18/20 2200  Location: Buttocks  Location Orientation: Left;Upper  Staging: Stage 2 -  Partial thickness loss of dermis presenting as a shallow open injury with a red, pink wound bed without slough.  Wound Description (Comments): open blister  Present on Admission: No     Pressure Injury 09/18/20 Buttocks Left;Lower Stage 2 -  Partial thickness loss of dermis presenting as a shallow open injury with a red, pink wound bed without slough. open blister (Active)  09/18/20 2201  Location: Buttocks  Location Orientation: Left;Lower  Staging: Stage 2 -  Partial thickness loss of dermis presenting as a shallow open injury with a red, pink wound bed without slough.  Wound Description (Comments): open blister  Present on Admission: No    Physical Exam: Vital Signs Blood pressure 127/90, pulse (!) 56, temperature 97.9 F (36.6 C), resp. rate 16, height 5\' 7"  (1.702 m), weight 74.7 kg, SpO2 100 %.  Constitutional: No distress . Vital signs reviewed. HENT: Normocephalic.  Atraumatic. Eyes: EOMI. No  discharge. Cardiovascular: No JVD.  RRR. Respiratory: Normal effort.  No stridor.  Bilateral clear to auscultation. GI: Non-distended.  BS +. Skin: Warm and dry.  Buttock ulcers not examined today. Psych: Normal mood.  Normal behavior. Musc: No edema in extremities.  No tenderness in extremities. Neuro: Alert Dysarthria, unchanged Follows simple commands.   Limited awareness of deficits  Left neglect Left sided apraxia, persistent Motor: RUE: 4+/5 proximal distal, unchanged RLE: 4-4+/5 proximal distal LUE: 4/5 proximal distal LLE: Hip flexion, knee extension 4/5, ankle dorsiflexion 3/5  Left upper extremity dysmetria  Assessment/Plan: 1. 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  Physiatrist is providing close team supervision and 24 hour management of active medical problems listed below.  Physiatrist and rehab team continue to assess barriers to discharge/monitor patient progress toward functional and medical goals   Medical Problem List and Plan: 1.  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  Antithrombotics: -DVT/anticoagulation: SCDs             -antiplatelet therapy: N/A 3. Pain Management: Tylenol as needed.  4. Mood: Advised emotional support             -antipsychotic agents: Seroquel 25 mg nightly DC'd 5. Neuropsych: This patient is not fully capable of  making decisions on his own behalf. 6. Skin/Wound Care: foam dressing, pressure relief, education for buttock wounds 7. Fluids/Electrolytes/Nutrition: Routine in and outs.    BMP within normal limits on 10/1 8.  Post stroke dysphagia.      Advanced to D3 thins   Continue to advance diet as tolerated 9.  History of hypertension, now with hypotension.    Lopressor 25 mg twice daily, decreased to 12.5  twice daily on 9/28, DC'd on 9/30  Lisinopril DC'd Vitals:   09/23/20 1925 09/24/20 0514  BP: 104/73 127/90  Pulse: (!) 56 (!) 56  Resp: 16 16  Temp: 98.4 F (36.9 C) 97.9 F (36.6 C)  SpO2: 96% 100%   Remains relatively soft on 10/1 10.  Polysubstance abuse-alcohol, tobacco, cocaine abuse.  Monitor for any signs of withdrawal.    Counsel 11. Urinary retention:   Improved  12.  Thrombocytosis  Platelets 613 on 10/1, labs ordered for Monday   LOS: 9 days A FACE TO FACE EVALUATION WAS PERFORMED  Zachary Flores Zachary Flores 09/24/2020, 12:27 PM

## 2020-09-24 NOTE — Plan of Care (Signed)
  Problem: RH BLADDER ELIMINATION Goal: RH STG MANAGE BLADDER WITH ASSISTANCE Description: STG Manage Bladder With Assistance Patient will manage bladder with mod assistance. Outcome: Not Progressing; incontinent at times

## 2020-09-24 NOTE — Progress Notes (Signed)
Speech Language Pathology Daily Session Note  Patient Details  Name: Zachary Flores MRN: [id number] Date of Birth: Aug 08, 1966  Today's Date: 09/24/2020 SLP Individual Time: 0933-1030 SLP Individual Time Calculation (min): 57 min  Short Term Goals: Week 2: SLP Short Term Goal 1 (Week 2): Patient will tolerate trials of Dys 3 mechanical soft solids with min A for clearing oral residuals and mild anterior spillage of solids X2 prior to upgrade. SLP Short Term Goal 2 (Week 2): Pt will demonstrate basic sequencing abilities during ADLs or other functional tasks with Min A verbal/visual cues. SLP Short Term Goal 3 (Week 2): Pt will demonstrate emergent awareness by detecting functional errors with Mod A multimodal cues. SLP Short Term Goal 4 (Week 2): Pt will recall 2 safety precautions with Min A verbal/visual cues for aids and/or strategies. SLP Short Term Goal 5 (Week 2): Pt will sustain attention to functional tasks with Min A verbla/visual cues for redirection. SLP Short Term Goal 6 (Week 2): Pt will demonstrate ability to problem solve basic familiar situations with Mod A verbal/visual cues.  Skilled Therapeutic Interventions:  Skilled therapeutic intervention focused on dysphagia and cognition. Pt seen with dys 3 snack at beginning of session and thin liquids. Demonstrated fully efficient mastication and oral clearance. No anterior loss observed with snack or thin liquids. No overt s/sx of aspiration or penetration noted this session. Mod A with verbal cues and visual cues required to detect errors with color block matching task. Pt sustained attention to match blocks with  designs on picture card shown to him with mod a for error awareness. Cont with therapy per plan of care.     Pain Pain Assessment Pain Scale: Faces Faces Pain Scale: No hurt  Therapy/Group: Individual Therapy  Zachary Flores 09/24/2020, 10:28 AM

## 2020-09-25 NOTE — Progress Notes (Signed)
[place] PHYSICAL MEDICINE & REHABILITATION PROGRESS NOTE   Subjective/Complaints: No complaints this morning. Denies pain, constipation, insomnia.    ROS: Limited due to cognition, but appears to deny CP, shortness of breath, nausea, vomiting, diarrhea.  Objective:   No results found. Recent Labs    09/24/20 0618  WBC 6.4  HGB 14.1  HCT 43.2  PLT 613*   Recent Labs    09/24/20 0618  NA 138  K 3.7  CL 101  CO2 28  GLUCOSE 99  BUN 11  CREATININE 0.80  CALCIUM 9.7    Intake/Output Summary (Last 24 hours) at 09/25/2020 1512 Last data filed at 09/25/2020 1252 Gross per 24 hour  Intake 652 ml  Output 200 ml  Net 452 ml     Pressure Injury 09/18/20 Buttocks Left;Upper Stage 2 -  Partial thickness loss of dermis presenting as a shallow open injury with a red, pink wound bed without slough. open blister (Active)  09/18/20 2200  Location: Buttocks  Location Orientation: Left;Upper  Staging: Stage 2 -  Partial thickness loss of dermis presenting as a shallow open injury with a red, pink wound bed without slough.  Wound Description (Comments): open blister  Present on Admission: No     Pressure Injury 09/18/20 Buttocks Left;Lower Stage 2 -  Partial thickness loss of dermis presenting as a shallow open injury with a red, pink wound bed without slough. open blister (Active)  09/18/20 2201  Location: Buttocks  Location Orientation: Left;Lower  Staging: Stage 2 -  Partial thickness loss of dermis presenting as a shallow open injury with a red, pink wound bed without slough.  Wound Description (Comments): open blister  Present on Admission: No    Physical Exam: Vital Signs Blood pressure 110/70, pulse 60, temperature 98.4 F (36.9 C), resp. rate 20, height 5\' 7"  (1.702 m), weight 74.7 kg, SpO2 96 %.  General: Alert and oriented x 3, No apparent distress HEENT: Head is normocephalic, atraumatic, PERRLA, EOMI, sclera anicteric, oral mucosa pink and moist, dentition  intact, ext ear canals clear,  Neck: Supple without JVD or lymphadenopathy Heart: Reg rate and rhythm. No murmurs rubs or gallops Chest: CTA bilaterally without wheezes, rales, or rhonchi; no distress Abdomen: Soft, non-tender, non-distended, bowel sounds positive. Extremities: No clubbing, cyanosis, or edema. Pulses are 2+ Skin: Warm and dry.  Left lower and upper buttocks stage 2 partial thickness loss of pink wound bed without slough.  Psych: Normal mood.  Normal behavior. Musc: No edema in extremities.  No tenderness in extremities. Neuro: Alert Dysarthria, unchanged Follows simple commands.   Limited awareness of deficits  Left neglect Left sided apraxia, persistent Motor: RUE: 4+/5 proximal distal, unchanged RLE: 4-4+/5 proximal distal LUE: 4/5 proximal distal LLE: Hip flexion, knee extension 4/5, ankle dorsiflexion 3/5  Left upper extremity dysmetria   Assessment/Plan: 1. 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  Physiatrist is providing close team supervision and 24 hour management of active medical problems listed below.  Physiatrist and rehab team continue to assess barriers to discharge/monitor patient progress toward functional and medical goals   Medical Problem List and Plan: 1.  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  Antithrombotics: -DVT/anticoagulation: SCDs             -antiplatelet therapy: N/A 3. Pain Management: Tylenol as needed.  Well controlled 4. Mood: Advised emotional support             -antipsychotic agents: Seroquel 25 mg nightly DC'd 5. Neuropsych: This patient is not fully capable of making decisions on his own behalf. 6. Skin/Wound Care: foam dressing, pressure relief, education for buttock wounds 7.  Fluids/Electrolytes/Nutrition: Routine in and outs.    BMP within normal limits on 10/1 8.  Post stroke dysphagia.      Advanced to D3 thins   Continue to advance diet as tolerated 9.  History of hypertension, now with hypotension.    Lopressor 25 mg twice daily, decreased to 12.5 twice daily on 9/28, DC'd on 9/30  Lisinopril DC'd Vitals:   09/25/20 0405 09/25/20 1317  BP: (!) 127/93 110/70  Pulse: (!) 53 60  Resp: 18 20  Temp: 98 F (36.7 C) 98.4 F (36.9 C)  SpO2: 97% 96%   10/2: diastolic elevated: continue to monitor.  10.  Polysubstance abuse-alcohol, tobacco, cocaine abuse.  Monitor for any signs of withdrawal.    Counsel 11. Urinary retention:   Improved 12.  Thrombocytosis  Platelets 613 on 10/1, labs ordered for Monday   LOS: 10 days A FACE TO FACE EVALUATION WAS PERFORMED  Clint Bolder P Ninoshka Wainwright 09/25/2020, 3:12 PM

## 2020-09-26 ENCOUNTER — Inpatient Hospital Stay (HOSPITAL_COMMUNITY): Payer: Self-pay | Admitting: Speech Pathology

## 2020-09-26 ENCOUNTER — Inpatient Hospital Stay (HOSPITAL_COMMUNITY): Payer: Self-pay | Admitting: Physical Therapy

## 2020-09-26 NOTE — Progress Notes (Signed)
[place] PHYSICAL MEDICINE & REHABILITATION PROGRESS NOTE   Subjective/Complaints: No complaints this morning. Denies pain, constipation, insomnia.     ROS: Limited due to cognition, but appears to deny CP, shortness of breath, nausea, vomiting, diarrhea.  Objective:   No results found. Recent Labs    09/24/20 0618  WBC 6.4  HGB 14.1  HCT 43.2  PLT 613*   Recent Labs    09/24/20 0618  NA 138  K 3.7  CL 101  CO2 28  GLUCOSE 99  BUN 11  CREATININE 0.80  CALCIUM 9.7    Intake/Output Summary (Last 24 hours) at 09/26/2020 1431 Last data filed at 09/26/2020 1334 Gross per 24 hour  Intake 667 ml  Output 325 ml  Net 342 ml     Pressure Injury 09/18/20 Buttocks Left;Upper Stage 2 -  Partial thickness loss of dermis presenting as a shallow open injury with a red, pink wound bed without slough. open blister (Active)  09/18/20 2200  Location: Buttocks  Location Orientation: Left;Upper  Staging: Stage 2 -  Partial thickness loss of dermis presenting as a shallow open injury with a red, pink wound bed without slough.  Wound Description (Comments): open blister  Present on Admission: No     Pressure Injury 09/18/20 Buttocks Left;Lower Stage 2 -  Partial thickness loss of dermis presenting as a shallow open injury with a red, pink wound bed without slough. open blister (Active)  09/18/20 2201  Location: Buttocks  Location Orientation: Left;Lower  Staging: Stage 2 -  Partial thickness loss of dermis presenting as a shallow open injury with a red, pink wound bed without slough.  Wound Description (Comments): open blister  Present on Admission: No    Physical Exam: Vital Signs Blood pressure 121/87, pulse (!) 54, temperature 98.3 F (36.8 C), resp. rate 16, height 5\' 7"  (1.702 m), weight 74.7 kg, SpO2 98 %.  General: Alert and oriented x 3, No apparent distress HEENT: Head is normocephalic, patch on left eye Neck: Supple without JVD or lymphadenopathy Heart:  Bradycardic. No murmurs rubs or gallops Chest: CTA bilaterally without wheezes, rales, or rhonchi; no distress Abdomen: Soft, non-tender, non-distended, bowel sounds positive. Extremities: No clubbing, cyanosis, or edema. Pulses are 2+ Skin: Warm and dry.  Left lower and upper buttocks stage 2 partial thickness loss of pink wound bed without slough.  Psych: Normal mood.  Normal behavior. Musc: No edema in extremities.  No tenderness in extremities. Neuro: Alert Dysarthria, unchanged Follows simple commands.   Limited awareness of deficits  Left neglect Left sided apraxia, persistent Motor: RUE: 4+/5 proximal distal, unchanged RLE: 4-4+/5 proximal distal LUE: 4/5 proximal distal LLE: Hip flexion, knee extension 4/5, ankle dorsiflexion 3/5  Left upper extremity dysmetria    Assessment/Plan: 1. 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  Physiatrist is providing close team supervision and 24 hour management of active medical problems listed below.  Physiatrist and rehab team continue to assess barriers to discharge/monitor patient progress toward functional and medical goals   Medical Problem List and Plan: 1.  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  Antithrombotics: -DVT/anticoagulation: SCDs             -antiplatelet therapy: N/A 3. Pain Management: Tylenol as needed. Well controlled 4. Mood: Advised emotional support             -  antipsychotic agents: Seroquel 25 mg nightly DC'd 5. Neuropsych: This patient is not fully capable of making decisions on his own behalf. 6. Skin/Wound Care: foam dressing, pressure relief, education for buttock wounds 7. Fluids/Electrolytes/Nutrition: Routine in and outs.    BMP within normal limits on 10/1 8.  Post stroke dysphagia.       Advanced to D3 thins   Continue to advance diet as tolerated 9.  History of hypertension, now with hypotension.    Lopressor 25 mg twice daily, decreased to 12.5 twice daily on 9/28, DC'd on 9/30  Lisinopril DC'd Vitals:   09/25/20 2025 09/26/20 0321  BP: 128/83 121/87  Pulse: (!) 55 (!) 54  Resp: 18 16  Temp: 98.2 F (36.8 C) 98.3 F (36.8 C)  SpO2: 97% 98%   10/3 well controlled.   10.  Polysubstance abuse-alcohol, tobacco, cocaine abuse.  Monitor for any signs of withdrawal.    Counsel 11. Urinary retention:   Improved 12.  Thrombocytosis  Platelets 613 on 10/1, labs ordered for Monday   LOS: 11 days A FACE TO FACE EVALUATION WAS PERFORMED  Drema Pry Jaela Yepez 09/26/2020, 2:31 PM

## 2020-09-26 NOTE — Progress Notes (Signed)
Speech Language Pathology Daily Session Note  Patient Details  Name: Zachary Flores MRN: [id number] Date of Birth: 1966/04/27  Today's Date: 09/26/2020 SLP Individual Time: 1135-1200 SLP Individual Time Calculation (min): 25 min  Short Term Goals: Week 2: SLP Short Term Goal 1 (Week 2): Patient will tolerate trials of Dys 3 mechanical soft solids with min A for clearing oral residuals and mild anterior spillage of solids X2 prior to upgrade. SLP Short Term Goal 2 (Week 2): Pt will demonstrate basic sequencing abilities during ADLs or other functional tasks with Min A verbal/visual cues. SLP Short Term Goal 3 (Week 2): Pt will demonstrate emergent awareness by detecting functional errors with Mod A multimodal cues. SLP Short Term Goal 4 (Week 2): Pt will recall 2 safety precautions with Min A verbal/visual cues for aids and/or strategies. SLP Short Term Goal 5 (Week 2): Pt will sustain attention to functional tasks with Min A verbla/visual cues for redirection. SLP Short Term Goal 6 (Week 2): Pt will demonstrate ability to problem solve basic familiar situations with Mod A verbal/visual cues.  Skilled Therapeutic Interventions:  Pt was seen for skilled ST targeting cognitive goals.  SLP facilitated the session with a basic card game to address problem solving goals.  Pt was able to plan and execute a problem solving strategy with min assist initially; however, as task progressed and pt became more fatigued he needed increased cuing for error awareness.  SLP provided skilled education related to the effects of fatigue on attention and problem solving.  Pt was left in bed with bed alarm set and call bell within reach.  Continue per current plan of care.    Pain Pain Assessment Pain Scale: 0-10 Pain Score: 0-No pain  Therapy/Group: Individual Therapy  Zachary Flores, Melanee Spry 09/26/2020, 3:13 PM

## 2020-09-26 NOTE — Progress Notes (Signed)
Physical Therapy Session Note  Patient Details  Name: Mishon Blubaugh MRN: [id number] Date of Birth: 05/01/1966  Today's Date: 09/26/2020 PT Individual Time: 0900-0958 PT Individual Time Calculation (min): 58 min   Short Term Goals: Week 1:  PT Short Term Goal 1 (Week 1): Pt will perform bed mobility with modA PT Short Term Goal 2 (Week 1): Pt will maintain unsupported sitting EOB >5 min with modA PT Short Term Goal 3 (Week 1): Pt will perform bed<>chair transfers with modA and LRAD PT Short Term Goal 4 (Week 1): Pt will initiate pre-gait training with modA and LRAD  Skilled Therapeutic Interventions/Progress Updates:  Pt was seen bedside in the am. Pt donned pants with mod A and verbal cues. Pt transferred to edge of bed with mod A and verbal cues. Pt transferred edge of bed to w/c with mod to max A and verbal cues. Pt transported to rehab gym. Placed 5 lbs weights on B ankles to in proprioceptive feedback. Pt performed multiple sit to stand transfers in gym with +2 mod A and verbal cues. Pt ambulated 15 feet with rolling walker (20 lbs on walker) and mod A + 2. Pt returned to room to toilet. Pt transferred sit to stand with mod A + 2. Pt performed toilet transfers with mod A + 2. Pt ambulated without assistive device and +2 mod to max A with verbal cues. Pt returned to gym. Pt ambulated with eva walker (20 lbs added to walker) x 2 with mod A +2 for 100 feet with verbal cues. Pt returned to room. Pt transferred w/c to edge of bed with max A and verbal cues. Pt transferred edge of bed to supine with mod A and verbal cues. Pt left sitting up in bed with all needs with in reach and bed alarm on.    Therapy Documentation Precautions:  Precautions Precautions: Fall Precaution Comments: left sided weakness; monitor BP Restrictions Weight Bearing Restrictions: No General:   Pain: No c/o pain.  Therapy/Group: Individual Therapy  Rayford Halsted 09/26/2020, 12:13 PM

## 2020-09-26 NOTE — Plan of Care (Signed)
  Problem: RH BLADDER ELIMINATION Goal: RH STG MANAGE BLADDER WITH ASSISTANCE Description: STG Manage Bladder With Assistance Patient will manage bladder with mod assistance. Outcome: Not Progressing; ; incontinence

## 2020-09-27 ENCOUNTER — Inpatient Hospital Stay (HOSPITAL_COMMUNITY): Payer: Self-pay | Admitting: Occupational Therapy

## 2020-09-27 ENCOUNTER — Inpatient Hospital Stay (HOSPITAL_COMMUNITY): Payer: Self-pay

## 2020-09-27 ENCOUNTER — Inpatient Hospital Stay (HOSPITAL_COMMUNITY): Payer: Self-pay | Admitting: Speech Pathology

## 2020-09-27 DIAGNOSIS — L89302 Pressure ulcer of unspecified buttock, stage 2: Secondary | ICD-10-CM

## 2020-09-27 LAB — CBC WITH DIFFERENTIAL/PLATELET
Abs Immature Granulocytes: 0.03 10*3/uL (ref 0.00–0.07)
Basophils Absolute: 0.1 10*3/uL (ref 0.0–0.1)
Basophils Relative: 1 %
Eosinophils Absolute: 0.1 10*3/uL (ref 0.0–0.5)
Eosinophils Relative: 2 %
HCT: 46 % (ref 39.0–52.0)
Hemoglobin: 14.9 g/dL (ref 13.0–17.0)
Immature Granulocytes: 1 %
Lymphocytes Relative: 30 %
Lymphs Abs: 1.8 10*3/uL (ref 0.7–4.0)
MCH: 29.6 pg (ref 26.0–34.0)
MCHC: 32.4 g/dL (ref 30.0–36.0)
MCV: 91.3 fL (ref 80.0–100.0)
Monocytes Absolute: 0.6 10*3/uL (ref 0.1–1.0)
Monocytes Relative: 10 %
Neutro Abs: 3.4 10*3/uL (ref 1.7–7.7)
Neutrophils Relative %: 56 %
Platelets: 623 10*3/uL — ABNORMAL HIGH (ref 150–400)
RBC: 5.04 MIL/uL (ref 4.22–5.81)
RDW: 13.2 % (ref 11.5–15.5)
WBC: 6 10*3/uL (ref 4.0–10.5)
nRBC: 0 % (ref 0.0–0.2)

## 2020-09-27 NOTE — Progress Notes (Signed)
[place] PHYSICAL MEDICINE & REHABILITATION PROGRESS NOTE   Subjective/Complaints: Patient seen sitting up in his chair brushing his teeth working with therapy this morning.  He states he slept well overnight.  He states he was febrile yesterday.  Confusion noted on further discussion.  ROS: Limited due to cognition, but appears to deny CP, shortness of breath, nausea, vomiting, diarrhea.  Objective:   No results found. Recent Labs    09/27/20 0610  WBC 6.0  HGB 14.9  HCT 46.0  PLT 623*   No results for input(s): NA, K, CL, CO2, GLUCOSE, BUN, CREATININE, CALCIUM in the last 72 hours.  Intake/Output Summary (Last 24 hours) at 09/27/2020 1255 Last data filed at 09/27/2020 1235 Gross per 24 hour  Intake 417 ml  Output 150 ml  Net 267 ml     Pressure Injury 09/18/20 Buttocks Left;Upper Stage 2 -  Partial thickness loss of dermis presenting as a shallow open injury with a red, pink wound bed without slough. open blister (Active)  09/18/20 2200  Location: Buttocks  Location Orientation: Left;Upper  Staging: Stage 2 -  Partial thickness loss of dermis presenting as a shallow open injury with a red, pink wound bed without slough.  Wound Description (Comments): open blister  Present on Admission: No     Pressure Injury 09/18/20 Buttocks Left;Lower Stage 2 -  Partial thickness loss of dermis presenting as a shallow open injury with a red, pink wound bed without slough. open blister (Active)  09/18/20 2201  Location: Buttocks  Location Orientation: Left;Lower  Staging: Stage 2 -  Partial thickness loss of dermis presenting as a shallow open injury with a red, pink wound bed without slough.  Wound Description (Comments): open blister  Present on Admission: No    Physical Exam: Vital Signs Blood pressure (!) 130/95, pulse (!) 55, temperature 98.1 F (36.7 C), resp. rate 18, height 5\' 7"  (1.702 m), weight 74.7 kg, SpO2 100 %.  Constitutional: No distress . Vital signs  reviewed. HENT: Normocephalic.  Atraumatic. Eyes: EOMI. No discharge. Cardiovascular: No JVD.  RRR. Respiratory: Normal effort.  No stridor.  Bilateral clear to auscultation. GI: Non-distended.  BS +. Skin: Warm and dry.  Buttock wounds not examined today Psych: Normal mood.  Normal behavior. Musc: No edema in extremities.  No tenderness in extremities. Neuro: Alert Dysarthria, stable Follows simple commands.   Limited awareness of deficits  Left neglect, persistent Left sided apraxia, persistent Motor: RUE: 4+/5 proximal distal, unchanged RLE: 4-4+/5 proximal distal LUE: 4+/5 proximal distal LLE: Hip flexion, knee extension 4/5, ankle dorsiflexion 3/5  Left upper extremity dysmetria and apraxia, unchanged  Assessment/Plan: 1. 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  Physiatrist is providing close team supervision and 24 hour management of active medical problems listed below.  Physiatrist and rehab team continue to assess barriers to discharge/monitor patient progress toward functional and medical goals   Medical Problem List and Plan: 1.  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  Antithrombotics: -DVT/anticoagulation: SCDs             -antiplatelet therapy: N/A 3. Pain Management: Tylenol as needed. Well controlled 4. Mood: Advised emotional support             -antipsychotic agents: Seroquel 25 mg nightly DC'd 5. Neuropsych: This patient is not fully capable  of making decisions on his own behalf. 6. Skin/Wound Care: foam dressing, continue pressure relief 7. Fluids/Electrolytes/Nutrition: Routine in and outs.    BMP within normal limits on 10/1 8.  Post stroke dysphagia.      Advanced to D3 thins  Continue to advance diet as tolerated 9.  History of  hypertension, now with hypotension.    Lopressor 25 mg twice daily, decreased to 12.5 twice daily on 9/28, DC'd on 9/30  Lisinopril DC'd Vitals:   09/26/20 1954 09/27/20 0526  BP: 113/80 (!) 130/95  Pulse: 62 (!) 55  Resp:    Temp: 99 F (37.2 C) 98.1 F (36.7 C)  SpO2: 97% 100%   Relatively controlled on 10/4, elevated diastolic pressures 10.  Polysubstance abuse-alcohol, tobacco, cocaine abuse.  Monitor for any signs of withdrawal.    Counsel 11. Urinary retention:   Improved 12.  Thrombocytosis  Platelets 623 on 10/4, continue to monitor   LOS: 12 days A FACE TO FACE EVALUATION WAS PERFORMED  Jakobie Henslee [name] 09/27/2020, 12:55 PM

## 2020-09-27 NOTE — Progress Notes (Signed)
Speech Language Pathology Daily Session Note  Patient Details  Name: Zachary Flores MRN: [id number] Date of Birth: 01-02-1966  Today's Date: 09/27/2020 SLP Individual Time: 1300-1355 SLP Individual Time Calculation (min): 55 min  Short Term Goals: Week 2: SLP Short Term Goal 1 (Week 2): Patient will tolerate trials of Dys 3 mechanical soft solids with min A for clearing oral residuals and mild anterior spillage of solids X2 prior to upgrade. SLP Short Term Goal 2 (Week 2): Pt will demonstrate basic sequencing abilities during ADLs or other functional tasks with Min A verbal/visual cues. SLP Short Term Goal 3 (Week 2): Pt will demonstrate emergent awareness by detecting functional errors with Mod A multimodal cues. SLP Short Term Goal 4 (Week 2): Pt will recall 2 safety precautions with Min A verbal/visual cues for aids and/or strategies. SLP Short Term Goal 5 (Week 2): Pt will sustain attention to functional tasks with Min A verbla/visual cues for redirection. SLP Short Term Goal 6 (Week 2): Pt will demonstrate ability to problem solve basic familiar situations with Mod A verbal/visual cues.  Skilled Therapeutic Interventions: Pt was seen for skilled ST targeting cognitive goals. Pt engaged in functional conversation, during which he identified potential safety issues/problems and solutions in regards to fall risk and prevention with Min A verbal cues. Pt also recalled transfer methods and safety precautions within transfers to prevent falls with Min A question cues. During a basic weekly weather forecast activity, pt required Moderate verbal and visual cues for comprehending and interpreting basic information, problem solving, and error awareness. SLP further facilitated session with a simplified card task in which pt required a written aid and Max faded to Mod A verbal and visual cues for problem solving and error awareness. Only Min A verbal cues required for sustained attention to tasks until  last 15 mins of session, during which he required increased Mod A verbal cues. Pt left sitting in chair with alarm set and needs within reach. Continue per current plan of care.          Pain Pain Assessment Pain Scale: 0-10 Pain Score: 0-No pain  Therapy/Group: Individual Therapy  Zachary Flores 09/27/2020, 3:04 PM

## 2020-09-27 NOTE — Progress Notes (Signed)
Physical Therapy Session Note  Patient Details  Name: Zachary Flores MRN: [id number] Date of Birth: 12-12-1966  Today's Date: 09/27/2020 PT Individual Time: 0800-0900 + 1500-1525 PT Individual Time Calculation (min): 60 min + 25 min  Short Term Goals: Week 2:  PT Short Term Goal 1 (Week 2): Pt will perform bed mobility with minA PT Short Term Goal 2 (Week 2): Pt will maintain unsupported sitting balance with no more than minA PT Short Term Goal 3 (Week 2): Pt will perform bed<>chair transfers with minA and LRAD PT Short Term Goal 4 (Week 2): Pt will ambulate 39ft with modA and LRAD  Skilled Therapeutic Interventions/Progress Updates:   1st session:   Pt received supine in bed, awake and agreeable to PT session. Pt denies pain, eager to start therapy. Donned TED's and shoes with totalA while supine for time management, maxA for putting on pants via bridging technique. Supine<>sit with modA towards L EOB with HOB flat, using bed rail. Able to sit EOB with CGA, unsupported. Doffed shirt with minA, required modA for donning t-shirt for finding arm/head holes. Performed stand<>pivot with modA from EOB to TIS w/c, cues for safety, technique, and sequencing. Pt with poor safety awareness during transfer, attempting to pivot prior to being closer to w/c and sitting prematurely. Pt requesting to brush his teeth, therefore wheeled sinkside for oral care and to wash his face. Used his RUE predominately to brush his teeth and wash his face while he stayed seated in w/c. Wheeled with totalA to dayroom gym for time management and energy conservation. Performed gait training on LiteGait with modA +2 for LLE advancement.placement and lateral weight shift. Pt with great difficulty maintaining LUE grasp on hand rail during gait and had significantly poor trunk control; also had difficulty keeping up with treadmill speed (0.58mph). He ambulated for 2 min:45 seconds for 69 ft + 52min45 seconds for 50ft + 12min30seconds  for 51ft. (seated rest breaks provided b/w trials). Pt reporting at first he had difficulty with gait due to "a lot going on" but then later reports improvement as he felt more comfortable with the machine. Pt wheeled back to his room for time management and ended session reclined in ITS w/c, belt alarm on, needs in reach.   2nd session:  Pt received sitting in w/c, agreeable to PT session, denies any pain however he reports he voided in brief. Wheeled in w/c to his bathroom for pericare. Performed sit<>stand with minA from w/c and required modA for stand<>pivot to face toilet, using RUE to grab bar. Maintained standing with RUE on grab with minA, totalA for doffing brief/pants for time management. Unsuccessful at voiding, therefore provided washcloths for posterior and anterior pericare with RUE, requiring min/modA for standing balance. Stand<>pivot with modA back to his w/c and wheeled sinkside for hand hygiene, limited by motor BUE apraxia/ataxia. W/c transport with totalA for time management to main hallway to perform gait training where he ambulated 36ft with modA and RUE to HR. Pt noted to by moderately impulsive throughout functional mobility tasks and during gait training, requiring frequent cues for pacing, safety awareness, and controlled activity; he verbalized understanding. Wheeled back to his room, stand<>pivot with modA from w/c to EOB, minA for trunk control and BLE management with sit>supine. Ended session semi-reclined with needs in reach, bed alarm on, pt made comfortable.    Therapy Documentation Precautions:  Precautions Precautions: Fall Precaution Comments: left sided weakness; monitor BP Restrictions Weight Bearing Restrictions: No  Therapy/Group: Individual Therapy  [name]  P [name] PT 09/27/2020, 8:58 AM

## 2020-09-27 NOTE — Plan of Care (Signed)
  Problem: Consults °Goal: RH GENERAL PATIENT EDUCATION °Description: Patient will gain knowledge of disease management, pain management bowel, and bladder management during this admission. °Outcome: Progressing °Goal: Skin Care Protocol Initiated - if Braden Score 18 or less °Description: If consults are not indicated, leave blank or document N/A °Outcome: Progressing °Goal: Nutrition Consult-if indicated °Outcome: Progressing °  °Problem: RH BOWEL ELIMINATION °Goal: RH STG MANAGE BOWEL WITH ASSISTANCE °Description: STG Manage Bowel with Assistance. °Outcome: Progressing °  °Problem: RH BLADDER ELIMINATION °Goal: RH STG MANAGE BLADDER WITH ASSISTANCE °Description: STG Manage Bladder With Assistance °Patient will manage bladder with mod assistance. °Outcome: Progressing °Goal: RH STG MANAGE BLADDER WITH EQUIPMENT WITH ASSISTANCE °Description: STG Manage Bladder With Equipment With Assistance °Outcome: Progressing °  °Problem: RH SKIN INTEGRITY °Goal: RH STG SKIN FREE OF INFECTION/BREAKDOWN °Description: Patients skin will be free of infection and break down during this admission. °Outcome: Progressing °  °Problem: RH SAFETY °Goal: RH STG ADHERE TO SAFETY PRECAUTIONS W/ASSISTANCE/DEVICE °Description: STG Adhere to Safety Precautions With Assistance/Device. °Patient will adhere to safety plan and will not have any fall this admission. °Outcome: Progressing °  °Problem: RH PAIN MANAGEMENT °Goal: RH STG PAIN MANAGED AT OR BELOW PT'S PAIN GOAL °Description: Patients pain will be managed at or below the level of 3 on a 0-10 pain scale during this admission. °Outcome: Progressing °  °Problem: RH KNOWLEDGE DEFICIT GENERAL °Goal: RH STG INCREASE KNOWLEDGE OF SELF CARE AFTER HOSPITALIZATION °Description: Patient will gain knowledge of self care during this admission. °Outcome: Progressing °  °

## 2020-09-27 NOTE — Progress Notes (Signed)
Occupational Therapy Session Note  Patient Details  Name: Zachary Flores MRN: [id number] Date of Birth: Mar 13, 1966  Today's Date: 09/27/2020 OT Individual Time: 0958-1100 OT Individual Time Calculation (min): 62 min    Short Term Goals: Week 2:  OT Short Term Goal 1 (Week 2): Pt will complete SPT toilet transfer with min assist OT Short Term Goal 2 (Week 2): Pt will complete LB dressing with mod assist OT Short Term Goal 3 (Week 2): Pt will complete LB bathing with mod assist OT Short Term Goal 4 (Week 2): Pt will complete UB dressing with supervision.  Skilled Therapeutic Interventions/Progress Updates:    Pt sitting up in w/c, no c/o pain, agreeable to bathing at shower level during OT session.  Pt requesting to toilet prior to bathing.  Pt completed SPT w/c to 3 in 1 commode using grab bars and requiring mod assist to ensure safety due to ataxic movements during transfer.  Pt had continent episode of bowel and bladder with CGA while sitting on commode and hand over hand to pts left hand to stabilize and reduce ataxia.  Toileting completed with max assist for pericare and clothing management in standing.  Pants and brief doffed over feet with max assist.  Pt completed SPT toilet to w/c and w/c to shower bench in walk-in shower using grab bars with mod assist and step by step VCs to reduce impulsive mobility and improve BUE and BLE placement. Shirt doffed with supervision.   Pt bathed UB with CGA and LB with min assist.  SPT TTB to w/c with mod assist.  Pt brushed teeth with setup sitting sinkside. Pt donned shirt with supervision.  Brief and pants donned with max assist.  Pt was able to stand at sink with CGA while OT pulled brief and pants over hips. Max assist needed to donn socks and shoes seated in w/c.  Pt exhibited improved independence with functional SPT from various surfaces today from max assist to mod assist.  Call bell in reach, seat belt alarm donned.  Therapy  Documentation Precautions:  Precautions Precautions: Fall Precaution Comments: left sided weakness; monitor BP Restrictions Weight Bearing Restrictions: No   Therapy/Group: Individual Therapy  Amie Critchley 09/27/2020, 11:34 AM

## 2020-09-27 NOTE — Progress Notes (Signed)
Restless and cooperative, appears more confused and disoriented, reoriented to place, time and surrounding, Noted patient was throwing his legs over bed railing and completely turned himself to bottom of bed earlier in shift.Reoriented with Incontinent care provided, po liquids provided, made as comfortable as possible. Closely monitor

## 2020-09-28 ENCOUNTER — Encounter (HOSPITAL_COMMUNITY): Payer: Self-pay | Admitting: Speech Pathology

## 2020-09-28 ENCOUNTER — Ambulatory Visit (HOSPITAL_COMMUNITY): Payer: Self-pay

## 2020-09-28 ENCOUNTER — Encounter (HOSPITAL_COMMUNITY): Payer: Self-pay | Admitting: Occupational Therapy

## 2020-09-28 ENCOUNTER — Inpatient Hospital Stay (HOSPITAL_COMMUNITY): Payer: Self-pay

## 2020-09-28 NOTE — Progress Notes (Signed)
Occupational Therapy Session Note  Patient Details  Name: Zachary Flores MRN: [id number] Date of Birth: [birth year]  Today's Date: 09/28/2020 OT Individual Time: 5573-2202 OT Individual Time Calculation (min): 42 min    Short Term Goals: Week 2:  OT Short Term Goal 1 (Week 2): Pt will complete SPT toilet transfer with min assist OT Short Term Goal 2 (Week 2): Pt will complete LB dressing with mod assist OT Short Term Goal 3 (Week 2): Pt will complete LB bathing with mod assist OT Short Term Goal 4 (Week 2): Pt will complete UB dressing with supervision.  Skilled Therapeutic Interventions/Progress Updates:    Pt sitting up in w/c, no c/o pain, agreeable to OT session.  Pt requesting to brush teeth at beginning of session.  OT transported pt to sinkside.  Pt able to hold toothpaste in left hand with min hand over hand and unscrew top with right hand.  Pt applied toothpaste with right hand however initially overshooting distance to toothbrush and needing VCs to facilitate improved visual scanning and attention to toothbrush location.  Pt brushed teeth with setup overall.  Pt then participated in functional reach/grasp/place task at tabletop level with 3lb wrist weight applied to left.  Pt provided cues for gripping at appropriate times, to slow pacing of movement, and visual scanning and attention.  Pt able to grasp and place medium lego pieces from table to container with min assist.  Pt then completed leg (knee<>hip) and chest (naval<>chin, naval<>right shoulder) slides LUE to facilitate tactile feedback for increased proprioceptive input.  Pt exhibited increased motor control with UE close to body.  Soft call bell in reach, seat belt alarm on.   Therapy Documentation Precautions:  Precautions Precautions: Fall Precaution Comments: left sided weakness; monitor BP Restrictions Weight Bearing Restrictions: No   Therapy/Group: Individual Therapy  Amie Critchley 09/28/2020, 4:29 PM

## 2020-09-28 NOTE — Progress Notes (Signed)
Physical Therapy Session Note  Patient Details  Name: Zachary Flores MRN: [id number] Date of Birth: [birth year]  Today's Date: 09/28/2020 PT Individual Time: 0915-1030 + 1415-1510 PT Individual Time Calculation (min): 75 min + 55 min  Short Term Goals: Week 2:  PT Short Term Goal 1 (Week 2): Pt will perform bed mobility with minA PT Short Term Goal 2 (Week 2): Pt will maintain unsupported sitting balance with no more than minA PT Short Term Goal 3 (Week 2): Pt will perform bed<>chair transfers with minA and LRAD PT Short Term Goal 4 (Week 2): Pt will ambulate 74ft with modA and LRAD  Skilled Therapeutic Interventions/Progress Updates:     1st session Pt received supine in bed, awake and agreeable to PT session; denies any pain. Donned TED's and shoes with totalA for time management while supine. Supine<>sit with minA and HOB flat, use of bed rail. Required initial minA for sitting balance, progressing to CGA. Stand<>pivot with modA from EOB to w/c, towards more involved L side. Pt requesting to brush his teeth, therefore he was wheeled sinkside in w/c to perform oral care which was done using his RUE with setupA. WC transport for time management and energy conservation to main hallway where focus of session was to continue gait training. He ambulated 56ft + 75t with modA +2 using Fara Boros. Ace wrapped LUE to grab rail on walker due to significant apraxia.Therapy tech in front stabilizing Eva walker while therapist was posterior assisting with L foot placement, lateral weight shifts, and cadence. With gait, demo's significant BLE ataxia, LLE > RLE, inconsistent step length, increased cadence, forward trunk lean on Eva walker. Moderate impulsivity throughout functional tasks.Trials of using 3#ankle weights on both ankles to assist with proprioceptive feedback however this appeared to hinder gait more than aid. Also used weighted vest on Fara Boros to assist with slowing him down. Pt reporting urge  to void, therefore Pt was transported back to his room. While seated, therapist managed urinal while he was continent of voiding. Ended session seated in w/c with belt alarm on, needs in reach.   2nd session: Pt received seated in w/c, agreeable to PT session, denies any pain. Scheduled for family ed but no family present, will continue efforts to arrange with CSW. WC transport with totalA for time management from his room to main therapy gym. Stand<>pivot transfer with mod/maxA from w/c to mat table, cues for safety, sequencing, and technique. He continues to be moderately impulsive with functional mobility tasks, required frequent reminders for safety and slowing down his activity. He was able to maintain unsupported sitting at edge of mat table with CGA/close SBA. Performed neuro re-ed using card matching on back of mirror with therapist providing minA guard and pt using RUE during 3/4 trials. He required hand-over-hand assist for LUE card grasp during 1/4 trials and placement to match accurately due to ataxia. Then performed sit<>stand tasks with card matching, focusing on sit<>stand transfer technique, RUE coordination, and reaching outside BOS while standing. Then performed sit<>stand transfers again using horseshoes over basketball rim, using LUE this time to focus on apraxia/ataxia and standing balance. He required min/modA guard throughout for these functional tasks. Pt reporting need for toilet to have BM. Stand<>pivot with modA from mat table back to w/c and transported to his room with totalA for time management. Performed stand<>pivot with modA from w/c to toilet, pt using BUE's on grab bar to assist. Again, pt impulsive with tasks and requiring frequent reminders for safety and slowing  down activity. He was continent of bowel/bladder while seated on toilet, NT called for +2 assist for safety. NT performed totalA for pericare while therapist providing minA guard while pt stood. Stand<>pivot with modA  +2 back to his w/c and then stand<>pivot again from w/c to EOB with modA. Sit>supine with minA using bed features. +2 NT present at end of session to assist with brief change and finalize pericare.   Therapy Documentation Precautions:  Precautions Precautions: Fall Precaution Comments: left sided weakness; monitor BP Restrictions Weight Bearing Restrictions: No  Therapy/Group: Individual Therapy  Jaqualin Serpa P Lyvia Mondesir PT 09/28/2020, 8:01 AM

## 2020-09-28 NOTE — Progress Notes (Signed)
[facility] PHYSICAL MEDICINE & REHABILITATION PROGRESS NOTE   Subjective/Complaints: Patient seen sitting up in bed this morning.  He states he slept well overnight.  He states he is doing better.  ROS: Limited due to cognition, but appears to deny CP, shortness of breath, nausea, vomiting, diarrhea.  Objective:   No results found. Recent Labs    09/27/20 0610  WBC 6.0  HGB 14.9  HCT 46.0  PLT 623*   No results for input(s): NA, K, CL, CO2, GLUCOSE, BUN, CREATININE, CALCIUM in the last 72 hours.  Intake/Output Summary (Last 24 hours) at 09/28/2020 1115 Last data filed at 09/28/2020 1010 Gross per 24 hour  Intake 560 ml  Output 250 ml  Net 310 ml     Pressure Injury 09/18/20 Buttocks Left;Upper Stage 2 -  Partial thickness loss of dermis presenting as a shallow open injury with a red, pink wound bed without slough. open blister (Active)  09/18/20 2200  Location: Buttocks  Location Orientation: Left;Upper  Staging: Stage 2 -  Partial thickness loss of dermis presenting as a shallow open injury with a red, pink wound bed without slough.  Wound Description (Comments): open blister  Present on Admission: No     Pressure Injury 09/18/20 Buttocks Left;Lower Stage 2 -  Partial thickness loss of dermis presenting as a shallow open injury with a red, pink wound bed without slough. open blister (Active)  09/18/20 2201  Location: Buttocks  Location Orientation: Left;Lower  Staging: Stage 2 -  Partial thickness loss of dermis presenting as a shallow open injury with a red, pink wound bed without slough.  Wound Description (Comments): open blister  Present on Admission: No    Physical Exam: Vital Signs Blood pressure 117/81, pulse (!) 58, temperature 98.3 F (36.8 C), resp. rate 18, height 5\' 7"  (1.702 m), weight 74.7 kg, SpO2 99 %.  Constitutional: No distress . Vital signs reviewed. HENT: Normocephalic.  Atraumatic. Eyes: EOMI. No discharge. Cardiovascular: No JVD.   RRR. Respiratory: Normal effort.  No stridor.  Bilateral clear to auscultation. GI: Non-distended.  BS +. Skin: Warm and dry.  Buttock wounds not examined today Psych: Normal mood.  Normal behavior. Musc: No edema in extremities.  No tenderness in extremities. Neuro: Alert Dysarthria, unchanged Follows simple commands.   Limited awareness of deficits  Left neglect, unchanged Left sided apraxia, unchanged Motor: RUE: 4+/5 proximal distal, unchanged RLE: 4-4+/5 proximal distal LUE: 4+/5 proximal distal LLE: Hip flexion, knee extension 4/5, ankle dorsiflexion 3/5  Left upper extremity dysmetria and apraxia, unchanged  Assessment/Plan: 1. 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  Physiatrist is providing close team supervision and 24 hour management of active medical problems listed below.  Physiatrist and rehab team continue to assess barriers to discharge/monitor patient progress toward functional and medical goals    Medical Problem List and Plan: 1.  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  Antithrombotics: -DVT/anticoagulation: SCDs             -antiplatelet therapy: N/A 3. Pain Management: Tylenol as needed.   Controlled on 10/5 4. Mood: Advised emotional support             -antipsychotic agents: Seroquel 25 mg nightly DC'd 5. Neuropsych: This patient is not fully capable of making decisions on his own behalf. 6. Skin/Wound Care:  foam dressing, continue pressure relief 7. Fluids/Electrolytes/Nutrition: Routine in and outs.    BMP within normal limits on 10/1 8.  Post stroke dysphagia.      Advanced to D3 thins  Overall good p.o. intake  Continue to advance diet as tolerated 9.  History of hypertension, now with hypotension.    Lopressor 25 mg twice  daily, decreased to 12.5 twice daily on 9/28, DC'd on 9/30  Lisinopril DC'd Vitals:   09/27/20 1912 09/28/20 0349  BP: 106/69 117/81  Pulse: 66 (!) 58  Resp:    Temp: 98.3 F (36.8 C) 98.3 F (36.8 C)  SpO2: 99% 99%   Soft at times, relatively controlled on 10/5 10.  Polysubstance abuse-alcohol, tobacco, cocaine abuse.  Monitor for any signs of withdrawal.    Counsel 11. Urinary retention:   Improved 12.  Thrombocytosis  Platelets 623 on 10/4, plan order labs at the end of this week   LOS: 13 days A FACE TO FACE EVALUATION WAS PERFORMED  Aaryanna Hyden [name] 09/28/2020, 11:15 AM

## 2020-09-28 NOTE — Progress Notes (Signed)
Speech Language Pathology Daily Session Note  Patient Details  Name: Zachary Flores MRN: [id number] Date of Birth: Sep 23, 1966  Today's Date: 09/28/2020 SLP Individual Time: 6834-1962 SLP Individual Time Calculation (min): 27 min  Short Term Goals: Week 2: SLP Short Term Goal 1 (Week 2): Patient will tolerate trials of Dys 3 mechanical soft solids with min A for clearing oral residuals and mild anterior spillage of solids X2 prior to upgrade. SLP Short Term Goal 2 (Week 2): Pt will demonstrate basic sequencing abilities during ADLs or other functional tasks with Min A verbal/visual cues. SLP Short Term Goal 3 (Week 2): Pt will demonstrate emergent awareness by detecting functional errors with Mod A multimodal cues. SLP Short Term Goal 4 (Week 2): Pt will recall 2 safety precautions with Min A verbal/visual cues for aids and/or strategies. SLP Short Term Goal 5 (Week 2): Pt will sustain attention to functional tasks with Min A verbla/visual cues for redirection. SLP Short Term Goal 6 (Week 2): Pt will demonstrate ability to problem solve basic familiar situations with Mod A verbal/visual cues.  Skilled Therapeutic Interventions: Pt was seen for skilled ST targeting cognitive goals. SLP facilitated session with a basic grocery ad search and budgeting/calculations task. Pt only required Min A verbal cues for redirection to sustain his attention to tasks and functional conversation topics today. He also only required Min A verbal cues to locate items within the grocery ad, however increased Mod A verbal cues required for problem solving and calculations. Pt demonstrated excellent recall of rehab goals and previous activities with only Supervision A verbal cues. Pt left sitting in tilt in space chair with alarm set and needs within reach. Continue per current plan of care.          Pain Pain Assessment Pain Scale: 0-10 Pain Score: 0-No pain   Therapy/Group: Individual Therapy  Zachary Flores 09/28/2020, 3:06 PM

## 2020-09-29 ENCOUNTER — Inpatient Hospital Stay (HOSPITAL_COMMUNITY): Payer: Self-pay

## 2020-09-29 ENCOUNTER — Inpatient Hospital Stay (HOSPITAL_COMMUNITY): Payer: Self-pay | Admitting: Speech Pathology

## 2020-09-29 ENCOUNTER — Inpatient Hospital Stay (HOSPITAL_COMMUNITY): Payer: Self-pay | Admitting: Occupational Therapy

## 2020-09-29 ENCOUNTER — Encounter (HOSPITAL_COMMUNITY): Payer: Self-pay | Admitting: Psychology

## 2020-09-29 NOTE — Patient Care Conference (Addendum)
Inpatient RehabilitationTeam Conference and Plan of Care Update 11Date: 09/29/2020   Time:  11:07 AM   Patient Name: Zachary Flores      Medical Record Number: [id number]  Date of Birth: 30-Mar-1966 Sex: Male         Room/Bed: 4W20C/4W20C-01 Payor Info: Payor: MEDICAID POTENTIAL / Plan: MEDICAID POTENTIAL / Product Type: *No Product type* /    Admit Date/Time:  09/15/2020  4:24 PM  Primary Diagnosis:  Pontine hemorrhage [facility])  Hospital Problems: Principal Problem:   Pontine hemorrhage (HCC) Active Problems:   Right pontine cerebrovascular accident (HCC)   Pressure injury of skin   Urinary retention   History of hypertension   Drug-induced hypotension   Thrombocytosis   Hypotension    Expected Discharge Date: Expected Discharge Date:  (SNF pending)  Team Members Present: Physician leading conference: Dr. Sula Soda Care Coodinator Present: Chana Bode, RN, BSN, CRRN;Other (comment) Kriste Basque Dupree, SW) Nurse Present: Other (comment) Alden Benjamin, RN) PT Present: Other (comment) (Christain Manhard, PT) OT Present: Dolphus Jenny, OT SLP Present: Suzzette Righter, CF-SLP PPS Coordinator present : Fae Pippin, SLP     Current Status/Progress Goal Weekly Team Focus  Bowel/Bladder   continent of b/b with episodes of incontences at night; LBM: 10/05  remain continent of b/b  assist with tolieting needs prn   Swallow/Nutrition/ Hydration   Dys 3 textures, thin liquids, full supervision due to cognition and motor planning issues  Supervision  tolerance current diet, regular trials as indicated, independence use swallow strategies   ADL's   mod assist squat pivot/stand pivot transfers, setup/supervision UB ADLs; maxA LB ADLs; reduced ataxia RUE/LUE still ataxic, but slowly improving  min assist overall; may downgrade LB ADL goals soon pending progress  self care training, BUE NMR, functional transfer training   Mobility   modA bed mobility, modA stand<>pivot transfers, gait  ~87ft modA +2 skilled PT  minA  Standing balance, BLE coordination training, DC planning (pt uninsured with drug history), gait training   Communication   Min-Mod A comprehension and intelliigbility strategies  Supervision  carryover speech intelligibility and comprehension strategies   Safety/Cognition/ Behavioral Observations  Mod A  Supervision  basic familiar problem solving, safety and emergent awareness, sustained attention   Pain   no c/o pain  remain pain free  assess skin QS and prn   Skin   healing stage 2 on buttocks with foam in place  remain free of new skin breakdown/infection  assess skin QS and prn     Discharge Planning:  Will need to go to NH if can get an LOG and a bed for pt, does not have a caregiver or a place to go at discharge. Sister has applied for SSD and medicaid   Team Discussion: Urinary retention improved. Making progress with cognition goals and transfers Mod assist. Poor safety, impulsivity and ataxia affecting progress.  Patient on target to meet rehab goals: yes , currently transfers mod assist level  *See Care Plan and progress notes for long and short-term goals.   Revisions to Treatment Plan:  Trial of eva walker, lite-gait, gait exercises, etc. Teaching Needs: Transfers, toileting, medications, etc.  Current Barriers to Discharge: Limited caregiver support, access to home, insurance coverage for SNF; and behavior. Brother willing to help but recently lost home with mother; brother has own health issues and works.  Possible Resolutions to Barriers: Family education SW seeking LOG for SNF coverage     Medical Summary Current Status: Vision getting better, vitals stable,  ataxia, impulsivitiy, loss of balance, positive mood and attitude, history of substance abuse  Barriers to Discharge: Medical stability;Decreased family/caregiver support  Barriers to Discharge Comments: He does not have adequate family support, visual impairments, history of  substance abuse (more difficult to get accepting SNF) Possible Resolutions to [employer] Focus: Will need to find SNF given ataxia, impulsivity, loss of balance, visual impairments, counseling regarding substance abuse, continued folate and thiamine supplementation   Continued Need for Acute Rehabilitation Level of Care: The patient requires daily medical management by a physician with specialized training in physical medicine and rehabilitation for the following reasons: Direction of a multidisciplinary physical rehabilitation program to maximize functional independence : Yes Medical management of patient stability for increased activity during participation in an intensive rehabilitation regime.: Yes Analysis of laboratory values and/or radiology reports with any subsequent need for medication adjustment and/or medical intervention. : Yes   I attest that I was present, lead the team conference, and concur with the assessment and plan of the team.   Chana Bode B 09/29/2020, 1:55 PM

## 2020-09-29 NOTE — Progress Notes (Signed)
Patient ID: Zachary Flores, male   DOB: [birth year], 54 y.o.   MRN: [id number]  Spoke with sister-bessie to update on team conference and pt's progress. Pt is aware of his need for 24 hr care and feels his brother can do it. When informed his brother is staying with friends and can not care of him he has no answer. He feels he can do for himself, which he can not. Discussed going to a SNF he will if he has too and sister feels this is the best option for him she can not assist due to talking care of another family member and she is elderly also. Will see if can get a LOG and work on SNF.

## 2020-09-29 NOTE — Progress Notes (Signed)
Occupational Therapy Session Note  Patient Details  Name: Zachary Flores MRN: [id number] Date of Birth: May 16, 1966  Today's Date: 09/29/2020 OT Individual Time: 1300-1400 OT Individual Time Calculation (min): 60 min    Short Term Goals: Week 2:  OT Short Term Goal 1 (Week 2): Pt will complete SPT toilet transfer with min assist OT Short Term Goal 2 (Week 2): Pt will complete LB dressing with mod assist OT Short Term Goal 3 (Week 2): Pt will complete LB bathing with mod assist OT Short Term Goal 4 (Week 2): Pt will complete UB dressing with supervision.  Skilled Therapeutic Interventions/Progress Updates:    Pt supine in bed, no c/o pain, tired but agreeable to OT session.  Pt requesting to toilet.  Supine to sit with min assist and step by step VCs.  Squat pivot to w/c with mod assist.  Pt transported to bathroom and stand pivot using grab bars to 3 in 1 commode with mod assist.  Pt had continent episode of bowel with CGA sitting on 3 in 1.  Pt stood with CGA and toileting completed with max assist.  Pt completed stand pivot 3in1 to w/c and w/c to TTB using grab bars with mod assist. Shirt doffed with supervision and pants and brief doffed with min assist.  Pt needing cues throughout to slow pace of movement for safety.  Pt bathed UB/LB using long handled sponge as needed with min assist for buttocks.  SPT TTB to w/c with mod assist.  Oral hygiene completed per pt request sitting sinkside with setup to apply toothpaste.  Pt donned shirt with mod assist due to increased ataxic movement BUE today and impaired sequencing and attention.  Pt donned brief and pants with max assist to thread over feet and pull over hips. Pt instructed on figure 4 positioning for donning of socks and completed with max assist.  Pt requesting back to bed at end of session.  SPT w/c to EOB with mod assist.  Sit to supine with min assist.  Soft call bell in reach, bed alarm on.  Pt exhibiting increased impulsiveness and  slightly more ataxic BUE possibly due to fatigue.   Therapy Documentation Precautions:  Precautions Precautions: Fall Precaution Comments: left sided weakness; monitor BP Restrictions Weight Bearing Restrictions: No   Therapy/Group: Individual Therapy  Amie Critchley 09/29/2020, 2:57 PM

## 2020-09-29 NOTE — Progress Notes (Signed)
Speech Language Pathology Daily Session Note  Patient Details  Name: Zachary Flores MRN: [id number] Date of Birth: 1966/04/13  Today's Date: 09/29/2020 SLP Individual Time: 9480-1655 SLP Individual Time Calculation (min): 28 min  Short Term Goals: Week 2: SLP Short Term Goal 1 (Week 2): Patient will tolerate trials of Dys 3 mechanical soft solids with min A for clearing oral residuals and mild anterior spillage of solids X2 prior to upgrade. SLP Short Term Goal 2 (Week 2): Pt will demonstrate basic sequencing abilities during ADLs or other functional tasks with Min A verbal/visual cues. SLP Short Term Goal 3 (Week 2): Pt will demonstrate emergent awareness by detecting functional errors with Mod A multimodal cues. SLP Short Term Goal 4 (Week 2): Pt will recall 2 safety precautions with Min A verbal/visual cues for aids and/or strategies. SLP Short Term Goal 5 (Week 2): Pt will sustain attention to functional tasks with Min A verbla/visual cues for redirection. SLP Short Term Goal 6 (Week 2): Pt will demonstrate ability to problem solve basic familiar situations with Mod A verbal/visual cues.  Skilled Therapeutic Interventions: Pt was seen for skilled ST targeting speech and swallow goals. Pt initially ~65% intelligible when reading at the paragraph level, however with use of eye patch to compensate for visual impairments, pt read a second reading passage with 85% intelligibility and Min A verbal cues for implementation of increased vocal intensity and slower rate of speech. Pt also participated in a picture description task with 85-90% speech intelligibility noted and Supervision A verbal cues for use of strategies. In conversation, most consistently pt still requires Min A verbal cues for awareness of verbal errors and implementation of compensatory speech strategies to achieved ~80-85% intelligibility. SLP further facilitated session with an upgraded trial of regular texture solids (potato chips)  and thin liquids. Pt exhibited 1 immediate cough after large consecutive sips of thin via straw. SLP provided Min A verbal cues and feedback regarding rate of intake and bolus size. Pt's mastication and oral clearance and chips was timely and efficient, although trial was small in quantity (he reported lack of appetite). Would recommend continue current diet and trials of advanced textures with SLP to determine readiness for advancement. Pt left laying in bed with alarm set and needs within reach. Continue per current plan of care.          Pain Pain Assessment Pain Scale: 0-10 Pain Score: 0-No pain   Therapy/Group: Individual Therapy  [name] 09/29/2020, 2:37 PM

## 2020-09-29 NOTE — Progress Notes (Signed)
Occupational Therapy Session Note  Patient Details  Name: Zachary Flores MRN: [id number] Date of Birth: 08/18/1966  Today's Date: 09/29/2020 OT Individual Time: 3112-1624 OT Individual Time Calculation (min): 58 min    Short Term Goals: Week 1:  OT Short Term Goal 1 (Week 1): Pt will complete UB dressing with mod assist OT Short Term Goal 1 - Progress (Week 1): Met OT Short Term Goal 2 (Week 1): Pt will complete UB bathing with min assist OT Short Term Goal 2 - Progress (Week 1): Met OT Short Term Goal 3 (Week 1): Pt will complete self feeding with min assist OT Short Term Goal 3 - Progress (Week 1): Progressing toward goal OT Short Term Goal 4 (Week 1): Pt will be able to stand with mod assist in preperation for LB ADLs OT Short Term Goal 4 - Progress (Week 1): Met  Skilled Therapeutic Interventions/Progress Updates:    1;1. Pt received in bed agreeable to OT. Focus of session on BADL at sink level, functional transfers, HOH A for NMR of LUE to improve ataxia, visual perception and sit to stand/balance. Pt requires A for bathing RUE with LUE with HOH A for NMR. MIN A for sit to stand at sink with facilitation of WB and VC for post weight shifting when washing peri area. UB dressing with MOD A and VC for hemi techniques/VC for slowing down. MAX A for LB dressing provided. MIN HOH A for LUE during bimanual coordination tasks at the sink during oral care. Pt req VC to slow movements.OT installs elastic laces into shoes and donned total A. Exited session with pt seated in bed, exit alram on and call light in reach  Therapy Documentation Precautions:  Precautions Precautions: Fall Precaution Comments: left sided weakness; monitor BP Restrictions Weight Bearing Restrictions: No General:   Vital Signs: Therapy Vitals Temp: 98.1 F (36.7 C) Pulse Rate: (!) 52 BP: 121/87 Patient Position (if appropriate): Lying Oxygen Therapy SpO2: 98 % O2 Device: Room Air Pain:    ADL: ADL Eating: Moderate assistance Where Assessed-Eating: Wheelchair Grooming: Moderate assistance Where Assessed-Grooming: Sitting at sink Upper Body Bathing: Maximal assistance, Maximal cueing Where Assessed-Upper Body Bathing: Sitting at sink Upper Body Dressing: Maximal assistance, Maximal cueing Where Assessed-Upper Body Dressing: Sitting at sink Vision   Perception    Praxis   Exercises:   Other Treatments:     Therapy/Group: Individual Therapy  [name] 09/29/2020, 6:54 AM

## 2020-09-29 NOTE — Consult Note (Signed)
Neuropsychological Consultation   Patient:   Zachary Flores   DOB:   21-Nov-1966  MR Number:  [id number]  Location:  [facility] [address] CENTER A [id number] [place] [address] Dept: [phone] Loc: [phone]           Date of Service:   09/29/2020  Start Time:   3 PM End Time:   4 PM  Provider/Observer:  Arley Phenix, Psy.D.       Clinical Neuropsychologist       Billing Code/Service: [phone]  Chief Complaint:    Zachary Flores is a 54 year old male with history of hypertension, tobacco and alcohol use.  Patient presented on 09/06/2020 with left hemiparesis, dysarthria and diplopia.  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  Urine drug screen was positive for cocaine.  Patient initially had episodes of agitation and restlessness.  Patient has had difficulties with dysphagia that had to be adjusted nutritional support.  Reason for Service:  Patient was referred for neuropsychological consultation due to coping and adjustment issues.  Below is the HPI for the current admission.  HPI: Zachary Flores is a 55 year old right-handed male with history of hypertension,  as well as alcohol/tobacco use.  History taken from chart review and patient due to cognition.  Patient lives with brother.  Works in a [employer].  He uses a straight cane.  He presented on 09/06/2020 with left hemiparesis, dysarthria and diplopia.  Blood pressure was noted to be 150/99.  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  Severe chronic small vessel ischemic disease.  Admission chemistries glucose 188, SARS coronavirus negative urine drug screen positive cocaine.  Neurology follow-up conservative care monitoring of blood pressure.  Latest echocardiogram with ejection fraction  of 55-60%, without emboli.  Patient did have episodes of agitation restlessness needing Precedex as well as CIWA protocol and currently maintained on Seroquel.  Hospital course further complicated by dysphagia, initially with nasogastric tube for nutritional support his diet has been advanced to a dysphagia #2 thin liquid.  Therapy evaluations completed and patient was admitted for a comprehensive rehab program.  Please see preadmission assessment from earlier today as well.  Current Status:  Upon entering the room, the patient was sitting slightly elevated in his bed.  The patient was alert and oriented but continued to show dysarthric speech.  The patient's receptive and expressive language appeared to be intact and his motor deficits were causing the speech impairments primarily.  The patient has clearly made significant improvements in his cognitive and mental status and he appeared to be oriented x3 today.  The patient was able to clearly demonstrate being able to learn new information and talk about some of the therapeutic activities he had engaged in today and yesterday.  The patient's mood was generally positive and appropriate to the situation overall.  He denied severe depression or anxiety.  The patient acknowledged that he had been "doing things that were not good for him."  The patient reports that he knew that he had had issues with blood pressure and there've been family history of stroke but was not realizing this risk factors that he had and what he was increasing with his behaviors.  The patient addressed the issues around his substance use that increased his  risk of having a hemorrhagic effect.  Behavioral Observation: Zachary Flores  presents as a 54 y.o.-year-old Right African American Male who appeared his stated age. his dress was Appropriate and he was Well Groomed and his manners were Appropriate to the situation.  his participation was indicative of Appropriate and Redirectable  behaviors.  There were any physical disabilities noted.  he displayed an appropriate level of cooperation and motivation.     Interactions:    Active Appropriate and Redirectable  Attention:   abnormal and attention span appeared shorter than expected for age  Memory:   abnormal; remote memory intact, recent memory impaired but improving  Visuo-spatial:  not examined  Speech (Volume):  normal  Speech:   slurred; dysarthric due to motor deficits  Thought Process:  Coherent and Relevant  Though Content:  WNL; not suicidal and not homicidal  Orientation:   person, place and time/date  Judgment:   Fair  Planning:   Poor  Affect:    Appropriate  Mood:    Dysphoric  Insight:   Good  Intelligence:   normal  Medical History:  History reviewed. No pertinent past medical history.  Psychiatric History:  No prior psychiatric history noted but the patient did acknowledge substance use.  Family Med/Psych History:  Family History  Family history unknown: Yes    Impression/DX:  Zachary Flores is a 54 year old male with history of hypertension, tobacco and alcohol use.  Patient presented on 09/06/2020 with left hemiparesis, dysarthria and diplopia.  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  Urine drug screen was positive for cocaine.  Patient initially had episodes of agitation and restlessness.  Patient has had difficulties with dysphagia that had to be adjusted nutritional support.  Upon entering the room, the patient was sitting slightly elevated in his bed.  The patient was alert and oriented but continued to show dysarthric speech.  The patient's receptive and expressive language appeared to be intact and his motor deficits were causing the speech impairments primarily.  The patient has clearly made significant improvements in his cognitive and mental status and he appeared to  be oriented x3 today.  The patient was able to clearly demonstrate being able to learn new information and talk about some of the therapeutic activities he had engaged in today and yesterday.  The patient's mood was generally positive and appropriate to the situation overall.  He denied severe depression or anxiety.  The patient acknowledged that he had been "doing things that were not good for him."  The patient reports that he knew that he had had issues with blood pressure and there've been family history of stroke but was not realizing this risk factors that he had and what he was increasing with his behaviors.  The patient addressed the issues around his substance use that increased his risk of having a hemorrhagic effect.  Disposition/Plan:  Today we worked on coping and adjustment issues following his pontine hemorrhagic event with residual motor deficits.  I will follow up with the patient next week.  Diagnosis:    Pontine hemorrhage (HCC) - Plan: Ambulatory referral to Neurology, CANCELED: Ambulatory referral to Physical Medicine Rehab  Right pontine cerebrovascular accident [facility])         Electronically Signed   _______________________ Arley Phenix, Psy.D.

## 2020-09-29 NOTE — Progress Notes (Signed)
Speech Language Pathology Weekly Progress and Session Note  Patient Details  Name: Zachary Flores MRN: [id number] Date of Birth: [birth year]  Beginning of progress report period: September 23, 2020 End of progress report period: October 01, 2019  Today's Date: 09/30/2020 SLP Individual Time: 1400-1456 SLP Individual Time Calculation (min): 56 min  Short Term Goals: Week 2: SLP Short Term Goal 1 (Week 2): Patient will tolerate trials of Dys 3 mechanical soft solids with min A for clearing oral residuals and mild anterior spillage of solids X2 prior to upgrade. SLP Short Term Goal 1 - Progress (Week 2): Met SLP Short Term Goal 2 (Week 2): Pt will demonstrate basic sequencing abilities during ADLs or other functional tasks with Min A verbal/visual cues. SLP Short Term Goal 2 - Progress (Week 2): Met SLP Short Term Goal 3 (Week 2): Pt will demonstrate emergent awareness by detecting functional errors with Mod A multimodal cues. SLP Short Term Goal 3 - Progress (Week 2): Met SLP Short Term Goal 4 (Week 2): Pt will recall 2 safety precautions with Min A verbal/visual cues for aids and/or strategies. SLP Short Term Goal 4 - Progress (Week 2): Met SLP Short Term Goal 5 (Week 2): Pt will sustain attention to functional tasks with Min A verbla/visual cues for redirection. SLP Short Term Goal 5 - Progress (Week 2): Met SLP Short Term Goal 6 (Week 2): Pt will demonstrate ability to problem solve basic familiar situations with Mod A verbal/visual cues. SLP Short Term Goal 6 - Progress (Week 2): Met    New Short Term Goals: Week 3: SLP Short Term Goal 1 (Week 3): STG=LTG due to estimated remaining length of stay  Weekly Progress Updates: Pt has made functional gains and met 6 out of 6 short term goals this reporting period. Pt is currently ~Mod assist for basic familiar tasks due to cognitive impairments mostly impacting his basic problem solving, sequencing, attention, emergent and general safety  awareness. He has mild dysarthria requiring Min A verbal cues for implementation of compensatory strategies to increase his speech intelligibility in functional situations (~80-85% intelligible in conversation). Pt was upgraded to a dysphagia 3 texture (mechanical soft) diet with thin liquids this week. He is becoming far more independent with use of safe swallow strategies and pocketing and anterior loss have nearly diminished; currently he is undergoing trials of regular solids with SLP. Pt has demonstrated improved sustained attention, recall, sequencing, speech intelligibility, and oral phase swallow function. Pt and family education is ongoing. Pt would continue to benefit from skilled ST while inpatient in order to maximize functional independence and reduce burden of care prior to discharge. Anticipate that pt will need 24/7 supervision at discharge in addition to [place] follow up at next level of care. Of note, pt does have limited caregiver support which may present challenges to his discharge plans. If pt's family can not arrange 24/7 supervision and hands on care at discharge, it is this SLP's recommendation the next venue of care be SNF for his greatest safety.      Intensity: Minumum of 1-2 x/day, 30 to 90 minutes Frequency: 3 to 5 out of 7 days Duration/Length of Stay: 10/07/20 Treatment/Interventions: Cognitive remediation/compensation;Internal/external aids;Functional tasks;Patient/family education;Speech/Language facilitation;Therapeutic Activities;Dysphagia/aspiration precaution training;Cueing hierarchy   Daily Session  Skilled Therapeutic Interventions: Pt was seen for skilled ST targeting dysphagia and cognitive skills. SLP facilitated session with an upgraded regular texture solid trial of graham crackers and peanut butter. Pt demonstrated increased difficulty clearing min right buccal pocketing of  this regular texture snack in comparison to yesterday (when he tried chips). Overall Min  A verbal cues provided for use of lingual sweep and liquid washes for oral clearance. No overt s/sx aspiration noted across intake. Recommend pt continue current diet and trials of regular with SLP prior to advancement, preferably with trial meal tray. SLP further facilitated session with a trail making task, which pt only required Supervision A verbal cues for sequencing and problem solving throughout. Other picture based sequencing tasks ranging in complexity from semi-complex to complex also targeted. Pt only required Min A verbal and visual cues for problem solving and error awareness during 4-step action picture card sequencing, but increased Mod A required for same skills when complexity increased to 6-steps. Pt required 2 verbal cues for redirection to tasks during session but otherwise attended appropriately in a quiet environment. Pt left laying in bed with alarm set and needs within reach. Continue per current plan of care.            Pain Pain Assessment Pain Scale: 0-10 Pain Score: 0-No pain  Therapy/Group: Individual Therapy  Arbutus Leas 09/30/2020, 2:59 PM

## 2020-09-29 NOTE — Progress Notes (Signed)
Physical Therapy Session Note  Patient Details  Name: Zachary Flores MRN: [id number] Date of Birth: Aug 20, 1966  Today's Date: 09/29/2020 PT Individual Time: 1000-1054 PT Individual Time Calculation (min): 54 min   Short Term Goals: Week 2:  PT Short Term Goal 1 (Week 2): Pt will perform bed mobility with minA PT Short Term Goal 2 (Week 2): Pt will maintain unsupported sitting balance with no more than minA PT Short Term Goal 3 (Week 2): Pt will perform bed<>chair transfers with minA and LRAD PT Short Term Goal 4 (Week 2): Pt will ambulate 77ft with modA and LRAD  Skilled Therapeutic Interventions/Progress Updates:    Pt received sitting in recliner, agreeable to PT session, denies any pain. WC transport for time management and energy conservation from his room to main therapy gym. Performed stand<>pivot with modA from w/c to mat table with cues for safety, pacing, and awaiting therapy instructions prior to initiation as he continues to demonstrate moderate impulsivity with functional tasks. He was able to maintain unsupported (BUE) sitting EOM for >30 minutes with CGA. Performed neuro re-ed sitting balance with combination of various BLE coordination and BUE coordination tasks. This included rolling a 2# med ball up/down large wedge with RUE and the same with LUE although requiring hand-over-hand assist, performed to assist with proprioceptive feedback as well as coordination training. He then performed 2x10 chest press and 2x10 shldr press with 2# med ball with hand-over-hand assist for LUE grip/coordination. Then performed seated toe taps on rubber cones with unilateral training, 2x10 RLE + 2x10 LLE. Increased difficulty with coordinating LLE and there was several instances where he would kick the cone over. Then performed the standing toe taps on rubber cones with unilateral training, 2x10 RLE + 2x10 LLE, requiring modA +2 for standing balance during completion and noted significantly more  difficulty with coordination in standing > sitting. He's also demonstrating more of a forward trunk lean in standing than he has in the past. Stand<>pivot transfer with modA from mat table back to w/c and returned to his room. Pt requesting to return to bed, therefore stand<>pivot with modA with cues for safety and technique, sit>supine with minA for BLE management with HOB flat. Pt reporting need to void, NT present who was able to assist. Pt ended session semi-reclined in bed with bed rails up, bed alarm on, needs in reach, NT present assisting with urinal/toileting.   Therapy Documentation Precautions:  Precautions Precautions: Fall Precaution Comments: left sided weakness; monitor BP Restrictions Weight Bearing Restrictions: No  Therapy/Group: Individual Therapy  Zachary Flores P Jaedyn Lard PT 09/29/2020, 10:55 AM

## 2020-09-29 NOTE — Progress Notes (Addendum)
[place] PHYSICAL MEDICINE & REHABILITATION PROGRESS NOTE   Subjective/Complaints: No complaints this morning. Denies pain, constipation, insomnia.  Notes no more orthostasis with therapy. Continues to have ataxia with therapy  ROS: Limited due to cognition, but appears to deny CP, shortness of breath, nausea, vomiting, diarrhea.  Objective:   No results found. Recent Labs    09/27/20 0610  WBC 6.0  HGB 14.9  HCT 46.0  PLT 623*   No results for input(s): NA, K, CL, CO2, GLUCOSE, BUN, CREATININE, CALCIUM in the last 72 hours.  Intake/Output Summary (Last 24 hours) at 09/29/2020 0958 Last data filed at 09/29/2020 0025 Gross per 24 hour  Intake 460 ml  Output 500 ml  Net -40 ml     Pressure Injury 09/18/20 Buttocks Left;Upper Stage 2 -  Partial thickness loss of dermis presenting as a shallow open injury with a red, pink wound bed without slough. open blister (Active)  09/18/20 2200  Location: Buttocks  Location Orientation: Left;Upper  Staging: Stage 2 -  Partial thickness loss of dermis presenting as a shallow open injury with a red, pink wound bed without slough.  Wound Description (Comments): open blister  Present on Admission: No     Pressure Injury 09/18/20 Buttocks Left;Lower Stage 2 -  Partial thickness loss of dermis presenting as a shallow open injury with a red, pink wound bed without slough. open blister (Active)  09/18/20 2201  Location: Buttocks  Location Orientation: Left;Lower  Staging: Stage 2 -  Partial thickness loss of dermis presenting as a shallow open injury with a red, pink wound bed without slough.  Wound Description (Comments): open blister  Present on Admission: No    Physical Exam: Vital Signs Blood pressure 121/87, pulse 68, temperature 98.1 F (36.7 C), resp. rate 19, height 5\' 7"  (1.702 m), weight 74.7 kg, SpO2 98 %.   General: Alert and oriented x 3, No apparent distress HEENT: Head is normocephalic, atraumatic, PERRLA, EOMI, sclera  anicteric, oral mucosa pink and moist, dentition intact, ext ear canals clear,  Neck: Supple without JVD or lymphadenopathy Heart: Reg rate and rhythm. No murmurs rubs or gallops Chest: CTA bilaterally without wheezes, rales, or rhonchi; no distress Abdomen: Soft, non-tender, non-distended, bowel sounds positive. Extremities: No clubbing, cyanosis, or edema. Pulses are 2+ Skin: Warm and dry.  Buttock wounds not examined today Psych: Normal mood.  Normal behavior. Musc: No edema in extremities.  No tenderness in extremities. Neuro: Alert Dysarthria, unchanged Follows simple commands.   Limited awareness of deficits  Left neglect, unchanged Left sided apraxia, unchanged Motor: RUE: 4+/5 proximal distal, unchanged RLE: 4-4+/5 proximal distal LUE: 4+/5 proximal distal LLE: Hip flexion, knee extension 4/5, ankle dorsiflexion 3/5  Left upper extremity dysmetria and apraxia, unchanged     Assessment/Plan: 1. 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  Physiatrist is providing close team supervision and 24 hour management of active medical problems listed below.  Physiatrist and rehab team continue to assess barriers to discharge/monitor patient progress toward functional and medical goals    Medical Problem List and Plan: 1.  Left side hemiparesis and slurred speech secondary to central pontine hemorrhage in the setting of hypertension and cocaine use  -Interdisciplinary Team Conference today  2.  Antithrombotics: -DVT/anticoagulation: SCDs             -antiplatelet therapy: N/A 3. Pain Management: Tylenol as needed.   Controlled 10/6 4. Mood:  Advised emotional support             -antipsychotic agents: Seroquel 25 mg nightly DC'd 5. Neuropsych: This patient is not fully capable of making decisions on his own behalf. 6. Skin/Wound Care: foam dressing, continue pressure  relief 7. Fluids/Electrolytes/Nutrition: Routine in and outs.    BMP within normal limits on 10/1 8.  Post stroke dysphagia.      Advanced to D3 thins  Overall good p.o. intake  Continue to advance diet as tolerated 9.  History of hypertension, now with hypotension.    Lopressor 25 mg twice daily, decreased to 12.5 twice daily on 9/28, DC'd on 9/30  Lisinopril DC'd Vitals:   09/29/20 0512 09/29/20 0830  BP: 121/87   Pulse: (!) 52 68  Resp:    Temp: 98.1 F (36.7 C)   SpO2: 98%    Well controlled 10/6 10.  Polysubstance abuse-alcohol, tobacco, cocaine abuse.  Monitor for any signs of withdrawal.    Counsel 11. Urinary retention:   Improved 12.  Thrombocytosis  Platelets 623 on 10/4, plan order labs at the end of this week   LOS: 14 days A FACE TO FACE EVALUATION WAS PERFORMED  Drema Pry Ishmel Acevedo 09/29/2020, 9:58 AM

## 2020-09-30 ENCOUNTER — Inpatient Hospital Stay (HOSPITAL_COMMUNITY): Payer: Self-pay

## 2020-09-30 ENCOUNTER — Inpatient Hospital Stay (HOSPITAL_COMMUNITY): Payer: Self-pay | Admitting: Speech Pathology

## 2020-09-30 ENCOUNTER — Inpatient Hospital Stay (HOSPITAL_COMMUNITY): Payer: Self-pay | Admitting: Occupational Therapy

## 2020-09-30 DIAGNOSIS — R0989 Other specified symptoms and signs involving the circulatory and respiratory systems: Secondary | ICD-10-CM

## 2020-09-30 DIAGNOSIS — I69354 Hemiplegia and hemiparesis following cerebral infarction affecting left non-dominant side: Secondary | ICD-10-CM

## 2020-09-30 NOTE — Progress Notes (Signed)
Occupational Therapy Session Note  Patient Details  Name: Zachary Flores MRN: [id number] Date of Birth: 1966-09-27  Today's Date: 09/30/2020 OT Individual Time: 1000-1100 OT Individual Time Calculation (min): 60 min    Short Term Goals: Week 2:  OT Short Term Goal 1 (Week 2): Pt will complete SPT toilet transfer with min assist OT Short Term Goal 2 (Week 2): Pt will complete LB dressing with mod assist OT Short Term Goal 3 (Week 2): Pt will complete LB bathing with mod assist OT Short Term Goal 4 (Week 2): Pt will complete UB dressing with supervision.   Skilled Therapeutic Interventions/Progress Updates:    Pt sitting up in w/c, requesting to use bathroom.  No c/o pain.  Pt completed SPT w/c to toilet using grab bars with mod assist with premature attempt to sit. Educated pt on safe technique for future transfers.  Pt had continent episode of bowel,small needing CGA while sitting on toilet due to ataxia.  Toileting completed with max assist.  SPT toilet to w/c with min assist.  Pt washed hands sitting sinkside with setup.  Pt transported to ortho gym to complete x 10 minutes BUE mobility using UBE on level 2.5 with left hand secured using ace wrap.  Pts left calf pad on leg rest loose and allowing LLE to fall to floor therefore secured calf pad for improved support.  Pt transported to room and requesting back to bed.  Pt completed SPT with min assist to EOB.  Sit to supine with CGA.  Soft call bell in reach, bed alarm on.  Therapy Documentation Precautions:  Precautions Precautions: Fall Precaution Comments: left sided weakness; monitor BP Restrictions Weight Bearing Restrictions: No   Therapy/Group: Individual Therapy  Amie Critchley 09/30/2020, 10:42 AM

## 2020-09-30 NOTE — Plan of Care (Signed)
  Problem: Consults °Goal: RH GENERAL PATIENT EDUCATION °Description: Patient will gain knowledge of disease management, pain management bowel, and bladder management during this admission. °Outcome: Progressing °Goal: Skin Care Protocol Initiated - if Braden Score 18 or less °Description: If consults are not indicated, leave blank or document N/A °Outcome: Progressing °Goal: Nutrition Consult-if indicated °Outcome: Progressing °  °Problem: RH BOWEL ELIMINATION °Goal: RH STG MANAGE BOWEL WITH ASSISTANCE °Description: STG Manage Bowel with Assistance. °Outcome: Progressing °  °Problem: RH BLADDER ELIMINATION °Goal: RH STG MANAGE BLADDER WITH ASSISTANCE °Description: STG Manage Bladder With Assistance °Patient will manage bladder with mod assistance. °Outcome: Progressing °Goal: RH STG MANAGE BLADDER WITH EQUIPMENT WITH ASSISTANCE °Description: STG Manage Bladder With Equipment With Assistance °Outcome: Progressing °  °Problem: RH SKIN INTEGRITY °Goal: RH STG SKIN FREE OF INFECTION/BREAKDOWN °Description: Patients skin will be free of infection and break down during this admission. °Outcome: Progressing °  °Problem: RH SAFETY °Goal: RH STG ADHERE TO SAFETY PRECAUTIONS W/ASSISTANCE/DEVICE °Description: STG Adhere to Safety Precautions With Assistance/Device. °Patient will adhere to safety plan and will not have any fall this admission. °Outcome: Progressing °  °Problem: RH PAIN MANAGEMENT °Goal: RH STG PAIN MANAGED AT OR BELOW PT'S PAIN GOAL °Description: Patients pain will be managed at or below the level of 3 on a 0-10 pain scale during this admission. °Outcome: Progressing °  °Problem: RH KNOWLEDGE DEFICIT GENERAL °Goal: RH STG INCREASE KNOWLEDGE OF SELF CARE AFTER HOSPITALIZATION °Description: Patient will gain knowledge of self care during this admission. °Outcome: Progressing °  °

## 2020-09-30 NOTE — Progress Notes (Signed)
[facility] PHYSICAL MEDICINE & REHABILITATION PROGRESS NOTE   Subjective/Complaints: Patient seen standing up with therapy this morning.  He states he slept well overnight.  He denies complaints.  ROS: Limited due to cognition, but appears to deny CP, shortness of breath, nausea, vomiting, diarrhea.  Objective:   No results found. No results for input(s): WBC, HGB, HCT, PLT in the last 72 hours. No results for input(s): NA, K, CL, CO2, GLUCOSE, BUN, CREATININE, CALCIUM in the last 72 hours.  Intake/Output Summary (Last 24 hours) at 09/30/2020 1653 Last data filed at 09/30/2020 1301 Gross per 24 hour  Intake 460 ml  Output 425 ml  Net 35 ml     Pressure Injury 09/18/20 Buttocks Left;Upper Stage 2 -  Partial thickness loss of dermis presenting as a shallow open injury with a red, pink wound bed without slough. open blister (Active)  09/18/20 2200  Location: Buttocks  Location Orientation: Left;Upper  Staging: Stage 2 -  Partial thickness loss of dermis presenting as a shallow open injury with a red, pink wound bed without slough.  Wound Description (Comments): open blister  Present on Admission: No     Pressure Injury 09/18/20 Buttocks Left;Lower Stage 2 -  Partial thickness loss of dermis presenting as a shallow open injury with a red, pink wound bed without slough. open blister (Active)  09/18/20 2201  Location: Buttocks  Location Orientation: Left;Lower  Staging: Stage 2 -  Partial thickness loss of dermis presenting as a shallow open injury with a red, pink wound bed without slough.  Wound Description (Comments): open blister  Present on Admission: No    Physical Exam: Vital Signs Blood pressure 125/85, pulse 65, temperature (!) 97.4 F (36.3 C), resp. rate 18, height 5\' 7"  (1.702 m), weight 74.7 kg, SpO2 97 %.  Constitutional: No distress . Vital signs reviewed. HENT: Normocephalic.  Atraumatic. Eyes: EOMI. No discharge. Cardiovascular: No JVD.  RRR. Respiratory:  Normal effort.  No stridor.  Bilateral clear to auscultation. GI: Non-distended.  BS +. Skin: Warm and dry.  Buttock wounds not examined today. Psych: Normal mood.  Normal behavior. Musc: No edema in extremities.  No tenderness in extremities. Neuro: Alert Dysarthria, stable Follows simple commands.   Limited awareness of deficits  Left neglect, unchanged Left sided apraxia, unchanged Motor: RUE: 4+/5 proximal distal, unchanged RLE: 4-4+/5 proximal distal LUE: 4+/5 proximal distal LLE: Hip flexion, knee extension 4/5, ankle dorsiflexion 3/5  Left upper extremity dysmetria and apraxia, unchanged     Assessment/Plan: 1. 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  Physiatrist is providing close team supervision and 24 hour management of active medical problems listed below.  Physiatrist and rehab team continue to assess barriers to discharge/monitor patient progress toward functional and medical goals    Medical Problem List and Plan: 1.  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  Antithrombotics: -DVT/anticoagulation: SCDs             -antiplatelet therapy: N/A 3. Pain Management: Tylenol as needed.   Controlled 10/7 4. Mood: Advised emotional support             -antipsychotic agents: Seroquel 25 mg nightly DC'd 5. Neuropsych: This patient is not fully capable of making decisions on his own behalf. 6. Skin/Wound Care: foam dressing, continue pressure relief 7. Fluids/Electrolytes/Nutrition: Routine in  and outs.    BMP within normal limits on 10/1 8.  Post stroke dysphagia.      Advanced to D3 thins  Overall good p.o. intake  Continue to advance diet as tolerated 9.  History of hypertension, now with hypotension.    Lopressor 25 mg twice daily, decreased to  12.5 twice daily on 9/28, DC'd on 9/30  Lisinopril DC'd Vitals:   09/30/20 0332 09/30/20 1303  BP: (!) 145/88 125/85  Pulse: (!) 54 65  Resp: 17 18  Temp: 98.4 F (36.9 C) (!) 97.4 F (36.3 C)  SpO2: 99% 97%   Slightly labile, relatively controlled on 10/7 10.  Polysubstance abuse-alcohol, tobacco, cocaine abuse.  Monitor for any signs of withdrawal.    Counsel 11. Urinary retention:   Improved 12.  Thrombocytosis  Platelets 623 on 10/4, labs ordered for tomorrow   LOS: 15 days A FACE TO FACE EVALUATION WAS PERFORMED  Ayline Dingus [name] 09/30/2020, 4:53 PM

## 2020-09-30 NOTE — Progress Notes (Signed)
Physical Therapy Session Note  Patient Details  Name: Zachary Flores MRN: [id number] Date of Birth: 05/27/1966  Today's Date: 09/30/2020 PT Individual Time: 0800-0854 PT Individual Time Calculation (min): 54 min   Short Term Goals: Week 2:  PT Short Term Goal 1 (Week 2): Pt will perform bed mobility with minA PT Short Term Goal 2 (Week 2): Pt will maintain unsupported sitting balance with no more than minA PT Short Term Goal 3 (Week 2): Pt will perform bed<>chair transfers with minA and LRAD PT Short Term Goal 4 (Week 2): Pt will ambulate 50ft with modA and LRAD  Skilled Therapeutic Interventions/Progress Updates:     Pt received supine in bed, awake and agreeable to PT session; denies any pain. Donned TED's and shoes with totalA for time management. He was able to remove his hospital socks with minA while supine. Required maxA for donning pants via bridging technique. Supine<>sit with minA with HOB flat, using bed rail. Able to maintain sitting EOB with CGA and donned shirt with minA. Performed x6 stand<>pivot transfers with modA from bed<>w/c. Blocked practice to assist with carryover with safety awareness, sequencing, and technique. Pt requesting to brush his teeth, wheeled sinkside where he brushed teeth using RUE with setupA. WC transport for time management to dayroom gym to perform gait training on LiteGait over treadmill. Required +2 for setup due to impulsivity and poor safety awareness. Harness donned in standing. He ambulated a total of 19ft on treadmill (0.38mph) with LiteGait, modA +2 for advancing BLE and B foot placement. Ace wrapped LUE to handgrip to limit distractions and assist with controlling LUE ataxia. VC throughout gait for foot placement, pacing, sequencing, increasing R step height/length, and increasing L knee extension in stance. Returned pt back to his room in w/c for time management, he remained seated in TIS w/c with belt alarm on, needs in reach   Therapy  Documentation Precautions:  Precautions Precautions: Fall Precaution Comments: left sided weakness; monitor BP Restrictions Weight Bearing Restrictions: No  Therapy/Group: Individual Therapy   Zachary Flores PT 09/30/2020, 8:56 AM

## 2020-09-30 NOTE — Progress Notes (Signed)
Occupational Therapy Session Note  Patient Details  Name: Zachary Flores MRN: [id number] Date of Birth: [birth year]  Today's Date: 09/30/2020 OT Individual Time: 1535-1600 OT Individual Time Calculation (min): 25 min    Short Term Goals: Week 1:  OT Short Term Goal 1 (Week 1): Pt will complete UB dressing with mod assist OT Short Term Goal 1 - Progress (Week 1): Met OT Short Term Goal 2 (Week 1): Pt will complete UB bathing with min assist OT Short Term Goal 2 - Progress (Week 1): Met OT Short Term Goal 3 (Week 1): Pt will complete self feeding with min assist OT Short Term Goal 3 - Progress (Week 1): Progressing toward goal OT Short Term Goal 4 (Week 1): Pt will be able to stand with mod assist in preperation for LB ADLs OT Short Term Goal 4 - Progress (Week 1): Met  Skilled Therapeutic Interventions/Progress Updates:    1:1. Pt received in bed after bed change with NT in room. Pt agreeable to application of lotion and dressing this session. Pt req A to doff teds d/t tightness. NMR/coordination of forced use of LUE to reach for lotion bottle, open with RUE holding bottle in LUE and squeezing lotion onto extremities. OT provides deep HOH A input of LUE rubbing lotion into extremities for NMR of LUE in long sitting figure 4. Pt dons socks and pants with MAX HOH A for bimanual task reaching over feet. P tbridges hips with A to stabilize feet and advance pants past hips. Exited session with pt seated in bed, exit alarm on and call ligth in reach  Therapy Documentation Precautions:  Precautions Precautions: Fall Precaution Comments: left sided weakness; monitor BP Restrictions Weight Bearing Restrictions: No General:   Vital Signs: Therapy Vitals Temp: (!) 97.4 F (36.3 C) Pulse Rate: 65 Resp: 18 BP: 125/85 Patient Position (if appropriate): Lying Oxygen Therapy SpO2: 97 % O2 Device: Room Air Pain: Pain Assessment Pain Scale: 0-10 Pain Score: 0-No pain ADL: ADL Eating:  Moderate assistance Where Assessed-Eating: Wheelchair Grooming: Moderate assistance Where Assessed-Grooming: Sitting at sink Upper Body Bathing: Maximal assistance, Maximal cueing Where Assessed-Upper Body Bathing: Sitting at sink Upper Body Dressing: Maximal assistance, Maximal cueing Where Assessed-Upper Body Dressing: Sitting at sink Vision   Perception    Praxis   Exercises:   Other Treatments:     Therapy/Group: Individual Therapy  [name] 09/30/2020, 4:16 PM

## 2020-10-01 ENCOUNTER — Inpatient Hospital Stay (HOSPITAL_COMMUNITY): Payer: Self-pay

## 2020-10-01 ENCOUNTER — Inpatient Hospital Stay (HOSPITAL_COMMUNITY): Payer: Self-pay | Admitting: Occupational Therapy

## 2020-10-01 DIAGNOSIS — R030 Elevated blood-pressure reading, without diagnosis of hypertension: Secondary | ICD-10-CM

## 2020-10-01 LAB — CBC WITH DIFFERENTIAL/PLATELET
Abs Immature Granulocytes: 0.01 10*3/uL (ref 0.00–0.07)
Basophils Absolute: 0.1 10*3/uL (ref 0.0–0.1)
Basophils Relative: 1 %
Eosinophils Absolute: 0.2 10*3/uL (ref 0.0–0.5)
Eosinophils Relative: 3 %
HCT: 41.8 % (ref 39.0–52.0)
Hemoglobin: 13.6 g/dL (ref 13.0–17.0)
Immature Granulocytes: 0 %
Lymphocytes Relative: 36 %
Lymphs Abs: 2 10*3/uL (ref 0.7–4.0)
MCH: 29.3 pg (ref 26.0–34.0)
MCHC: 32.5 g/dL (ref 30.0–36.0)
MCV: 90.1 fL (ref 80.0–100.0)
Monocytes Absolute: 0.7 10*3/uL (ref 0.1–1.0)
Monocytes Relative: 13 %
Neutro Abs: 2.6 10*3/uL (ref 1.7–7.7)
Neutrophils Relative %: 47 %
Platelets: 583 10*3/uL — ABNORMAL HIGH (ref 150–400)
RBC: 4.64 MIL/uL (ref 4.22–5.81)
RDW: 13 % (ref 11.5–15.5)
WBC: 5.5 10*3/uL (ref 4.0–10.5)
nRBC: 0 % (ref 0.0–0.2)

## 2020-10-01 NOTE — Progress Notes (Signed)
Occupational Therapy Weekly Progress Note  Patient Details  Name: Zachary Flores MRN: [id number] Date of Birth: 09/18/1966  Beginning of progress report period: September 24, 2020 End of progress report period: October 01, 2020  Today's Date: 10/01/2020 OT Individual Time: 1100-1200 OT Individual Time Calculation (min): 60 min    Patient has met 1 of 4 short term goals.  Pt is making slow progress towards goals, with primary barriers being significant ataxia of LUE and LLE and impaired cognition including safety awareness and attention, and double vision with impaired visual motor skills.  Pt has improved with functional transfers from max/modA to mod/minA, however still struggles with consistent and safe technique.  Pt has also improved independence during bathing with use of long handled sponge.  Pt's performance during dressing can fluctuate depending on attention level.  Pt is motivated to participate consistently and pleasant to work with.  Anticipate pt will need 24 hour supervision and physical assist upon d/c.   Patient continues to demonstrate the following deficits: muscle weakness, decreased cardiorespiratoy endurance, impaired timing and sequencing, ataxia, decreased coordination and decreased motor planning, decreased visual acuity, decreased visual perceptual skills and decreased visual motor skills, decreased midline orientation, decreased attention to left and decreased motor planning, decreased attention, decreased awareness, decreased problem solving, decreased safety awareness and decreased memory and decreased sitting balance, decreased standing balance, decreased postural control and decreased balance strategies and therefore will continue to benefit from skilled OT intervention to enhance overall performance with BADL.  Patient not progressing toward long term goals.  See goal revision..  Plan of care revisions: See below:.   Problem: RH Balance Goal: LTG: Patient will maintain  dynamic sitting balance (OT) Description: LTG:  Patient will maintain dynamic sitting balance with assistance during activities of daily living (OT) Flowsheets (Taken 10/01/2020 1248) LTG: Pt will maintain dynamic sitting balance during ADLs with: (downgrade due to pt making slow progress) Contact Guard/Touching assist   Problem: RH Eating Goal: LTG Patient will perform eating w/assist, cues/equip (OT) Description: LTG: Patient will perform eating with assist, with/without cues using equipment (OT) Flowsheets (Taken 10/01/2020 1250) LTG: Pt will perform eating with assistance level of: Minimal Assistance - Patient > 75%   Problem: RH Dressing Goal: LTG Patient will perform lower body dressing w/assist (OT) Description: LTG: Patient will perform lower body dressing with assist, with/without cues in positioning using equipment (OT) Flowsheets (Taken 10/01/2020 1250) LTG: Pt will perform lower body dressing with assistance level of: (downgrade due to pt making slow progress) Moderate Assistance - Patient 50 - 74% Note: downgrade due to pt making slow progress   Problem: RH Toileting Goal: LTG Patient will perform toileting task (3/3 steps) with assistance level (OT) Description: LTG: Patient will perform toileting task (3/3 steps) with assistance level (OT)  Flowsheets (Taken 10/01/2020 1250) LTG: Pt will perform toileting task (3/3 steps) with assistance level: Moderate Assistance - Patient 50 - 74% Note: downgrade due to pt making slow progress   Problem: RH Functional Use of Upper Extremity Goal: LTG Patient will use RT/LT upper extremity as a (OT) Description: LTG: Patient will use right/left upper extremity as a stabilizer/gross assist/diminished/nondominant/dominant level with assist, with/without cues during functional activity (OT) Flowsheets (Taken 10/01/2020 1250) LTG: Use of upper extremity in functional activities: . LUE as diminished level . RUE as dominant level LTG: Pt will use  upper extremity in functional activity with assistance level of: Minimal Assistance - Patient > 75%   OT Short Term Goals Week 2:  OT Short Term Goal 1 (Week 2): Pt will complete SPT toilet transfer with min assist OT Short Term Goal 1 - Progress (Week 2): Partly met OT Short Term Goal 2 (Week 2): Pt will complete LB dressing with mod assist OT Short Term Goal 2 - Progress (Week 2): Progressing toward goal OT Short Term Goal 3 (Week 2): Pt will complete LB bathing with mod assist OT Short Term Goal 3 - Progress (Week 2): Met OT Short Term Goal 4 (Week 2): Pt will complete UB dressing with supervision. OT Short Term Goal 4 - Progress (Week 2): Partly met Week 3:  OT Short Term Goal 1 (Week 3): STGs= LTGs due to ELOS     Skilled Therapeutic Interventions/Progress Updates:    Pt sitting up in TIS w/c, no c/o pain, requesting to toilet, but declining bathing/dressing during this session, wants to work on his arm in gym today.  Pt requesting to toilet prior to going to gym.  Min assist SPT w/c <> 3 in 1 commode using grab bars.  Max assist for toileting.  Pt transported to gym via w/c for neuro re-ed of LUE.  Pt completed resistive push/pull and tap a target on surface of mat in seat position using 4 lb wrist weight in various directions.  Pt needed intermittent HOH to facilitate gross motor control.  Pt completed resistive reach and tap task using medium resistance band to provide proprioceptive input throughout the reach.  Pt falling asleep intermittently needing VCs to arouse and participate.  Pt returned to room via w/c transport and requesting back to bed.  SPT w/c to EOB with min assist and sit to supine with CGA.  Soft call bell in reach, bed alarm on.  Therapy Documentation Precautions:  Precautions Precautions: Fall Precaution Comments: left sided weakness; monitor BP Restrictions Weight Bearing Restrictions: No      Therapy/Group: Individual Therapy  Ezekiel Slocumb 10/01/2020,  12:46 PM

## 2020-10-01 NOTE — Progress Notes (Signed)
Physical Therapy Weekly Progress Note  Patient Details  Name: Zachary Flores MRN: [id number] Date of Birth: Dec 19, 1966  Beginning of progress report period: September 24, 2020 End of progress report period: October 01, 2020  Today's Date: 10/01/2020 PT Individual Time: 1962-2297 + 1415-1510 PT Individual Time Calculation (min): 58 min + 55 min  Patient has met 2 of 4 short term goals. Pt is making slow progress towards LTG during his rehab stay. He has showed most improvement in bed mobility, sitting/standing balance, and transfer training. He still requires minA for bed mobility, modA for transfers, modA for standing balance, and has ambulated variable distances with mod/maxA +2 up to ~106f. He continues to be primarily limited by poor safety awareness, moderate impulsivity, decreased insight into deficits, significant motor apraxia.  Patient continues to demonstrate the following deficits muscle weakness, impaired timing and sequencing, unbalanced muscle activation, motor apraxia, ataxia, decreased coordination and decreased motor planning, decreased attention to left, decreased motor planning and ideational apraxia and decreased attention, decreased awareness, decreased problem solving and decreased safety awareness and therefore will continue to benefit from skilled PT intervention to increase functional independence with mobility.  Patient progressing toward long term goals..  Plan of care revisions: Ambulation goals set to modA, w/c mobility to minA, and standing balance to minA. See POC note for details.  PT Short Term Goals Week 2:  PT Short Term Goal 1 (Week 2): Pt will perform bed mobility with minA PT Short Term Goal 2 (Week 2): Pt will maintain unsupported sitting balance with no more than minA PT Short Term Goal 3 (Week 2): Pt will perform bed<>chair transfers with minA and LRAD PT Short Term Goal 4 (Week 2): Pt will ambulate 711fwith modA and LRAD Week 3:  PT Short Term Goal 1 (Week  3): STG = LTG due to ELOS  Skilled Therapeutic Interventions/Progress Updates:     1st session: Pt received supine in bed, agreeable to PT session; denies any pain. He was able to remove hospital socks with supervision while laying in bed. Required mod/maxA for donning scrub pants via bridging technique, requiring assist for stabilizing LLE. Required totalA for donning B TED's and maxA for donned shoes. Supine<>sit with min/modA with HOB flat, using bed rail. Able to sit EOB with CGA, impulsivity noted during sitting while he was trying to reach with RUE towards night stand to grab objects, requiring modA for stabilizing trunk. Donned T-shirt with modA while seated EOB and stand<>pivot with modA from EOB to w/c. Pt requesting to brush his teeth and wash his face, wheeled sinkside where he brushed teeth with setupA, using RUE for oral care. After completion, he then reported need for BM. Wheeled inside toilet and performed stand<>pivot transfer from w/c to [facility], cues for safety, pacing, and sequencing. He also requires frequent reminders for L foot placement during transfers as he often leaves it too far flexed under his bottom rather than keeping it flat on floor. Pt was continent of bowel, documented. NT +2 called for safety to assist with pericare and transfer. Pt performed sit<>stand with minA from 3-1 and required modA guard for standing balance while he used RUE for posterior pericare. Stand<>pivot with modA +2 back to his w/c and wheeled sinkside for hand hygiene. WC transport from his room to dayroom gym, performed stand<>pivot transfer with modA from w/c to Nustep. Performed x6 minutes of Nustep at workload of 5 with BLE's only, requiring continuous therapist assist for L knee alignment and  for maintaining foot in saddle. Completed Nustep for purposes of BLE coordination training. Stand<>pivot with modA back to w/c, requiring minA for repositioning and posterior scoot. WC transport back to  his room and remained seated in w/c with belt alarm on, needs in reach.  2nd session: Pt received supine in bed, sleeping on arrival but awakens easily to voice, agreeable to PT session; reports no pain. Focus of session gait training with LiteGait overground rather than treadmill with idea to limit distractions and perform with some form of "normality." Donned B shoes with totalA while supine in bed, supine<>sit with minA with HOB flat, using bed rail. Stand<>pivot transfer with modA from EOB to w/c, cues for safety awareness, pacing, sequencing, and waiting therapist instructions for impulsivity. WC transport with [place] for time management from his room to dayroom gym. Setup LiteGait harness while standing with min/modA guard and ace-wrapped LUE to LiteGait hand rail to assist with maintaining grip during gait. He ambulated ~14f overground with LiteGait and +2 assist for steering LiteGait and therapist facilitating lateral weight shift and L foot advancement/placement. He would often "sit" in harness during gait and requires frequent cues for upright posture and B foot awareness. Significant motor apraxia in BLE's impacting gait. In middle of gait, pt with episode of urinary incontinence, therefore, further gait terminated. Able to remove harness in standing with modA, and WC transport back to his room. Stand<>pivot with modA from w/c to EOB, sit>supine with minA. Required maxA for removing scrub pants, totalA for removing brief. Providing him a clean/soaped washcloth which he was able to use RUE to perform pericare. Donned clean brief afterwards. Pt then reporting fatigue and requests to remain supine in bed to rest. Pt missed 20 minutes of skilled PT this session due to fatigue. He ended session supine in bed, bed rails up and bed alarm on, needs in reach.  Therapy Documentation Precautions:  Precautions Precautions: Fall Precaution Comments: left sided weakness; monitor BP Restrictions Weight  Bearing Restrictions: No  Therapy/Group: Individual Therapy  Janeane Cozart P Gerome Kokesh PT 10/01/2020, 7:37 AM

## 2020-10-01 NOTE — Plan of Care (Signed)
  Problem: RH Balance Goal: LTG Patient will maintain dynamic standing balance (PT) Description: LTG:  Patient will maintain dynamic standing balance with assistance during mobility activities (PT) Flowsheets (Taken 10/01/2020 0740) LTG: Pt will maintain dynamic standing balance during mobility activities with:: Minimal Assistance - Patient > 75% Note: Downgraded from CGA to minA due to slow progress   Problem: RH Ambulation Goal: LTG Patient will ambulate in controlled environment (PT) Description: LTG: Patient will ambulate in a controlled environment, # of feet with assistance (PT). Flowsheets (Taken 10/01/2020 0740) LTG: Pt will ambulate in controlled environ  assist needed:: Moderate Assistance - Patient 50 - 74% LTG: Ambulation distance in controlled environment: 55ft Note: Downgraded from minA to modA due to slow progress. Also decreased gait distance from 116ft to 59ft Goal: LTG Patient will ambulate in home environment (PT) Description: LTG: Patient will ambulate in home environment, # of feet with assistance (PT). Flowsheets (Taken 10/01/2020 0740) LTG: Pt will ambulate in home environ  assist needed:: Moderate Assistance - Patient 50 - 74% Note: Downgraded from minA  to modA due to slow progress   Problem: RH Wheelchair Mobility Goal: LTG Patient will propel w/c in controlled environment (PT) Description: LTG: Patient will propel wheelchair in controlled environment, # of feet with assist (PT) Flowsheets (Taken 10/01/2020 0740) LTG: Pt will propel w/c in controlled environ  assist needed:: Minimal Assistance - Patient > 75% Note: Downgraded from supervision to minA due to slow progress Goal: LTG Patient will propel w/c in home environment (PT) Description: LTG: Patient will propel wheelchair in home environment, # of feet with assistance (PT). Flowsheets (Taken 10/01/2020 0740) LTG: Pt will propel w/c in home environ  assist needed:: Minimal Assistance - Patient > 75% Note:  Downgraded from supervision to minA due to slow progress   Problem: RH Stairs Goal: LTG Patient will ambulate up and down stairs w/assist (PT) Description: LTG: Patient will ambulate up and down # of stairs with assistance (PT) Flowsheets (Taken 10/01/2020 0740) LTG: Pt will ambulate up/down stairs assist needed:: Moderate Assistance - Patient 50 - 74% Note: Downgraded from minA to modA due to slow progress

## 2020-10-01 NOTE — Progress Notes (Signed)
Patient ID: Zachary Flores, male   DOB: 09-Aug-1966, 54 y.o.   MRN: [id number] Pt has been approved for LOG have sent out to area NH's to see if any instrested in offering him a bed. Pt thinks can go with brother but sister to talk with him and brother is staying at friends house

## 2020-10-01 NOTE — Progress Notes (Signed)
[place] PHYSICAL MEDICINE & REHABILITATION PROGRESS NOTE   Subjective/Complaints: Patient seen sitting up in bed this morning.  He states he slept well overnight, awaiting therapies.  He states he feels well.  He is able to tell me that he insured is [employer], which is worst about $500.  ROS: Denies CP, SOB, N/V/D  Objective:   No results found. Recent Labs    10/01/20 0608  WBC 5.5  HGB 13.6  HCT 41.8  PLT 583*   No results for input(s): NA, K, CL, CO2, GLUCOSE, BUN, CREATININE, CALCIUM in the last 72 hours.  Intake/Output Summary (Last 24 hours) at 10/01/2020 1115 Last data filed at 10/01/2020 0740 Gross per 24 hour  Intake 740 ml  Output --  Net 740 ml     Pressure Injury 09/18/20 Buttocks Left;Upper Stage 2 -  Partial thickness loss of dermis presenting as a shallow open injury with a red, pink wound bed without slough. open blister (Active)  09/18/20 2200  Location: Buttocks  Location Orientation: Left;Upper  Staging: Stage 2 -  Partial thickness loss of dermis presenting as a shallow open injury with a red, pink wound bed without slough.  Wound Description (Comments): open blister  Present on Admission: No     Pressure Injury 09/18/20 Buttocks Left;Lower Stage 2 -  Partial thickness loss of dermis presenting as a shallow open injury with a red, pink wound bed without slough. open blister (Active)  09/18/20 2201  Location: Buttocks  Location Orientation: Left;Lower  Staging: Stage 2 -  Partial thickness loss of dermis presenting as a shallow open injury with a red, pink wound bed without slough.  Wound Description (Comments): open blister  Present on Admission: No    Physical Exam: Vital Signs Blood pressure (!) 126/92, pulse (!) 58, temperature 98.6 F (37 C), resp. rate 17, height 5\' 7"  (1.702 m), weight 74.7 kg, SpO2 96 %.  Constitutional: No distress . Vital signs reviewed. HENT: Normocephalic.  Atraumatic. Eyes: EOMI. No discharge. Cardiovascular: No  JVD.  RRR. Respiratory: Normal effort.  No stridor.  Bilateral clear to auscultation. GI: Non-distended.  BS +. Skin: Warm and dry.  Intact.  Buttock wounds not examined today. Psych: Normal mood.  Normal behavior. Musc: No edema in extremities.  No tenderness in extremities. Neuro: Alert Dysarthria, unchanged Follows simple commands.   Limited awareness of deficits  Left neglect, unchanged Left sided apraxia, unchanged Motor: RUE: 4+/5 proximal distal, unchanged RLE: 4-4+/5 proximal distal LUE: 4+/5 proximal distal LLE: Hip flexion, knee extension 4/5, ankle dorsiflexion 3/5  Left upper extremity dysmetria and apraxia, improving  Assessment/Plan: 1. 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  Physiatrist is providing close team supervision and 24 hour management of active medical problems listed below.  Physiatrist and rehab team continue to assess barriers to discharge/monitor patient progress toward functional and medical goals    Medical Problem List and Plan: 1.  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  Antithrombotics: -DVT/anticoagulation: SCDs             -antiplatelet therapy: N/A 3. Pain Management: Tylenol as needed.   Controlled on 10/8 4. Mood: Advised emotional support             -antipsychotic agents: Seroquel 25 mg nightly DC'd 5. Neuropsych: This patient is not fully capable  of making decisions on his own behalf. 6. Skin/Wound Care: foam dressing, continue pressure relief 7. Fluids/Electrolytes/Nutrition: Routine in and outs.    BMP within normal limits on 10/1 8.  Post stroke dysphagia.      Advanced to D3 thins  Overall good p.o. intake  Continue to advance diet as tolerated 9.  History of hypertension.    Lopressor 25 mg twice daily,  decreased to 12.5 twice daily on 9/28, DC'd on 9/30  Lisinopril DC'd Vitals:   09/30/20 1941 10/01/20 0314  BP: 126/85 (!) 126/92  Pulse: 62 (!) 58  Resp: 18 17  Temp: 98.4 F (36.9 C) 98.6 F (37 C)  SpO2: 100% 96%   Mildly elevated diastolic pressures on 10/8 10.  Polysubstance abuse-alcohol, tobacco, cocaine abuse.  Monitor for any signs of withdrawal.    Counsel 11. Urinary retention:   Improved 12.  Thrombocytosis  Platelets 583 on 10/8  LOS: 16 days A FACE TO FACE EVALUATION WAS PERFORMED  Santiel Topper [name] 10/01/2020, 11:15 AM

## 2020-10-01 NOTE — Plan of Care (Signed)
  Problem: Consults °Goal: RH GENERAL PATIENT EDUCATION °Description: Patient will gain knowledge of disease management, pain management bowel, and bladder management during this admission. °Outcome: Progressing °Goal: Skin Care Protocol Initiated - if Braden Score 18 or less °Description: If consults are not indicated, leave blank or document N/A °Outcome: Progressing °Goal: Nutrition Consult-if indicated °Outcome: Progressing °  °Problem: RH BOWEL ELIMINATION °Goal: RH STG MANAGE BOWEL WITH ASSISTANCE °Description: STG Manage Bowel with Assistance. °Outcome: Progressing °  °Problem: RH BLADDER ELIMINATION °Goal: RH STG MANAGE BLADDER WITH ASSISTANCE °Description: STG Manage Bladder With Assistance °Patient will manage bladder with mod assistance. °Outcome: Progressing °Goal: RH STG MANAGE BLADDER WITH EQUIPMENT WITH ASSISTANCE °Description: STG Manage Bladder With Equipment With Assistance °Outcome: Progressing °  °Problem: RH SKIN INTEGRITY °Goal: RH STG SKIN FREE OF INFECTION/BREAKDOWN °Description: Patients skin will be free of infection and break down during this admission. °Outcome: Progressing °  °Problem: RH SAFETY °Goal: RH STG ADHERE TO SAFETY PRECAUTIONS W/ASSISTANCE/DEVICE °Description: STG Adhere to Safety Precautions With Assistance/Device. °Patient will adhere to safety plan and will not have any fall this admission. °Outcome: Progressing °  °Problem: RH PAIN MANAGEMENT °Goal: RH STG PAIN MANAGED AT OR BELOW PT'S PAIN GOAL °Description: Patients pain will be managed at or below the level of 3 on a 0-10 pain scale during this admission. °Outcome: Progressing °  °Problem: RH KNOWLEDGE DEFICIT GENERAL °Goal: RH STG INCREASE KNOWLEDGE OF SELF CARE AFTER HOSPITALIZATION °Description: Patient will gain knowledge of self care during this admission. °Outcome: Progressing °  °

## 2020-10-01 NOTE — NC FL2 (Signed)
[place] MEDICAID FL2 LEVEL OF CARE SCREENING TOOL     IDENTIFICATION  Patient Name: Zachary Flores Birthdate: 03-Nov-1966 Sex: male Admission Date (Current Location): 09/15/2020  [facility] and [place] Number:  Producer, television/film/video and Address:  [facility], [address], [place], [place] [address]      Provider Number: [id number]  Attending Physician Name and Address:  Marcello Fennel, MD  Relative Name and Phone Number:  Malic Rosten [phone]    Current Level of Care: Hospital Recommended Level of Care: Skilled Nursing Facility Prior Approval Number:    Date Approved/Denied:   PASRR Number: [id number]  Discharge Plan: SNF    Current Diagnoses: Patient Active Problem List   Diagnosis Date Noted  . Blood pressure increase diastolic   . Labile blood pressure   . Hemiparesis affecting left side as late effect of stroke (HCC)   . Thrombocytosis   . Hypotension   . Urinary retention   . History of hypertension   . Drug-induced hypotension   . Pressure injury of skin 09/19/2020  . 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 Left-sided weakness   . Polysubstance abuse (HCC)   . Essential hypertension   . Dysphagia, post-stroke   . Cocaine abuse (HCC) 09/10/2020  . Dysarthria 09/10/2020  . Acute metabolic encephalopathy 09/10/2020  . Alcohol withdrawal syndrome with complication, with unspecified complication (HCC)   . Hemorrhagic stroke (HCC) 09/06/2020  . TIA (transient ischemic attack) 06/30/2019  . Alcohol abuse with intoxication (HCC) 06/30/2019  . Elevated LFTs 06/30/2019    Orientation RESPIRATION BLADDER Height & Weight     Situation  Normal Continent Weight: 164 lb 10.9 oz (74.7 kg) Height:  5\' 7"  (170.2 cm)  BEHAVIORAL SYMPTOMS/MOOD NEUROLOGICAL BOWEL NUTRITION STATUS      Continent Diet (Dys 3 thin liquids)  AMBULATORY STATUS COMMUNICATION OF NEEDS Skin    Extensive Assist Verbally Normal                       Personal Care Assistance Level of Assistance  Bathing, Feeding, Dressing Bathing Assistance: Limited assistance Feeding assistance: Limited assistance Dressing Assistance: Limited assistance     Functional Limitations Info  Sight Sight Info: Impaired        SPECIAL CARE FACTORS FREQUENCY  PT (By licensed PT), Speech therapy, OT (By licensed OT)     PT Frequency: 5x week OT Frequency: 5x week     Speech Therapy Frequency: 3-5 x week      Contractures Contractures Info: Not present    Additional Factors Info  Code Status, Allergies Code Status Info: Full Code Allergies Info: NKDA           Current Medications (10/01/2020):  This is the current hospital active medication list Current Facility-Administered Medications  Medication Dose Route Frequency Provider Last Rate Last Admin  . acetaminophen (TYLENOL) tablet 650 mg  650 mg Oral Q4H PRN 12/01/2020, PA-C   650 mg at 09/23/20 1949   Or  . acetaminophen (TYLENOL) suppository 650 mg  650 mg Rectal Q4H PRN Angiulli, 09/25/20, PA-C      . feeding supplement (ENSURE ENLIVE) (ENSURE ENLIVE) liquid 237 mL  237 mL Oral TID BM Angiulli, Mcarthur Rossetti, PA-C   237 mL at 10/01/20 1115  . folic acid (FOLVITE) tablet 1 mg  1 mg Oral Daily Angiulli12/08/21, PA-C   1 mg at 10/01/20 0749  .  multivitamin with minerals tablet 1 tablet  1 tablet Oral Daily Charlton Amor, PA-C   1 tablet at 10/01/20 0748  . pantoprazole (PROTONIX) EC tablet 40 mg  40 mg Oral Daily Charlton Amor, PA-C   40 mg at 10/01/20 0748  . senna-docusate (Senokot-S) tablet 1 tablet  1 tablet Oral BID Charlton Amor, PA-C   1 tablet at 10/01/20 0749  . sorbitol 70 % solution 30 mL  30 mL Oral Daily PRN Charlton Amor, PA-C   30 mL at 09/17/20 2127  . thiamine tablet 100 mg  100 mg Oral Daily Charlton Amor, PA-C   100 mg at 10/01/20 8546     Discharge Medications: Please see  discharge summary for a list of discharge medications.  Relevant Imaging Results:  Relevant Lab Results:   Additional Information SSN: [id number] Will be LOG has applied for SSD and Medicaid  Anaston Koehn, Lemar Livings, LCSW

## 2020-10-01 NOTE — Plan of Care (Signed)
  Problem: RH Balance Goal: LTG: Patient will maintain dynamic sitting balance (OT) Description: LTG:  Patient will maintain dynamic sitting balance with assistance during activities of daily living (OT) Flowsheets (Taken 10/01/2020 1248) LTG: Pt will maintain dynamic sitting balance during ADLs with: (downgrade due to pt making slow progress) Contact Guard/Touching assist   Problem: RH Eating Goal: LTG Patient will perform eating w/assist, cues/equip (OT) Description: LTG: Patient will perform eating with assist, with/without cues using equipment (OT) Flowsheets (Taken 10/01/2020 1250) LTG: Pt will perform eating with assistance level of: Minimal Assistance - Patient > 75%   Problem: RH Dressing Goal: LTG Patient will perform lower body dressing w/assist (OT) Description: LTG: Patient will perform lower body dressing with assist, with/without cues in positioning using equipment (OT) Flowsheets (Taken 10/01/2020 1250) LTG: Pt will perform lower body dressing with assistance level of: (downgrade due to pt making slow progress) Moderate Assistance - Patient 50 - 74% Note: downgrade due to pt making slow progress   Problem: RH Toileting Goal: LTG Patient will perform toileting task (3/3 steps) with assistance level (OT) Description: LTG: Patient will perform toileting task (3/3 steps) with assistance level (OT)  Flowsheets (Taken 10/01/2020 1250) LTG: Pt will perform toileting task (3/3 steps) with assistance level: Moderate Assistance - Patient 50 - 74% Note: downgrade due to pt making slow progress   Problem: RH Functional Use of Upper Extremity Goal: LTG Patient will use RT/LT upper extremity as a (OT) Description: LTG: Patient will use right/left upper extremity as a stabilizer/gross assist/diminished/nondominant/dominant level with assist, with/without cues during functional activity (OT) Flowsheets (Taken 10/01/2020 1250) LTG: Use of upper extremity in functional activities:  LUE as  diminished level  RUE as dominant level LTG: Pt will use upper extremity in functional activity with assistance level of: Minimal Assistance - Patient > 75%

## 2020-10-02 ENCOUNTER — Inpatient Hospital Stay (HOSPITAL_COMMUNITY): Payer: Self-pay

## 2020-10-02 NOTE — Progress Notes (Signed)
[place] PHYSICAL MEDICINE & REHABILITATION PROGRESS NOTE   Subjective/Complaints:   "I'm OK'-  LBM yesterday- no pain.    ROS:   Pt denies SOB, abd pain, CP, N/V/C/D, and vision changes   Objective:   No results found. Recent Labs    10/01/20 0608  WBC 5.5  HGB 13.6  HCT 41.8  PLT 583*   No results for input(s): NA, K, CL, CO2, GLUCOSE, BUN, CREATININE, CALCIUM in the last 72 hours.  Intake/Output Summary (Last 24 hours) at 10/02/2020 1428 Last data filed at 10/02/2020 1243 Gross per 24 hour  Intake 975 ml  Output 100 ml  Net 875 ml     Pressure Injury 09/18/20 Buttocks Left;Upper Stage 2 -  Partial thickness loss of dermis presenting as a shallow open injury with a red, pink wound bed without slough. open blister (Active)  09/18/20 2200  Location: Buttocks  Location Orientation: Left;Upper  Staging: Stage 2 -  Partial thickness loss of dermis presenting as a shallow open injury with a red, pink wound bed without slough.  Wound Description (Comments): open blister  Present on Admission: No     Pressure Injury 09/18/20 Buttocks Left;Lower Stage 2 -  Partial thickness loss of dermis presenting as a shallow open injury with a red, pink wound bed without slough. open blister (Active)  09/18/20 2201  Location: Buttocks  Location Orientation: Left;Lower  Staging: Stage 2 -  Partial thickness loss of dermis presenting as a shallow open injury with a red, pink wound bed without slough.  Wound Description (Comments): open blister  Present on Admission: No    Physical Exam: Vital Signs Blood pressure 118/89, pulse 80, temperature 98.4 F (36.9 C), temperature source Oral, resp. rate 17, height 5\' 7"  (1.702 m), weight 74.7 kg, SpO2 98 %.  Constitutional: No distress . Vital signs reviewed. Sitting up in bed- appears comfortable, NAD HENT: Normocephalic.  Atraumatic. Gold teeth Eyes: EOMI. No discharge. Cardiovascular: RRR Respiratory: CTA B/L- no W/R/R- good air  movement GI: Soft, NT, ND, (+)BS  Skin: Warm and dry.  Intact.  Buttock wounds not examined today. Psych: appropriate Musc: No edema in extremities.  No tenderness in extremities. Neuro: Alert Dysarthria, unchanged Follows simple commands.   Limited awareness of deficits  Left neglect, unchanged Left sided apraxia, unchanged Motor: RUE: 4+/5 proximal distal, unchanged RLE: 4-4+/5 proximal distal LUE: 4+/5 proximal distal LLE: Hip flexion, knee extension 4/5, ankle dorsiflexion 3/5  Left upper extremity dysmetria and apraxia, improving  Assessment/Plan: 1. 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  Physiatrist is providing close team supervision and 24 hour management of active medical problems listed below.  Physiatrist and rehab team continue to assess barriers to discharge/monitor patient progress toward functional and medical goals    Medical Problem List and Plan: 1.  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  Antithrombotics: -DVT/anticoagulation: SCDs             -antiplatelet therapy: N/A 3. Pain Management: Tylenol as needed.   Controlled on 10/8 4. Mood: Advised emotional support             -antipsychotic agents: Seroquel 25 mg nightly DC'd 5. Neuropsych: This patient is not fully capable of making decisions on his own behalf. 6. Skin/Wound Care: foam dressing, continue pressure relief 7. Fluids/Electrolytes/Nutrition: Routine in  and outs.    BMP within normal limits on 10/1 8.  Post stroke dysphagia.      Advanced to D3 thins  Overall good p.o. intake  Continue to advance diet as tolerated 9.  History of hypertension.    Lopressor 25 mg twice daily, decreased to 12.5 twice daily on 9/28, DC'd on 9/30  Lisinopril DC'd Vitals:   10/02/20 0306  10/02/20 1246  BP: 140/85 118/89  Pulse: (!) 56 80  Resp: 18 17  Temp: 97.8 F (36.6 C) 98.4 F (36.9 C)  SpO2: 94% 98%   Mildly elevated diastolic pressures on 10/8  10/9- con't regimen- is controlled today 10.  Polysubstance abuse-alcohol, tobacco, cocaine abuse.  Monitor for any signs of withdrawal.    Counsel 11. Urinary retention:   Improved 12.  Thrombocytosis  Platelets 583 on 10/8  LOS: 17 days A FACE TO FACE EVALUATION WAS PERFORMED  Riese Hellard 10/02/2020, 2:28 PM

## 2020-10-02 NOTE — Progress Notes (Signed)
Physical Therapy Session Note  Patient Details  Name: Zachary Flores MRN: [id number] Date of Birth: 16-Dec-1966  Today's Date: 10/02/2020 PT Individual Time: 1300-1343 PT Individual Time Calculation (min): 43 min   Short Term Goals: Week 3:  PT Short Term Goal 1 (Week 3): STG = LTG due to ELOS  Skilled Therapeutic Interventions/Progress Updates:    Patient received supine in bed agreeable to PT. He denies pain at this time. Patient requiring up to MaxA to don pants, shirt and shoes in bed. Ambulating to bathroom using FWW and ModA x2 for postural control, limb advancement and walker management. Patient unsuccessful with having a bowel movement. MaxA for pericare and clothing management in standing with CGA to remain standing. Patient transferring to TIS wc with ModA x1. LiteGait over treadmill for total of 7'34" and 271ft at an average of 0.38mph. Patient maintaining WBOS, B flexed knees throughout stance phase, minimal hip ext B noted resulting in posterior pelvic rotation. Patient minimally receptive to visual cuing and auditory cuing trying to step to the beat of a song (50-60BPM). Patient most successful when stepping to a "one, two" count. Patient returning to room in TIS wc, seatbelt alarm on, call light within reach.   Therapy Documentation Precautions:  Precautions Precautions: Fall Precaution Comments: left sided weakness; monitor BP Restrictions Weight Bearing Restrictions: No    Therapy/Group: Individual Therapy  Elizebeth Koller, PT, DPT, CBIS 10/02/2020, 7:45 AM

## 2020-10-03 NOTE — Progress Notes (Signed)
[place] PHYSICAL MEDICINE & REHABILITATION PROGRESS NOTE   Subjective/Complaints:   I'm fine- doing my HEP.   Had breakfast- liked dance class.      Pt denies SOB, abd pain, CP, N/V/C/D, and vision changes  Objective:   No results found. Recent Labs    10/01/20 0608  WBC 5.5  HGB 13.6  HCT 41.8  PLT 583*   No results for input(s): NA, K, CL, CO2, GLUCOSE, BUN, CREATININE, CALCIUM in the last 72 hours.  Intake/Output Summary (Last 24 hours) at 10/03/2020 1441 Last data filed at 10/03/2020 0810 Gross per 24 hour  Intake 360 ml  Output 600 ml  Net -240 ml     Pressure Injury 09/18/20 Buttocks Left;Upper Stage 2 -  Partial thickness loss of dermis presenting as a shallow open injury with a red, pink wound bed without slough. open blister (Active)  09/18/20 2200  Location: Buttocks  Location Orientation: Left;Upper  Staging: Stage 2 -  Partial thickness loss of dermis presenting as a shallow open injury with a red, pink wound bed without slough.  Wound Description (Comments): open blister  Present on Admission: No     Pressure Injury 09/18/20 Buttocks Left;Lower Stage 2 -  Partial thickness loss of dermis presenting as a shallow open injury with a red, pink wound bed without slough. open blister (Active)  09/18/20 2201  Location: Buttocks  Location Orientation: Left;Lower  Staging: Stage 2 -  Partial thickness loss of dermis presenting as a shallow open injury with a red, pink wound bed without slough.  Wound Description (Comments): open blister  Present on Admission: No    Physical Exam: Vital Signs Blood pressure (!) 138/99, pulse (!) 53, temperature 97.7 F (36.5 C), resp. rate 17, height 5\' 7"  (1.702 m), weight 74.7 kg, SpO2 100 %.  Constitutional: No distress .sitting up in bed- finished 100% breakfast, NAD HENT: Normocephalic.  Atraumatic. Gold teeth notable Eyes: EOMI. No discharge. Cardiovascular: RRR Respiratory: CTA B/L- no W/R/R- good air  movement GI: Soft, NT, ND, (+)BS   Skin: Warm and dry.  Intact.  Buttock wounds not examined today. Psych: appropriate- smiling Musc: No edema in extremities.  No tenderness in extremities. Neuro: Alert Dysarthria, unchanged Follows simple commands.   Limited awareness of deficits  Left neglect, unchanged Left sided apraxia, unchanged Motor: RUE: 4+/5 proximal distal, unchanged RLE: 4-4+/5 proximal distal LUE: 4+/5 proximal distal LLE: Hip flexion, knee extension 4/5, ankle dorsiflexion 3/5  Left upper extremity dysmetria and apraxia, improving  Assessment/Plan: 1. 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  Physiatrist is providing close team supervision and 24 hour management of active medical problems listed below.  Physiatrist and rehab team continue to assess barriers to discharge/monitor patient progress toward functional and medical goals    Medical Problem List and Plan: 1.  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  Antithrombotics: -DVT/anticoagulation: SCDs             -antiplatelet therapy: N/A 3. Pain Management: Tylenol as needed.   10/10- pain controlled with tylenol prn 4. Mood: Advised emotional support             -antipsychotic agents: Seroquel 25 mg nightly DC'd 5. Neuropsych: This patient is not fully capable of making decisions on his own behalf. 6. Skin/Wound Care: foam dressing, continue pressure  relief 7. Fluids/Electrolytes/Nutrition: Routine in and outs.    BMP within normal limits on 10/1 8.  Post stroke dysphagia.      Advanced to D3 thins  Overall good p.o. intake  Continue to advance diet as tolerated 9.  History of hypertension.    Lopressor 25 mg twice daily, decreased to 12.5 twice daily on 9/28, DC'd on 9/30  Lisinopril  DC'd Vitals:   10/02/20 1923 10/03/20 0513  BP: 117/76 (!) 138/99  Pulse: 67 (!) 53  Resp: 18 17  Temp: 98.4 F (36.9 C) 97.7 F (36.5 C)  SpO2: 98% 100%   Mildly elevated diastolic pressures on 10/8  10/10- BP 110s-138 /70s-99- con't regimen for now 10.  Polysubstance abuse-alcohol, tobacco, cocaine abuse.  Monitor for any signs of withdrawal.    Counsel 11. Urinary retention:   Improved 12.  Thrombocytosis  Platelets 583 on 10/8  10/10- labs in AM  LOS: 18 days A FACE TO FACE EVALUATION WAS PERFORMED  Tayloranne Lekas 10/03/2020, 2:41 PM

## 2020-10-04 ENCOUNTER — Inpatient Hospital Stay (HOSPITAL_COMMUNITY): Payer: Self-pay | Admitting: Occupational Therapy

## 2020-10-04 ENCOUNTER — Inpatient Hospital Stay (HOSPITAL_COMMUNITY): Payer: Self-pay

## 2020-10-04 DIAGNOSIS — G441 Vascular headache, not elsewhere classified: Secondary | ICD-10-CM

## 2020-10-04 LAB — CBC WITH DIFFERENTIAL/PLATELET
Abs Immature Granulocytes: 0 10*3/uL (ref 0.00–0.07)
Basophils Absolute: 0.1 10*3/uL (ref 0.0–0.1)
Basophils Relative: 1 %
Eosinophils Absolute: 0.1 10*3/uL (ref 0.0–0.5)
Eosinophils Relative: 2 %
HCT: 44.5 % (ref 39.0–52.0)
Hemoglobin: 14.4 g/dL (ref 13.0–17.0)
Immature Granulocytes: 0 %
Lymphocytes Relative: 31 %
Lymphs Abs: 2 10*3/uL (ref 0.7–4.0)
MCH: 29.4 pg (ref 26.0–34.0)
MCHC: 32.4 g/dL (ref 30.0–36.0)
MCV: 90.8 fL (ref 80.0–100.0)
Monocytes Absolute: 0.7 10*3/uL (ref 0.1–1.0)
Monocytes Relative: 12 %
Neutro Abs: 3.5 10*3/uL (ref 1.7–7.7)
Neutrophils Relative %: 54 %
Platelets: 455 10*3/uL — ABNORMAL HIGH (ref 150–400)
RBC: 4.9 MIL/uL (ref 4.22–5.81)
RDW: 13.1 % (ref 11.5–15.5)
WBC: 6.4 10*3/uL (ref 4.0–10.5)
nRBC: 0 % (ref 0.0–0.2)

## 2020-10-04 LAB — BASIC METABOLIC PANEL
Anion gap: 12 (ref 5–15)
BUN: 11 mg/dL (ref 6–20)
CO2: 24 mmol/L (ref 22–32)
Calcium: 9.6 mg/dL (ref 8.9–10.3)
Chloride: 103 mmol/L (ref 98–111)
Creatinine, Ser: 0.91 mg/dL (ref 0.61–1.24)
GFR, Estimated: >60 mL/min (ref >60)
Glucose, Bld: 85 mg/dL (ref 70–99)
Potassium: 3.8 mmol/L (ref 3.5–5.1)
Sodium: 139 mmol/L (ref 135–145)

## 2020-10-04 NOTE — Plan of Care (Signed)
°  Problem: Consults Goal: RH GENERAL PATIENT EDUCATION Description: Patient will gain knowledge of disease management, pain management bowel, and bladder management during this admission. Outcome: Progressing Goal: Skin Care Protocol Initiated - if Braden Score 18 or less Description: If consults are not indicated, leave blank or document N/A Outcome: Progressing Goal: Nutrition Consult-if indicated Outcome: Progressing   Problem: RH BOWEL ELIMINATION Goal: RH STG MANAGE BOWEL WITH ASSISTANCE Description: STG Manage Bowel with Assistance. Outcome: Progressing   Problem: RH BLADDER ELIMINATION Goal: RH STG MANAGE BLADDER WITH ASSISTANCE Description: STG Manage Bladder With Assistance Patient will manage bladder with mod assistance. Outcome: Progressing Goal: RH STG MANAGE BLADDER WITH EQUIPMENT WITH ASSISTANCE Description: STG Manage Bladder With Equipment With Assistance Outcome: Progressing   Problem: RH SKIN INTEGRITY Goal: RH STG SKIN FREE OF INFECTION/BREAKDOWN Description: Patients skin will be free of infection and break down during this admission. Outcome: Progressing   Problem: RH SAFETY Goal: RH STG ADHERE TO SAFETY PRECAUTIONS W/ASSISTANCE/DEVICE Description: STG Adhere to Safety Precautions With Assistance/Device. Patient will adhere to safety plan and will not have any fall this admission. Outcome: Progressing   Problem: RH PAIN MANAGEMENT Goal: RH STG PAIN MANAGED AT OR BELOW PT'S PAIN GOAL Description: Patients pain will be managed at or below the level of 3 on a 0-10 pain scale during this admission. Outcome: Progressing   Problem: RH KNOWLEDGE DEFICIT GENERAL Goal: RH STG INCREASE KNOWLEDGE OF SELF CARE AFTER HOSPITALIZATION Description: Patient will gain knowledge of self care during this admission. Outcome: Progressing

## 2020-10-04 NOTE — Progress Notes (Signed)
[place] PHYSICAL MEDICINE & REHABILITATION PROGRESS NOTE   Subjective/Complaints: Patient seen sitting up in bed this morning.  He states he slept well overnight.  He states a good weekend.  He states his vision is improving.  ROS: Denies CP, SOB, N/V/D  Objective:   No results found. Recent Labs    10/04/20 0624  WBC 6.4  HGB 14.4  HCT 44.5  PLT 455*   Recent Labs    10/04/20 0624  NA 139  K 3.8  CL 103  CO2 24  GLUCOSE 85  BUN 11  CREATININE 0.91  CALCIUM 9.6    Intake/Output Summary (Last 24 hours) at 10/04/2020 1257 Last data filed at 10/03/2020 1845 Gross per 24 hour  Intake 260 ml  Output --  Net 260 ml     Pressure Injury 09/18/20 Buttocks Left;Upper Stage 2 -  Partial thickness loss of dermis presenting as a shallow open injury with a red, pink wound bed without slough. open blister (Active)  09/18/20 2200  Location: Buttocks  Location Orientation: Left;Upper  Staging: Stage 2 -  Partial thickness loss of dermis presenting as a shallow open injury with a red, pink wound bed without slough.  Wound Description (Comments): open blister  Present on Admission: No     Pressure Injury 09/18/20 Buttocks Left;Lower Stage 2 -  Partial thickness loss of dermis presenting as a shallow open injury with a red, pink wound bed without slough. open blister (Active)  09/18/20 2201  Location: Buttocks  Location Orientation: Left;Lower  Staging: Stage 2 -  Partial thickness loss of dermis presenting as a shallow open injury with a red, pink wound bed without slough.  Wound Description (Comments): open blister  Present on Admission: No    Physical Exam: Vital Signs Blood pressure (!) 132/96, pulse 61, temperature 98.3 F (36.8 C), temperature source Oral, resp. rate 18, height 5\' 7"  (1.702 m), weight 74.7 kg, SpO2 98 %.  Constitutional: No distress . Vital signs reviewed. HENT: Normocephalic.  Atraumatic. Eyes: EOMI. No discharge. Cardiovascular: No JVD.   RRR. Respiratory: Normal effort.  No stridor.  Bilateral clear to auscultation. GI: Non-distended.  BS +. Skin: Warm and dry.  Intact. Psych: Normal mood.  Normal behavior. Musc: No edema in extremities.  No tenderness in extremities. Neuro: Alert Dysarthria, stable Follows simple commands.   Limited awareness of deficits  Left neglect, stable Left sided apraxia, unchanged Motor: RUE: 4+/5 proximal distal, unchanged RLE: 4-4+/5 proximal distal, stable LUE: 4+/5 proximal distal LLE: Hip flexion, knee extension 4/5, ankle dorsiflexion 3/5  Left upper extremity dysmetria and apraxia, improving  Assessment/Plan: 1. 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  Physiatrist is providing close team supervision and 24 hour management of active medical problems listed below.  Physiatrist and rehab team continue to assess barriers to discharge/monitor patient progress toward functional and medical goals    Medical Problem List and Plan: 1.  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  Antithrombotics: -DVT/anticoagulation: SCDs             -antiplatelet therapy: N/A 3. Pain Management: Tylenol as needed for headaches.  Controlled with meds on 10/11 4. Mood: Advised emotional support             -antipsychotic agents: Seroquel 25 mg nightly DC'd 5. Neuropsych: This patient is  not fully capable of making decisions on his own behalf. 6. Skin/Wound Care: foam dressing, continue pressure relief 7. Fluids/Electrolytes/Nutrition: Routine in and outs.    BMP within normal limits on 10/1 8.  Post stroke dysphagia.      Advanced to regular thins with full supervision  Overall good p.o. intake  Continue to advance diet as tolerated 9.  History of hypertension.    Lopressor 25 mg twice  daily, decreased to 12.5 twice daily on 9/28, DC'd on 9/30  Lisinopril DC'd Vitals:   10/03/20 1940 10/04/20 0453  BP: 133/88 (!) 132/96  Pulse: 62 61  Resp: 17 18  Temp: 98.1 F (36.7 C) 98.3 F (36.8 C)  SpO2: 97% 98%   Slightly labile, relatively controlled on 10/11 10.  Polysubstance abuse-alcohol, tobacco, cocaine abuse.  Monitor for any signs of withdrawal.    Counsel 11. Urinary retention:   Improved 12.  Thrombocytosis  Platelets 455 on 10/11  LOS: 19 days A FACE TO FACE EVALUATION WAS PERFORMED  Rowynn Mcweeney [name] 10/04/2020, 12:57 PM

## 2020-10-04 NOTE — Progress Notes (Signed)
Occupational Therapy Session Note  Patient Details  Name: Zachary Flores MRN: [id number] Date of Birth: 1966-10-26  Today's Date: 10/04/2020 OT Individual Time: 8099-8338 OT Individual Time Calculation (min): 73 min    Short Term Goals: Week 3:  OT Short Term Goal 1 (Week 3): STGs= LTGs due to ELOS  Skilled Therapeutic Interventions/Progress Updates:    Pt supine in bed, no c/o pain, agreeable to OT session.  Pt requesting to complete bathing tomorrow since he had a shower last night with nursing.  Pt requesting to use bathroom.  Supine to sit with supervision.  SPT with min assist and VCs for foot placement.  SPT w/c to 3 in 1 commode (over toilet) using grab bars with min assist and VCs for hand and foot placement.  Pt complete toileting with mod assist for pericare and to pull brief and pants up.  SPT 3 in 1 commode to w/c with min assist and TCs and VCs for hand placement. Pt completed hand hygiene and oral hygiene sitting sinkside with only occasional cues needed for compensatory implementation.  Pt able to open and apply toothpaste to toothbrush today with only VCs.  Pt transported to ortho gym for strengthening and neuro re-ed of LUE.  Pt completed 10 minutes on UBE at 2.5 resistance level, ace wrap applied to secure left hand to handle due to ataxia.  Pt participated in mat resistive push and pull using 8lb weight in laundry basket.  Pt exhibits increased ability to maintain grasp with left hand on laundry basket throughout.  Pt completed ring arc right to left with 3 lb wrist weight applied to LUE for increased proprioceptive input.  Pt struggles to control fine motor movements in order to pinch ring.  Pt returned to room, left in w/c posteriorly tilted, soft call bell in reach, seat belt alarm on.  Therapy Documentation Precautions:  Precautions Precautions: Fall Precaution Comments: left sided weakness; monitor BP Restrictions Weight Bearing Restrictions: No   Therapy/Group:  Individual Therapy  Amie Critchley 10/04/2020, 1:51 PM

## 2020-10-04 NOTE — Progress Notes (Signed)
Physical Therapy Session Note  Patient Details  Name: Zachary Flores MRN: [id number] Date of Birth: 1966/09/12  Today's Date: 10/04/2020 PT Individual Time: 0800-0900 + 1445-1530  PT Individual Time Calculation (min): 60 min  + 45 min  Short Term Goals: Week 3:  PT Short Term Goal 1 (Week 3): STG = LTG due to ELOS  Skilled Therapeutic Interventions/Progress Updates:     1st session: Pt received supine in bed, awake and agreeable to PT session; denies any pain. He was able to remove B socks with minA while supine. Donned B TED's and shoes with totalA, required maxA for donning scrub pants via bridging technique. Pt with improved ability to self initiate donning clothes, using RUE. Supine<>sit with minA with HOB flat, using R bed rail. Able to achieve static sitting balance with decreased time compared to previous sessions. Able to don T-shirt with modA while seated EOB. Squat<>pivot transfer with modA from EOB to w/c, cues for safety, pacing, and technique. He requires frequent reminders for L foot placement prior to initiating transfers as he tends to leave L foot too posteriorly. Pt requesting to brush his teeth, therefore wheeled sinkside for oral care. He was able brush teeth using RUE with setupA. WC transport for time management from his room to hallway outside main therapy gym. Trial of pre-gait using RW with LUE acewrapped to handgrip due to significant apraxia. While standing with RW, he requires modA for standing balance due to forward lean. Performed static standing marching with modA and RW, significant BLE apraxia, LLE>RLE, and he would frequently hit his L knee on RW structure, wide BOS. Deferred RW trials and attempted Fara Boros. Sit<>stand with min/modA from w/c to Fara Boros. Required assist for LUE grasp to grip. He ambulated ~10-18ft with modA with +2 assist, with tech anterior to brace walker and therapist posterior to facilitate gait, assisting with L foot placement, hip  rotation, and cadence. Pt continues to hit his L knee on Fara Boros support and defer's further trials due to this. WC transport back to his room for time management. He ate his breakfast with setupA while seated in w/c, primarily using RUE for feeding although he required assist for opening containers such as syrup, salt&peppers, juice, and milk. He remained seated in recliner with belt alarm on, needs in reach, made comfortable at end of session  2nd session: Pt received seated in w/c, agreeable to PT session; denies any pain. Focus of session to continue gait training/NMR. WC transport for time management from his room to day room gym in preparation for LIteGait with treadmill. Once we arrived there, he then reported need to void. Therefore, returned to his room and used urinal while seated in w/c to void where he was continent of bladder, charted. Unfortunately while voiding, due to UE apraxia, he urinated some in brief and on his pants. Therefore, required maxA for removing pants/brief and maxA for donning new/clean ones. Returned to ortho-gym in w/c with totalA for time management. Donned LiteGait harness with totalA, and performed gait training on treadmill with moderate body weight offloading. He ambulated 83ft with speed of 0.3 m/s. Therapist assisting with L foot advancement, placement, and cadence. He continues to have great difficulty with motor planning/timing, stepping in a sequential manner, decreased L knee extension in stance, forward trunk lean. Continues to struggle with receptiveness to therapist facilitation as well. Required +2 during Litegait training for added safety during removing/placing harness and getting on/off treadmill. WC transport back to his room, stand<>pivot  with modA +1 from w/c to EOB, minA for sit>supine. Ended session with bed rails up, bed alarm on, needs in reach, made comfortable.  Therapy Documentation Precautions:  Precautions Precautions: Fall Precaution  Comments: left sided weakness; monitor BP Restrictions Weight Bearing Restrictions: No   Therapy/Group: Individual Therapy  Peighton Mehra P Kabir Brannock PT 10/04/2020, 7:56 AM

## 2020-10-04 NOTE — Progress Notes (Signed)
Speech Language Pathology Daily Session Note  Patient Details  Name: Zachary Flores MRN: [id number] Date of Birth: 07-23-1966  Today's Date: 10/04/2020 SLP Individual Time: 1120-1205 SLP Individual Time Calculation (min): 45 min  Short Term Goals: Week 3: SLP Short Term Goal 1 (Week 3): STG=LTG due to estimated remaining length of stay  Skilled Therapeutic Interventions:Skilled ST services focused on swallow skills. SLP facilitated PO consumption of regular textured foods with lunch trial tray. Pt required supervision A verbal cues to completely clear right buccal cavity in between bites. Pt demonstrated mod I use of swallow strategies, slowing rate, placing food on right side of oral cavity, intermittent liquid wash and use of finger sweeps when needed. Pt demonstrated x1 overt s/s aspiration following liquid wash when attempting to communicate. SLP provided education to limit verbalization during PO consumption, pt agreed. SLP upgraded diet to regular textures foods with continued full supervision, however pt will likely be able to tolerate intermittent supervision due to increase independence in utilizing swallow strategies. Pt was left in room with call bell within reach and chair alarm set. SLP recommends to continue skilled services.     Pain Pain Assessment Pain Scale: 0-10 Pain Score: 0-No pain  Therapy/Group: Individual Therapy  Anelise Staron  [facility] 10/04/2020, 11:51 AM

## 2020-10-05 ENCOUNTER — Inpatient Hospital Stay (HOSPITAL_COMMUNITY): Payer: Self-pay

## 2020-10-05 ENCOUNTER — Inpatient Hospital Stay (HOSPITAL_COMMUNITY): Payer: Self-pay | Admitting: Speech Pathology

## 2020-10-05 DIAGNOSIS — L89152 Pressure ulcer of sacral region, stage 2: Secondary | ICD-10-CM

## 2020-10-05 NOTE — Progress Notes (Signed)
[place] PHYSICAL MEDICINE & REHABILITATION PROGRESS NOTE   Subjective/Complaints: Patient seen laying in bed this morning.  He states he slept well overnight.  He states he just had a good breakfast.  ROS: Denies CP, SOB, N/V/D  Objective:   No results found. Recent Labs    10/04/20 0624  WBC 6.4  HGB 14.4  HCT 44.5  PLT 455*   Recent Labs    10/04/20 0624  NA 139  K 3.8  CL 103  CO2 24  GLUCOSE 85  BUN 11  CREATININE 0.91  CALCIUM 9.6    Intake/Output Summary (Last 24 hours) at 10/05/2020 1235 Last data filed at 10/05/2020 0757 Gross per 24 hour  Intake 600 ml  Output 375 ml  Net 225 ml     Pressure Injury 09/18/20 Buttocks Left;Upper Stage 2 -  Partial thickness loss of dermis presenting as a shallow open injury with a red, pink wound bed without slough. open blister (Active)  09/18/20 2200  Location: Buttocks  Location Orientation: Left;Upper  Staging: Stage 2 -  Partial thickness loss of dermis presenting as a shallow open injury with a red, pink wound bed without slough.  Wound Description (Comments): open blister  Present on Admission: No     Pressure Injury 09/18/20 Buttocks Left;Lower Stage 2 -  Partial thickness loss of dermis presenting as a shallow open injury with a red, pink wound bed without slough. open blister (Active)  09/18/20 2201  Location: Buttocks  Location Orientation: Left;Lower  Staging: Stage 2 -  Partial thickness loss of dermis presenting as a shallow open injury with a red, pink wound bed without slough.  Wound Description (Comments): open blister  Present on Admission: No    Physical Exam: Vital Signs Blood pressure 139/75, pulse (!) 53, temperature 97.7 F (36.5 C), resp. rate 15, height 5\' 7"  (1.702 m), weight 74.7 kg, SpO2 97 %.  Constitutional: No distress . Vital signs reviewed. HENT: Normocephalic.  Atraumatic. Eyes: Some limitations in left extraocular muscles. No discharge. Cardiovascular: No JVD.   RRR. Respiratory: Normal effort.  No stridor.  Bilateral clear to auscultation. GI: Non-distended.  BS +. Skin: Warm and dry.  Intact. Psych: Normal mood.  Normal behavior. Musc: No edema in extremities.  No tenderness in extremities. Neuro: Alert Dysarthria, unchanged Follows simple commands.   Limited awareness of deficits  Left neglect, improving Left sided apraxia, persistent Motor: RUE: 4+/5 proximal distal, unchanged RLE: 4-4+/5 proximal distal, stable LUE: 4+/5 proximal distal LLE: Hip flexion, knee extension 4/5, ankle dorsiflexion 3/5  Left upper extremity dysmetria and apraxia, improving  Assessment/Plan: 1. 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  Physiatrist is providing close team supervision and 24 hour management of active medical problems listed below.  Physiatrist and rehab team continue to assess barriers to discharge/monitor patient progress toward functional and medical goals    Medical Problem List and Plan: 1.  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  Antithrombotics: -DVT/anticoagulation: SCDs             -antiplatelet therapy: N/A 3. Pain Management: Tylenol as needed for headaches.  Controlled with meds on 10/12 4. Mood: Advised emotional support             -antipsychotic agents: Seroquel 25 mg nightly DC'd 5. Neuropsych: This patient is not fully  capable of making decisions on his own behalf. 6. Skin/Wound Care: foam dressing, continue pressure relief 7. Fluids/Electrolytes/Nutrition: Routine in and outs.    BMP within normal limits on 10/11 8.  Post stroke dysphagia.      Advanced to regular thins with full supervision-tolerating  Overall good p.o. intake  Continue to advance diet as tolerated 9.  History of hypertension.     Lopressor 25 mg twice daily, decreased to 12.5 twice daily on 9/28, DC'd on 9/30  Lisinopril DC'd Vitals:   10/04/20 1952 10/05/20 0448  BP: 113/77 139/75  Pulse: 63 (!) 53  Resp: 18 15  Temp: 98.3 F (36.8 C) 97.7 F (36.5 C)  SpO2: 99% 97%   Controlled on 10/12 10.  Polysubstance abuse-alcohol, tobacco, cocaine abuse.  Monitor for any signs of withdrawal.    Counsel 11. Urinary retention:   Improved 12.  Thrombocytosis  Platelets 455 on 10/11  LOS: 20 days A FACE TO FACE EVALUATION WAS PERFORMED  Calianna Kim [name] 10/05/2020, 12:35 PM

## 2020-10-05 NOTE — Progress Notes (Signed)
Physical Therapy Session Note  Patient Details  Name: Zachary Flores MRN: [id number] Date of Birth: October 05, 1966  Today's Date: 10/05/2020 PT Individual Time: 1000-1110 + 1300-1345 PT Individual Time Calculation (min): 70 min  + 45 min  Short Term Goals: Week 3:  PT Short Term Goal 1 (Week 3): STG = LTG due to ELOS  Skilled Therapeutic Interventions/Progress Updates:     1st session: Pt received supine in bed, agreeable to PT session; denies any pain. Donned pants with mod/maxA while supine, cues for pt using BUE's to assist pulling up over knees/hips. Supine<>sit with minA with HOB flat, use of bedrail. Able to maintain sitting with close supervision. Doffed t-shirt with minA and donned clean t-shirt with minA. Stand<>pivot from EOB to w/c with modA, cues for safety awareness, pacing, hand placement, and monitoring L foot placement as he continues to place it too far backwards during transfers. As usual, pt requesting to brush his teeth. Therefore, wheeled sinkside in w/c and he performed oral care with setupA using RUE for brushing. After completing oral care, he then reported need for toileting. Wheeled into his bathroom, performed stand<>pivot with modA from w/c to 3-1 BSC. Similar cues required as prior for safety awareness, pacing, and setup. He was continent of bowel, charted. Sit<>stand with minA from 3-1 BSC, required modA for standing balance as he used RUE for posterior pericare. Stand<>pivot with modA from 3-1 BSC to w/c and then wheeled sinkside to perform hand hygiene. WC transport for time management from his room to main therapy gym. Stand<>pivot with modA from w/c to mat table, cues for safety and technique. While seated at mat table and mirror in front of him, performed sit<>stands while tasked for placing horseshoes on basketball rim using RUE in standing position. He required modA for completing this and demonstrated significant forward trunk lean in standing. He also continued to  place L foot too far posteriorly during transition, causing him to lean further forward. Because of this, deferred further overlay tasks with standing and focused on blocked practice sit<>stands with mirror for visual support. He performed several repetitions of this, requiring minA for transfer but modA for standing balance. Therapist blocking LLE from kicking backwards during transfers as well and cues for correcting posture using mirror for feedback; limited carryover. Stand<>pivot with modA from mat table back to w/c and returned to his room where he remained reclined in TIS w/c with belt alarm on, needs in reach, made comfortable.   2nd session: Pt received reclined in TIS w/c, agreeable to PT session, denies any pain. Focus of session to initiate w/c mobility via hemi-propulsion. WC transport from his room to main therapy gym for energy conservation and time management. Therapist retrieved manual 18x18 w/c that required time to adapt for 'dumping' seat. Once this was completed, stand<>pivot from his TIS w/c to manual w/c with modA, cues for safety, sequencing, and pacing activity. Educated patient on hemi technique for propelling manual w/c with his more coordinated R hemibody. He required maxA for w/c navigation due to primarily only using his RUE, unable to maintain straight path. Due to his BLE apraxia, limited ability to use R leg to assist with steering while propelling with RUE. Therapist assisting with both R foot placement, steering, and straight path navigation. Trial of using BUE's for propulsion but pt unable to perform due to significant LUE apraxia. Pt reporting moderate fatigue from efforts, requiring brief seated rest breaks. WC transport back to his room with totalA, stand<>pivot from manual w/c  to TIS w/c. He remained reclined in TIS w/c with belt alarm on, needs in reach at end of session.  Therapy Documentation Precautions:  Precautions Precautions: Fall Precaution Comments: left  sided weakness; monitor BP Restrictions Weight Bearing Restrictions: No  Therapy/Group: Individual Therapy  Peniel Hass P Bertha Earwood PT 10/05/2020, 7:50 AM

## 2020-10-05 NOTE — Progress Notes (Signed)
Speech Language Pathology Daily Session Note  Patient Details  Name: Zachary Flores MRN: [id number] Date of Birth: Sep 20, 1966  Today's Date: 10/05/2020 SLP Individual Time: 5188-4166 SLP Individual Time Calculation (min): 27 min  Short Term Goals: Week 3: SLP Short Term Goal 1 (Week 3): STG=LTG due to estimated remaining length of stay  Skilled Therapeutic Interventions: Pt was seen for skilled ST cognitive skills. SLP facilitated session with a semi-complex money scenario/grocery store calcUtask. Pt required Mod A verbal cues for verbal problem solving and error awareness. Pt continues to require cues to reduce impulsivity, as he frequently rushes through tasks prior to having all of the information and does not effectively "double check" for errors. Pt left laying in bed with alarm set and needs within reach. Continue per current plan of care.          Pain Pain Assessment Pain Scale: 0-10 Pain Score: 0-No pain  Therapy/Group: Individual Therapy  Zachary Flores 10/05/2020, 12:06 PM

## 2020-10-05 NOTE — Progress Notes (Signed)
Occupational Therapy Session Note  Patient Details  Name: Zachary Flores MRN: [id number] Date of Birth: 1966-12-03  Today's Date: 10/05/2020 OT Individual Time: 1400-1425 OT Individual Time Calculation (min): 25 min    Short Term Goals: Week 3:  OT Short Term Goal 1 (Week 3): STGs= LTGs due to ELOS  Skilled Therapeutic Interventions/Progress Updates:    Pt resting in w/c upon arrival and agreeable to therapy.  OT intervention with focus on LUE NMR and functional use.  Pt engaged in functional reaching activities/tasks with 2.5# cuff on LUE to facilitate normal movement pattern in open chain movement patterns. Pt also engaged in [facility] tasks with grasp/release of cups and lotion bottles. Pt motivated but requires multiple attempts to complete [facility] activities. Pt remained in w/c with all needs within reach and belt alarm activated.   Therapy Documentation Precautions:  Precautions Precautions: Fall Precaution Comments: left sided weakness; monitor BP Restrictions Weight Bearing Restrictions: No  Pain: Pain Assessment Pain Scale: 0-10 Pain Score: 0-No pain   Therapy/Group: Individual Therapy  Rich Brave 10/05/2020, 2:40 PM

## 2020-10-06 ENCOUNTER — Inpatient Hospital Stay (HOSPITAL_COMMUNITY): Payer: Self-pay

## 2020-10-06 ENCOUNTER — Inpatient Hospital Stay (HOSPITAL_COMMUNITY): Payer: Self-pay | Admitting: Speech Pathology

## 2020-10-06 ENCOUNTER — Inpatient Hospital Stay (HOSPITAL_COMMUNITY): Payer: Self-pay | Admitting: Occupational Therapy

## 2020-10-06 NOTE — Progress Notes (Signed)
Occupational Therapy Session Note  Patient Details  Name: Zachary Flores MRN: [id number] Date of Birth: [birth year]  Today's Date: 10/06/2020 OT Individual Time: 1405-1500 OT Individual Time Calculation (min): 55 min    Short Term Goals: Week 3:  OT Short Term Goal 1 (Week 3): STGs= LTGs due to ELOS  Skilled Therapeutic Interventions/Progress Updates:    Pt supine in bed, reporting stomach cramps but mild, agreeable to OT session.  Pt completed functional transfers including SPT EOB<>w/c, w/c<>TTB, w/c<>3 in 1 commode with mod assist overall and max VCs today, exhibiting increased ataxic movement in RLE and increased impulsiveness.  Pt bathed and dressed sitting on TTB needing min assist for UB dressing, CGA and mod VCs for UB bathing, and min assist for LB.  Pt used long handled sponge with VCs to bathe feet and back.  Pt needed max assist overall for LB dressing.  Pt attempting to have bowel movement standing sinkside therefore transported pt using w/c to bathroom for toilet transfer and pt attempted bowel movement at 3 in 1 commode, however unsuccessful.  Pt needed max assist for clothing mgt and pericare due to increased ataxia with impaired balance.  Pt requesting to return to bed, pt performed bed mobility with min assist.  Call bell in reach, bed alarm on.    Therapy Documentation Precautions:  Precautions Precautions: Fall Precaution Comments: left sided weakness; monitor BP Restrictions Weight Bearing Restrictions: No   Therapy/Group: Individual Therapy  Amie Critchley 10/06/2020, 4:29 PM

## 2020-10-06 NOTE — Patient Care Conference (Signed)
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 Maryla Morrow Care Coodinator Present: Chana Bode, RN, BSN, CRRN;Other (comment) Kriste Basque Dupree, SW) Nurse Present: Willey Blade, RN PT Present: Other (comment) (Christain Manhard, PT) OT Present: Dolphus Jenny, OT SLP Present: Suzzette Righter, CF-SLP PPS Coordinator present : Heriberto Antigua, PT     Current Status/Progress Goal Weekly Team Focus  Bowel/Bladder   Pt continent of B/B with occasional incontinence. LBM 10/05/2020  Less occasional episodes of incontinence  Assess B/B every shift and PRN   Swallow/Nutrition/ Hydration   Regular textures, thin liquids  Supervision  tolerance upgraded diet and independence with swallow strategies   ADL's   min/modA functional transfers,supervision UB ADLs, max/modA LB ADLs; slowly improving GMC LUE.  mod assist LB ADLs; min/sup UB ADLs and functional transfers (downgraded)  self care training, NMR LUE, visual scanning, safety awareness, functional transfer training    Mobility   minA bed mobilitly, modA stand<>pivot transfers, gait ~51ft modA +1 skilled PT, gait ~5ft on LiteGait  minA  Initiate w/c propulsion, functional transfers, gait as tolerated   Communication   supervision-Min intelligibility strategies  Supervision  carryover speech intelligibility strategies   Safety/Cognition/ Behavioral Observations  Min-Mod  Supervision  basic familiar problem solving, emergent awareness, selective attention   Pain   Pt consistently denies pain  Pain rating of <3 at all times  Assss pain every shift and PRN   Skin   Stage 2 pressure injuries to left and right buttocks  No new skin breakdown  Assess skin every shift and PRN     Discharge Planning:  Searching for NH bed, have contacted several facilities and awaiting return calls regarding taking LOG   Team Discussion: Continent/Incontinent. Impulsive, uncoordinated, gait level fluctuates. Requires min-mod cues for activities.  Patient on target to meet rehab goals: yes, impulsivity and ataxia affecting progress. Little progress noted except note improved attention in a quiet environment. Currently mod assist -max assist overall.  *See Care Plan and progress notes for long and short-term goals.   Revisions to Treatment Plan:  Trial swedish walker and other types of walkers for gait. Teaching Needs: Transfers, toileting, gait assistance, medication,etc.  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  Barriers to  Discharge: Medical stability;Decreased family/caregiver support   Possible Resolutions to [employer] Focus: Therapies,  follow labs - platelets, follow BP, continue to advance diet as tolerated   Continued Need for Acute Rehabilitation Level of Care: The patient requires daily medical management by a physician with specialized training in physical medicine and rehabilitation for the following reasons: Direction of a multidisciplinary physical rehabilitation program to maximize functional independence : Yes Medical management of patient stability for increased activity during participation in an intensive rehabilitation regime.: Yes Analysis of laboratory values and/or radiology reports with any subsequent need for medication adjustment and/or medical intervention. : Yes   I attest that I was present, lead the team conference, and concur with the assessment and plan of the team.   Chana Bode B 10/06/2020, 2:31 PM

## 2020-10-06 NOTE — Progress Notes (Signed)
Speech Language Pathology Daily Session Note  Patient Details  Name: Zachary Flores MRN: [id number] Date of Birth: 1966/10/27  Today's Date: 10/06/2020 SLP Individual Time: 1130-1158 SLP Individual Time Calculation (min): 28 min  Short Term Goals: Week 3: SLP Short Term Goal 1 (Week 3): STG=LTG due to estimated remaining length of stay  Skilled Therapeutic Interventions: Pt was seen for skilled ST targeting cognitive goals. SLP facilitated session with a familiar semi-complex card task, during which pt required overall Min A verbal and visual cues for problem solving and error awareness today. His performance on this task was much improved since last session in which it was targeted. He also recalled the instructions/re-taught SLP how to play it with Supervision A verbal cues. Pt left laying in bed with alarm set and needs within reach. Continue per current plan of care.          Pain Pain Assessment Pain Scale: 0-10 Pain Score: 0-No pain  Therapy/Group: Individual Therapy  Zachary Flores 10/06/2020, 12:23 PM

## 2020-10-06 NOTE — Progress Notes (Signed)
Speech Language Pathology Daily Session Note  Patient Details  Name: Zachary Flores MRN: [id number] Date of Birth: 20-Jun-1966  Today's Date: 10/06/2020 SLP Individual Time: 1300-1330 SLP Individual Time Calculation (min): 30 min  Short Term Goals: Week 3: SLP Short Term Goal 1 (Week 3): STG=LTG due to estimated remaining length of stay  Skilled Therapeutic Interventions: Pt was seen for skilled ST targeting speech goals. SLP facilitated session with a sentence level barrier speech task, during which pt required overall Min A verbal cues for use of strategies to increase his intelligibility (specifically slow rate and overarticulation). He was ~85% intelligible in sentence and conversation with SLP. Pt left laying in bed with alarm set and needs within reach. Continue per current plan of care.          Pain Pain Assessment Pain Scale: 0-10 Pain Score: 0-No pain  Therapy/Group: Individual Therapy  [name] 10/06/2020, 12:33 PM

## 2020-10-06 NOTE — Progress Notes (Addendum)
Zachary Flores PHYSICAL MEDICINE & REHABILITATION PROGRESS NOTE   Subjective/Complaints: Patient seen sitting up in a chair and later ambulating with therapies.  He states he slept well overnight.  ROS: Denies CP, SOB, N/V/D  Objective:   No results found. Recent Labs    10/04/20 0624  WBC 6.4  HGB 14.4  HCT 44.5  PLT 455*   Recent Labs    10/04/20 0624  NA 139  K 3.8  CL 103  CO2 24  GLUCOSE 85  BUN 11  CREATININE 0.91  CALCIUM 9.6    Intake/Output Summary (Last 24 hours) at 10/06/2020 1044 Last data filed at 10/06/2020 2595 Gross per 24 hour  Intake 1277 ml  Output 450 ml  Net 827 ml     Pressure Injury 09/18/20 Buttocks Left;Upper Stage 2 -  Partial thickness loss of dermis presenting as a shallow open injury with a red, pink wound bed without slough. open blister (Active)  09/18/20 2200  Location: Buttocks  Location Orientation: Left;Upper  Staging: Stage 2 -  Partial thickness loss of dermis presenting as a shallow open injury with a red, pink wound bed without slough.  Wound Description (Comments): open blister  Present on Admission: No     Pressure Injury 09/18/20 Buttocks Left;Lower Stage 2 -  Partial thickness loss of dermis presenting as a shallow open injury with a red, pink wound bed without slough. open blister (Active)  09/18/20 2201  Location: Buttocks  Location Orientation: Left;Lower  Staging: Stage 2 -  Partial thickness loss of dermis presenting as a shallow open injury with a red, pink wound bed without slough.  Wound Description (Comments): open blister  Present on Admission: No    Physical Exam: Vital Signs Blood pressure 138/85, pulse (!) 59, temperature 97.9 F (36.6 C), temperature source Oral, resp. rate 16, height 5\' 7"  (1.702 m), weight 74.7 kg, SpO2 99 %.  Constitutional: No distress . Vital signs reviewed. HENT: Normocephalic.  Atraumatic. Eyes: Limitations in left eye with extraocular muscles.  No discharge. Cardiovascular:  No JVD.  RRR. Respiratory: Normal effort.  No stridor.  Bilateral clear to auscultation. GI: Non-distended.  BS +. Skin: Warm and dry.  Intact. Psych: Normal mood.  Normal behavior. Musc: No edema in extremities.  No tenderness in extremities. Neuro: Alert Dysarthria, stable Follows simple commands.   Limited awareness of deficits  Left neglect, improving Left sided apraxia, unchanged Motor: RUE: 4+/5 proximal distal, unchanged RLE: 4-4+/5 proximal distal, stable LUE: 4+/5 proximal distal LLE: Hip flexion, knee extension 4/5, ankle dorsiflexion 3/5  Left upper extremity dysmetria and apraxia, improving  Assessment/Plan: 1. 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  Physiatrist is providing close team supervision and 24 hour management of active medical problems listed below.  Physiatrist and rehab team continue to assess barriers to discharge/monitor patient progress toward functional and medical goals    Medical Problem List and Plan: 1.  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 Please see conference note from today as well.  2.  Antithrombotics: -DVT/anticoagulation: SCDs             -antiplatelet therapy: N/A 3. Pain Management: Tylenol as needed for headaches.  Controlled with meds on 10/13 4. Mood: Advised emotional support             -antipsychotic agents: Seroquel 25 mg nightly DC'd 5. Neuropsych: This patient is not fully capable of making decisions on his own behalf. 6. Skin/Wound Care: foam dressing, continue pressure relief 7. Fluids/Electrolytes/Nutrition: Routine in and outs.     BMP within normal limits on 10/11 8.  Post stroke dysphagia.      Advanced to regular thins with full supervision-no reported issues  Overall good p.o. intake  Continue to advance diet as tolerated 9.  History of hypertension.    Lopressor 25 mg twice daily, decreased to 12.5 twice daily on 9/28, DC'd on 9/30  Lisinopril DC'd Vitals:   10/05/20 1944 10/06/20 0304  BP: 111/76 138/85  Pulse: 68 (!) 59  Resp: 18 16  Temp: 97.9 F (36.6 C) 97.9 F (36.6 C)  SpO2: 96% 99%   Relatively controlled on 10/13 10.  Polysubstance abuse-alcohol, tobacco, cocaine abuse.  Monitor for any signs of withdrawal.    Supplementation Thiamine  Counsel 11. Urinary retention:   Improved 12.  Thrombocytosis  Platelets 455 on 10/11, plan order labs for the end of this week  LOS: 21 days A FACE TO FACE EVALUATION WAS PERFORMED  Zachary Flores Zachary Flores 10/06/2020, 10:44 AM

## 2020-10-06 NOTE — Progress Notes (Signed)
Physical Therapy Session Note  Patient Details  Name: Zachary Flores MRN: [id number] Date of Birth: 1966/08/12  Today's Date: 10/06/2020 PT Individual Time: 0800-0905 PT Individual Time Calculation (min): 65 min   Short Term Goals: Week 3:  PT Short Term Goal 1 (Week 3): STG = LTG due to ELOS  Skilled Therapeutic Interventions/Progress Updates:     Patient in bed upon PT arrival. Patient alert and agreeable to PT session. Patient denied pain during session. Requested to get dressed and brush his teeth this morning. Attempted to use the urinal in sitting during session, patient was unsuccessful and asked to attempt at a later time.  Therapeutic Activity: Bed Mobility: Patient performed supine to sit with CGA with increased time for motor planning. Provided verbal cues for sequencing and initiation of pushing up with his elbow then to his hand for improved trunk managment. Patient doffed paper scrub top and donned a T-shirt with mod A, patient able to recall correct sequencing for hemi-technique, but required cues and assist for clothing management due to apraxia. He donned pants with max A to thread LEs through, then spontaneously laid down in supine to pull his pants up with supervision. He then returned to sitting with CGA, as above. Donned B shoes with max A for energy conservation and time management. Performed sitting balance with supervision EOB. Transfers: Patient performed squat pivot to w/c with min A, displayed poor control and safety awareness with transfer requiring assist to land with his his hips in the chair. He performed sit to/from stand x2 with min A +2 in preparation for gait training and stand pivot x1 with min-mod A of 1. Provided verbal cues for sequencing, broke stand pivot into single steps (stand, turn, reach back, then sit) with improved control. Transported patient to the sink to allow him to brush his teeth while seated in the w/c. Performed oral care with set-up  assist.  Gait Training:  Patient ambulated 60 feet x2 performing 2-180 degree truns using B HHA on first trial and B UE assist supporting patient's forearm and axilla and trunk control from body weight of PTs on both side of the patient with mod-max A +2. Ambulated with ataxic gait with significantly wide BOS L>R, poor sequencing, often performing a double or triple step on the L before stepping with the R, increased UE flexion and use of R UE, and mild increased trunk extension. Improved LE motor control and balance with second trial with increased trunk control. Provided verbal cues for sequencing, narrow BOS with visual cues using green line on the floor and external cues to have patient avoid stepping or kicking therapist's feet on either side. Provided cues for "resetting" when patient would misstep, having patient stand still with feet shoulder width and take a deep breath before starting again. Patient had increased difficulty with turns, improved with increased trunk control on second trial. Provided step-by-step cues, as above for stand pivot transfer, when returning to sit in the chair each trial.   Neuromuscular Re-ed: Patient performed the following LE motor contro activities using reciprocal pattern: -Propelled manual w/c with B LEs 4x 20-25 ft, placed 2 pillows behind patient to promote hamstring activation with forward trunk flexion, applied Koban on tennis shoes for increased traction due to very low tread on tennis shoes. Provided cues and demonstration for reciprocal stepping, facilitated hamstring activation and sequencing intermittently with hands at popliteal fossa; required min A with intermittent supervision with improved sequencing with repetition  Patient in TIS w/c with  NT in the room at end of session with breaks locked, seat belt alarm set, and all needs within reach.    Therapy Documentation Precautions:  Precautions Precautions: Fall Precaution Comments: left sided  weakness; monitor BP Restrictions Weight Bearing Restrictions: No   Therapy/Group: Individual Therapy  Dewaun Kinzler L Dagmar Adcox PT, DPT  10/06/2020, 11:34 AM

## 2020-10-06 NOTE — Progress Notes (Signed)
Physical Therapy Session Note  Patient Details  Name: Zachary Flores MRN: [id number] Date of Birth: 1966/12/04  Today's Date: 10/06/2020 PT Individual Time: 1000-1100 PT Individual Time Calculation (min): 60 min   Short Term Goals: Week 2:  PT Short Term Goal 1 (Week 2): Pt will perform bed mobility with minA PT Short Term Goal 2 (Week 2): Pt will maintain unsupported sitting balance with no more than minA PT Short Term Goal 3 (Week 2): Pt will perform bed<>chair transfers with minA and LRAD PT Short Term Goal 4 (Week 2): Pt will ambulate 2ft with modA and LRAD Week 3:  PT Short Term Goal 1 (Week 3): STG = LTG due to ELOS  Skilled Therapeutic Interventions/Progress Updates:    Pt received sitting in TIS w/c, agreeable to PT session. On arrival, he reports need to have BM. Therefore, wheeled into bathroom, stand<>pivot with modA from TIS w/c to 3-1 BSC. He required a bit of time where he was eventually continent of bowel, charted. Sit<>stand with minA from 3-1 BSC, requiring modA for standing balance while he used RUE for posterior pericare. Stand<>pivot with modA from 3-1 BSC to TIS w/c. Wheeled sinkside for hand hygiene. He continues to require frequent reminders for safety awareness during any functional mobility. Occasionally, he will self correct and verbalize need to "slow down." Pt requesting to focus remainder of session on w/c mobility. Therefore, stand<>pivot with modA from TIS w/c to the dumped manual w/c. WC transport to dayroom gym with totalA for time management. Previous therapist wrapped his shoes with coban tape to assist with grip as his tennis shoes have minimal tread. PT provided demonstration prior by using sequencing with exxagerated heel strike in BLE's to propel w/c. With his first 2 attempt, he was able to succesfully propel himself ~71ft with supervision on level surfaces! However, after the 2nd trial, he was unable to complete self propulsion, contributed primarily to BLE  apraxia. Therapist wrapped dycem to bottom of L shoe, surrounding the heel, to assist with grip during heel strike for friction and leverage. This provided slight improvement. He also tried pushing himself backwards using BLE's only which he was able to do much more easily that forward negotiation, however of course limited by safety awareness with backward propulsion. WC transport with totalA back to his room for time management, stand<>pivot with modA from w/c to EOB. Sit>supine with CGA using bed features. He remained semi-reclined in bed at end of session with needs in reach, bed alarm on.   Therapy Documentation Precautions:  Precautions Precautions: Fall Precaution Comments: left sided weakness; monitor BP Restrictions Weight Bearing Restrictions: No   Therapy/Group: Individual Therapy  Cathyrn Deas P Kaimen Peine PT 10/06/2020, 10:57 AM

## 2020-10-06 NOTE — Progress Notes (Signed)
Speech Language Pathology Weekly Progress and Session Note  Patient Details  Name: Zachary Flores MRN: [id number] Date of Birth: 1966-02-08  Beginning of progress report period: September 30, 2020 End of progress report period: October 07, 2020  Today's Date: 10/07/2020 SLP Individual Time: 3716-9678 SLP Individual Time Calculation (min): 42 min  Short Term Goals: Week 3: SLP Short Term Goal 1 (Week 3): STG=LTG due to estimated remaining length of stay SLP Short Term Goal 1 - Progress (Week 3): Progressing toward goal    New Short Term Goals: Week 4: SLP Short Term Goal 1 (Week 4): STG=LTG due to remaining length of stay (pt is pending placement at SNF)  Weekly Progress Updates: Pt has made functional gains and is progressing toward all short and long term goals. Due to decreased caregiver support/options to have 24/7 supervision in a discharge to home, pt is now pending placement at SNF to ensure greatest safety, given need for 24/7 supervision. Pt is currently Min-Mod assist for basic tasks due to cognitive impairments impacting his impulsivity, emergent awareness, problem solving, and attention. He has mild dysarthria impacting his intelligibility at the sentence and conversation levels, requiring Min A cues for implementation of compensatory strategies to increase intelligibility. Pt is now consuming an advanced regular texture diet with thin liquids, minimal overt s/sx aspiration and Supervision-Min A for use of compensatory safe swallow strategies. Pt has demonstrated improved recall, sustained attention, and basic problem solving this week. Pt education is ongoing; no family has been present for ST. Pt would continue to benefit from skilled ST while inpatient in order to maximize functional independence and reduce burden of care prior to discharge. Anticipate that pt will need 24/7 supervision at discharge in addition to ST follow up at next level of care - SNF recommended and planned as  soon as bed is available.      Intensity: Minumum of 1-2 x/day, 30 to 90 minutes Frequency: 3 to 5 out of 7 days Duration/Length of Stay: TBD; now pending SNF placement Treatment/Interventions: Cognitive remediation/compensation;Internal/external aids;Functional tasks;Patient/family education;Speech/Language facilitation;Therapeutic Activities;Dysphagia/aspiration precaution training;Cueing hierarchy   Daily Session  Skilled Therapeutic Interventions: Pt was seen for skilled ST targeting cognitive goals. Pt was more fatigued this morning than in previous encounters, which resulted in a need for increase in verbal and visual cues to maintain arousal, attention, and problem solving within tasks today (mod-max). Near end of session, slightly more success had with a basic card task. When fully alert, he only required Min A verbal cues for problem solving when making basic calculations. Pt left sitting in bed with alarm set and needs within reach. Continue per current plan of care.         Pain Pain Assessment Pain Scale: 0-10 Pain Score: 0-No pain  Therapy/Group: Individual Therapy  [name] 10/07/2020, 12:08 PM

## 2020-10-07 ENCOUNTER — Inpatient Hospital Stay (HOSPITAL_COMMUNITY): Payer: Self-pay | Admitting: Occupational Therapy

## 2020-10-07 ENCOUNTER — Inpatient Hospital Stay (HOSPITAL_COMMUNITY): Payer: Self-pay

## 2020-10-07 ENCOUNTER — Inpatient Hospital Stay (HOSPITAL_COMMUNITY): Payer: Self-pay | Admitting: Speech Pathology

## 2020-10-07 NOTE — Progress Notes (Signed)
Physical Therapy Session Note  Patient Details  Name: Zachary Flores MRN: [id number] Date of Birth: 1966-08-30  Today's Date: 10/07/2020 PT Individual Time: 0800-0900 + 1445-1525  PT Individual Time Calculation (min): 60 min + 40 min  Short Term Goals: Week 3:  PT Short Term Goal 1 (Week 3): STG = LTG due to ELOS  Skilled Therapeutic Interventions/Progress Updates:   1st session:   Pt received supine in bed, agreeable to PT session, denies any pain. Donned B TED's and shoes with totalA, modA for donning scrub pants while supine. Supine<>sit with minA with HOB flat, using bed rail. Stand<>pivot with modA from EOB to TIS w/c, cues for safety, L foot placement, and sequencing. As usual, pt requesting to brush his teeth, therefore, wheeled sinkside and performed oral care with setupA using his RUE. Pt denies need for bathroom needs. WC transport from his room to main therapy gym with totalA for time management. Focus of remainder of session to continue progressing gait training. He ambulated 2x2ft with modA +2 skilled PT on level surfaces. Cues provided for increased L heel strike to promote L knee extension, B foot placement, decreasing his wide BOS, and postural awarness. Continues to be limited by significant BLE ataxia but improvement noted from prior sessions. Then attempted to perform gait with bari RW however after he stood, incontinent of bowel with straining noted. Therefore, deferred further gait and WC transport back to his room. Sit<>stand with minA +2 and ambulated ~63ft with modA +2 to bathroom toilet. Stand>sit with minA with cues for safety, sequencing. TotalA for removing dirty brief and pants. NT called to assist as pt needing extra time for BM. NT arrived, Maloy provided for nursing staff for safety during transfers. He ended session on toilet with NT present.  2nd session:  Pt received supine in bed, agreeable to PT session; denies any pain. Focus of session to continue gait  progressions. Supine<>sit with minA using bed rail with HOB slightly elevated. Stand<>pivot with modA from EOB to manual w/c. WC transport for time management and energy conservation from room to main hallway. While using bariatric/weighted RW, he ambulated 61ft + 89ft (seated rest break) with modA + w/c follow on level surfaces. Continues to demo "cowboy" gait with decreased L knee extension in terminal stance, wide BOS, significant BLE ataxia. Required assist for RW management, hand-over-hand assist for LUE grip (due to apraxia) to RW, and cues for cadence and foot placement. Trial of using targets for foot placement during gait but this may have been too distracting for him, causing worsening gait pattern with increased unsteadiness. WC transport back to his room, stand<>pivot with modA from w/c to EOB. Sit>Supine with minA using bed features. He remained semi-reclined in bed with needs in reach, bed alarm on, bed rails up.  Therapy Documentation Precautions:  Precautions Precautions: Fall Precaution Comments: left sided weakness; monitor BP Restrictions Weight Bearing Restrictions: No  Therapy/Group: Individual Therapy  Amerigo Mcglory P Kaizley Aja PT 10/07/2020, 9:53 AM

## 2020-10-07 NOTE — Progress Notes (Signed)
Occupational Therapy Session Note  Patient Details  Name: [place] Pettengill MRN: [id number] Date of Birth: 01-16-1966  Today's Date: 10/07/2020 OT Individual Time: 1340-1355 OT Individual Time Calculation (min): 15 min    Short Term Goals: Week 3:  OT Short Term Goal 1 (Week 3): STGs= LTGs due to ELOS  Skilled Therapeutic Interventions/Progress Updates:    Pt supine in bed stating "can we do something light today? Im tired".  Pt c/o mild stomach cramping, nursing made aware.  Pt agreeable to bed level tabletop activity to facilitate improved LUE gross and fine motor control.  Pt falling asleep frequently throughout attempted activities including reach/grasp/release medium dowels and wooden blocks with poor ability to follow commands despite max multimodal cueing to increase alertness and promote proprioceptive feedback of LUE.  Pt requesting to end session so he can "rest some".  Missed treatment time: 30 minutes.  Soft call bell in reach, bed alarm on.    Therapy Documentation Precautions:  Precautions Precautions: Fall Precaution Comments: left sided weakness; monitor BP Restrictions Weight Bearing Restrictions: No   Therapy/Group: Individual Therapy  Amie Critchley 10/07/2020, 4:16 PM

## 2020-10-07 NOTE — Progress Notes (Addendum)
Patient ID: Zachary Flores, male   DOB: 10/03/1966, 54 y.o.   MRN: [id number]  Met with pt and spoke with sister-[name] to inform of team conference progress this week and plan still for short term NHP for more rehab then home. Have emailed information to [place] [facility] who reports hub is not working well today. Await response.  3:00 PM Spoke with [facility] who reports they can not take pt at this time. Still awaiting [facility] and [facility] to respond.

## 2020-10-07 NOTE — Plan of Care (Signed)
°  Problem: Consults °Goal: RH GENERAL PATIENT EDUCATION °Description: Patient will gain knowledge of disease management, pain management bowel, and bladder management during this admission. °Outcome: Progressing °Goal: Skin Care Protocol Initiated - if Braden Score 18 or less °Description: If consults are not indicated, leave blank or document N/A °Outcome: Progressing °Goal: Nutrition Consult-if indicated °Outcome: Progressing °  °Problem: RH BOWEL ELIMINATION °Goal: RH STG MANAGE BOWEL WITH ASSISTANCE °Description: STG Manage Bowel with Assistance. °Outcome: Progressing °  °Problem: RH BLADDER ELIMINATION °Goal: RH STG MANAGE BLADDER WITH ASSISTANCE °Description: STG Manage Bladder With Assistance °Patient will manage bladder with mod assistance. °Outcome: Progressing °Goal: RH STG MANAGE BLADDER WITH EQUIPMENT WITH ASSISTANCE °Description: STG Manage Bladder With Equipment With Assistance °Outcome: Progressing °  °Problem: RH SKIN INTEGRITY °Goal: RH STG SKIN FREE OF INFECTION/BREAKDOWN °Description: Patients skin will be free of infection and break down during this admission. °Outcome: Progressing °  °Problem: RH SAFETY °Goal: RH STG ADHERE TO SAFETY PRECAUTIONS W/ASSISTANCE/DEVICE °Description: STG Adhere to Safety Precautions With Assistance/Device. °Patient will adhere to safety plan and will not have any fall this admission. °Outcome: Progressing °  °Problem: RH PAIN MANAGEMENT °Goal: RH STG PAIN MANAGED AT OR BELOW PT'S PAIN GOAL °Description: Patients pain will be managed at or below the level of 3 on a 0-10 pain scale during this admission. °Outcome: Progressing °  °Problem: RH KNOWLEDGE DEFICIT GENERAL °Goal: RH STG INCREASE KNOWLEDGE OF SELF CARE AFTER HOSPITALIZATION °Description: Patient will gain knowledge of self care during this admission. °Outcome: Progressing °  °

## 2020-10-07 NOTE — Plan of Care (Signed)
  Problem: RH Problem Solving Goal: LTG Patient will demonstrate problem solving for (SLP) Description: LTG:  Patient will demonstrate problem solving for basic/complex daily situations with cues  (SLP) Flowsheets (Taken 10/07/2020 1218) LTG Patient will demonstrate problem solving for: Minimal Assistance - Patient > 75% Note: Goal downgraded due to slower than anticipated progress   Problem: RH Attention Goal: LTG Patient will demonstrate this level of attention during functional activites (SLP) Description: LTG:  Patient will will demonstrate this level of attention during functional activites (SLP) Flowsheets Taken 10/07/2020 1218 by [name], CCC-SLP Patient will demonstrate during cognitive/linguistic activities the attention type of: Selective LTG: Patient will demonstrate this level of attention during cognitive/linguistic activities with assistance of (SLP): Minimal Assistance - Patient > 75% Number of minutes patient will demonstrate attention during cognitive/linguistic activities: 10 Taken 09/16/2020 1655 by Angela Nevin, CCC-SLP Patient will demonstrate this level of attention during cognitive/linguistic activities in: Controlled Note: Goal downgraded due to slower than anticipated progress   Problem: RH Awareness Goal: LTG: Patient will demonstrate awareness during functional activites type of (SLP) Description: LTG: Patient will demonstrate awareness during functional activites type of (SLP) Flowsheets (Taken 10/07/2020 1218) LTG: Patient will demonstrate awareness during cognitive/linguistic activities with assistance of (SLP): Minimal Assistance - Patient > 75% Note: Goal downgraded due to slower than anticipated progress

## 2020-10-07 NOTE — Progress Notes (Signed)
Zachary Flores PHYSICAL MEDICINE & REHABILITATION PROGRESS NOTE   Subjective/Complaints: Patient seen sitting up in his chair working with therapy this morning.  He states he slept well overnight.  Discussed vision with therapies and patient, patient appears to have trouble looking to the distance, but no trouble looking at objects close to him.  He states he cannot move yesterday.  ROS: Denies CP, SOB, N/V/D  Objective:   No results found. No results for input(s): WBC, HGB, HCT, PLT in the last 72 hours. No results for input(s): NA, K, CL, CO2, GLUCOSE, BUN, CREATININE, CALCIUM in the last 72 hours.  Intake/Output Summary (Last 24 hours) at 10/07/2020 1145 Last data filed at 10/07/2020 0915 Gross per 24 hour  Intake 537 ml  Output 350 ml  Net 187 ml     Pressure Injury 09/18/20 Buttocks Left;Upper Stage 2 -  Partial thickness loss of dermis presenting as a shallow open injury with a red, pink wound bed without slough. open blister (Active)  09/18/20 2200  Location: Buttocks  Location Orientation: Left;Upper  Staging: Stage 2 -  Partial thickness loss of dermis presenting as a shallow open injury with a red, pink wound bed without slough.  Wound Description (Comments): open blister  Present on Admission: No     Pressure Injury 09/18/20 Buttocks Left;Lower Stage 2 -  Partial thickness loss of dermis presenting as a shallow open injury with a red, pink wound bed without slough. open blister (Active)  09/18/20 2201  Location: Buttocks  Location Orientation: Left;Lower  Staging: Stage 2 -  Partial thickness loss of dermis presenting as a shallow open injury with a red, pink wound bed without slough.  Wound Description (Comments): open blister  Present on Admission: No    Physical Exam: Vital Signs Blood pressure (!) 131/91, pulse (!) 59, temperature 98 F (36.7 C), temperature source Oral, resp. rate 18, height 5\' 7"  (1.702 m), weight 74.7 kg, SpO2 97 %.  Constitutional: No  distress . Vital signs reviewed. HENT: Normocephalic.  Atraumatic. Eyes: Left eye patched.  No discharge. Cardiovascular: No JVD.  RRR. Respiratory: Normal effort.  No stridor.  Bilateral clear to auscultation. GI: Non-distended.  BS +. Skin: Warm and dry.  Buttock ulcers not examined today. Psych: Normal mood.  Normal behavior. Musc: No edema in extremities.  No tenderness in extremities. Neuro: Alert Dysarthria, unchanged Follows simple commands.   Limited awareness of deficits  Left neglect, improving Left sided apraxia and ataxia Motor: RUE: 4+/5 proximal distal, unchanged RLE: 4-4+/5 proximal distal, stable LUE: 4+/5 proximal distal LLE: Hip flexion, knee extension 4/5, ankle dorsiflexion 3/5  Left upper extremity dysmetria and apraxia, improving  Assessment/Plan: 1. 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  Physiatrist is providing close team supervision and 24 hour management of active medical problems listed below.  Physiatrist and rehab team continue to assess barriers to discharge/monitor patient progress toward functional and medical goals    Medical Problem List and Plan: 1.  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  Antithrombotics: -DVT/anticoagulation: SCDs             -antiplatelet therapy: N/A 3. Pain Management: Tylenol as needed for headaches.  Controlled with meds on 10/14 4. Mood: Advised emotional support             -antipsychotic agents: Seroquel  25 mg nightly DC'd 5. Neuropsych: This patient is not fully capable of making decisions on his own behalf. 6. Skin/Wound Care: foam dressing, continue pressure relief 7. Fluids/Electrolytes/Nutrition: Routine in and outs.    BMP within normal limits on 10/11 8.  Post stroke dysphagia.       Advanced to regular thins with full supervision-no reported issues  Overall good p.o. intake  Continue to advance diet as tolerated 9.  History of hypertension.    Lopressor 25 mg twice daily, decreased to 12.5 twice daily on 9/28, DC'd on 9/30  Lisinopril DC'd Vitals:   10/06/20 1933 10/07/20 0342  BP: 112/78 (!) 131/91  Pulse: 74 (!) 59  Resp: 16 18  Temp: 97.9 F (36.6 C) 98 F (36.7 C)  SpO2: 98% 97%   Relatively controlled on 10/14 10.  Polysubstance abuse-alcohol, tobacco, cocaine abuse.  Monitor for any signs of withdrawal.    Supplement thiamine and folic acid  Counsel 11. Urinary retention:   Improved 12.  Thrombocytosis  Platelets 455 on 10/11, labs ordered for tomorrow  LOS: 22 days A FACE TO FACE EVALUATION WAS PERFORMED  Timica Marcom [name] 10/07/2020, 11:45 AM

## 2020-10-08 ENCOUNTER — Inpatient Hospital Stay (HOSPITAL_COMMUNITY): Payer: Self-pay | Admitting: Occupational Therapy

## 2020-10-08 ENCOUNTER — Inpatient Hospital Stay (HOSPITAL_COMMUNITY): Payer: Self-pay

## 2020-10-08 ENCOUNTER — Inpatient Hospital Stay (HOSPITAL_COMMUNITY): Payer: Self-pay | Admitting: Speech Pathology

## 2020-10-08 LAB — CBC WITH DIFFERENTIAL/PLATELET
Abs Immature Granulocytes: 0.01 10*3/uL (ref 0.00–0.07)
Basophils Absolute: 0.1 10*3/uL (ref 0.0–0.1)
Basophils Relative: 1 %
Eosinophils Absolute: 0.1 10*3/uL (ref 0.0–0.5)
Eosinophils Relative: 2 %
HCT: 43.7 % (ref 39.0–52.0)
Hemoglobin: 14.3 g/dL (ref 13.0–17.0)
Immature Granulocytes: 0 %
Lymphocytes Relative: 34 %
Lymphs Abs: 1.8 10*3/uL (ref 0.7–4.0)
MCH: 29.7 pg (ref 26.0–34.0)
MCHC: 32.7 g/dL (ref 30.0–36.0)
MCV: 90.7 fL (ref 80.0–100.0)
Monocytes Absolute: 0.5 10*3/uL (ref 0.1–1.0)
Monocytes Relative: 10 %
Neutro Abs: 2.8 10*3/uL (ref 1.7–7.7)
Neutrophils Relative %: 53 %
Platelets: 362 10*3/uL (ref 150–400)
RBC: 4.82 MIL/uL (ref 4.22–5.81)
RDW: 13 % (ref 11.5–15.5)
WBC: 5.3 10*3/uL (ref 4.0–10.5)
nRBC: 0 % (ref 0.0–0.2)

## 2020-10-08 NOTE — Progress Notes (Signed)
[facility] PHYSICAL MEDICINE & REHABILITATION PROGRESS NOTE   Subjective/Complaints: Patient seen sitting up in bed this morning, working with therapies.  He states he slept well overnight.  He states he feels better after having a bowel movement.  ROS: Denies CP, SOB, N/V/D  Objective:   No results found. Recent Labs    10/08/20 0452  WBC 5.3  HGB 14.3  HCT 43.7  PLT 362   No results for input(s): NA, K, CL, CO2, GLUCOSE, BUN, CREATININE, CALCIUM in the last 72 hours.  Intake/Output Summary (Last 24 hours) at 10/08/2020 1051 Last data filed at 10/08/2020 0758 Gross per 24 hour  Intake 960 ml  Output 450 ml  Net 510 ml     Pressure Injury 09/18/20 Buttocks Left;Upper Stage 2 -  Partial thickness loss of dermis presenting as a shallow open injury with a red, pink wound bed without slough. open blister (Active)  09/18/20 2200  Location: Buttocks  Location Orientation: Left;Upper  Staging: Stage 2 -  Partial thickness loss of dermis presenting as a shallow open injury with a red, pink wound bed without slough.  Wound Description (Comments): open blister  Present on Admission: No     Pressure Injury 09/18/20 Buttocks Left;Lower Stage 2 -  Partial thickness loss of dermis presenting as a shallow open injury with a red, pink wound bed without slough. open blister (Active)  09/18/20 2201  Location: Buttocks  Location Orientation: Left;Lower  Staging: Stage 2 -  Partial thickness loss of dermis presenting as a shallow open injury with a red, pink wound bed without slough.  Wound Description (Comments): open blister  Present on Admission: No    Physical Exam: Vital Signs Blood pressure 131/84, pulse (!) 55, temperature 97.9 F (36.6 C), resp. rate 14, height 5\' 7"  (1.702 m), weight 78.4 kg, SpO2 100 %.  Constitutional: No distress . Vital signs reviewed. HENT: Normocephalic.  Atraumatic. Eyes: EOMI. No discharge. Cardiovascular: No JVD.  RRR. Respiratory: Normal effort.   No stridor.  Bilateral clear to auscultation. GI: Non-distended.  BS +. Skin: Warm and dry.  Buttock ulcers not examined this a.m. Psych: Normal mood.  Normal behavior. Musc: No edema in extremities.  No tenderness in extremities. Neuro: Alert Dysarthria, stable Follows simple commands.   Limited awareness of deficits  Left neglect, improving Left sided apraxia and ataxia, unchanged Motor: RUE: 4+/5 proximal distal, unchanged RLE: 4-4+/5 proximal distal, stable LUE: 4+/5 proximal distal LLE: Hip flexion, knee extension 4/5, ankle dorsiflexion 3/5  Left upper extremity dysmetria and apraxia, improving  Assessment/Plan: 1. 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  Physiatrist is providing close team supervision and 24 hour management of active medical problems listed below.  Physiatrist and rehab team continue to assess barriers to discharge/monitor patient progress toward functional and medical goals    Medical Problem List and Plan: 1.  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  Antithrombotics: -DVT/anticoagulation: SCDs             -antiplatelet therapy: N/A 3. Pain Management: Tylenol as needed for headaches.  Controlled with meds on 10/15 4. Mood: Advised emotional support             -antipsychotic agents: Seroquel 25 mg nightly DC'd 5. Neuropsych: This patient is not fully capable of making decisions on his own behalf.  6. Skin/Wound Care: foam dressing, continue pressure relief 7. Fluids/Electrolytes/Nutrition: Routine in and outs.    BMP within normal limits on 10/11, labs ordered for Monday 8.  Post stroke dysphagia.      Advanced to regular thins with full supervision   Overall good p.o. intake  Continue to advance diet as tolerated 9.  History of  hypertension.    Lopressor 25 mg twice daily, decreased to 12.5 twice daily on 9/28, DC'd on 9/30  Lisinopril DC'd Vitals:   10/07/20 2004 10/08/20 0541  BP: 134/88 131/84  Pulse: 66 (!) 55  Resp: 16 14  Temp:  97.9 F (36.6 C)  SpO2: 98% 100%   Relatively controlled on 10/15 10.  Polysubstance abuse-alcohol, tobacco, cocaine abuse.  Monitor for any signs of withdrawal.    Continue to supplement thiamine and folic acid  Counsel 11. Urinary retention:   Improved 12.  Thrombocytosis: Resolved  Platelets 362 on 10/15  LOS: 23 days A FACE TO FACE EVALUATION WAS PERFORMED  Kellee Sittner [name] 10/08/2020, 10:51 AM

## 2020-10-08 NOTE — Progress Notes (Signed)
Patient ID: Zachary Flores, male   DOB: 1966/07/10, 54 y.o.   MRN: [id number] Awaiitng response from [facility] have called a left a message today. Have also expanded search further out.

## 2020-10-08 NOTE — Progress Notes (Signed)
Physical Therapy Session Note  Patient Details  Name: Zachary Flores MRN: [id number] Date of Birth: March 09, 1966  Today's Date: 10/08/2020 PT Individual Time: 1000-1100 PT Individual Time Calculation (min): 60 min   Short Term Goals: Week 3:  PT Short Term Goal 1 (Week 3): STG = LTG due to ELOS  Skilled Therapeutic Interventions/Progress Updates:    Pt received supine in bed, awake and agreeable to PT session, he denies any pain. Focus of session to continue gait training. Supine<>sit with minA with HOB flat, stand<>pivot with modA from EOB to w/c, cues for safety and technique. WC transport for time management from his room to main therapy gym for energy conesrvation purposes. He ambulated ~35ft with modA +2 with HHA on level surfaces, gait deficits include decreased L knee extension in stance, ataxic BLE gait, very effortful cadence and foot placement, wide BOS, decreased R > L step height. Trial of gait with bariatric RW (to allow clearance for LE's due to ataxia), ambulating ~60ft with modA +2 with hand-over-hand assist to maintain LUE grip to RW due to apraxia. Gait speed decreases with RW and deficits remain similar, slightly worse. During 2nd trial of gait, used targets on ground to assist with visual feedback for foot placement, limited carryover. After seated rest, then performed stair training. He negotiated up/down x4, 6inch steps, with modA +2, using step-to sequencing with RLE leading ascent and LLE leading descent. Pt expressing increased fear of falling and appears anxious while performing stairs. Benefits from relaxation techniques and paced activity. Assist required for LUE grip to HR and advancing LUE along HR as well. Pt requesting to focus remainder of session on w/c mobility. Therefore, stand<>pivot transfer with modA from w/c to mat table to allow therapist to adjust his manual w/c to remove the 'dumped' seating position and this required time to complete. Once completed,  stand<>pivot with modA from mat table to manual w/c. Due to time constraints, unable to attempt w/c mobility with his chair but will attempt as able. WC transport for time management back to his room, stand<>pivot with modA from w/c to EOB with cues for safety awareness, sequencing, and technique. Sit>supine with minA using bed features. He remained supine in bed with bed alarm on, 3/4 rails up, needs in reach.  Therapy Documentation Precautions:  Precautions Precautions: Fall Precaution Comments: left sided weakness; monitor BP Restrictions Weight Bearing Restrictions: No  Therapy/Group: Individual Therapy  Shantell Belongia P Zanylah Hardie PT 10/08/2020, 11:33 AM

## 2020-10-08 NOTE — Plan of Care (Signed)
  Problem: Consults °Goal: RH GENERAL PATIENT EDUCATION °Description: Patient will gain knowledge of disease management, pain management bowel, and bladder management during this admission. °Outcome: Progressing °Goal: Skin Care Protocol Initiated - if Braden Score 18 or less °Description: If consults are not indicated, leave blank or document N/A °Outcome: Progressing °Goal: Nutrition Consult-if indicated °Outcome: Progressing °  °Problem: RH BOWEL ELIMINATION °Goal: RH STG MANAGE BOWEL WITH ASSISTANCE °Description: STG Manage Bowel with Assistance. °Outcome: Progressing °  °Problem: RH BLADDER ELIMINATION °Goal: RH STG MANAGE BLADDER WITH ASSISTANCE °Description: STG Manage Bladder With Assistance °Patient will manage bladder with mod assistance. °Outcome: Progressing °Goal: RH STG MANAGE BLADDER WITH EQUIPMENT WITH ASSISTANCE °Description: STG Manage Bladder With Equipment With Assistance °Outcome: Progressing °  °Problem: RH SKIN INTEGRITY °Goal: RH STG SKIN FREE OF INFECTION/BREAKDOWN °Description: Patients skin will be free of infection and break down during this admission. °Outcome: Progressing °  °Problem: RH SAFETY °Goal: RH STG ADHERE TO SAFETY PRECAUTIONS W/ASSISTANCE/DEVICE °Description: STG Adhere to Safety Precautions With Assistance/Device. °Patient will adhere to safety plan and will not have any fall this admission. °Outcome: Progressing °  °Problem: RH PAIN MANAGEMENT °Goal: RH STG PAIN MANAGED AT OR BELOW PT'S PAIN GOAL °Description: Patients pain will be managed at or below the level of 3 on a 0-10 pain scale during this admission. °Outcome: Progressing °  °Problem: RH KNOWLEDGE DEFICIT GENERAL °Goal: RH STG INCREASE KNOWLEDGE OF SELF CARE AFTER HOSPITALIZATION °Description: Patient will gain knowledge of self care during this admission. °Outcome: Progressing °  °

## 2020-10-08 NOTE — Progress Notes (Signed)
Occupational Therapy Weekly Progress Note  Patient Details  Name: Zachary Flores MRN: [id number] Date of Birth: Jan 17, 1966  Beginning of progress report period: October 01, 2020 End of progress report period: October 08, 2020  Today's Date: 10/08/2020 OT Individual Time: 1505-1550 OT Individual Time Calculation (min): 45 min    Patient has met 0 of 1 short term goals due to STGs=LTGs.  Pt slowly progressing towards LTGs however performance varies from day to day.  Pt does show improvement in motor control overall with increased functional use of LUE.  Pt performs functional transfers for the majority with increased independence and control from mod-max assist to now min assist with sometimes needing mod assist.  Pt also able to donn footwear at bed level with increased independence. Pt continues to show impulsiveness with fair to poor safety awareness and problem solving which is likely to explain performance variance.  Pt overall is motivated to participate and improve with skilled OT to maximize safety and independence during BADLs and functional transfers.  Patient continues to demonstrate the following deficits: muscle weakness, decreased cardiorespiratoy endurance, decreased coordination, decreased visual motor skills, decreased attention, decreased awareness, decreased problem solving, decreased safety awareness and decreased memory and decreased sitting balance, decreased standing balance, decreased postural control and decreased balance strategies and therefore will continue to benefit from skilled OT intervention to enhance overall performance with BADL.  Patient progressing toward long term goals..  Continue plan of care.  OT Short Term Goals Week 4:  OT Short Term Goal 1 (Week 4): STGs=LTGs dt ELOS  Skilled Therapeutic Interventions/Progress Updates:    Pt supine in bed, no c/o pain, reports he doesn't want to wash up, but would like to sit up in chair for OT session.  Pt completed  supine to sit with min assist.  SPT EOB to w/c with min assist and max VCs and TCs for safe body mechanics and hand placement.  Pt participated in tabletop reach, grasp, place task using LUE with medium resistance band secured around palm of hand to provide proprioceptive input throughout reach.  Pt completed picking up and placing items of various sizes and shapes with only minimal dropping occurrences.  Pt demonstrated improved motor control gross and fine of LUE today.  Pt requesting to toilet at end of session.  SPT w/c to 3 in 1 commode using grab bar with min assist and max VCs.  Pt had continent episode of bowel, however straining and unable to completely relieve self.  Nurse made aware.  Toileting required max assist.  SPT commode to w/c using grab bar with min assist.  SPT w/c to EOB with min assist.  Sit to supine with CGA. Call bell in reach, bed alarm on.    Therapy Documentation Precautions:  Precautions Precautions: Fall Precaution Comments: left sided weakness; monitor BP Restrictions Weight Bearing Restrictions: No   Therapy/Group: Individual Therapy  Ezekiel Slocumb 10/08/2020, 5:24 PM

## 2020-10-08 NOTE — Progress Notes (Signed)
Speech Language Pathology Daily Session Note  Patient Details  Name: Zachary Flores MRN: [id number] Date of Birth: 05-09-1966  Today's Date: 10/08/2020 SLP Individual Time: 0832-0900 SLP Individual Time Calculation (min): 28 min  Short Term Goals: Week 4: SLP Short Term Goal 1 (Week 4): STG=LTG due to remaining length of stay (pt is pending placement at SNF)  Skilled Therapeutic Interventions: Pt was seen for skilled ST targeting speech goals. Pt initially required Min A verbal cues for implementation of slower rate of speech to increase intelligibility when generating sentences that contained 2 and 3 syllable words (~90% intelligible). Cues faded to Supervision for use of strategies to maintain 90% intelligibility in conversation about topics unfamiliar to this therapist. Pt left sitting in bed with alarm set and needs within reach. Continue per current plan of care.          Pain Pain Assessment Pain Scale: 0-10 Pain Score: 0-No pain  Therapy/Group: Individual Therapy  [name] 10/08/2020, 12:10 PM

## 2020-10-08 NOTE — Progress Notes (Signed)
Occupational Therapy Session Note  Patient Details  Name: Zachary Flores MRN: [id number] Date of Birth: 1966-07-30  Today's Date: 10/08/2020 OT Individual Time: 6734-1937 OT Individual Time Calculation (min): 57 min    Short Term Goals: Week 3:  OT Short Term Goal 1 (Week 3): STGs= LTGs due to ELOS  Skilled Therapeutic Interventions/Progress Updates:    Pt in bed to start, working on transfer to the EOB with activation of abdominals instead of relying on the bed rail.  Min assist to complete with noted pt having a wet bed and brief.  He completed transfer to the wheelchair with mod assist stand pivot.  He worked on bathing and dressing sit to stand at the sink with supervision for UB and mod assist for LB.  Increased ataxia noted with use of the LUE when washing the RUE and part of the LLE.  He needed mod instructional cueing for sequencing dressing tasks to follow hemi techniques with mod assist to donn a pullover shirt and max assist for donning, pants, and shoes over his gripper socks.  He declined the need to remove his feet and wash his socks.  He was able to then work on standing balance at the sink.  Focused on weight shifting backwards with his trunk and pelvis as he leans too far forward.  Also focused on bringing he feet closer together.  Pt with increased trunk ataxia noted in standing when attempting to move his LEs or when completing simple stand to partial squat transitions.  Finished session with transfer back to the bed stand pivot with mod assist secondary to safety plan recommendations.  Safety alarm in place with the call button and phone in reach.       Therapy Documentation Precautions:  Precautions Precautions: Fall Precaution Comments: left sided weakness; monitor BP Restrictions Weight Bearing Restrictions: No   Pain: Pain Assessment Pain Scale: 0-10 Pain Score: 0-No pain ADL: See Care Tool Section for some details of mobility and selfcare  Therapy/Group:  Individual Therapy  Amillya Chavira OTR/L 10/08/2020, 3:59 PM

## 2020-10-09 NOTE — Progress Notes (Signed)
[facility] PHYSICAL MEDICINE & REHABILITATION PROGRESS NOTE   Subjective/Complaints:  Asks for urinal no c/o, needed laxative to have BM yesterday   ROS: Denies CP, SOB, N/V/D  Objective:   No results found. Recent Labs    10/08/20 0452  WBC 5.3  HGB 14.3  HCT 43.7  PLT 362   No results for input(s): NA, K, CL, CO2, GLUCOSE, BUN, CREATININE, CALCIUM in the last 72 hours.  Intake/Output Summary (Last 24 hours) at 10/09/2020 1017 Last data filed at 10/09/2020 0710 Gross per 24 hour  Intake 600 ml  Output 450 ml  Net 150 ml        Physical Exam: Vital Signs Blood pressure 138/87, pulse (!) 52, temperature (!) 97.5 F (36.4 C), resp. rate 18, height 5\' 7"  (1.702 m), weight 78.4 kg, SpO2 100 %.   General: No acute distress Mood and affect are appropriate Heart: Regular rate and rhythm no rubs murmurs or extra sounds Lungs: Clear to auscultation, breathing unlabored, no rales or wheezes Abdomen: Positive bowel sounds, soft nontender to palpation, nondistended Extremities: No clubbing, cyanosis, or edema Skin: No evidence of breakdown, no evidence of rash  Dysarthria, stable Follows simple commands.   Limited awareness of deficits  Left neglect, improving Left sided apraxia and ataxia, unchanged Motor: RUE: 4+/5 proximal distal, unchanged RLE: 4-4+/5 proximal distal, stable LUE: 4+/5 proximal distal LLE: Hip flexion, knee extension 4/5, ankle dorsiflexion 3/5  Left upper extremity dysmetria and apraxia, improving  Assessment/Plan: 1. 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  Physiatrist is providing close team supervision and 24 hour management of active medical problems listed below.  Physiatrist and rehab team continue to assess barriers to discharge/monitor patient progress toward functional and medical goals    Medical Problem List and  Plan: 1.  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  Antithrombotics: -DVT/anticoagulation: SCDs             -antiplatelet therapy: N/A 3. Pain Management: Tylenol as needed for headaches.  Controlled with meds on 10/16 4. Mood: Advised emotional support             -antipsychotic agents: Seroquel 25 mg nightly DC'd 5. Neuropsych: This patient is not fully capable of making decisions on his own behalf. 6. Skin/Wound Care: foam dressing, continue pressure relief 7. Fluids/Electrolytes/Nutrition: Routine in and outs.    BMP within normal limits on 10/11, labs ordered for Monday 8.  Post stroke dysphagia.      Advanced to regular thins with full supervision   Overall good p.o. intake  Continue to advance diet as tolerated 9.  History of hypertension.    Lopressor 25 mg twice daily, decreased to 12.5 twice daily on 9/28, DC'd on 9/30  Lisinopril DC'd Vitals:   10/08/20 2021 10/09/20 0535  BP: (!) 134/94 138/87  Pulse: 64 (!) 52  Resp: 18 18  Temp: 98 F (36.7 C) (!) 97.5 F (36.4 C)  SpO2: 98% 100%    controlled on 10/16 10.  Polysubstance abuse-alcohol, tobacco, cocaine abuse.  Monitor for any signs of withdrawal.    Continue to supplement thiamine and folic acid  Counsel 11. Urinary retention:   Improved 12.  Thrombocytosis: Resolved  Platelets 362 on 10/15 13.  Dispo awaiting SNF  LOS: 24 days A FACE TO FACE  EVALUATION WAS PERFORMED  Erick Colace 10/09/2020, 10:17 AM

## 2020-10-10 ENCOUNTER — Inpatient Hospital Stay (HOSPITAL_COMMUNITY): Payer: Self-pay | Admitting: Speech Pathology

## 2020-10-10 NOTE — Progress Notes (Signed)
Speech Language Pathology Daily Session Note  Patient Details  Name: Zachary Flores MRN: [id number] Date of Birth: Nov 26, 1966  Today's Date: 10/10/2020 SLP Individual Time: 1350-1430 SLP Individual Time Calculation (min): 40 min  Short Term Goals: Week 4: SLP Short Term Goal 1 (Week 4): STG=LTG due to remaining length of stay (pt is pending placement at SNF)  Skilled Therapeutic Interventions:  Pt was seen for skilled ST targeting cognitive goals.  SLP facilitated the session with a mildly complex error detection task to address attention to detail.  Pt indpendently found ~25% of all errors within task which improved to >75% error detection with min cues for closer attention to task after a second reading of information.  Pt verbalizes good insight into his current limitations but struggles to correct functional errors in the moment, needing overall min assist.  Pt was left in bed with bed alarm set and call bell within reach.    Pain Pain Assessment Pain Scale: 0-10 Pain Score: 0-No pain  Therapy/Group: Individual Therapy  [name], Melanee Spry 10/10/2020, 3:20 PM

## 2020-10-11 ENCOUNTER — Inpatient Hospital Stay (HOSPITAL_COMMUNITY): Payer: Self-pay

## 2020-10-11 ENCOUNTER — Inpatient Hospital Stay (HOSPITAL_COMMUNITY): Payer: Self-pay | Admitting: Occupational Therapy

## 2020-10-11 DIAGNOSIS — K5901 Slow transit constipation: Secondary | ICD-10-CM

## 2020-10-11 LAB — BASIC METABOLIC PANEL
Anion gap: 10 (ref 5–15)
BUN: 12 mg/dL (ref 6–20)
CO2: 27 mmol/L (ref 22–32)
Calcium: 9.5 mg/dL (ref 8.9–10.3)
Chloride: 101 mmol/L (ref 98–111)
Creatinine, Ser: 0.86 mg/dL (ref 0.61–1.24)
GFR, Estimated: >60 mL/min (ref >60)
Glucose, Bld: 99 mg/dL (ref 70–99)
Potassium: 3.8 mmol/L (ref 3.5–5.1)
Sodium: 138 mmol/L (ref 135–145)

## 2020-10-11 MED ORDER — POLYETHYLENE GLYCOL 3350 17 G PO PACK
17.0000 g | PACK | Freq: Every day | ORAL | Status: DC
  Administered 2020-10-11 – 2020-10-16 (×6): 17 g via ORAL
  Filled 2020-10-11 (×6): qty 1

## 2020-10-11 NOTE — Progress Notes (Signed)
Occupational Therapy Session Note  Patient Details  Name: Zachary Flores MRN: [id number] Date of Birth: [birth year]  Today's Date: 10/11/2020 OT Individual Time: 1102-1201 OT Individual Time Calculation (min): 59 min    Short Term Goals: Week 4:  OT Short Term Goal 1 (Week 4): STGs=LTGs dt ELOS  Skilled Therapeutic Interventions/Progress Updates:    Pt worked on showering and dressing to begin session.  Mod assist needed for stand pivot transfer to the shower bench from the wheelchair with use of the grab bars for support.  Pt with increased ataxic movement in his LEs and trunk throughout transfer.  He was able to sit on the tub bench and remove his UB clothing with supervision but needed mod assist for removal of LB clothing sit to stand.  He completed UB bathing with supervision and min instructional cueing for thoroughness.  LB bathing was mod assist sit to stand.  He transferred out to the wheelchair stand pivot for dressing sit to stand at the sink with mod assist.  Increased ataxic movement noted in the LUE greater than the right when donning clothing.  He was able to donn his pullover shirt with min assist but needed overall max assist for threading the brief and pants.  He then stood to pull them up over his hips with min to mod assist.  Therapist had to stabilize the LLE to keep him from placing it too far under the wheelchair when attempting to stand.  He needed total assist for TEDs with max assist for gripper socks.  Finished session with focus on sit to stand transitions and pre-gait tasks, stepping forward and backwards with the RLE while stabilizing on the LLE.  He continues to demonstrate shuffling steps and wide stepping pattern requiring mod assist for balance.  Finished session with pt resting in the wheelchair and with the call button and phone in reach.  Safety belt in place as well with pt awaiting his lunch.   Therapy Documentation Precautions:  Precautions Precautions:  Fall Precaution Comments: left sided weakness; monitor BP Restrictions Weight Bearing Restrictions: No  Pain: Pain Assessment Pain Scale: Faces Pain Score: 0-No pain ADL: See Care Tool Section for some details of mobility and selfcare  Therapy/Group: Individual Therapy  Clema Skousen OTR/L 10/11/2020, 12:47 PM

## 2020-10-11 NOTE — NC FL2 (Signed)
[place] MEDICAID FL2 LEVEL OF CARE SCREENING TOOL     IDENTIFICATION  Patient Name: Zachary Flores Birthdate: 1966-03-21 Sex: male Admission Date (Current Location): 09/15/2020  [facility] and [place] Number:  Producer, television/film/video and Address:  [facility], [address] [place], [place] [address]      Provider Number: [id number]  Attending Physician Name and Address:  Marcello Fennel, MD  Relative Name and Phone Number:  Zeppelin Commisso [phone]    Current Level of Care: Hospital Recommended Level of Care: Skilled Nursing Facility Prior Approval Number:    Date Approved/Denied:   PASRR Number: [id number]  Discharge Plan: SNF    Current Diagnoses: Patient Active Problem List   Diagnosis Date Noted  . Vascular headache   . Blood pressure increase diastolic   . Labile blood pressure   . Hemiparesis affecting left side as late effect of stroke (HCC)   . Thrombocytosis   . Hypotension   . Urinary retention   . History of hypertension   . Drug-induced hypotension   . Pressure injury of skin 09/19/2020  . 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 Left-sided weakness   . Polysubstance abuse (HCC)   . Essential hypertension   . Dysphagia, post-stroke   . Cocaine abuse (HCC) 09/10/2020  . Dysarthria 09/10/2020  . Acute metabolic encephalopathy 09/10/2020  . Alcohol withdrawal syndrome with complication, with unspecified complication (HCC)   . Hemorrhagic stroke (HCC) 09/06/2020  . TIA (transient ischemic attack) 06/30/2019  . Alcohol abuse with intoxication (HCC) 06/30/2019  . Elevated LFTs 06/30/2019    Orientation RESPIRATION BLADDER Height & Weight     Self, Situation, Place  Normal Continent Weight: 172 lb 13.5 oz (78.4 kg) Height:  5\' 7"  (170.2 cm)  BEHAVIORAL SYMPTOMS/MOOD NEUROLOGICAL BOWEL NUTRITION STATUS      Continent Diet (Regular diet thin liquids)   AMBULATORY STATUS COMMUNICATION OF NEEDS Skin   Limited Assist Verbally Normal                       Personal Care Assistance Level of Assistance  Bathing, Feeding, Dressing Bathing Assistance: Limited assistance Feeding assistance: Limited assistance Dressing Assistance: Limited assistance     Functional Limitations Info  Sight Sight Info: Impaired        SPECIAL CARE FACTORS FREQUENCY  PT (By licensed PT), Speech therapy, OT (By licensed OT)     PT Frequency: 5x week OT Frequency: 5x week     Speech Therapy Frequency: 3 x week      Contractures Contractures Info: Not present    Additional Factors Info  Code Status, Allergies Code Status Info: Full Code Allergies Info: NKDA           Current Medications (10/11/2020):  This is the current hospital active medication list Current Facility-Administered Medications  Medication Dose Route Frequency Provider Last Rate Last Admin  . acetaminophen (TYLENOL) tablet 650 mg  650 mg Oral Q4H PRN 10/13/2020, PA-C   650 mg at 09/23/20 1949   Or  . acetaminophen (TYLENOL) suppository 650 mg  650 mg Rectal Q4H PRN Angiulli, 09/25/20, PA-C      . feeding supplement (ENSURE ENLIVE) (ENSURE ENLIVE) liquid 237 mL  237 mL Oral TID BM AngiulliMcarthur Rossetti, PA-C   237 mL at 10/11/20 0913  . folic acid (FOLVITE) tablet 1 mg  1 mg Oral Daily Angiulli, 10/13/20, PA-C  1 mg at 10/11/20 1700  . multivitamin with minerals tablet 1 tablet  1 tablet Oral Daily Charlton Amor, PA-C   1 tablet at 10/11/20 0813  . pantoprazole (PROTONIX) EC tablet 40 mg  40 mg Oral Daily Charlton Amor, PA-C   40 mg at 10/11/20 1749  . senna-docusate (Senokot-S) tablet 1 tablet  1 tablet Oral BID Charlton Amor, PA-C   1 tablet at 10/11/20 4496  . sorbitol 70 % solution 30 mL  30 mL Oral Daily PRN Charlton Amor, PA-C   30 mL at 10/08/20 1635  . thiamine tablet 100 mg  100 mg Oral Daily Charlton Amor, PA-C   100 mg at 10/11/20 7591      Discharge Medications: Please see discharge summary for a list of discharge medications.  Relevant Imaging Results:  Relevant Lab Results:   Additional Information SSN: [id number] Will be LOG has applied for SSD and Medicaid  Vidyuth Belsito, Lemar Livings, LCSW

## 2020-10-11 NOTE — Progress Notes (Addendum)
Patient ID: Zachary Flores, male   DOB: Oct 17, 1966, 54 y.o.   MRN: [id number]  Spoke with [facility] who has denied taking pt. Have expanded search and reached out to [facility] dept regarding other facilities who take LOG  12:10 PM Spoke with [facility] to fax information to see if can offer a bed to pt. Await response.

## 2020-10-11 NOTE — Progress Notes (Addendum)
[place] PHYSICAL MEDICINE & REHABILITATION PROGRESS NOTE   Subjective/Complaints: Patient seen sitting up in bed this morning.  He states he slept well overnight.  He states he feels much better after having a bowel movement.  ROS: Denies CP, SOB, N/V/D  Objective:   No results found. No results for input(s): WBC, HGB, HCT, PLT in the last 72 hours. Recent Labs    10/11/20 0640  NA 138  K 3.8  CL 101  CO2 27  GLUCOSE 99  BUN 12  CREATININE 0.86  CALCIUM 9.5    Intake/Output Summary (Last 24 hours) at 10/11/2020 1503 Last data filed at 10/11/2020 1300 Gross per 24 hour  Intake 596 ml  Output 475 ml  Net 121 ml        Physical Exam: Vital Signs Blood pressure 111/77, pulse 68, temperature 98.5 F (36.9 C), temperature source Oral, resp. rate 14, height 5\' 7"  (1.702 m), weight 78.4 kg, SpO2 98 %.  Constitutional: No distress . Vital signs reviewed. HENT: Normocephalic.  Atraumatic. Eyes: EOMI. No discharge. Cardiovascular: No JVD.  RRR. Respiratory: Normal effort.  No stridor.  Bilateral clear to auscultation. GI: Non-distended.  BS +. Skin: Warm and dry.  Intact. Psych: Normal mood.  Normal behavior. Musc: No edema in extremities.  No tenderness in extremities. Neuro: Alert Dysarthria, unchanged Follows simple commands.   Limited awareness of deficits  Left neglect, improving Left sided apraxia and ataxia, unchanged Motor: RUE: 4+/5 proximal distal, unchanged RLE: 4-4+/5 proximal distal, stable LUE: 4+/5 proximal distal, unchanged LLE: Hip flexion, knee extension 4/5, ankle dorsiflexion 3/5  Left upper extremity dysmetria and apraxia, improving  Assessment/Plan: 1. 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  Physiatrist is providing close team supervision and 24 hour management of active medical problems listed below.  Physiatrist and  rehab team continue to assess barriers to discharge/monitor patient progress toward functional and medical goals    Medical Problem List and Plan: 1.  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  Antithrombotics: -DVT/anticoagulation: SCDs             -antiplatelet therapy: N/A 3. Pain Management: Tylenol as needed for headaches.  Controlled with meds on 10/18 4. Mood: Advised emotional support             -antipsychotic agents: Seroquel 25 mg nightly DC'd 5. Neuropsych: This patient is not fully capable of making decisions on his own behalf. 6. Skin/Wound Care: foam dressing, continue pressure relief 7. Fluids/Electrolytes/Nutrition: Routine in and outs.    BMP within normal limits on 10/18 8.  Post stroke dysphagia.      Advanced to regular thins with full supervision  Overall good p.o. intake  Continue to advance diet as tolerated 9.  History of hypertension.    Lopressor 25 mg twice daily, decreased to 12.5 twice daily on 9/28, DC'd on 9/30  Lisinopril DC'd Vitals:   10/11/20 0450 10/11/20 1424  BP: (!) 136/91 111/77  Pulse: (!) 56 68  Resp: 18 14  Temp: 97.8 F (36.6 C) 98.5 F (36.9 C)  SpO2: 95% 98%   Relatively controlled on 9/18 10.  Polysubstance abuse-alcohol, tobacco, cocaine abuse.  Monitor for any signs of withdrawal.    Supplement thiamine and folic acid  Counsel 11. Urinary retention:   Improved 12.  Thrombocytosis: Resolved  Platelets 362 on 10/15 13.  Slow transit constipation  Bowel meds increased on 10/18  LOS: 26 days A FACE TO FACE EVALUATION WAS PERFORMED  River Mckercher [name] 10/11/2020, 3:03 PM

## 2020-10-11 NOTE — Progress Notes (Signed)
Physical Therapy Session Note  Patient Details  Name: Zachary Flores MRN: [id number] Date of Birth: Mar 31, 1966  Today's Date: 10/11/2020 PT Individual Time: 0900-1000 + 1300-1330 + 1430-1515 PT Individual Time Calculation (min): 60 min  + 30 min + 45 min  Short Term Goals: Week 3:  PT Short Term Goal 1 (Week 3): STG = LTG due to ELOS  Skilled Therapeutic Interventions/Progress Updates:     1st session: Pt received supine in bed, agreeable to PT session, no c/o pain. On arrival, pt reports soiled brief. Removed brief with totalA for time management and provided soaped washcloth where he was able to perform pericare with RUE. TotalA for donning clean brief. Donned B TED's with totalA and scrub pants with maxA. Supine<>sit with CGA with HOB flat, use of bed rail. Able to maintain sitting EOB with close supervision. Required min/modA for donning scrub t-shirt while seated EOB. Stand<>pivot with modA from EOB to manual w/c. Pt requesting to brush teeth and wash his face, therefore wheeled sinkside in chair where he completed this with setupA, using RUE for oral care. WC transport for time management from his room to main therapy gym. Stand<>pivot with modA from w/c to mat table. Performed unilateral seated toe-taps on soft cone, focusing on hip flexion with controlled movements. Required 2nd assist for stabilizing planted foot due to motor apraxia. Significantly more difficult with LLE > RLE. He then completed the same task in standing with min/modA +2 guard. Extremely effortful movement for doing this and frequent LOB requiring correction. After seated rest, he then ambulated ~30ft with modA +2 via hand-held assist, cues provided for narrowed BOS, cadence, foot placement, and efforts to normalize gait pattern. WC transport back to his room where he remained seated in manual w/c with belt alarm on, needs in reach.  2nd session: Pt received sitting in manual w/c, agreeable to PT session without c/o  pain. Focus of session to perform w/c mobility. Donned B shoes with maxA while seated in WC and WC transport with totalA for time management from his room to dayroom gym. Attempted trials of self propelling forwards with BLE's only however pt with difficulty reaching floor. Removed chair cushion to lower him to floor, slight improvement. He required maxA for self propelling ~66ft in w/c. Performed trials of self propelling backwards in w/c which he was able to do with modA, up to 33ft. W/c mobility primarily limited by BLE apraxia. WC transport back to his room, stand<>pivot with modA from w/c to EOB. Sit>supine with minA for BLE management. He ended session semi-reclined in bed with needs in reach, bed alarm on.  3rd session: Pt received supine in bed, awake and agreeable to PT session. Supine<>sit with minA with HOB flat, using bed rail. Required modA for stand<>pivot transfer towards L side from EOB to w/c. WC transport for time management from his room to main therapy gym. Performed sit<>stand with minA facing mat table. Performed standing to tall kneeling with modA +2, R knee leading. Assist for BLE management and trunk control. Once he was in tall kneeling position c/o L knee/thigh pain, requiring modA +2 for repositioning in his knees as he tends to flare knees outward where he then reports improved L knee pain. Used platform in front of him for BUE support with 1 therapist in front and 2nd therapist behind assisting with pelvic weight shifting and monitoring legs as he would tend to kick legs back in tall kneeling position. Performed 2x4 forearm leans in tall kneeling position  on to platform, requiring assist for controlled lowering and returning to upright position. Also just practiced maintaining tall kneeling position for hip/core/LE strengthening and controlling ataxia. He required mod/maxA +2 for returning from tall kneeling to standing position 2/2 apraxia and increased fear of falling. Once seated  in w/c with rest break provided, he ambulated ~26ft with modA +2 via HHA on level ground, gait deficits improved from prior sessions with decreased 'cowboy' gait, improved LLE control and rhythmic cadence. Pt c/o fatigue from busy day of therapies and requesting to lay down. WC transport back to his room, stand<>pivot with modA from w/c to EOB, minA for sit>supine for BLE management. He ended session semi-reclined in bed, needs in reach, bed alarm on.  Therapy Documentation Precautions:  Precautions Precautions: Fall Precaution Comments: left sided weakness; monitor BP Restrictions Weight Bearing Restrictions: No  Therapy/Group: Individual Therapy  Jaishon Krisher P Melyna Huron PT 10/11/2020, 7:57 AM

## 2020-10-12 ENCOUNTER — Inpatient Hospital Stay (HOSPITAL_COMMUNITY): Payer: Self-pay

## 2020-10-12 ENCOUNTER — Inpatient Hospital Stay (HOSPITAL_COMMUNITY): Payer: Self-pay | Admitting: Occupational Therapy

## 2020-10-12 ENCOUNTER — Inpatient Hospital Stay (HOSPITAL_COMMUNITY): Payer: Self-pay | Admitting: Speech Pathology

## 2020-10-12 NOTE — Progress Notes (Signed)
Physical Therapy Weekly Progress Note  Patient Details  Name: Zachary Flores MRN: [id number] Date of Birth: February 04, 1966  Beginning of progress report period: October 01, 2020 End of progress report period: October 12, 2020  Today's Date: 10/12/2020 PT Individual Time:0900-0953 + [id number]  PT Individual Time Calculation (min): 53 min + 54 min   Pt's short term goals = long term goals due to ELOS. He has met 5 out of 10 long term goals.  Pt continuing to making slow progress towards goals. He is able to perform bed mobility with CGA/minA with use of bed rail with HOB flat, requires modA for stand<>pivot transfers, can self propel using BLE's in lowered w/c up to 138f on level surfaces, and has ambulated ~736fwith modA +2 via HHA. He continues to be primarily limited by poor safety awareness, decreased insight into deficits, significant BLE/LUE apraxia, decreased standing balance, and gait deficits including wide BOS, BLE ataxia, decreased lateral weight shift, and impaired cadence.  Patient continues to demonstrate the following deficits muscle weakness, impaired timing and sequencing, unbalanced muscle activation, motor apraxia, ataxia, decreased coordination and decreased motor planning, decreased visual perceptual skills, decreased motor planning and ideational apraxia and decreased attention, decreased awareness, decreased problem solving, decreased safety awareness and decreased memory and therefore will continue to benefit from skilled PT intervention to increase functional independence with mobility.  Patient progressing toward long term goals.  Continue plan of care.  PT Short Term Goals Week 4:  PT Short Term Goal 1 (Week 4): STG = LTG due to ELOS  Skilled Therapeutic Interventions/Progress Updates:     1st session: Pt received supine in bed, agreeable to PT session without c/o pain. Noted saturated brief on arrival. Required totalA for removing and donning new brief for time  management, provided soaped washcloth where he performed pericare with setupA using RUE. Supine<>sit with CGA with HOB flat, using bed rail. Sitting balance with CGA at EOB without BUE support. Performed stand<>pivot with modA from EOB to w/c, cues for safety, foot placement, and sequencing. Pt requesting to brush teeth, therefore wheeled sinkside in w/c where he brushed his teeth using RUE with setupA. Pt also requesting to focus session on w/c mobility. Therefore, WC transport with totalA for time management from his room to main therapy gym. Stand<>pivot with modA from w/c to mat table. Therapist modified his w/c to lower height to the ground to allow LE's to touch floor for BLE propulsion. With w/c lowered, pt still unable to provide adequate leverage with LE's to facilitate w/c mobility. Therefore, removed w/c cushion to allow further leverage which assisted greatly in providing mobility. Pt also required cues for forward lean in w/c as he had tendency to have significant posterior lean in sitting while trying to propel with LE"s. Pt was able to propel ~507fith supervision on level surfaces using BLE's. Performed multiple bouts of this however inconsistent performances ranging from supervision to [place]/c mobility primarily limited by BLE apraxia. Pt returned to his room and remained seated in w/c with cushion provided. Seat belt alarm on, needs in reach.  2nd session: Pt received sitting in manual w/c, agreeable to PT session, reports no pain. WC transport with totalA from his room to main therapy gym. Sit<>stand with minA from w/c level to remove seat cushion. He performed w/c management and he demonstrated improvement in cadence and efficiency with w/c mobility. He was able to propel ~100f63fth supervision on level surfaces using BLE's only! Pt very pleased with his  progress. He repeated this 160f distance x4 bouts total with brief rest breaks b/w trials. Then performed car transfer with modA with  cues provided for correct sequencing and technique; pt with good understanding. He then ambulated 2x74fwith modA +2 via HHA on level surfaces; cues for decreased wide BOS, foot placement, normalizing cadence, and lateral weight shifts. Continues to demo significant BLE ataxia during gait. WC transport back to his room and stand<>pivot with modA from w/c to EOB. Sit>supine with supervision with use of bed rail with HOB flat. He ended session semi-reclined in bed with bed alarm on, needs in reach.  Therapy Documentation Precautions:  Precautions Precautions: Fall Precaution Comments: left sided weakness; monitor BP Restrictions Weight Bearing Restrictions: No  Therapy/Group: Individual Therapy  Amal Renbarger P Tomothy Eddins PT 10/12/2020, 12:51 PM

## 2020-10-12 NOTE — Progress Notes (Signed)
[place] PHYSICAL MEDICINE & REHABILITATION PROGRESS NOTE   Subjective/Complaints: Patient seen laying in bed this morning.  He states he slept well overnight.  He states he feels better after the increase in bowel meds yesterday.  ROS: Denies CP, SOB, N/V/D  Objective:   No results found. No results for input(s): WBC, HGB, HCT, PLT in the last 72 hours. Recent Labs    10/11/20 0640  NA 138  K 3.8  CL 101  CO2 27  GLUCOSE 99  BUN 12  CREATININE 0.86  CALCIUM 9.5    Intake/Output Summary (Last 24 hours) at 10/12/2020 0952 Last data filed at 10/12/2020 0840 Gross per 24 hour  Intake 462 ml  Output 525 ml  Net -63 ml        Physical Exam: Vital Signs Blood pressure (!) 128/91, pulse (!) 54, temperature 98.3 F (36.8 C), resp. rate 15, height 5\' 7"  (1.702 m), weight 78.4 kg, SpO2 96 %.  Constitutional: No distress . Vital signs reviewed. HENT: Normocephalic.  Atraumatic. Eyes: EOMI. No discharge. Cardiovascular: No JVD.  RRR. Respiratory: Normal effort.  No stridor.  Bilateral clear to auscultation. GI: Non-distended.  BS +. Skin: Warm and dry.  Intact. Psych: Normal mood.  Normal behavior. Musc: No edema in extremities.  No tenderness in extremities. Neuro: Alert Dysarthria, stable Follows simple commands.   Limited awareness of deficits  Left neglect, improving Left sided apraxia and ataxia, unchanged Motor: RUE: 4+/5 proximal distal, unchanged RLE: 4-4+/5 proximal distal, stable LUE: 4+/5 proximal distal, stable  LLE: Hip flexion, knee extension 4/5, ankle dorsiflexion 3/5  Left upper extremity dysmetria and apraxia, improving  Assessment/Plan: 1. 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  Physiatrist is providing close team supervision and 24 hour management of active medical problems listed below.  Physiatrist and rehab team continue to  assess barriers to discharge/monitor patient progress toward functional and medical goals    Medical Problem List and Plan: 1.  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  Antithrombotics: -DVT/anticoagulation: SCDs             -antiplatelet therapy: N/A 3. Pain Management: Tylenol as needed for headaches.  Controlled with meds on 10/19 4. Mood: Advised emotional support             -antipsychotic agents: Seroquel 25 mg nightly DC'd 5. Neuropsych: This patient is not fully capable of making decisions on his own behalf. 6. Skin/Wound Care: foam dressing, continue pressure relief 7. Fluids/Electrolytes/Nutrition: Routine in and outs.    BMP within normal limits on 10/18 8.  Post stroke dysphagia.      Advanced to regular thins with full supervision  Overall good p.o. intake  Continue to advance diet as tolerated 9.  History of hypertension.    Lopressor 25 mg twice daily, decreased to 12.5 twice daily on 9/28, DC'd on 9/30  Lisinopril DC'd Vitals:   10/11/20 1920 10/12/20 0359  BP: 116/83 (!) 128/91  Pulse: 66 (!) 54  Resp: 15 15  Temp: 98.3 F (36.8 C) 98.3 F (36.8 C)  SpO2: 96%    Relatively controlled on 10/19 10.  Polysubstance abuse-alcohol, tobacco, cocaine abuse.  Monitor for any signs of withdrawal.    Supplement thiamine and folic acid  Counsel 11. Urinary retention:   Improved 12.  Thrombocytosis: Resolved  Platelets 362 on 10/15 13.  Slow transit constipation  Bowel meds increased on 10/18  Improving  LOS: 27 days A FACE TO FACE EVALUATION WAS PERFORMED  Ardyth Kelso [name] 10/12/2020, 9:52 AM

## 2020-10-12 NOTE — Progress Notes (Signed)
Occupational Therapy Session Note  Patient Details  Name: Zachary Flores MRN: [id number] Date of Birth: Apr 03, 1966  Today's Date: 10/12/2020 OT Individual Time: 1856-3149 OT Individual Time Calculation (min): 47 min    Short Term Goals: Week 4:  OT Short Term Goal 1 (Week 4): STGs=LTGs dt ELOS  Skilled Therapeutic Interventions/Progress Updates:    Pt in bed to start session, completed transfer from supine to sit EOB with mod assist to begin session.  He needed mod assist for donning his shoes with max assist on the left and supervision on the right.  Next, he completed stand pivot transfer to the wheelchair and then to the therapy mat with mod assist and increased ataxia.  He continues to pull the left foot too far under the wheelchair during sit to stand and needs therapist to facilitate weightbearing through the LLE to avoid this as well as block his foot where he cannot pull it back.  Once on the mat, had him work on functional reaching with the LUE to pick up foam blocks that were 1.5" x 3" in length.  Emphasis on slow movements and maintaining visual fixation on the hand when attempting to grasp and release them into a container. Decreased FM coordination noted to complete task as he cannot feel the blocks in his hand for control of pressure.  Progressed to having him try and work from squat position to pick up blocks on the table and place them individually in the container beside of them.  Min to mod assist needed to maintain balance and squat position.  Pt with increased LUE ataxia, knocking the container of blocks off of the table when attempting to place them.  Finished session with return to the wheelchair at mod assist squat pivot and return to the room.  He transferred back to the bed per his request at the same level.  Call button and phone in reach with safety alarm in place.    Therapy Documentation Precautions:  Precautions Precautions: Fall Precaution Comments: left side  ataxia and weakness Restrictions Weight Bearing Restrictions: No   Pain: Pain Assessment Pain Scale: 0-10 Pain Score: 0-No pain ADL: See Care Tool Section for some details of mobility and selfcare  Therapy/Group: Individual Therapy  Rhaelyn Giron OTR/L 10/12/2020, 4:43 PM

## 2020-10-12 NOTE — Progress Notes (Signed)
Speech Language Pathology Daily Session Note  Patient Details  Name: Maxum Cassarino MRN: [id number] Date of Birth: [birth year]  Today's Date: 10/12/2020 SLP Individual Time: 1300-1325 SLP Individual Time Calculation (min): 25 min  Short Term Goals: Week 4: SLP Short Term Goal 1 (Week 4): STG=LTG due to remaining length of stay (pt is pending placement at SNF)  Skilled Therapeutic Interventions: Pt was seen for skilled ST targeting cognitive skills. SLP facilitated session with functional hypothetical word problems and situations related to time. Pt required Min A verbal cues for problem solving to arrive at answers to basic level questions, but increased Mod A when complexity increased beyond basic. He also continues to require increased cueing for awareness of errors (~Mod A). Pt left laying in bed with alarm set and needs within reach. Continue per current plan of care.          Pain Pain Assessment Pain Scale: 0-10 Pain Score: 0-No pain  Therapy/Group: Individual Therapy  [name] 10/12/2020, 3:00 PM

## 2020-10-12 NOTE — Progress Notes (Signed)
Patient ID: Ethan Clayburn, male   DOB: [birth year], 54 y.o.   MRN: [id number]  Spoke with [employer] Cle[employer]ons to ask if had a chance to review information and if could take pt. She has not had a chance to review and will get back with worker with a answer. Spoke with [employer] who is working on [employer] and [employer]. Continue to try to find pt a SNF bed.

## 2020-10-13 ENCOUNTER — Inpatient Hospital Stay (HOSPITAL_COMMUNITY): Payer: Self-pay | Admitting: Occupational Therapy

## 2020-10-13 ENCOUNTER — Inpatient Hospital Stay (HOSPITAL_COMMUNITY): Payer: Self-pay

## 2020-10-13 ENCOUNTER — Inpatient Hospital Stay (HOSPITAL_COMMUNITY): Payer: Self-pay | Admitting: Speech Pathology

## 2020-10-13 NOTE — Patient Care Conference (Signed)
Inpatient RehabilitationTeam Conference and Plan of Care Update Date: 10/13/2020   Time: 11:04 AM    Patient Name: Zachary Flores      Medical Record Number: [id number]  Date of Birth: November 09, 1966 Sex: Male         Room/Bed: 4W20C/4W20C-01 Payor Info: Payor: MEDICAID POTENTIAL / Plan: MEDICAID POTENTIAL / Product Type: *No Product type* /    Admit Date/Time:  09/15/2020  4:24 PM  Primary Diagnosis:  Pontine hemorrhage [facility])  Hospital Problems: Principal Problem:   Pontine hemorrhage (HCC) Active Problems:   Right pontine cerebrovascular accident (HCC)   Pressure injury of skin   Urinary retention   History of hypertension   Drug-induced hypotension   Thrombocytosis   Hypotension   Labile blood pressure   Hemiparesis affecting left side as late effect of stroke (HCC)   Blood pressure increase diastolic   Vascular headache   Slow transit constipation    Expected Discharge Date: Expected Discharge Date:  (? SNF pending)  Team Members Present: Physician leading conference: Dr. Maryla Morrow Care Coodinator Present: Chana Bode, RN, BSN, CRRN;Becky Dupree, LCSW Nurse Present: Despina Hidden, RN PT Present: Wynelle Link, PT OT Present: Perrin Maltese, OT SLP Present: Colin Benton, SLP PPS Coordinator present : Fae Pippin, [place], PT     Current Status/Progress Goal Weekly Team Focus  Bowel/Bladder   pt can be incontinent of bowel and bladder, LBM 10/12/20  To become and remain continent of b and b  Assess q shift and prn   Swallow/Nutrition/ Hydration   regular textures, thin liquids  Supervision  tolerance current diet, carryover safe swallow strategies   ADL's   Supervision for UB bathing with min assist for UB dressing, mod assist for LB dressing sit to stand overall.  Transfers are still mod assist as well. Increased ataxia in the LUE compared to the right, uses at a gross assist level for selfcare tasks.  min to mod assist level  selfcare  retraining, balance retraining, neuromuscular re-education, transfer training, DME education, pt education, therapeutic exercise   Mobility   minA bed mobility, modA stand<>pivot transfers, gait ~72ft modA +2 with HHA, w/c mobility 136ft with supervision/minA  min/modA goals.  w/c propulsion, functional transfers, gait progressions, coordination training, safety awareness   Communication   Supervision-Min A intelligibility strategies, 85-90% intelligible  Supervision  carryover spech intelligibility strategies,   Safety/Cognition/ Behavioral Observations  Min A basic, closer to Mod for emergent awareness  Supervision  basic familiar problem solving, emergent awareness, selective attention   Pain             Skin   No current skin breakdown  To remain free of skin breakdown  Assess q shift and prn     Discharge Planning:  Continue to search for SNF bed,expanded search. Difficulty is facility willing to accept LOG and pt's past substance abuse history   Team Discussion: Note no new skin issues, constipation addressed. Continue to note poor insight in deficits, poor safety awareness,cognitive issues, poor problem solving, decreased awareness , spatial awareness issues, LE apraxia and ataxia,  and UE ataxia. Patient on target to meet rehab goals: yes, currently supervision for UB care and Mod assist for LB B+D, Min/mod assist for transfers.  *See Care Plan and progress notes for long and short-term goals.   Revisions to Treatment Plan:  W/c seat modification for easier use Diet upgraded to regular/ thin liquids Teaching Needs: Transfers, toileting, medications, cues, etc.  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  in the setting of hypertension and cocaine use  Barriers to Discharge: Medical stability;Decreased family/caregiver support   Possible Resolutions to [employer] Focus: Therapies, adjust bowel meds as necessary, monitor BP, continue to advance diet as tolerated   Continued Need for Acute Rehabilitation Level of Care: The patient requires daily medical management by a physician with specialized training in physical medicine and rehabilitation for the following reasons: Direction of a multidisciplinary physical rehabilitation program to maximize functional independence : Yes Medical management of patient stability for increased activity during participation in an intensive rehabilitation regime.: Yes Analysis of laboratory values and/or radiology reports with any subsequent need for medication adjustment and/or medical intervention. : Yes   I attest that I was present, lead the team conference, and concur with the assessment and plan of the team.   Chana Bode B 10/13/2020, 1:41 PM

## 2020-10-13 NOTE — Progress Notes (Signed)
Physical Therapy Session Note  Patient Details  Name: Zachary Flores MRN: [id number] Date of Birth: [birth year]  Today's Date: 10/13/2020 PT Individual Time: 0900-1009 PT Individual Time Calculation (min): 69 min   Short Term Goals: Week 4:  PT Short Term Goal 1 (Week 4): STG = LTG due to ELOS  Skilled Therapeutic Interventions/Progress Updates:    Pt received supine in bed, agreeable to PT session without c/o pain. Supine<>sit with minA using bed rail. Able to sit EOB with supervision. Donned L sock with minA and required modA for R sock. Donned pants with modA while seated EOB, sit<>stand with minA to pull pants over hips. Stand<>pivot with modA from EOB to w/c, cues for safety and technique as he continues to place L foot too far posteriorly during transfer. Pt requesting to brush teeth. Wheeled sinkside where he performed oral care with setupA using RUE. WC transport with totalA for time management from his room to main therapy gym. Performed both seated and standing activities to focus on BLE coordination. This included seated forward foot taps on 4inch block with targets and seated lateral foot taps on 4inch block with targets. Required CGA for truncal support during this. Also performed standing toe taps with 4inch block and targets but required modA for stability and increased difficulty with LLE > RLE. Then performed dynamic standing balance with peg board task, requiring minA for truncal stability while he used RUE to reach across midline. He required x2 seated rest breaks to complete this task 2/2 fatigue and c/o LLE weakness. He then repeated this task using LUE focusing on coordination and controlled movement with minA for standing balance. Increased difficulty with LUE > RUE, due to apraxia and likely impaired stereognosis. Able to complete only 4 pegs with LUE compared to 18 pegs with RUE. Remainder of session focused on w/c mobility. Stand<>pivot with modA from mat table to manual w/c  with cues for safety, L foot placement, and technique. Pt propelled himself ~148ft with minA in manual w/c without cushion on level surfaces, requiring minA for avoiding obstacles in hallways and with turns. Pt using primarily BLE's to propel but would occasionally use RUE to assist with turns. Stand<>pivot with modA from w/c to EOB, minA for sit>supine for LLE management. He remained semi-reclined in bed at end of session with bed rails up, bed alarm on, needs in reach.   Therapy Documentation Precautions:  Precautions Precautions: Fall Precaution Comments: left side ataxia and weakness Restrictions Weight Bearing Restrictions: No  Therapy/Group: Individual Therapy  [name] P Sabra Sessler PT 10/13/2020, 9:42 AM

## 2020-10-13 NOTE — Progress Notes (Signed)
Speech Language Pathology Daily Session Note  Patient Details  Name: Zachary Flores MRN: [id number] Date of Birth: Aug 03, 1966  Today's Date: 10/13/2020 SLP Individual Time: 0730-0825 SLP Individual Time Calculation (min): 55 min  Short Term Goals: Week 4: SLP Short Term Goal 1 (Week 4): STG=LTG due to remaining length of stay (pt is pending placement at SNF)  Skilled Therapeutic Interventions: Pt was seen for skilled ST targeting dysphagia and cognitive goals. SLP provided skilled observation of pt consuming regular texture breakfast with thin liquids to assess tolerance and independence with use of strategies. He demonstrated ability to use compensatory safe swallow strategies Mod I and no overt s/sx aspiration observed. Recommend pt continue current diet and be reduced to intermittent supervision. SLP also administered SLUMS to formally reassess pt's cognitive-linguistic function. He scored 17/30, which is still indicative of moderately severe neurocognitive deficits. Most noteworthy impairments continue to be impulsivity, problem solving, planning, and emergent awareness. Today he also demonstrated some inappropriate laughter to mask deficits; intellectual awareness has improved, which may be contributing to pt's behavior. Of note, pt also with poor performance on delayed recall task, but recall of paragraph info and functional information only required Supervision A. Pt left laying in bed with alarm set and needs within reach. SLP and pt also discussed how awareness impacts his ability to implement speech intelligibility strategies, even though he can recall them Mod I. Continue per current plan of care.        Pain Pain Assessment Pain Scale: 0-10 Pain Score: 0-No pain  Therapy/Group: Individual Therapy  [name] 10/13/2020, 10:25 AM

## 2020-10-13 NOTE — Progress Notes (Signed)
[place] PHYSICAL MEDICINE & REHABILITATION PROGRESS NOTE   Subjective/Complaints: Patient seen laying in bed this AM. He states he slept well overnight.  He denies complaints.   ROS: Denies CP, SOB, N/V/D  Objective:   No results found. No results for input(s): WBC, HGB, HCT, PLT in the last 72 hours. Recent Labs    10/11/20 0640  NA 138  K 3.8  CL 101  CO2 27  GLUCOSE 99  BUN 12  CREATININE 0.86  CALCIUM 9.5    Intake/Output Summary (Last 24 hours) at 10/13/2020 1005 Last data filed at 10/13/2020 0607 Gross per 24 hour  Intake 240 ml  Output 725 ml  Net -485 ml        Physical Exam: Vital Signs Blood pressure 122/79, pulse (!) 58, temperature 98.2 F (36.8 C), resp. rate 16, height 5\' 7"  (1.702 m), weight 78.4 kg, SpO2 95 %.  Constitutional: No distress . Vital signs reviewed. HENT: Normocephalic.  Atraumatic. Eyes: EOMI. No discharge. Cardiovascular: No JVD.  RRR. Respiratory: Normal effort.  No stridor.  Bilateral clear to auscultation. GI: Non-distended.  BS +. Skin: Warm and dry.  Intact. Psych: Normal mood.  Normal behavior. Musc: No edema in extremities.  No tenderness in extremities. Neuro: Alert Dysarthria, unchanged Follows simple commands.   Limited awareness of deficits  Left neglect, improving Left sided apraxia and ataxia, unchanged Motor: RUE: 4+/5 proximal distal, unchanged RLE: 4-4+/5 proximal distal, stable LUE: 4+/5 proximal distal, unchanged LLE: Hip flexion, knee extension 4/5, ankle dorsiflexion 3/5  Left upper extremity dysmetria and apraxia, improving  Assessment/Plan: 1. 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  Physiatrist is providing close team supervision and 24 hour management of active medical problems listed below.  Physiatrist and rehab team continue to assess barriers to discharge/monitor patient  progress toward functional and medical goals    Medical Problem List and Plan: 1.  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 Please see conference note from today as well.  2.  Antithrombotics: -DVT/anticoagulation: SCDs             -antiplatelet therapy: N/A 3. Pain Management: Tylenol as needed for headaches.  Controlled with meds on 10/20 4. Mood: Advised emotional support             -antipsychotic agents: Seroquel 25 mg nightly DC'd 5. Neuropsych: This patient is is?  Fully capable of making decisions on his own behalf. 6. Skin/Wound Care: foam dressing, continue pressure relief 7. Fluids/Electrolytes/Nutrition: Routine in and outs.    BMP within normal limits on 10/18 8.  Post stroke dysphagia.      Advanced to regular thins, now with intermittent supervision  Overall good p.o. intake  Continue to advance diet as tolerated 9.  History of hypertension.    Lopressor 25 mg twice daily, decreased to 12.5 twice daily on 9/28, DC'd on 9/30  Lisinopril DC'd Vitals:   10/12/20 2048 10/13/20 0354  BP: 115/74 122/79  Pulse: 63 (!) 58  Resp: 16 16  Temp: 98.2 F (36.8 C) 98.2 F (36.8 C)  SpO2: 97% 95%   Relatively controlled on 10/20 10.  Polysubstance abuse-alcohol, tobacco, cocaine abuse.  Monitor for any signs of withdrawal.    Supplement thiamine and folic acid  Counsel 11. Urinary retention:   Appears to be improving 12.  Thrombocytosis: Resolved  Platelets 362 on 10/15 13.  Slow transit constipation  Bowel meds increased on 10/18  Improving with meds  LOS: 28 days A FACE TO FACE EVALUATION WAS PERFORMED  Dmoni Fortson [name] 10/13/2020, 10:05 AM

## 2020-10-13 NOTE — Progress Notes (Signed)
Occupational Therapy Session Note  Patient Details  Name: Zachary Flores MRN: [id number] Date of Birth: [birth year]  Today's Date: 10/13/2020 OT Individual Time: 2330-0762 OT Individual Time Calculation (min): 57 min    Short Term Goals: Week 4:  OT Short Term Goal 1 (Week 4): STGs=LTGs dt ELOS  Skilled Therapeutic Interventions/Progress Updates:    Pt completed supine to sit EOB to start session with min assist.  He then transferred over to the wheelchair at mod assist level squat/stand pivot.  Had him focus on bathing at the sink sit to stand.  He was able to complete UB bathing with supervision and min instructional cueing for thoroughness.  Integrated the LUE for washing the right arm as well as for squeezing out the washcloth.  He was also able to hold the soap, open it, and then pour it into the wash basin with the LUE.  Increased ataxic movement noted.  He needed min assist for sit to stand with assist to block the left foot so he didn't pull it under the chair too far.  He was able to complete peri washing at min assist level in standing.  Therapist assisted with donning brief and then pulled it along with his pants over his hips at min assist.  Did not remove pants this session secondary to them still being clean.  He was able to wash his lower legs and feet with supervision and increased time. Therapist assisted with donning TEDs and then he was able to donn the gripper socks with overall min assist.  Finished session with completion of oral hygiene in sitting with setup.  Transferred back to the bed with mod assist from the wheelchair and then to supine with min guard assist.  Call button and phone in reach as well as urinal and safety bed alarm in place.    Therapy Documentation Precautions:  Precautions Precautions: Fall Precaution Comments: left side ataxia and weakness Restrictions Weight Bearing Restrictions: No   Pain: Pain Assessment Pain Scale: Faces Pain Score: 0-No  pain ADL: See Care Tool Section for some details of mobility and selfcare  Therapy/Group: Individual Therapy  Penne Rosenstock OTR/L 10/13/2020, 4:07 PM

## 2020-10-14 ENCOUNTER — Inpatient Hospital Stay (HOSPITAL_COMMUNITY): Payer: Self-pay | Admitting: Speech Pathology

## 2020-10-14 ENCOUNTER — Inpatient Hospital Stay (HOSPITAL_COMMUNITY): Payer: Self-pay

## 2020-10-14 ENCOUNTER — Inpatient Hospital Stay (HOSPITAL_COMMUNITY): Payer: Self-pay | Admitting: Occupational Therapy

## 2020-10-14 DIAGNOSIS — R32 Unspecified urinary incontinence: Secondary | ICD-10-CM

## 2020-10-14 NOTE — Progress Notes (Signed)
Speech Language Pathology Weekly Progress and Session Note  Patient Details  Name: Zachary Flores MRN: [id number] Date of Birth: July 26, 1966  Beginning of progress report period: October 07, 2020 End of progress report period: October 14, 2020  Today's Date: 10/14/2020 SLP Individual Time: 1300-1355 SLP Individual Time Calculation (min): 55 min  Short Term Goals: Week 4: SLP Short Term Goal 1 (Week 4): STG=LTG due to remaining length of stay (pt is pending placement at SNF) SLP Short Term Goal 1 - Progress (Week 4): Progressing toward goal    New Short Term Goals: Week 5: SLP Short Term Goal 1 (Week 5): STG=LTG due to remaining length of stay (pt is pending placement at SNF)  Weekly Progress Updates: Pt has continues to make functional gains and is progressing toward all short and long term goals. He is currently awaiting placement at SNF. Pt is currently Min-Mod assist for basic to mildly complex familiar tasks due to cognitive impairments impacting his problem solving, attention, and most noteworthy his emergent and anticipatory awareness. Pt is consuming and upgraded regular texture diet with thin liquids and uses compensatory swallow strategies for oral clearance and minimize aspiration Mod I-Supervision. His meal supervision needs have been decreased to intermittent. He continues to present with mild-mod dysarthria and due to decreased awareness and self monitoring still requires Min A cues to implement strategies to increase his intelligibility in conversation. Pt and family education is ongoing. Pt would continue to benefit from skilled ST while inpatient in order to maximize functional independence and reduce burden of care prior to discharge. Anticipate that pt will need 24/7 supervision at discharge in addition to ST follow up at next level of care (SNF).       Intensity: Minumum of 1-2 x/day, 30 to 90 minutes Frequency: 3 to 5 out of 7 days Duration/Length of Stay: TBD; pending  SNF placement Treatment/Interventions: Cognitive remediation/compensation;Internal/external aids;Functional tasks;Patient/family education;Speech/Language facilitation;Therapeutic Activities;Dysphagia/aspiration precaution training;Cueing hierarchy   Daily Session  Skilled Therapeutic Interventions: Pt was seen for skilled ST targeting Cognitive goals. SLP facilitated session with a logic based error awareness worksheet in which pt had to identify logical mistakes within a calendar. He identified ~50% without cues, however Mod A verbal cues required for awareness of remaining errors. SLP further facilitated session with PEG board task in which pt had to recreate basic to mildly complex designs from photograph representation. He required Min A verbal and visual cues for problem solving basic designs, but Max A verbal and visual cues were required for both error awareness and problem solving with designs of slightly increased complexity. Pt also with increasing fatigue throughout session. Pt left laying in bed with alarm set and needs within reach. Continue per current plan of care.             Pain Pain Assessment Pain Scale: 0-10 Pain Score: 0-No pain  Therapy/Group: Individual Therapy  [name] 10/14/2020, 3:01 PM

## 2020-10-14 NOTE — Progress Notes (Signed)
[place] PHYSICAL MEDICINE & REHABILITATION PROGRESS NOTE   Subjective/Complaints: Patient seen laying in bed this morning.  He states he slept well overnight.  He states he took all of his medications without issue this morning.  ROS: Denies CP, SOB, N/V/D  Objective:   No results found. No results for input(s): WBC, HGB, HCT, PLT in the last 72 hours. No results for input(s): NA, K, CL, CO2, GLUCOSE, BUN, CREATININE, CALCIUM in the last 72 hours.  Intake/Output Summary (Last 24 hours) at 10/14/2020 1614 Last data filed at 10/14/2020 1100 Gross per 24 hour  Intake 917 ml  Output 300 ml  Net 617 ml        Physical Exam: Vital Signs Blood pressure 123/78, pulse 69, temperature 99.2 F (37.3 C), resp. rate 18, height 5\' 7"  (1.702 m), weight 78.4 kg, SpO2 100 %.  Constitutional: No distress . Vital signs reviewed. HENT: Normocephalic.  Atraumatic. Eyes: EOMI. No discharge. Cardiovascular: No JVD.  RRR. Respiratory: Normal effort.  No stridor.  Bilateral clear to auscultation. GI: Non-distended.  BS +. Skin: Warm and dry.  Intact. Psych: Normal mood.  Normal behavior. Musc: No edema in extremities.  No tenderness in extremities. Neuro: Alert Dysarthria, stable Follows simple commands.   Limited awareness of deficits  Left neglect, improving Left sided apraxia and ataxia, stable Motor: RUE: 4+/5 proximal distal, unchanged RLE: 4-4+/5 proximal distal, stable LUE: 4+/5 proximal distal, unchanged LLE: Hip flexion, knee extension 4/5, ankle dorsiflexion 3/5  Left upper extremity dysmetria and apraxia, improving  Assessment/Plan: 1. 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  Physiatrist is providing close team supervision and 24 hour management of active medical problems listed below.  Physiatrist and rehab team continue to assess barriers to  discharge/monitor patient progress toward functional and medical goals    Medical Problem List and Plan: 1.  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  Antithrombotics: -DVT/anticoagulation: SCDs             -antiplatelet therapy: N/A 3. Pain Management: Tylenol as needed for headaches.  Controlled with meds on 10/21 4. Mood: Advised emotional support             -antipsychotic agents: Seroquel 25 mg nightly DC'd 5. Neuropsych: This patient is is?  Fully capable of making decisions on his own behalf. 6. Skin/Wound Care: foam dressing, continue pressure relief 7. Fluids/Electrolytes/Nutrition: Routine in and outs.    BMP within normal limits on 10/18 8.  Post stroke dysphagia.      Advanced to regular thins, now with intermittent supervision  Overall good p.o. intake  Continue to advance diet as tolerated 9.  History of hypertension.    Lopressor 25 mg twice daily, decreased to 12.5 twice daily on 9/28, DC'd on 9/30  Lisinopril DC'd Vitals:   10/14/20 0408 10/14/20 1417  BP: 119/79 123/78  Pulse: (!) 56 69  Resp: 18 18  Temp: 98 F (36.7 C) 99.2 F (37.3 C)  SpO2: 100% 100%   Relatively controlled on 10/21 10.  Polysubstance abuse-alcohol, tobacco, cocaine abuse.  Monitor for any signs of withdrawal.    Supplement thiamine and folic acid  Counsel 11. Urinary retention/urinary incontinence:   Periods of incontinence incontinence  PVRs ordered  Appears to be improving 12.  Thrombocytosis: Resolved  Platelets 362 on  10/15 13.  Slow transit constipation  Bowel meds increased on 10/18  Improving  LOS: 29 days A FACE TO FACE EVALUATION WAS PERFORMED  Zachary Flores 10/14/2020, 4:14 PM

## 2020-10-14 NOTE — Progress Notes (Signed)
Occupational Therapy Session Note  Patient Details  Name: Zachary Flores MRN: [id number] Date of Birth: 12/20/1966  Today's Date: 10/14/2020 OT Individual Time: 1102-1200 OT Individual Time Calculation (min): 58 min    Short Term Goals: Week 4:  OT Short Term Goal 1 (Week 4): STGs=LTGs dt ELOS  Skilled Therapeutic Interventions/Progress Updates:    Pt sitting up in w/c, no c/o pain, reporting he washed up yesterday and would like to work on his LUE control.  Pt transported to ortho gym for time mgt.  Pt completed x 10 minutes level 2.5 resistance on UBE forward/backward.  Attempted independent grasp on left handle however, pt unable to maintain motor control to sustain grip therefore applied ace wrap.  Improved fluidity of movement noted LUE today.  Pt participated in seated theract to facilitate functional reach/grasp/throw using LUE with improved control and precision using horseshoe task. Pt completed dynavision scan and press left and right lower quadrant.  Pt exhibited score of 22 and average reaction time of 5.45 seconds using LUE.  This is much improved from last attempt, pt was unable to accurately press any at dynavision board last time. Pt transported to room and requesting back to bed.  Pt completed SPT w/c to EOB with min assist and max step by step VC's for correct hand and foot placement.  Soft call bell in reach, bed alarm on.    Therapy Documentation Precautions:  Precautions Precautions: Fall Precaution Comments: left side ataxia and weakness Restrictions Weight Bearing Restrictions: No   Therapy/Group: Individual Therapy  Amie Critchley 10/14/2020, 3:56 PM

## 2020-10-14 NOTE — Progress Notes (Addendum)
Patient ID: Zachary Flores, male   DOB: [birth year], 54 y.o.   MRN: [id number]  Spoke with [facility] who can not offer a bed for him due to his drug history. Will reach back out to [facility] [place] and see if any other resources this worker can pursue.  10:50 AM TOC informed [facility] may consider our LOG. Have reached out and left a message for [name], will await return call.  2:45 PM Spoke with bessie-sister to inform no bed offers and continuing to expand. Aware may run out of options and will need to come up with a plan for discharge home,but has no home to go too.

## 2020-10-14 NOTE — Progress Notes (Signed)
Physical Therapy Session Note  Patient Details  Name: Zachary Flores MRN: [id number] Date of Birth: 09/19/1966  Today's Date: 10/14/2020 PT Individual Time: 0814-4818 + 1445-1525 PT Individual Time Calculation (min): 39 min +  Short Term Goals: Week 4:  PT Short Term Goal 1 (Week 4): STG = LTG due to ELOS  Skilled Therapeutic Interventions/Progress Updates:  1st session:   Pt received supine in bed, agreeable to PT session, he denies any pain. Donned pants with modA while supine in bed. Supine<>sit with minA with HOB flat, using bed rail. Able to sit EOB with supervision. Stand<>pivot transfer with modA from EOB to manual w/c. Pt requesting to brush teeth, therefore wheeled sinkside where he brushed teeth using RUE with setupA. After completion, WC transport for energy conservation and time management from his room to main hallway outside therapy gym. With modA +2 with hand held assist, he ambulated 2x59ft + 16ft (seated rest breaks) on level surfaces. Pt with slightly improved gait abnormalities but still continues to show ataxic gait with decreased B foot clearance, wide BOS, and rigid trunk. No knee buckling noted. WC transport back to his room where he remained seated in w/c at end of session with needs in reach, safety belt alarm on.  2nd session Pt received supine in bed, agreeable to PT session, no c/o pain. Supine<>sit with minA using bed features, towards L EOB. Stand<>pivot with modA from EOB to w/c and WC transport from his room to dayroom gym for time management and energy conservation. Stand<>pivot with modA from w/c to mat table and then performed unsupported seated balance activities while including LUE coordination training. With CGA, performed cone reaching in multiple planes, grabbing cones, and placing cones on target. Then performed sit<>stand with minA from edge of mat table and required minA guard in standing to complete peg board puzzle, using RUE to manipulate pegs. He  was able to maintain standing for >10 minutes with minA guard while completing puzzle. Then in seated position, he was instructed to use LUE to remove peg's from puzzle, increased difficulty 2/2 LUE apraxia. Stand<>pivot with modA from mat table to w/c and WC transport back to his room for time management. Pt requesting to return to his bed, therefore, stand<>pivot again with modA with cues for L foot placement and technique from w/c to EOB. Sit>supine with minA using bed features. He remained semi-reclined in bed at end of session with needs in reach, bed alarm on.  Therapy Documentation Precautions:  Precautions Precautions: Fall Precaution Comments: left side ataxia and weakness Restrictions Weight Bearing Restrictions: No  Therapy/Group: Individual Therapy  Marthann Abshier P Cookie Pore PT 10/14/2020, 9:44 AM

## 2020-10-15 ENCOUNTER — Inpatient Hospital Stay (HOSPITAL_COMMUNITY): Payer: Self-pay | Admitting: Occupational Therapy

## 2020-10-15 ENCOUNTER — Inpatient Hospital Stay (HOSPITAL_COMMUNITY): Payer: Self-pay | Admitting: Speech Pathology

## 2020-10-15 ENCOUNTER — Inpatient Hospital Stay (HOSPITAL_COMMUNITY): Payer: Self-pay

## 2020-10-15 NOTE — Progress Notes (Addendum)
Patient ID: Zachary Flores, male   DOB: 05-Oct-1966, 54 y.o.   MRN: [id number]  Reached out again to [facility] and left another message regarding pt. Will call back if have not heard from her in 2 hours.  11:07 Am Another message left for [name]. Received call back from jasmine and will send information to see if they can offer pt a bed.  3:00 Pm No word from [facility]asmine, have left a message to confirm if received the information sent to her at 11:00.

## 2020-10-15 NOTE — Progress Notes (Signed)
[place] PHYSICAL MEDICINE & REHABILITATION PROGRESS NOTE   Subjective/Complaints: Patient seen laying in bed this morning.  He states he slept well overnight.  He denies complaints.  ROS: Denies CP, SOB, N/V/D  Objective:   No results found. No results for input(s): WBC, HGB, HCT, PLT in the last 72 hours. No results for input(s): NA, K, CL, CO2, GLUCOSE, BUN, CREATININE, CALCIUM in the last 72 hours.  Intake/Output Summary (Last 24 hours) at 10/15/2020 1345 Last data filed at 10/15/2020 1300 Gross per 24 hour  Intake 897 ml  Output 400 ml  Net 497 ml        Physical Exam: Vital Signs Blood pressure 134/88, pulse (!) 54, temperature 98.3 F (36.8 C), temperature source Oral, resp. rate 18, height 5\' 7"  (1.702 m), weight 78.4 kg, SpO2 95 %.  Constitutional: No distress . Vital signs reviewed. HENT: Normocephalic.  Atraumatic. Eyes: EOMI. No discharge. Cardiovascular: No JVD.  RRR. Respiratory: Normal effort.  No stridor.  Bilateral clear to auscultation. GI: Non-distended.  BS +. Skin: Warm and dry.  Intact. Psych: Normal mood.  Normal behavior. Musc: No edema in extremities.  No tenderness in extremities. Neuro: Alert Dysarthria, unchanged Follows simple commands.   Limited awareness of deficits  Left neglect, improving Left sided apraxia and ataxia, stable Motor: RUE: 4+/5 proximal distal, unchanged RLE: 4-4+/5 proximal distal, stable LUE: 4+/5 proximal distal, unchanged LLE: Hip flexion, knee extension 4/5, ankle dorsiflexion 3/5  Left upper extremity dysmetria and apraxia, slow improvement  Assessment/Plan: 1. 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  Physiatrist is providing close team supervision and 24 hour management of active medical problems listed below.  Physiatrist and rehab team continue to assess barriers to discharge/monitor  patient progress toward functional and medical goals    Medical Problem List and Plan: 1.  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  Antithrombotics: -DVT/anticoagulation: SCDs             -antiplatelet therapy: N/A 3. Pain Management: Tylenol as needed for headaches.  Controlled with meds on 10/22 4. Mood: Advised emotional support             -antipsychotic agents: Seroquel 25 mg nightly DC'd 5. Neuropsych: This patient is is?  Fully capable of making decisions on his own behalf. 6. Skin/Wound Care: foam dressing, continue pressure relief 7. Fluids/Electrolytes/Nutrition: Routine in and outs.    BMP within normal limits on 10/18 8.  Post stroke dysphagia.      Advanced to regular thins, now with intermittent supervision  Overall good p.o. intake  Continue to advance diet as tolerated 9.  History of hypertension.    Lopressor 25 mg twice daily, decreased to 12.5 twice daily on 9/28, DC'd on 9/30  Lisinopril DC'd Vitals:   10/14/20 1936 10/15/20 0425  BP: (!) 128/92 134/88  Pulse: 64 (!) 54  Resp: 18 18  Temp: 98.4 F (36.9 C) 98.3 F (36.8 C)  SpO2: 93% 95%   Borderline elevated diastolic pressures 10.  Polysubstance abuse-alcohol, tobacco, cocaine abuse.  Monitor for any signs of withdrawal.    Supplement thiamine and folic acid  Counsel 11. Urinary retention/urinary incontinence:   Periods of incontinence incontinence  PVRs ordered, performed x1 borderline normal  Continue to monitor 12.  Thrombocytosis: Resolved  Platelets 362 on  10/15  CBC ordered for Monday 13.  Slow transit constipation  Bowel meds increased on 10/18  Last BM on 10/21-hard, will consider further increase if necessary  LOS: 30 days A FACE TO FACE EVALUATION WAS PERFORMED  Zachary Flores 10/15/2020, 1:45 PM

## 2020-10-15 NOTE — Progress Notes (Signed)
Speech Language Pathology Daily Session Note  Patient Details  Name: Zachary Flores MRN: [id number] Date of Birth: [birth year]  Today's Date: 10/15/2020 SLP Individual Time: 0800-0900 SLP Individual Time Calculation (min): 60 min  Short Term Goals: Week 5: SLP Short Term Goal 1 (Week 5): STG=LTG due to remaining length of stay (pt is pending placement at SNF)  Skilled Therapeutic Interventions:  Pt was seen for skilled ST targeting cognitive goals.  Pt was received in bed, awake, alert, and pleasantly interactive.  He recalled meeting therapist in previous treatment sessions.  Pt reported needing to go to the bathroom and needed min-mod assist for opening brief and placing urinal.  Pt had already been incontinent of a small amount of urine in his brief so SLP assisted in hygiene and donning clean brief.  SLP facilitated the session with 2 novel games to address goals for problem solving and attention to detail.  During the first task, pt was able to generate word-picture associations and then recall them with supervision verbal cues.  Pt needed min verbal cues throughout generative naming portion of task for working memory of task rules and procedures and redirection to task given distraction to internal stimuli in the setting of delayed processing,  but was still able to recall named category members after a delay for 100% accuracy with x1 min assist verbal cue.  During the second game, pt needed mod faded to supervision verbal cues for attention to detail to identify a matching set of three cards from a field of nine.  Pt was left in bed with bed alarm set and call bell within reach.  Continue per current plan of care.     Pain Pain Assessment Pain Scale: 0-10 Pain Score: 0-No pain  Therapy/Group: Individual Therapy  Aleathea Pugmire, Melanee Spry 10/15/2020, 9:02 AM

## 2020-10-15 NOTE — Progress Notes (Signed)
Physical Therapy Session Note  Patient Details  Name: Zachary Flores MRN: [id number] Date of Birth: [birth year]  Today's Date: 10/15/2020 PT Individual Time: 1010-1048 PT Individual Time Calculation (min): 38 min   Short Term Goals: Week 4:  PT Short Term Goal 1 (Week 4): STG = LTG due to ELOS  Skilled Therapeutic Interventions/Progress Updates:    Pt received supine in bed, agreeable to PT session without c/o pain. Donned scrub pants with modA while supine in bed, unable to clear over buttock and required assist in standing to do this. Supine<>sit with supervision with use of bed features. Stand<>pivot with modA from EOB to w/c with cues for L Foot placement, general sequencing, and safety awareness. Pt requesting to brush teeth, therefore wheeled sinkside in w/c where he performed oral care with setupA using RUE. WC transport for time management from his room to main therapy gym. Required minA for sit<>stand to counter height where he was instructed to place red and green colored clothes pins on bar. He was able to do this with minA guard for balance and use RUE fairly well. After seated rest break, he repeated this task but was instructed to use LUE. He used RUE for grabbing the clothes pin and LUE pincher grasp to place clothes pin on bar. Increased difficulty with LUE 2/2 motor apraxia but showed good ability to maintain pincer grasp and use compensatory strategies to place pin. Remainder of session focused on w/c mobility. Used long handle shoe horn to assist with placing L shoe, R shoe donned with maxA. He self propelled himself using BLE for propelling ~60ft, requiring minA for maintaining straight path and for avoiding obstacles in the hallway. WC transport with totalA for time management back to his room. Stand<>pivot with modA from w/c to EOB, sit>supine with supervision with use of bed features. He ended session semi-reclined in bed with needs in reach, bed alarm on.  Therapy  Documentation Precautions:  Precautions Precautions: Fall Precaution Comments: left side ataxia and weakness Restrictions Weight Bearing Restrictions: No Therapy/Group: Individual Therapy  Virgen Belland P Jelina Paulsen PT 10/15/2020, 8:00 AM

## 2020-10-15 NOTE — Progress Notes (Signed)
Occupational Therapy Session Note  Patient Details  Name: Zachary Flores MRN: [id number] Date of Birth: 1966/07/06  Today's Date: 10/15/2020 OT Individual Time: 3419-6222 OT Individual Time Calculation (min): 74 min    Short Term Goals: Week 4:  OT Short Term Goal 1 (Week 4): STGs=LTGs dt ELOS  Skilled Therapeutic Interventions/Progress Updates:    Pt supine in bed, no c/o pain, requesting to shave.  Supine to sit with CGA and VCs for body mechanics using bedrail.  SPT EOB to w/c with min assist and VCs for body mechanics and hand/foot placement.  Pt transported sinkside.  Pt needing min assist to shave with increased time.  Pt doffed shirt independently and bathed UB with supervision.  Pt donned shirt with setup.  Pt bathed LB with CGA and max simple VCs for body mechanics during sit<>stand.  Pt needed mod assist to donn brief and pants and min assist to donn/doff socks.  Pt requesting to use bathroom.  SPT w/c<> 3 in 1commode using grab bar with min assist.  Pt had continent episode of bowel with close supervision for sitting balance.  Pt completed toileting with min assist to pull pants and brief over hips with pt standing at CGA level.  SPT w/c to EOB with min assist and sit to supine with supervision.  Call bell in reach, bed alarm on.   Therapy Documentation Precautions:  Precautions Precautions: Fall Precaution Comments: left side ataxia and weakness Restrictions Weight Bearing Restrictions: No   Therapy/Group: Individual Therapy  Amie Critchley 10/15/2020, 3:50 PM

## 2020-10-16 ENCOUNTER — Inpatient Hospital Stay (HOSPITAL_COMMUNITY): Payer: Self-pay | Admitting: Physical Therapy

## 2020-10-16 MED ORDER — POLYETHYLENE GLYCOL 3350 17 G PO PACK
17.0000 g | PACK | Freq: Two times a day (BID) | ORAL | Status: DC
  Administered 2020-10-16 – 2020-10-28 (×21): 17 g via ORAL
  Filled 2020-10-16 (×23): qty 1

## 2020-10-16 NOTE — Plan of Care (Signed)
  Problem: RH BOWEL ELIMINATION Goal: RH STG MANAGE BOWEL WITH ASSISTANCE Description: STG Manage Bowel with Assistance. Outcome: Progressing   Problem: RH SAFETY Goal: RH STG ADHERE TO SAFETY PRECAUTIONS W/ASSISTANCE/DEVICE Description: STG Adhere to Safety Precautions With Assistance/Device. Patient will adhere to safety plan and will not have any fall this admission. Outcome: Progressing

## 2020-10-16 NOTE — Progress Notes (Signed)
[place] PHYSICAL MEDICINE & REHABILITATION PROGRESS NOTE   Subjective/Complaints: Patient seen sitting up in bed this morning.  He states he slept well overnight.   ROS: Denies CP, SOB, N/V/D  Objective:   No results found. No results for input(s): WBC, HGB, HCT, PLT in the last 72 hours. No results for input(s): NA, K, CL, CO2, GLUCOSE, BUN, CREATININE, CALCIUM in the last 72 hours.  Intake/Output Summary (Last 24 hours) at 10/16/2020 1232 Last data filed at 10/16/2020 0800 Gross per 24 hour  Intake 660 ml  Output --  Net 660 ml        Physical Exam: Vital Signs Blood pressure 126/79, pulse 61, temperature 98.1 F (36.7 C), temperature source Oral, resp. rate 20, height 5\' 7"  (1.702 m), weight 78.4 kg, SpO2 97 %.  Constitutional: No distress . Vital signs reviewed. HENT: Normocephalic.  Atraumatic. Eyes: EOMI. No discharge. Cardiovascular: No JVD.  RRR. Respiratory: Normal effort.  No stridor.  Bilateral clear to auscultation. GI: Non-distended.  BS +. Skin: Warm and dry.  Intact. Psych: Normal mood.  Normal behavior. Musc: No edema in extremities.  No tenderness in extremities. Neuro: Alert Dysarthria, stable Follows simple commands.   Limited awareness of deficits  Left neglect, improving Left sided apraxia and ataxia, stable Motor: RUE: 4+/5 proximal distal, unchanged RLE: 4-4+/5 proximal distal, stable LUE: 4+/5 proximal distal, stable LLE: Hip flexion, knee extension 4/5, ankle dorsiflexion 3/5  Left upper extremity dysmetria and apraxia, slow improvement  Assessment/Plan: 1. 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  Physiatrist is providing close team supervision and 24 hour management of active medical problems listed below.  Physiatrist and rehab team continue to assess barriers to discharge/monitor patient progress toward functional and  medical goals    Medical Problem List and Plan: 1.  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  Antithrombotics: -DVT/anticoagulation: SCDs             -antiplatelet therapy: N/A 3. Pain Management: Tylenol as needed for headaches.  Controlled with meds on 10/23 4. Mood: Advised emotional support             -antipsychotic agents: Seroquel 25 mg nightly DC'd 5. Neuropsych: This patient is is?  Fully capable of making decisions on his own behalf. 6. Skin/Wound Care: foam dressing, continue pressure relief 7. Fluids/Electrolytes/Nutrition: Routine in and outs.    BMP within normal limits on 10/18 8.  Post stroke dysphagia.      Advanced to regular thins, now with intermittent supervision  Overall good p.o. intake  Continue to advance diet as tolerated 9.  History of hypertension.    Lopressor 25 mg twice daily, decreased to 12.5 twice daily on 9/28, DC'd on 9/30  Lisinopril DC'd Vitals:   10/15/20 2008 10/16/20 0529  BP: 111/82 126/79  Pulse: 66 61  Resp: 20   Temp: 98.8 F (37.1 C) 98.1 F (36.7 C)  SpO2: 100% 97%   Controlled on 10/23 10.  Polysubstance abuse-alcohol, tobacco, cocaine abuse.  Monitor for any signs of withdrawal.    Supplement thiamine and folic acid  Counsel 11. Urinary retention/urinary incontinence:   Periods of incontinence incontinence  PVRs borderline  Will avoid medications at present given blood pressure  Continue to monitor 12.  Thrombocytosis: Resolved  Platelets 362 on 10/15  CBC ordered for Monday 13.  Slow transit constipation  Bowel meds increased on 10/18, increased again on 10/23  Last BM on 10/22 -hard, will consider further increase if necessary  LOS: 31 days A FACE TO FACE EVALUATION WAS PERFORMED  Nickole Adamek [name] 10/16/2020, 12:32 PM

## 2020-10-16 NOTE — Progress Notes (Signed)
Physical Therapy Session Note  Patient Details  Name: Zachary Flores MRN: [id number] Date of Birth: July 09, 1966  Today's Date: 10/16/2020 PT Individual Time: 0910-1022 PT Individual Time Calculation (min): 72 min   Short Term Goals: Week 4:  PT Short Term Goal 1 (Week 4): STG = LTG due to ELOS  Skilled Therapeutic Interventions/Progress Updates:    Pt received supine in bed and agreeable to therapy session. Requests for assist to put on clean clothes. Supine>sitting R EOB, HOB elevated ~30degrees, and relying heavily on R UE support via bedrail for trunk upright with CGA and significantly increased time and effort to get to EOB - pt demos poor scooting technique with minimal excursion of movement and lack of weight shifting. Sitting EOB with close supervision for sitting balance (intermittent posterior lean with pt able to recover using R UE support on bedrail) donned shirt and pants with min assist and significantly increased time and effort, cuing for increased L UE incorporation into task with significant apraxia with impaired sensation noted - max assist to don shoes for time management. R stand pivot with min assist for lifting into standing and min/mod assist for balance while turning. Sitting in w/c at sink washed face and performed oral care with set-up assist and significantly increased time with cuing for use of L UE. Attempted B LE w/c propulsion ~63ft with mod assist for turning and forward propulsion as pt having difficulty feeling when his feet touch the floor in order to push down and gain traction to pull himself forward despite cuing. Transported remainder of distance to therapy gym. Standing with B UE support on litegait donned harness. Overground gait training in litegait harness ~175ft x2 with +2 assist for litegait management and therapist facilitating R/L weight shift onto stance limb - noted most prominent impairement is poor motor planing to weight shift onto stance limb in order  to perform swing phase of opposite LE along with absent proprioceptive awareness of where LEs are when stepping with more impaired L LE with excessive abduction causing wide BOS - taped yard stick to litegait handle to provide visual target to increase LLE step length and adduction with minimal improvement but inconsistent - during 2nd trial attempted 5lb weights on B LEs but made pt more shuffled/freezing episodes - L UE intermittently loosing grasp on litegait requiring facilitation to maintain. Doffed litegait harness as described above. Stand pivot w/c<>EOM with min/mod assist for balance, no AD. Attempted L LE foot taps on/off step with minimal R UE support but pt with increased fear of falling resulting in worse motor planning and increased instability - transitioned to just L LE forward/backwards stepping to external targeting focusing on weight shifting and stepping - mod assist for balance. Transported back to room and left seated in w/c with needs in reach and seat belt alarm on.  Therapy Documentation Precautions:  Precautions Precautions: Fall Precaution Comments: left side ataxia and weakness Restrictions Weight Bearing Restrictions: No  Pain:   No reports of pain throughout session.   Therapy/Group: Individual Therapy  Ginny Forth , PT, DPT, CSRS  10/16/2020, 7:50 AM

## 2020-10-17 MED ORDER — SENNOSIDES-DOCUSATE SODIUM 8.6-50 MG PO TABS
2.0000 | ORAL_TABLET | Freq: Two times a day (BID) | ORAL | Status: DC
  Administered 2020-10-17 – 2020-10-28 (×23): 2 via ORAL
  Filled 2020-10-17 (×23): qty 2

## 2020-10-17 NOTE — Plan of Care (Signed)
°  Problem: RH BOWEL ELIMINATION Goal: RH STG MANAGE BOWEL WITH ASSISTANCE Description: STG Manage Bowel with Assistance. Outcome: Progressing   Problem: RH SKIN INTEGRITY Goal: RH STG SKIN FREE OF INFECTION/BREAKDOWN Description: Patients skin will be free of infection and break down during this admission. Outcome: Progressing

## 2020-10-17 NOTE — Progress Notes (Signed)
[place] PHYSICAL MEDICINE & REHABILITATION PROGRESS NOTE   Subjective/Complaints: Patient seen sitting up in bed this morning.  He states he slept well overnight.  He is looking forward to a day of rest.  ROS: Denies CP, SOB, N/V/D  Objective:   No results found. No results for input(s): WBC, HGB, HCT, PLT in the last 72 hours. No results for input(s): NA, K, CL, CO2, GLUCOSE, BUN, CREATININE, CALCIUM in the last 72 hours.  Intake/Output Summary (Last 24 hours) at 10/17/2020 1215 Last data filed at 10/17/2020 0752 Gross per 24 hour  Intake 957 ml  Output 500 ml  Net 457 ml        Physical Exam: Vital Signs Blood pressure (!) 143/92, pulse (!) 57, temperature (!) 97.5 F (36.4 C), temperature source Oral, resp. rate 19, height 5\' 7"  (1.702 m), weight 78.4 kg, SpO2 99 %.  Constitutional: No distress . Vital signs reviewed. HENT: Normocephalic.  Atraumatic. Eyes: EOMI. No discharge. Cardiovascular: No JVD.  RRR. Respiratory: Normal effort.  No stridor.  Bilateral clear to auscultation. GI: Non-distended.  BS +. Skin: Warm and dry.  Intact. Psych: Normal mood.  Normal behavior. Musc: No edema in extremities.  No tenderness in extremities. Neuro: Alert Dysarthria, unchanged Follows simple commands.   Limited awareness of deficits  Left neglect, improving Left sided apraxia and ataxia, stable Motor: RUE: 4+/5 proximal distal, stable RLE: 4-4+/5 proximal distal, stable LUE: 4+/5 proximal distal, stable LLE: Hip flexion, knee extension 4/5, ankle dorsiflexion 3/5  Left upper extremity dysmetria and apraxia, slow improvement  Assessment/Plan: 1. 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  Physiatrist is providing close team supervision and 24 hour management of active medical problems listed below.  Physiatrist and rehab team continue to assess barriers to  discharge/monitor patient progress toward functional and medical goals    Medical Problem List and Plan: 1.  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  Antithrombotics: -DVT/anticoagulation: SCDs             -antiplatelet therapy: N/A 3. Pain Management: Tylenol as needed for headaches.  Controlled with meds on 10/24 4. Mood: Advised emotional support             -antipsychotic agents: Seroquel 25 mg nightly DC'd 5. Neuropsych: This patient is is?  Fully capable of making decisions on his own behalf. 6. Skin/Wound Care: foam dressing, continue pressure relief 7. Fluids/Electrolytes/Nutrition: Routine in and outs.    BMP within normal limits on 10/18 8.  Post stroke dysphagia.      Advanced to regular thins, now with intermittent supervision  Overall good p.o. intake  Continue to advance diet as tolerated 9.  History of hypertension.    Lopressor 25 mg twice daily, decreased to 12.5 twice daily on 9/28, DC'd on 9/30  Lisinopril DC'd Vitals:   10/16/20 1947 10/17/20 0336  BP: 127/83 (!) 143/92  Pulse: 66 (!) 57  Resp: 18 19  Temp: 98.7 F (37.1 C) (!) 97.5 F (36.4 C)  SpO2: 97% 99%   Relatively controlled on 10/24 10.  Polysubstance abuse-alcohol, tobacco, cocaine abuse.  Monitor for any signs of withdrawal.    Supplement thiamine and folic acid  Counsel 11. Urinary retention/urinary incontinence:   Periods of incontinence incontinence  PVRs borderline  Will avoid medications at present  given blood pressure  Continue to monitor 12.  Thrombocytosis: Resolved  Platelets 362 on 10/15  CBC ordered for tomorrow 13.  Slow transit constipation  Bowel meds increased on 10/18, increased again on 10/23, increased again on 10/24  LOS: 32 days A FACE TO FACE EVALUATION WAS PERFORMED  Aliesha Dolata [name] 10/17/2020, 12:15 PM

## 2020-10-18 ENCOUNTER — Inpatient Hospital Stay (HOSPITAL_COMMUNITY): Payer: Self-pay | Admitting: Speech Pathology

## 2020-10-18 ENCOUNTER — Inpatient Hospital Stay (HOSPITAL_COMMUNITY): Payer: Self-pay | Admitting: Occupational Therapy

## 2020-10-18 ENCOUNTER — Inpatient Hospital Stay (HOSPITAL_COMMUNITY): Payer: Self-pay

## 2020-10-18 NOTE — Progress Notes (Addendum)
Patient ID: Zachary Flores, male   DOB: 07-01-1966, 54 y.o.   MRN: [id number]  Spoke with [name] who reports received the information on pt but they are currently on outbreak status due to COVID. She will keep his paperwork if something changes.   2:15 PM Reached out to [employer] [facility] and have asked her to look at his information regarding possibility of bed offer. She will get back with this worker.

## 2020-10-18 NOTE — Plan of Care (Signed)
  Problem: RH Functional Use of Upper Extremity Goal: LTG Patient will use RT/LT upper extremity as a (OT) Description: LTG: Patient will use right/left upper extremity as a stabilizer/gross assist/diminished/nondominant/dominant level with assist, with/without cues during functional activity (OT) Outcome: Completed/Met Flowsheets (Taken 10/18/2020 1609) LTG: Use of upper extremity in functional activities:  RUE as dominant level  LUE as gross assist level LTG: Pt will use upper extremity in functional activity with assistance level of: Supervision/Verbal cueing   Problem: RH Balance Goal: LTG: Patient will maintain dynamic sitting balance (OT) Description: LTG:  Patient will maintain dynamic sitting balance with assistance during activities of daily living (OT) Flowsheets (Taken 10/18/2020 1609) LTG: Pt will maintain dynamic sitting balance during ADLs with: Supervision/Verbal cueing   Problem: RH Eating Goal: LTG Patient will perform eating w/assist, cues/equip (OT) Description: LTG: Patient will perform eating with assist, with/without cues using equipment (OT) Flowsheets (Taken 10/18/2020 1609) LTG: Pt will perform eating with assistance level of: Supervision/Verbal cueing   Problem: RH Grooming Goal: LTG Patient will perform grooming w/assist,cues/equip (OT) Description: LTG: Patient will perform grooming with assist, with/without cues using equipment (OT) Flowsheets (Taken 10/18/2020 1609) LTG: Pt will perform grooming with assistance level of: Set up assist    Problem: RH Bathing Goal: LTG Patient will bathe all body parts with assist levels (OT) Description: LTG: Patient will bathe all body parts with assist levels (OT) Flowsheets (Taken 10/18/2020 1609) LTG: Pt will perform bathing with assistance level/cueing: Contact Guard/Touching assist LTG: Position pt will perform bathing: Shower   Problem: RH Dressing Goal: LTG Patient will perform upper body dressing  (OT) Description: LTG Patient will perform upper body dressing with assist, with/without cues (OT). Flowsheets (Taken 10/18/2020 1609) LTG: Pt will perform upper body dressing with assistance level of: Set up assist Goal: LTG Patient will perform lower body dressing w/assist (OT) Description: LTG: Patient will perform lower body dressing with assist, with/without cues in positioning using equipment (OT) Flowsheets (Taken 10/18/2020 1609) LTG: Pt will perform lower body dressing with assistance level of: Minimal Assistance - Patient > 75%   Problem: RH Toileting Goal: LTG Patient will perform toileting task (3/3 steps) with assistance level (OT) Description: LTG: Patient will perform toileting task (3/3 steps) with assistance level (OT)  Flowsheets (Taken 10/18/2020 1609) LTG: Pt will perform toileting task (3/3 steps) with assistance level: Minimal Assistance - Patient > 75%   Problem: RH Toilet Transfers Goal: LTG Patient will perform toilet transfers w/assist (OT) Description: LTG: Patient will perform toilet transfers with assist, with/without cues using equipment (OT) Flowsheets (Taken 10/18/2020 1609) LTG: Pt will perform toilet transfers with assistance level of: Contact Guard/Touching assist   Problem: RH Tub/Shower Transfers Goal: LTG Patient will perform tub/shower transfers w/assist (OT) Description: LTG: Patient will perform tub/shower transfers with assist, with/without cues using equipment (OT) Flowsheets (Taken 10/18/2020 1609) LTG: Pt will perform tub/shower stall transfers with assistance level of: Contact Guard/Touching assist

## 2020-10-18 NOTE — Progress Notes (Signed)
Speech Language Pathology Daily Session Note  Patient Details  Name: Zachary Flores MRN: [id number] Date of Birth: Aug 12, 1966  Today's Date: 10/18/2020 SLP Individual Time: 1131-1157 SLP Individual Time Calculation (min): 26 min  Short Term Goals: Week 5: SLP Short Term Goal 1 (Week 5): STG=LTG due to remaining length of stay (pt is pending placement at SNF)  Skilled Therapeutic Interventions: Pt was seen for skilled ST targeting cognitive goals. SLP facilitate session with a basic card task, which he selectively attended to with Min A verbal cues for redirection. Initially only Min A verbal cues required for accurate problem solving basic calculations. However, pt became more fatigued throughout session and required increased Mod A cueing for problem solving in that state. Pt left sitting in wheelchair with alarm set and needs within reach. Continue per current plan of care.          Pain Pain Assessment Pain Scale: 0-10 Pain Score: 0-No pain  Therapy/Group: Individual Therapy  [name] 10/18/2020, 12:12 PM

## 2020-10-18 NOTE — Progress Notes (Signed)
Occupational Therapy Weekly Progress Note  Patient Details  Name: Zachary Flores MRN: [id number] Date of Birth: 09-23-1966  Beginning of progress report period: October 10, 2020 End of progress report period: October 18, 2020  Today's Date: 10/18/2020 OT Individual Time: 1425-1535 OT Individual Time Calculation (min): 70 min    Pts STGs= LTGs due to ELOS.  Pt has met 9 out of 10 LTGs therefore upgraded per below.  Pt is making steady progress and is consistently motivated to participate with skilled OT.  Pt demonstrates improved motor control both gross and fine, in LUE, however moderate ataxia still present.  Pt exhibits improved safety awareness requiring less VCs from max to min/mod.  Pt also exhibits improved visual tracking and occular motor skills.  Improved self care independence therefore noted needing only supervision to setup for UB bathing and dressing and min assist for LB bathing and dressing.  Pt still requires mod assist for toileting, however this is improved from max assist.  Pt will still need 24 hour supervision and physical assist at dc and completes most ADLs at w/c, TTB level due to ataxia in LUE and LLE.    Patient continues to demonstrate the following deficits: muscle weakness, impaired timing and sequencing, ataxia, decreased coordination and decreased motor planning, decreased attention, decreased awareness, decreased problem solving, decreased safety awareness and decreased memory and decreased sitting balance, decreased standing balance and decreased balance strategies and therefore will continue to benefit from skilled OT intervention to enhance overall performance with BADL.  Patient progressing toward long term goals..  Plan of care revisions: See below for upgraded goals:.  OT Short Term Goals Week 5:  OT Short Term Goal 1 (Week 5): STGs= LTGs due to ELOS    Problem: RH Functional Use of Upper Extremity Goal: LTG Patient will use RT/LT upper extremity as a  (OT) Description: LTG: Patient will use right/left upper extremity as a stabilizer/gross assist/diminished/nondominant/dominant level with assist, with/without cues during functional activity (OT) Outcome: Completed/Met Flowsheets (Taken 10/18/2020 1609) LTG: Use of upper extremity in functional activities:  RUE as dominant level  LUE as gross assist level LTG: Pt will use upper extremity in functional activity with assistance level of: Supervision/Verbal cueing   Problem: RH Balance Goal: LTG: Patient will maintain dynamic sitting balance (OT) Description: LTG:  Patient will maintain dynamic sitting balance with assistance during activities of daily living (OT) Flowsheets (Taken 10/18/2020 1609) LTG: Pt will maintain dynamic sitting balance during ADLs with: Supervision/Verbal cueing   Problem: RH Eating Goal: LTG Patient will perform eating w/assist, cues/equip (OT) Description: LTG: Patient will perform eating with assist, with/without cues using equipment (OT) Flowsheets (Taken 10/18/2020 1609) LTG: Pt will perform eating with assistance level of: Supervision/Verbal cueing   Problem: RH Grooming Goal: LTG Patient will perform grooming w/assist,cues/equip (OT) Description: LTG: Patient will perform grooming with assist, with/without cues using equipment (OT) Flowsheets (Taken 10/18/2020 1609) LTG: Pt will perform grooming with assistance level of: Set up assist    Problem: RH Bathing Goal: LTG Patient will bathe all body parts with assist levels (OT) Description: LTG: Patient will bathe all body parts with assist levels (OT) Flowsheets (Taken 10/18/2020 1609) LTG: Pt will perform bathing with assistance level/cueing: Contact Guard/Touching assist LTG: Position pt will perform bathing: Shower   Problem: RH Dressing Goal: LTG Patient will perform upper body dressing (OT) Description: LTG Patient will perform upper body dressing with assist, with/without cues (OT). Flowsheets  (Taken 10/18/2020 1609) LTG: Pt will perform upper body  dressing with assistance level of: Set up assist Goal: LTG Patient will perform lower body dressing w/assist (OT) Description: LTG: Patient will perform lower body dressing with assist, with/without cues in positioning using equipment (OT) Flowsheets (Taken 10/18/2020 1609) LTG: Pt will perform lower body dressing with assistance level of: Minimal Assistance - Patient > 75%   Problem: RH Toileting Goal: LTG Patient will perform toileting task (3/3 steps) with assistance level (OT) Description: LTG: Patient will perform toileting task (3/3 steps) with assistance level (OT)  Flowsheets (Taken 10/18/2020 1609) LTG: Pt will perform toileting task (3/3 steps) with assistance level: Minimal Assistance - Patient > 75%   Problem: RH Toilet Transfers Goal: LTG Patient will perform toilet transfers w/assist (OT) Description: LTG: Patient will perform toilet transfers with assist, with/without cues using equipment (OT) Flowsheets (Taken 10/18/2020 1609) LTG: Pt will perform toilet transfers with assistance level of: Contact Guard/Touching assist   Problem: RH Tub/Shower Transfers Goal: LTG Patient will perform tub/shower transfers w/assist (OT) Description: LTG: Patient will perform tub/shower transfers with assist, with/without cues using equipment (OT) Flowsheets (Taken 10/18/2020 1609) LTG: Pt will perform tub/shower stall transfers with assistance level of: Contact Guard/Touching assist  Skilled Therapeutic Interventions/Progress Updates:    Pt supine in bed, no c/o pain, agreeable to OT session.  Pt completed supine to sit with supervision, stand pivot transfer EOB to w/c with min assist.  Pt transported dependently to bathroom where he doffed shirt with mod I, pants and brief with min assist, and socks with min assist.  Pt completed stand pivot transfer w/c to TTB using grab bar with min assist and VCs needed to prevent premature sitting.   Pt bathed UB with supervision using long handled sponge.  Pt bathed LB needing min assist for buttocks in standing due to BUE needed to maintain balance.  Pt completed stand pivot transfer TTB to w/c with min assist and transported to sinkside.  Pt donned shirt overhead with setup.  Pt donned brief with min assist, requesting to defer donning of pants at this time.  Pt shaved sitting sinkside with setup and supervision.  Pt completed stand pivot transfer w/c to EOB with min assist and min VCs for body mechanics.  Sit to supine completed with supervision. Call bell in reach, bed alarm on.   Therapy Documentation Precautions:  Precautions Precautions: Fall Precaution Comments: left side ataxia and weakness Restrictions Weight Bearing Restrictions: No   Therapy/Group: Individual Therapy  Ezekiel Slocumb 10/18/2020, 3:58 PM

## 2020-10-18 NOTE — Progress Notes (Signed)
Physical Therapy Session Note  Patient Details  Name: Zachary Flores MRN: [id number] Date of Birth: 10/18/1966  Today's Date: 10/18/2020 PT Individual Time: 0945-1100 PT Individual Time Calculation (min): 75 min   Short Term Goals: Week 4:  PT Short Term Goal 1 (Week 4): STG = LTG due to ELOS  Skilled Therapeutic Interventions/Progress Updates:    Pt received supine in bed, agreeable to PT session without c/o pain. Provided pants which he was able to don with minA while supine in bed, increased difficulty with RLE threading into pants leg and requiring minA for this. Supine<>Sit with supervision with HOB flat, heavy reliance of RUE to bed rail to assist. Able to sit EOB with close supervision, stand<>pivot from EOB to manual w/c requiring minA for standing and min/modA for balance during transfer. Wheeled sinkside where he was able to perform oral hygiene with setupA, primarily using RUE for completion. WC transport for time management and energy conservation from his room to main therapy gym. Stand<>Pivot with min/modA from w/c to mat table with cues for safety awareness and sequencing. While unsupported sitting at EOM, performed BLE toe taps to target on floor, working on BLE coordination. He completed this with CGA for sitting balance, noted increased difficulty with hamstring/hip flexion facilitation as he would often drag his foot backwards rather than stepping backwards. Repeated this activity in standing, requiring modA for standing balance and significantly increased difficulty with LLE>RLE but similar deficits noted from seated position. Pt also expressing increased fear of falling during standing activities, despite reassurance. Pt then performed sit<>stands with task overlay for hanging horseshoe on basketball rim using LUE for coordination training while also focusing on dynamic standing balance. He required brief seated rest breaks for completing these as well as minA for sit<>stand  tranfsfers and minA for standing balance. Then, while maintaining standing with minA guard, he was instructed to place clothes pins on bar using LUE with pincer grasp technique. He was able to complete this but required extra time for LUE coordination, often becoming frustrated with self due to difficulty. Stand<>Pivot with min/modA from mat table back to his w/c and w/c transport back to his room for time management. He remained seated in w/c with safety belt alarm on, needs in reach at end of session.  Therapy Documentation Precautions:  Precautions Precautions: Fall Precaution Comments: left side ataxia and weakness Restrictions Weight Bearing Restrictions: No  Therapy/Group: Individual Therapy  Cicilia Clinger P [name] PT 10/18/2020, 10:25 AM

## 2020-10-18 NOTE — Progress Notes (Signed)
[place] PHYSICAL MEDICINE & REHABILITATION PROGRESS NOTE   Subjective/Complaints: No complaints this morning. Denies pain, constipation, insomnia.  Continues to have left arm and leg numbness, feels that this is improving.   ROS: Denies CP, SOB, N/V/D  Objective:   No results found. No results for input(s): WBC, HGB, HCT, PLT in the last 72 hours. No results for input(s): NA, K, CL, CO2, GLUCOSE, BUN, CREATININE, CALCIUM in the last 72 hours.  Intake/Output Summary (Last 24 hours) at 10/18/2020 0943 Last data filed at 10/18/2020 0900 Gross per 24 hour  Intake 674 ml  Output 850 ml  Net -176 ml        Physical Exam: Vital Signs Blood pressure (!) 144/92, pulse (!) 54, temperature 98 F (36.7 C), temperature source Oral, resp. rate 17, height 5\' 7"  (1.702 m), weight 78.4 kg, SpO2 100 %.   General: Alert, No apparent distress HEENT: Head is normocephalic, atraumatic, PERRLA, EOMI, sclera anicteric, oral mucosa pink and moist, dentition intact, ext ear canals clear,  Neck: Supple without JVD or lymphadenopathy Heart: Reg rate and rhythm. No murmurs rubs or gallops Chest: CTA bilaterally without wheezes, rales, or rhonchi; no distress Abdomen: Soft, non-tender, non-distended, bowel sounds positive. Extremities: No clubbing, cyanosis, or edema. Pulses are 2+ Skin: Clean and intact without signs of breakdown Neuro:  Dysarthria, unchanged Follows simple commands.   Limited awareness of deficits  Left neglect, improving Left sided apraxia and ataxia, stable Motor: RUE: 4+/5 proximal distal, stable RLE: 4-4+/5 proximal distal, stable LUE: 4+/5 proximal distal, stable LLE: Hip flexion, knee extension 4/5, ankle dorsiflexion 3/5  Left upper extremity dysmetria and apraxia, slow improvement  Assessment/Plan: 1. 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  Physiatrist is providing close team supervision and 24 hour management of active medical problems listed below.  Physiatrist and rehab team continue to assess barriers to discharge/monitor patient progress toward functional and medical goals    Medical Problem List and Plan: 1.  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  Antithrombotics: -DVT/anticoagulation: SCDs             -antiplatelet therapy: N/A 3. Pain Management: Tylenol as needed for headaches.  Controlled with meds 10/25 4. Mood: Advised emotional support             -antipsychotic agents: Seroquel 25 mg nightly DC'd 5. Neuropsych: This patient is is?  Fully capable of making decisions on his own behalf. 6. Skin/Wound Care: foam dressing, continue pressure relief. 7. Fluids/Electrolytes/Nutrition: Routine in and outs.    BMP within normal limits on 10/18 8.  Post stroke dysphagia.      Advanced to regular thins, now with intermittent supervision  Overall good p.o. intake  Continue to advance diet as tolerated 9.  History of hypertension.    Lopressor 25 mg twice daily, decreased to 12.5 twice daily on 9/28, DC'd on 9/30  Lisinopril DC'd Vitals:   10/17/20 2033 10/18/20 0341  BP: 111/84 (!) 144/92  Pulse: 60 (!) 54  Resp: 18 17  Temp: 98.5 F (36.9 C) 98 F (36.7 C)  SpO2: 95% 100%   Well controlled 10/25 10.  Polysubstance abuse-alcohol, tobacco, cocaine abuse.  Monitor for any signs of withdrawal.    Supplement thiamine and folic acid  Counsel 11. Urinary retention/urinary incontinence:  Periods of incontinence incontinence  PVRs borderline  Will avoid medications at present given blood pressure  Continue to monitor 12.  Thrombocytosis: Resolved  Platelets 362 on 10/15  CBC 10/25 has not yet resulted 13.  Slow transit constipation  Bowel meds increased on 10/18, increased again  on 10/23, increased again on 10/24  LOS: 33 days A FACE TO FACE EVALUATION WAS PERFORMED  Drema Pry Tyrice Hewitt 10/18/2020, 9:43 AM

## 2020-10-19 ENCOUNTER — Inpatient Hospital Stay (HOSPITAL_COMMUNITY): Payer: Self-pay | Admitting: Speech Pathology

## 2020-10-19 ENCOUNTER — Inpatient Hospital Stay (HOSPITAL_COMMUNITY): Payer: Self-pay

## 2020-10-19 ENCOUNTER — Inpatient Hospital Stay (HOSPITAL_COMMUNITY): Payer: Self-pay | Admitting: Occupational Therapy

## 2020-10-19 NOTE — Plan of Care (Signed)
  Problem: RH Balance Goal: LTG Patient will maintain dynamic standing balance (PT) Description: LTG:  Patient will maintain dynamic standing balance with assistance during mobility activities (PT) Flowsheets (Taken 10/19/2020 1250) LTG: Pt will maintain dynamic standing balance during mobility activities with:: Contact Guard/Touching assist   Problem: RH Wheelchair Mobility Goal: LTG Patient will propel w/c in controlled environment (PT) Description: LTG: Patient will propel wheelchair in controlled environment, # of feet with assist (PT) Flowsheets (Taken 10/19/2020 1250) LTG: Pt will propel w/c in controlled environ  assist needed:: Supervision/Verbal cueing Goal: LTG Patient will propel w/c in home environment (PT) Description: LTG: Patient will propel wheelchair in home environment, # of feet with assistance (PT). Flowsheets (Taken 10/19/2020 1250) LTG: Pt will propel w/c in home environ  assist needed:: Supervision/Verbal cueing

## 2020-10-19 NOTE — Progress Notes (Signed)
Occupational Therapy Session Note  Patient Details  Name: Zachary Flores MRN: [id number] Date of Birth: 28-Feb-1966  Today's Date: 10/19/2020 OT Individual Time: 1105-1205 OT Individual Time Calculation (min): 60 min    Short Term Goals: Week 5:  OT Short Term Goal 1 (Week 5): STGs= LTGs due to ELOS  Skilled Therapeutic Interventions/Progress Updates:    Pt sitting up in w/c, no c/o pain, reports he would like to defer bathing and has already gotten dressed today.  Pt transported kitchen in ADL suite and participated in standing functional reach and place task using LUE to promote improved standing balance and gross motor control.  Pt required min VCs and TCs to facilitate proper hand placement and body mechanics during sit<>stand with CGA.  Pt needing frequent VCs to slow pace of movement down for increased control of LUE.  Pt able to transport 10/10 pieces of plastic fruit into/out of large bowl with increased time and only 2 occasions of dropping.  Pt transported to ortho gym and participated in seated and standing functional reach/dynamic balance task to tap targeted spot on elevated mat table in various directions including crossing midline using LUE.  Pt required CGA during sit<>stand and standing and supervision only during dynamic sitting balance.  Pt needed VCs and TCs to prevent left foot from retrostepping during transfer and standing.  Pt exhibited carryover after initial cueing provided. Pt then instructed through BLE w/c propulsion task with visual target, VCs and TCs to facilitate motor planning.  Pt needing mod assist to complete.  Pt returned to room, completed stand pivot transfer w/c to EOB with min assist.  Sit to supine with supervision.  Call bell in reach, bed alarm on.    Therapy Documentation Precautions:  Precautions Precautions: Fall Precaution Comments: left side ataxia and weakness Restrictions Weight Bearing Restrictions: No   Therapy/Group: Individual  Therapy  Amie Critchley 10/19/2020, 12:50 PM

## 2020-10-19 NOTE — Progress Notes (Signed)
[place] PHYSICAL MEDICINE & REHABILITATION PROGRESS NOTE   Subjective/Complaints: No complaints this morning. Denies pain, constipation, insomnia.  Eating lunch  ALP:[id number] CP, SOB, N/V/D  Objective:   No results found. No results for input(s): WBC, HGB, HCT, PLT in the last 72 hours. No results for input(s): NA, K, CL, CO2, GLUCOSE, BUN, CREATININE, CALCIUM in the last 72 hours.  Intake/Output Summary (Last 24 hours) at 10/19/2020 1421 Last data filed at 10/19/2020 1300 Gross per 24 hour  Intake 640 ml  Output 800 ml  Net -160 ml    Physical Exam: Vital Signs Blood pressure (!) 129/94, pulse (!) 54, temperature 98.3 F (36.8 C), temperature source Oral, resp. rate 18, height 5\' 7"  (1.702 m), weight 78.4 kg, SpO2 98 %.  General: Alert, No apparent distress HEENT: Head is normocephalic, atraumatic, PERRLA, EOMI, sclera anicteric, oral mucosa pink and moist, dentition intact, ext ear canals clear,  Neck: Supple without JVD or lymphadenopathy Heart: Bradycardic. No murmurs rubs or gallops Chest: CTA bilaterally without wheezes, rales, or rhonchi; no distress Abdomen: Soft, non-tender, non-distended, bowel sounds positive. Extremities: No clubbing, cyanosis, or edema. Pulses are 2+ Skin: Clean and intact without signs of breakdown Neuro:  Dysarthria, unchanged Follows simple commands.   Limited awareness of deficits  Left neglect, improving Left sided apraxia and ataxia, stable Motor: RUE: 4+/5 proximal distal, stable RLE: 4-4+/5 proximal distal, stable LUE: 4+/5 proximal distal, stable LLE: Hip flexion, knee extension 4/5, ankle dorsiflexion 3/5  Left upper extremity dysmetria and apraxia, slow improvement  Assessment/Plan: 1. 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  Physiatrist is providing close team supervision and 24 hour management of active  medical problems listed below.  Physiatrist and rehab team continue to assess barriers to discharge/monitor patient progress toward functional and medical goals    Medical Problem List and Plan: 1.  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  Antithrombotics: -DVT/anticoagulation: SCDs             -antiplatelet therapy: N/A 3. Pain Management: Tylenol as needed for headaches.  Controlled with meds 10/26 4. Mood: Advised emotional support             -antipsychotic agents: Seroquel 25 mg nightly DC'd 5. Neuropsych: This patient is is?  Fully capable of making decisions on his own behalf. 6. Skin/Wound Care: foam dressing, continue pressure relief. 7. Fluids/Electrolytes/Nutrition: Routine in and outs.    BMP within normal limits on 10/18 8.  Post stroke dysphagia.      Advanced to regular thins, now with intermittent supervision  Overall good po intake  Continue to advance diet as tolerated 9.  History of hypertension.    Lopressor 25 mg twice daily, decreased to 12.5 twice daily on 9/28, DC'd on 9/30  Lisinopril DC'd Vitals:   10/18/20 1935 10/19/20 0443  BP: 122/76 (!) 129/94  Pulse: 63 (!) 54  Resp: 16 18  Temp: 98.5 F (36.9 C) 98.3 F (36.8 C)  SpO2: 99% 98%   Well controlled 10/26 10.  Polysubstance abuse-alcohol, tobacco, cocaine abuse.  Monitor for any signs of withdrawal.    Supplement thiamine and folic acid  Counsel 11. Urinary retention/urinary incontinence:   Periods of incontinence incontinence  PVRs borderline  Will avoid medications at present given blood pressure  Continue to monitor 12.  Thrombocytosis: Resolved  Platelets 362 on 10/15 13.  Slow transit constipation  Bowel meds increased on 10/18, increased again on 10/23, increased again on 10/24  LOS: 34 days A FACE TO FACE EVALUATION WAS PERFORMED  Clint Bolder P Sherisse Fullilove 10/19/2020,  2:21 PM

## 2020-10-19 NOTE — Progress Notes (Signed)
Physical Therapy Weekly Progress Note  Patient Details  Name: Zachary Flores MRN: [id number] Date of Birth: 05-30-1966  Beginning of progress report period: October 12, 2020 End of progress report period: October 19, 2020  Today's Date: 10/19/2020 PT Individual Time: 9093-1121 - 6244-6950 PT Individual Time Calculation (min): 54 min + 53 min  Patient has met 5 of 10 long term goals. Pt is making slow but steady progress towards LTG. W/c mobility and standing balance goals have been updated and reflected in LTG note. He is able to perform bed mobility with supervision using bed rail, sit<>stand transfers with minA, stand<>pivot transfers with modA for balance, ambulated ~67f with modA +2 with HHA, and has propelled himself using BLEs in lowered w/c up to 1017fwith minA on level surfaces. He continues to be primarily limited by BLE ataxia, LUE apraxia, decreased insight into deficits, impaired safety/judgement, decreased balance.  Patient continues to demonstrate the following deficits muscle weakness, impaired timing and sequencing, unbalanced muscle activation, motor apraxia, ataxia, decreased coordination and decreased motor planning, decreased visual perceptual skills, decreased attention to left, decreased motor planning and ideational apraxia and decreased attention, decreased awareness, decreased problem solving, decreased safety awareness and decreased memory and therefore will continue to benefit from skilled PT intervention to increase functional independence with mobility.  Patient progressing toward long term goals.  Plan of care revisions: See LTG goal revisions.  PT Short Term Goals Week 5:  PT Short Term Goal 1 (Week 5): STG = LTG due to ELOS  Skilled Therapeutic Interventions/Progress Updates:     1st session: Pt received supine in bed, awake and agreeble to PT session without c/o pain. Donned pants with miNA for threading at the feet, he was able to use RUE to pull over  knees and hips. Donned shoes with totalA while supine in bed. Supine<>sit with supervision with HOB slightly elevated, heavy use of RUE to bedrail to assist. Stand<>pivot transfer from EOB to w/c requiring minA for standing and modA for balance during transfer, cues for safety and technique. Wheeled sinkside as pt requesting to brush teeth, he completed this with setupA while using RUE for oral care. Sit<>stand with minA from w/c height to remove cushion for purposes of w/c mobility training. (Cushion raises him too far to reach floor). He propelled himself ~10056fith minA on level surfaces, requiring assist for turns and avoiding obstacles in hallway, primarily using his feet for propelling. Stand<>pivot transfer with min/modA from w/c to Nustep. He completed x10 minutes of Nustep at workload 3 for purposes of BLE and LUE coordination training. He required hand-over-hand assist throughout for LUE grip (apraxia) and to prevent excessive wrist flexion. He also required frequent VC/TC for L knee alignment to neutral as it would be too far abducted, he was able to correct this. Stand<>pivot with min/modA back to w/c. He then performed 2 bouts of Dynavision training in standing with table in front to assist with balance. He required minA for standing balance while completing Dynavision and was instructed to use LUE as much as possible (when able and safely) to identify and press corresponding red lights with 1st digit. He would note to drag his hand up the board to press the button rather than isolating coordination to press it, difficulty correcting despite cues. WC transport back to his room for time management where he remained seated in w/c at end of session with safety belt alarm on, needs in reach.  2nd session: Pt received supine in bed, sleeping on  arrival but awakens easily to voice, no c/o pain. Supine<>sit with supervision with use of bedrail. Stand<>pivot with min/modA from EOB to w/c with cues for  safety and sequencing. WC transport with [place] for time management from his room to main hallway. Provided patient with different tennis shoes that have better grip than his old shoes. With these shoes, he was able to self propel himself with mostly supervision (intermittent minA for turns and avoiding obstacles) on level surfaces, able to propel with BLE's ~136f intervals. Pt very pleased with progress and improved mobility. WC transport outdoors with totalA in order to practice community mobility propulsion. He required minA for community mobility while he navigated around various environmental obstacles, including weaving in/out of outdoor pillars that were spaced ~229fapart. Pt enjoyed being outside as he hasn't been outdoors since his hospital admission (>33days). WC transport back inside with totalA for time management and wheeled to dayroom gym. Performed sit<>stands with minA to counter height and with medium sized red therapy ball, performed ball lifts vertically and horizontally, requiring minA for standing balance. Focusing on thoracic rotation/extension and LUE coordination training. He also completed hand-over-hand assist with LUE med ball push's/pulls/and wipes focusing on LUE ataxia and standing balance. Pt reporting moderating fatigue after these activities and requesting to return to room. He self propelled himself with supervision (minA for turns) in w/c using BLE's back to his room (~10083fand stand<>pivot transfer with min/modA from w/c to EOB. Sit>supine with supervision with use of bed features. He remained supine in bed with needs in reach, bed alarm on. Pt pleased with today's session.  Therapy Documentation Precautions:  Precautions Precautions: Fall Precaution Comments: left side ataxia and weakness Restrictions Weight Bearing Restrictions: No  Therapy/Group: Individual Therapy  Adilee Lemme P Latissa Frick PT 10/19/2020, 12:48 PM

## 2020-10-19 NOTE — Progress Notes (Signed)
Speech Language Pathology Daily Session Note  Patient Details  Name: Zachary Flores MRN: [id number] Date of Birth: 1966-10-05  Today's Date: 10/19/2020 SLP Individual Time: 2947-6546 SLP Individual Time Calculation (min): 25 min  Short Term Goals: Week 5: SLP Short Term Goal 1 (Week 5): STG=LTG due to remaining length of stay (pt is pending placement at SNF)  Skilled Therapeutic Interventions: Pt was seen for skilled ST targeting speech goals. SLP facilitated session with conversational as well as sentence level barrier speech tasks targeting pt's use of intelligibility strategies. He remained 90% intelligible at both levels and used increased intensity and slow rate with Supervision A level verbal cues. He also verbally recalled these strategies with Supervision at beginning of session. Pt left laying in bed with alarm set and needs within reach. Continue per current plan of care.          Pain Pain Assessment Pain Scale: 0-10 Pain Score: 0-No pain  Therapy/Group: Individual Therapy  [name] 10/19/2020, 9:53 AM

## 2020-10-19 NOTE — Progress Notes (Signed)
Patient ID: Zachary Flores, male   DOB: 02-08-1966, 54 y.o.   MRN: [id number] Reached out to Tammy-who hs received clinicals and is working on main issue is his being positive for drugs. Will await her getting back with this worker if can offer a bed at one of their facilities.

## 2020-10-20 ENCOUNTER — Inpatient Hospital Stay (HOSPITAL_COMMUNITY): Payer: Self-pay

## 2020-10-20 ENCOUNTER — Inpatient Hospital Stay (HOSPITAL_COMMUNITY): Payer: Self-pay | Admitting: Occupational Therapy

## 2020-10-20 NOTE — Progress Notes (Signed)
Occupational Therapy Session Note  Patient Details  Name: Zachary Flores MRN: [id number] Date of Birth: May 02, 1966  Today's Date: 10/20/2020 OT Individual Time: 2951-8841 OT Individual Time Calculation (min): 57 min    Short Term Goals: Week 5:  OT Short Term Goal 1 (Week 5): STGs= LTGs due to ELOS  Skilled Therapeutic Interventions/Progress Updates:    Pt completed shower and dressing during session as well as toileting tasks.  He was able to complete ambulation from the EOB to the 3:1 over the toilet with max assist.  Short choppy step length noted with increased trunk ataxia as well as rigidity.  Pt frequently takes small shuffling steps without adequate weightshift to either side.  Mod assist for completion of toileting tasks sit to stand with transfer over to the tub bench using the grab bars at the same level.  He was able to complete all bathing sit to stand with min assist and min instructional cueing for thoroughness.  Decreased ability to reach his feet, but has LH sponge that can be utilized for that.  He was able to dry off and then complete stand pivot transfer to the wheelchair at mod assist in order to work on dressing at the sink.  Supervision with min instructional cueing for donning a pullover shirt.  He then completed donning brief with mod assist and pants with min sit to stand.  Total assist for TEDs with mod assist for shoes as he is not able to tie them after donning them.  Finished session with pt applying lotion to his UEs from seated position.  He agreed to remain sitting up with the call button and phone in reach with safety belt in place.    Therapy Documentation Precautions:  Precautions Precautions: Fall Precaution Comments: left side ataxia and weakness Restrictions Weight Bearing Restrictions: No  Pain: Pain Assessment Pain Scale: Faces Pain Score: 0-No pain ADL: See Care Tool Section for some details of mobility and selfcare  Therapy/Group: Individual  Therapy  Skyelynn Rambeau OTR/L 10/20/2020, 3:45 PM

## 2020-10-20 NOTE — Progress Notes (Signed)
Physical Therapy Session Note  Patient Details  Name: Zachary Flores MRN: [id number] Date of Birth: 1966/01/06  Today's Date: 10/20/2020 PT Individual Time: 0076-2263 +   1130-1158 PT Individual Time Calculation (min): 55 min + 28 min   Short Term Goals: Week 5:  PT Short Term Goal 1 (Week 5): STG = LTG due to ELOS  Skilled Therapeutic Interventions/Progress Updates:     1st session: Pt received supine in bed, agreeable to PT session, no c/o pain. Donned scrub pants with minA for threading BLE's, required maxA in standing to pull over hips. RN present for morning medications. Supine<>sit with supervision with use of bed features. Able to sit EOB with supervision. Stand<>pivot transfer with minA for standing and modA for balance from EOB to w/c. Wheeled sinkside as pt requesting to brush teeth. He completed oral care with setupA while using primarily RUE. Donned tennis shoes with maxA while seated in w/c. He propelled himself ~183ft with supervision (intermittent minA for turns and avoiding obstacles) using BLE's only, placed in front of counter level table. Performed sit<>stands with mostly minA (occasional modA as session progressed 2/2 fatigue) to counter height. In standing, he completed dynamic standing balance tasks with red medium sized therapy ball. He performed chest press, upward reach, and lateral rotation all while holding the therapy ball, requiring minA guard for balance. Required TC for increasing R and L rotation in standing as pt is fearful of falling during these tasks. He also completed LUE propioceptive training with red ball, applying isometric lat pull down while therapist provided random perturbations to ball, instructing him to not allow ball to move, minA guard while standing. He then completed pre-gait training with static standing marching with counter height support, ataxic BLE's and decreased R foot clearance with excessive L foot clearance. Therapist cueing throughout for  safety, sequencing, and technique. WC transport back to his room at end of session where he remained seated in w/c at end of session with needs in reach, safety belt alarm on, and NT present for linen change.  2nd session: Pt received sitting in w/c, agreeable to PT session without c/o pain. Sit<>stand with minA with cues for safety and technique with emphasis on forward weight shift. While standing, therapist removed w/c cushion for purposes of w/c mobility. He propelled himself with supervision (still requiring minA for turns and obstacles) using BLE's on level surfaces, ~160ft. Stand<>pivot with min/modA from w/c to Nustep where he completed x10 minutes of Nustep at workload of 4. Emphasis on Nustep for BLE coordination training. He required frequent TC for L knee alignment and repositioning of L foot to promote L heel strike during Nustep. Also provided verbal cues for relaxing LUE as flexor synergy noted while he focuses on tasks. Stand<>pivot with min/modA from Nustep to w/c and w/c transport back to his room. Stand<>pivot with minA from w/c to EOB, cues for safety, forward weight shift, and sequencing. Sit>supine with supervision with use of bedrail, remained semi-reclined in bed at end of session, bed alarm on, needs in reach.  Therapy Documentation Precautions:  Precautions Precautions: Fall Precaution Comments: left side ataxia and weakness Restrictions Weight Bearing Restrictions: No  Therapy/Group: Individual Therapy  Zachary Flores PT 10/20/2020, 7:48 AM

## 2020-10-20 NOTE — Progress Notes (Signed)
[facility] PHYSICAL MEDICINE & REHABILITATION PROGRESS NOTE   Subjective/Complaints: Patient seen laying in bed this AM. He states he slept well overnight.  He states he is doing better.   ROS: Denies CP, SOB, N/V/D  Objective:   No results found. No results for input(s): WBC, HGB, HCT, PLT in the last 72 hours. No results for input(s): NA, K, CL, CO2, GLUCOSE, BUN, CREATININE, CALCIUM in the last 72 hours.  Intake/Output Summary (Last 24 hours) at 10/20/2020 0955 Last data filed at 10/20/2020 0700 Gross per 24 hour  Intake 660 ml  Output 550 ml  Net 110 ml    Physical Exam: Vital Signs Blood pressure 126/88, pulse (!) 52, temperature 98.3 F (36.8 C), temperature source Oral, resp. rate 18, height 5\' 7"  (1.702 m), weight 78.4 kg, SpO2 94 %.  Constitutional: No distress . Vital signs reviewed. HENT: Normocephalic.  Atraumatic. Eyes: EOMI. No discharge. Cardiovascular: No JVD.  RRR. Respiratory: Normal effort.  No stridor.  Bilateral clear to auscultation. GI: Non-distended.  BS +. Skin: Warm and dry.  Intact. Psych: Normal mood.  Normal behavior. Musc: No edema in extremities.  No tenderness in extremities. Neuro:  Dysarthria, stable Follows simple commands.   Limited awareness of deficits  Left neglect, stable Left sided apraxia and ataxia, stable Motor: RUE: 4+/5 proximal distal, stable RLE: 4-4+/5 proximal distal, stable LUE: 4+/5 proximal distal, ?improving LLE: Hip flexion, knee extension 4/5, ankle dorsiflexion 3/5  Left upper extremity dysmetria and apraxia, slow improvement  Assessment/Plan: 1. 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  Physiatrist is providing close team supervision and 24 hour management of active medical problems listed below.  Physiatrist and rehab team continue to assess barriers to discharge/monitor patient progress toward  functional and medical goals    Medical Problem List and Plan: 1.  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 Please see conference note from today as well.  2.  Antithrombotics: -DVT/anticoagulation: SCDs             -antiplatelet therapy: N/A 3. Pain Management: Tylenol as needed for headaches.  Controlled with meds 10/27 4. Mood: Advised emotional support             -antipsychotic agents: Seroquel 25 mg nightly DC'd 5. Neuropsych: This patient is is?  Fully capable of making decisions on his own behalf. 6. Skin/Wound Care: foam dressing, continue pressure relief. 7. Fluids/Electrolytes/Nutrition: Routine in and outs.    BMP within normal limits on 10/18 8.  Post stroke dysphagia.      Advanced to regular thins, now with intermittent supervision  Overall good po intake  Continue to advance diet as tolerated 9.  History of hypertension.    Lopressor 25 mg twice daily, decreased to 12.5 twice daily on 9/28, DC'd on 9/30  Lisinopril DC'd Vitals:   10/19/20 1932 10/20/20 0441  BP: 111/70 126/88  Pulse: 63 (!) 52  Resp: 16 18  Temp: 98.1 F (36.7 C) 98.3 F (36.8 C)  SpO2: 99% 94%   Controlled on 10/27 10.  Polysubstance abuse-alcohol, tobacco, cocaine abuse.  Monitor for any signs of withdrawal.    Supplement thiamine and folic acid  Counsel 11. Urinary retention/urinary incontinence:   Periods of incontinence incontinence  PVRs borderline, awaiting further values  Will avoid medications at present given blood pressure  Continue to monitor 12.  Thrombocytosis: Resolved  Platelets 362 on 10/15 13.  Slow transit constipation  Bowel meds increased on 10/18, increased again on 10/23,  increased again on 10/24  Improving  LOS: 35 days A FACE TO FACE EVALUATION WAS PERFORMED  Mounir Skipper [name] 10/20/2020, 9:55 AM

## 2020-10-20 NOTE — Progress Notes (Addendum)
Patient ID: Zachary Flores, male   DOB: [birth year], 54 y.o.   MRN: [id number]  Spoke with bessie-sister via telephone to inform of team conference, along with pt. Both aware still looking for SNF bed so pt can receive more rehab when discharged. Sister wants him to have a COVID shot while here. Have let Stacy-RN andKaren-RN of this. Pt still requires min-mod level of assistance improving slowly. Sister continues to work on his disability and medicaid.

## 2020-10-20 NOTE — Patient Care Conference (Signed)
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 Delice Lesch Care Coodinator Present: Dorien Chihuahua, RN, BSN, CRRN;Becky Dupree, LCSW Nurse Present: Isla Pence, RN PT Present: Ginnie Smart, PT OT Present: Leretha Pol, OT SLP Present: Jettie Booze, CF-SLP PPS Coordinator present : Ileana Ladd, PT     Current Status/Progress Goal Weekly Team Focus  Bowel/Bladder             Swallow/Nutrition/ Hydration   regular textures, thin liquids, intermittent supervision  Supervision  swallow goals met   ADL's   Supervision UB ADLs, minA LB dressing/bathing, modA toileting, MinA functional transfers  CGA to [place]  selfcare training, neuro re-ed LUE/LLE, transfer training, AE training   Mobility   minA bed mobility, min/modA stand<>pivot transfers, gait ~75f modA +2 with HHA, w/c mobility 1023fwith minA  min/modA goals.  w/c propulsion, functional transfers, dynamic standing balance, gait progressions, BLE/LUE coordination  training, safety awareness   Communication   Supervision ~90% intelligible sentence and conversation level, may need increased cueing when fatigued  Supervision  carryover speech intelligibility strategies, self monitoring   Safety/Cognition/ Behavioral Observations  Min-Mod, emergent awareness most impacted  Supervision-Min  basic problem solving, error awareness, selective attention   Pain             Skin               Discharge Planning:  have reached out to management regarding assistance with finding a NH bed for pt. Hold up is LOG and substance abuse issues. Pt and sister aware of this   Team Discussion: Continent during the day; incontinent at night. Safety awareness improved, impulsivity decreased. Slow progress. SLP noted error awareness and safety currently min-mod assist. Mod assist for transfers due to balance  Patient on target to meet rehab goals: yes  *See Care Plan and progress notes for long and short-term goals.   Revisions to Treatment Plan:   Teaching Needs: Transfers, toileting, medications, safety , etc.  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 use  Barriers to Discharge: Medical stability;Decreased family/caregiver support;Other (comments)  Barriers to Discharge Comments: hx of substance abuse Possible Resolutions to [employer]: Therapies, adjust bowel meds as  necessary, monitor BP, continue to advance diet as tolerated   Continued Need for Acute Rehabilitation Level of Care: The patient requires daily medical management by a physician with specialized training in physical medicine  and rehabilitation for the following reasons: Direction of a multidisciplinary physical rehabilitation program to maximize functional independence : Yes Medical management of patient stability for increased activity during participation in an intensive rehabilitation regime.: Yes Analysis of laboratory values and/or radiology reports with any subsequent need for medication adjustment and/or medical intervention. : Yes   I attest that I was present, lead the team conference, and concur with the assessment and plan of the team.   Dorien Chihuahua B 10/20/2020, 4:18 PM

## 2020-10-20 NOTE — Progress Notes (Signed)
Occupational Therapy Session Note  Patient Details  Name: Zachary Flores MRN: [id number] Date of Birth: 1966-05-13  Today's Date: 10/20/2020 OT Individual Time: 1448-1540 OT Individual Time Calculation (min): 52 min    Short Term Goals: Week 5:  OT Short Term Goal 1 (Week 5): STGs= LTGs due to ELOS  Skilled Therapeutic Interventions/Progress Updates:    Pt sitting up in w/c, no c/o pain.  Pt requesting to work on w/c propulsion during session to increase independence with mobility and self care.  Pt propelled w/c using BLE with supervision in hallways and min/modA to maneuver in small spaces, approach working surfaces, and avoid obstacles due to ataxia.  Frequent VCs needed to facilitate slowing of propulsion to improve control and safety.  Pt completed tabletop activity in dayroom using left hand to pinch and place various resistance level clothespins.  Pt required increased time, intermittent VCs/TCs for fine motor control and positioning, and dropped approximately 25% of clothespins at first attempt.  Pt propelled back to room with same assistance level needed as prior.  Pt completed stand pivot w/c to EOB with min assist and mod VCs for hand placement.  Pt doffed pants with increased time and supervision.  Doffed brief with min assist, completed pericare with supervision, and donned clean brief with mod assist at bed level.  Donned socks with min assist.  Call bell in reach, bed alarm on.     Therapy Documentation Precautions:  Precautions Precautions: Fall Precaution Comments: left side ataxia and weakness Restrictions Weight Bearing Restrictions: No   Therapy/Group: Individual Therapy  Amie Critchley 10/20/2020, 4:25 PM

## 2020-10-20 NOTE — Progress Notes (Signed)
Speech Language Pathology Weekly Progress and Session Note  Patient Details  Name: Zachary Flores MRN: [id number] Date of Birth: 02-03-1966  Beginning of progress report period: October 14, 2020 End of progress report period: October 21, 2020   Short Term Goals: Week 5: SLP Short Term Goal 1 (Week 5): STG=LTG due to remaining length of stay (pt is pending placement at SNF) SLP Short Term Goal 1 - Progress (Week 5): Progressing toward goal    New Short Term Goals: Week 6: SLP Short Term Goal 1 (Week 6): STG=LTG due to remaining length of stay (pt is pending placement at SNF)  Weekly Progress Updates: Pt is still progressing toward all long term goals as he awaits SNF placement, although his overall progress has started to slow/plateau in regards to his cognition. Pt is currently Min-Mod assist for basic tasks due to deficits in his emergent awareness, attention, and problem solving. Improvements in his left visual inattention and visual scanning as well as safety awareness have been noted over the last week. Pt is consuming a regular texture diet with thin liquids and is using safe and compensatory swallow strategies Supervision-Mod I. He has also improved speech intelligibility to ~90% intelligibility in conversation with Supervision-Min A cues depending on fatigue. Pt and education is ongoing; no family has been present for ST. Pt would continue to benefit from skilled ST while inpatient in order to maximize functional independence and reduce burden of care prior to discharge to. Anticipate that pt will need 24/7 supervision at discharge in addition to ST follow up at next level of care - SNF is recommended and pt currently awaiting bed offer.       Intensity: Minumum of 1-2 x/day, 30 to 90 minutes Frequency: 3 to 5 out of 7 days Duration/Length of Stay: TBD; pending SNF placement Treatment/Interventions: Cognitive remediation/compensation;Internal/external aids;Functional  tasks;Patient/family education;Speech/Language facilitation;Therapeutic Activities;Dysphagia/aspiration precaution training;Cueing hierarchy   [name] 10/21/2020, 7:21 AM

## 2020-10-21 ENCOUNTER — Inpatient Hospital Stay (HOSPITAL_COMMUNITY): Payer: Self-pay

## 2020-10-21 ENCOUNTER — Inpatient Hospital Stay (HOSPITAL_COMMUNITY): Payer: Self-pay | Admitting: Occupational Therapy

## 2020-10-21 NOTE — Progress Notes (Signed)
Speech Language Pathology Daily Session Note  Patient Details  Name: Zachary Flores MRN: [id number] Date of Birth: 03-14-1966  Today's Date: 10/22/2020 SLP Individual Time: 9528-4132 SLP Individual Time Calculation (min): 55 min  Short Term Goals: Week 6: SLP Short Term Goal 1 (Week 6): STG=LTG due to remaining length of stay (pt is pending placement at SNF)  Skilled Therapeutic Interventions: Pt was seen for skilled ST targeting cognitive goals. SLP facilitated session with 1 novel and 1 familiar task for pt. He required Min-Mod verbal and visual cues for problem solving during the novel deductive reasoning closet organization task, and reduced Supervision-Min A for problem solving during familiar card task (Blink) - vast improvement since last targeted. He continues to require Min-Mod A multimodal cueing for awareness of errors, and Min A to reduce impulsivity during tasks at times. Pt was left laying in bed with alarm set and needs within reach. Continue per current plan of care.          Pain Pain Assessment Pain Scale: 0-10 Pain Score: 0-No pain  Therapy/Group: Individual Therapy  [name] 10/22/2020, 12:11 PM

## 2020-10-21 NOTE — Progress Notes (Addendum)
Patient ID: Zachary Flores, male   DOB: May 18, 1966, 54 y.o.   MRN: [id number]  Spoke with Reyne Dumas who reports the [facility] can not offer him a bed due to his being positive on admission. Have reached out to Director and VP, for assistance.

## 2020-10-21 NOTE — Progress Notes (Signed)
Physical Therapy Session Note  Patient Details  Name: Zachary Flores MRN: [id number] Date of Birth: 1966-05-13  Today's Date: 10/21/2020 PT Individual Time: 1062-6948 + 1345-1500 PT Individual Time Calculation (min): 58 min  + 75 min  Short Term Goals: Week 5:  PT Short Term Goal 1 (Week 5): STG = LTG due to ELOS  Skilled Therapeutic Interventions/Progress Updates:     1st session Pt received supine in bed, awake and agreeable to PT session, without c/o pain. Donned pants with modA while supine, assist for threading BLE's. Donned shoes with totalA for time management. Supine<>sit with supervision and HOB flat, relying on RUE heavily to bedrail for assist. Able to sit EOB with supervision and stand<>pivot transfer with minA for standing and modA for balance. Wheeled sinkside where he performed oral hygiene with setupA using RUE. He was able to maintain LUE grasp to small toothpaste and used RUE to removal cap. He propelled himself in w/c using BLE's only, without cushion, with mostly supervision on level surfaces, ~13ft. Required intermittent minA for turns and avoiding obstacles. Focused remainder of session on gait training. Therefore, he ambulated 62ft + 32ft + 161ft with modA +2 with HHA on level surfaces. Gait deficits include BLE ataxia, excessive L hip/knee flexion with "stomping" motion, RLE circumduction with intermittent toe drag and decreased clearance. Cues provided throughout for safety, normalizing gait pattern, and pacing. He then propelled himself with minA in w/c back to his bedroom, around ~150ft. He remained seated in w/c at end of session with safety belt alarm on, needs in reach.  2nd session: Pt received supine in bed, agreeable to PT session without c/o pain. Donned shoes with maxA while he was in supine. Supine<>sit with supervision with use of RUE to bedrail. Stand<>pivot with minA for standing and modA for balance to w/c. He propelled himself ~59ft with supervision in  hallways using BLE's only, improved cadence with w/c propulsion as well as ability to navigate large obstacles. Stand<>pivot with min/modA from w/c to mat table. Performed sit<>stand with minA and while standing, was instructed to place clothespins on line. He would reach with RUE and use pincer grasp with LUE to place pins. Successful ~25% of the time. He also repeated this in sitting position, where he was successful ~75% of the time. He then performed dynamic standing balance tasks with tossing cornhole bags, requiring minA for balance and used RUE to toss bags, pt enjoyed this Wheeled to [employer] where he performed x10 minutes in seated position, focusing on BLE coordination and L hip extension. Workload at 40 cm/sec. Required cues for L knee control and forward trunk lean as he would push posteriorly with his trunk. He propelled himself back to his room with BLE's in w/c, ~160ft requiring minA 2/2 fatigue. Stand<>pivot with modA from w/c to EOB, required maxA for removing shoes. Sit>Supine with supervision with bed features. Remained supine in bed at end of session, bed alarm on, needs in reach.  Therapy Documentation Precautions:  Precautions Precautions: Fall Precaution Comments: left side ataxia and weakness Restrictions Weight Bearing Restrictions: No  Therapy/Group: Individual Therapy  Danna Casella P Daniele Dillow PT 10/21/2020, 8:20 AM

## 2020-10-21 NOTE — Progress Notes (Signed)
[place] PHYSICAL MEDICINE & REHABILITATION PROGRESS NOTE   Subjective/Complaints: Patient seen sitting up in bed and later with therapies.  He states he slept well overnight.  He states that his sister came to see him yesterday and told him he is doing well.  ROS: Denies CP, SOB, N/V/D  Objective:   No results found. No results for input(s): WBC, HGB, HCT, PLT in the last 72 hours. No results for input(s): NA, K, CL, CO2, GLUCOSE, BUN, CREATININE, CALCIUM in the last 72 hours.  Intake/Output Summary (Last 24 hours) at 10/21/2020 1152 Last data filed at 10/21/2020 0800 Gross per 24 hour  Intake 220 ml  Output 250 ml  Net -30 ml    Physical Exam: Vital Signs Blood pressure 122/82, pulse (!) 56, temperature 98.3 F (36.8 C), temperature source Oral, resp. rate 18, height 5\' 7"  (1.702 m), weight 78.4 kg, SpO2 98 %.  Constitutional: No distress . Vital signs reviewed. HENT: Normocephalic.  Atraumatic. Eyes: EOMI. No discharge. Cardiovascular: No JVD.  RRR. Respiratory: Normal effort.  No stridor.  Bilateral clear to auscultation. GI: Non-distended.  BS +. Skin: Warm and dry.  Intact. Psych: Normal mood.  Normal behavior. Musc: No edema in extremities.  No tenderness in extremities. Neuro: Alert Dysarthria, stable Follows simple commands.   Limited awareness of deficits  Left neglect, stable Left sided apraxia and ataxia, stable Motor: RUE: 4+/5 proximal distal, stable RLE: 4-4+/5 proximal distal, stable LUE: 4+/5 proximal distal, ?improving LLE: Hip flexion, knee extension 4/5, ankle dorsiflexion 3/5  Left upper extremity dysmetria and apraxia, stable  Assessment/Plan: 1. 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  Physiatrist is providing close team supervision and 24 hour management of active medical problems listed below.  Physiatrist and rehab team  continue to assess barriers to discharge/monitor patient progress toward functional and medical goals    Medical Problem List and Plan: 1.  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  Antithrombotics: -DVT/anticoagulation: SCDs             -antiplatelet therapy: N/A 3. Pain Management: Tylenol as needed for headaches.  Controlled with meds 10/28 4. Mood: Advised emotional support             -antipsychotic agents: Seroquel 25 mg nightly DC'd 5. Neuropsych: This patient is is?  Fully capable of making decisions on his own behalf. 6. Skin/Wound Care: foam dressing, continue pressure relief. 7. Fluids/Electrolytes/Nutrition: Routine in and outs.    BMP within normal limits on 10/18 8.  Post stroke dysphagia.      Advanced to regular thins, now with intermittent supervision  Overall good po intake  Continue to advance diet as tolerated 9.  History of hypertension.    Lopressor 25 mg twice daily, decreased to 12.5 twice daily on 9/28, DC'd on 9/30  Lisinopril DC'd Vitals:   10/20/20 1944 10/21/20 0421  BP: 106/71 122/82  Pulse: 64 (!) 56  Resp: 18 18  Temp: 98.5 F (36.9 C) 98.3 F (36.8 C)  SpO2: 98%    Controlled on 10/28 10.  Polysubstance abuse-alcohol, tobacco, cocaine abuse.  Monitor for any signs of withdrawal.    Supplement thiamine and folic acid  Counsel 11. Urinary retention/urinary incontinence:   Periods of incontinence incontinence  PVRs remain borderline  Will avoid medications at present  given blood pressure  Continue to monitor 12.  Thrombocytosis: Resolved  Platelets 362 on 10/15 13.  Slow transit constipation  Bowel meds increased on 10/18, increased again on 10/23, increased again on 10/24  Improving  LOS: 36 days A FACE TO FACE EVALUATION WAS PERFORMED  Selassie Spatafore [name] 10/21/2020, 11:52 AM

## 2020-10-21 NOTE — Progress Notes (Signed)
Occupational Therapy Session Note  Patient Details  Name: Zachary Flores MRN: [id number] Date of Birth: [birth year]  Today's Date: 10/21/2020 OT Individual Time: 1000-1100 OT Individual Time Calculation (min): 60 min    Short Term Goals: Week 5: STGs=LTGs due to ELOS  Skilled Therapeutic Interventions/Progress Updates:    Pt sitting up in w/c, requesting to toilet, no c/o pain.  Pt self propelled to bathroom with min assist to propel over steep threshold and approach toilet at appropriate angle.  Pt completed stand pivot w/c>3in1 commode using grab bar with min assist and max VCs for body mechanics and BUE/BLE placement. Pt pulled pants down over hips with min assist in standing then stand to sit with min assist.  Pt had continent episode of bowel with distant supervision static sitting on 3 in 1 commode.  Pt completed sit<>stand with CGA and pericare/pulled pants over hips with min assist and max VCs/TCs for safe BUE/BLE placement.  Stand pivot to w/c using grab bars with min assist and max VC/TCs.  Pt transported to sink and completed hand and oral hygiene in standing with min assist and max VCs/TCs for body mechanics during sit<>stand.  Pt then participated in w/c propulsion training using 4lb ankle weights donned to each ankle for proprioceptive feedback.  Provided VCs to increase pt awareness of rate of propulsion and instructed pt to take larger steps at slower pace.  Pt demonstrated fair/good follow through.  Pt stood in parallel bars while weight shifting left and right with mirror in front for visual feedback.  Upgraded with unilateral lower extremity stepping in place then taking two steps forward and back with min assist and max VCs.  Pt transported back to room for time mgt.  Call bell in reach, seat belt alarm on.  Therapy Documentation Precautions:  Precautions Precautions: Fall Precaution Comments: left side ataxia and weakness Restrictions Weight Bearing Restrictions:  No   Therapy/Group: Individual Therapy  Amie Critchley 10/21/2020, 12:21 PM

## 2020-10-22 ENCOUNTER — Inpatient Hospital Stay (HOSPITAL_COMMUNITY): Payer: Self-pay | Admitting: Occupational Therapy

## 2020-10-22 ENCOUNTER — Inpatient Hospital Stay (HOSPITAL_COMMUNITY): Payer: Self-pay | Admitting: Speech Pathology

## 2020-10-22 ENCOUNTER — Inpatient Hospital Stay (HOSPITAL_COMMUNITY): Payer: Self-pay

## 2020-10-22 NOTE — Progress Notes (Signed)
Occupational Therapy Session Note  Patient Details  Name: Zachary Flores MRN: [id number] Date of Birth: Nov 13, 1966  Today's Date: 10/22/2020 OT Individual Time: 1300-1410 OT Individual Time Calculation (min): 70 min    Short Term Goals: Week 5:  OT Short Term Goal 1 (Week 5): STGs= LTGs due to ELOS  Skilled Therapeutic Interventions/Progress Updates:    Pt sitting up in w/c, requesting to toilet, no c/o pain.  Pt transported to bathroom due to urgency.  Stand pivot w/c to 3 in 1 commode using grab bar with grab bar.  Pt completed toileting with CGA and setup.  Stand pivot back to w/c with min assist using grab bar and stand pivot to TTB using grab bar with min assist.  Pt bathed UB and LB using long handled sponge as needed with CGA and mod VCs for safety and problem solving.  Stand pivot to w/c using grab bar with min assist.  Pt transported to sinkside to complete UB/LB dressing and grooming.  Pt donned shirt with setup, brief and pants with min assist including sit to stand needing mod VCs for body mechanics.  Pt donned socks with increased time needing min assist for initial threading over toes.  Pt shaved beard with supervision sitting sinkside.  Stand pivot w/c to EOB with CGA and mod VCs for body mechanics.  Sit to supine with supervision.  Pt completed figure 4 stretching RLE to promote improved ability to donn socks in seated position 5 x 10 second holds.  Call bell in reach, bed alarm on.  Therapy Documentation Precautions:  Precautions Precautions: Fall Precaution Comments: left side ataxia and weakness Restrictions Weight Bearing Restrictions: No   Therapy/Group: Individual Therapy  Amie Critchley 10/22/2020, 4:08 PM

## 2020-10-22 NOTE — Progress Notes (Signed)
Physical Therapy Session Note  Patient Details  Name: Zachary Flores MRN: [id number] Date of Birth: [birth year]  Today's Date: 10/22/2020 PT Individual Time: 1000-1055 PT Individual Time Calculation (min): 55 min   Short Term Goals: Week 5:  PT Short Term Goal 1 (Week 5): STG = LTG due to ELOS  Skilled Therapeutic Interventions/Progress Updates:    Pt received supine in bed, agreeable to PT session without c/o pain. Donned pants with modA while supine in bed, assist for threading over BLE's and required minA in standing to pull over hips. Supine<>sit with supervision with HOB flat, use of bedrail with RUE. Stand<>pivot transfer with min/modA from EOB to w/c, cues throughout for safety and sequencing. Doffed dirty shirt while sitting in w/c with supervision. Required minA for donning new shirt, mainly assist for pulling shirt over back. Wheeled sinkside as pt requesting to brush his teeth. He performed oral care with setupA, primarily using RUE. Pt propelled himself with minA in w/c, using BLE's only, from his room to dayroom gym (~141ft), cues for technique, forward weight shift, and cadence. From there, he performed various standing balance activities included in the [place] Festival. This included Festival Pong and reaching for Apples (with reacher as AD). With [name], he was requiring minA for standing while using RUE to toss ping-pong ball into cups that were ~61ft away. Emphasis placed on dynamic standing balance and RUE coordination. Note with multi-tasking, L knee flexion and L foot inversion, requiring TC's for correction. With reaching for apples, he required minA for standing balance while using RUE to grasp reacher to grab apples out of bucket of water. He successfully got 8 apples in 60 seconds! Activities assisted with patient engagement and he enjoyed these greatly. He then self propelled manual w/c ~117ft, requiring minA due to veering R and navigating turns/obstacles. Returned to his room,  sit<>stand with minA and placed w/c cushion for support and pressure relief. He remained seated in w/c at end of session with needs in reach, safety belt alarm on.  Therapy Documentation Precautions:  Precautions Precautions: Fall Precaution Comments: left side ataxia and weakness Restrictions Weight Bearing Restrictions: No  Therapy/Group: Individual Therapy  Shelie Lansing P Kole Hilyard PT 10/22/2020, 10:13 AM

## 2020-10-22 NOTE — Progress Notes (Signed)
[place] PHYSICAL MEDICINE & REHABILITATION PROGRESS NOTE   Subjective/Complaints: Patient seen sitting up in bed this morning, with therapies.  He states he slept well overnight.  He denies complaints.  ROS: Denies CP, SOB, N/V/D  Objective:   No results found. No results for input(s): WBC, HGB, HCT, PLT in the last 72 hours. No results for input(s): NA, K, CL, CO2, GLUCOSE, BUN, CREATININE, CALCIUM in the last 72 hours.  Intake/Output Summary (Last 24 hours) at 10/22/2020 1058 Last data filed at 10/22/2020 1000 Gross per 24 hour  Intake 240 ml  Output 900 ml  Net -660 ml    Physical Exam: Vital Signs Blood pressure 127/76, pulse 68, temperature 98.2 F (36.8 C), temperature source Oral, resp. rate 18, height 5\' 7"  (1.702 m), weight 78.4 kg, SpO2 100 %.  Constitutional: No distress . Vital signs reviewed. HENT: Normocephalic.  Atraumatic. Eyes: EOMI. No discharge. Cardiovascular: No JVD.  RRR. Respiratory: Normal effort.  No stridor.  Bilateral clear to auscultation. GI: Non-distended.  BS +. Skin: Warm and dry.  Intact. Psych: Normal mood.  Normal behavior. Musc: No edema in extremities.  No tenderness in extremities. Neuro: Alert Dysarthria, unchanged Follows simple commands.   Limited awareness of deficits  Left neglect, stable Left sided apraxia and ataxia, stable Motor: RUE: 4+/5 proximal distal, stable RLE: 4-4+/5 proximal distal, stable LUE: 4+/5 proximal distal, ?improving LLE: Hip flexion, knee extension 4/5, ankle dorsiflexion 3/5  Left upper extremity dysmetria and apraxia, unchanged  Assessment/Plan: 1. 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  Physiatrist is providing close team supervision and 24 hour management of active medical problems listed below.  Physiatrist and rehab team continue to assess barriers to discharge/monitor patient  progress toward functional and medical goals    Medical Problem List and Plan: 1.  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  Antithrombotics: -DVT/anticoagulation: SCDs             -antiplatelet therapy: N/A 3. Pain Management: Tylenol as needed for headaches.  Controlled with meds 10/29 4. Mood: Advised emotional support             -antipsychotic agents: Seroquel 25 mg nightly DC'd 5. Neuropsych: This patient is is?  Fully capable of making decisions on his own behalf. 6. Skin/Wound Care: foam dressing, continue pressure relief. 7. Fluids/Electrolytes/Nutrition: Routine in and outs.    BMP within normal limits on 10/18 8.  Post stroke dysphagia.      Advanced to regular thins, now with intermittent supervision  Overall good po intake  Continue to advance diet as tolerated 9.  History of hypertension.    Lopressor 25 mg twice daily, decreased to 12.5 twice daily on 9/28, DC'd on 9/30  Lisinopril DC'd Vitals:   10/21/20 2008 10/22/20 0551  BP: 120/90 127/76  Pulse: 67 68  Resp: 18 18  Temp: 98.2 F (36.8 C) 98.2 F (36.8 C)  SpO2: 99% 100%   Controlled on 10/29 10.  Polysubstance abuse-alcohol, tobacco, cocaine abuse.  Monitor for any signs of withdrawal.    Supplement thiamine and folic acid  Counsel 11. Urinary retention/urinary incontinence:   Periods of incontinence incontinence  PVRs improving  Will avoid medications at present given blood pressure  Continue to monitor 12.  Thrombocytosis: Resolved  Platelets 362 on 10/15 13.  Slow transit constipation  Bowel meds increased on 10/18, increased again on 10/23, increased again on 10/24  Improving  LOS: 37 days A FACE TO FACE EVALUATION WAS PERFORMED  Lizzy Hamre [name] 10/22/2020, 10:58 AM

## 2020-10-23 ENCOUNTER — Inpatient Hospital Stay (HOSPITAL_COMMUNITY): Payer: Self-pay

## 2020-10-23 NOTE — Progress Notes (Signed)
Physical Therapy Session Note  Patient Details  Name: Zachary Flores MRN: [id number] Date of Birth: 16-Jan-1966  Today's Date: 10/23/2020 PT Individual Time: 0915-1000 PT Individual Time Calculation (min): 45 min   Short Term Goals: Week 5:  PT Short Term Goal 1 (Week 5): STG = LTG due to ELOS  Skilled Therapeutic Interventions/Progress Updates:    Pt received supine in bed, agreeable to PT session without c/o pain. Donned TED"s and shoes with totalA for time management. Required modA for putting on scrub pants while supine. Supine<>sit with supervision and use of bed rail, required modA for stand<>pivot from EOB to w/c with cues provided throughout for safety awareness, sequencing, and technique. Continues to be limited in functional mobility primarily by significant BLE and LUE ataxia as well as poor spatial awareness. Also, he sometimes will invert L foot during transfers, requiring cues for monitoring and correcting. Wheeled sinkside where he was able to perform oral care with setupA. He then propelled himself ~162ft with BLE's only in w/c, occasionally minA required for turns and avoiding walls. He ambulated 33ft + 10ft with modA +2 via hand held assist, gait distance limited by significant BLE ataxia and unsteadiness, unable to safely progress this session. Therefore, deferred functional gait training to focus on dynamic standing balance with emphasis on breaking up extensor tone. Stand<>pivot with modA from w/c to EOM, able to sit EOM with supervision. Performed sit<>stand with minA from EOM. While standing and therapist providing minA guard, had 2nd person tossing soft ball for him to catch and throw back, focusing on dynamic standing balance and BUE coordination; multiple failed efforts at catching and tossing but this improved with repetitions. After seated rest break, he repeated this activity, however, was instructed with each catch to raise ball overhead, focusing on thoracic extension and  reaching outside BOS, requiring minA guard from therapist. Stand<>sit with minA and cues for safety and controlled lowering. Placed large therapy ball in front of him while seated EOM and had him place BUE on ball, rolling it forwards and backwards, emphasis forward weight shift and pelvic mobility. Also completed seated thoracic rotations with small therapy ball, to assist with thoracic rotation as pt's trunk is very rigid in functional mobility tasks. Stand<>pivot with modA back to his w/c and w/c transport back to his room where he remained seated in w/c with safety belt alarm on, needs in reach.  Therapy Documentation Precautions:  Precautions Precautions: Fall Precaution Comments: left side ataxia and weakness Restrictions Weight Bearing Restrictions: No  Therapy/Group: Individual Therapy  Rupa Lagan P Casyn Becvar PT 10/23/2020, 9:57 AM

## 2020-10-25 ENCOUNTER — Inpatient Hospital Stay (HOSPITAL_COMMUNITY): Payer: Self-pay

## 2020-10-25 ENCOUNTER — Inpatient Hospital Stay (HOSPITAL_COMMUNITY): Payer: Self-pay | Admitting: Occupational Therapy

## 2020-10-25 ENCOUNTER — Inpatient Hospital Stay (HOSPITAL_COMMUNITY): Payer: Self-pay | Admitting: Speech Pathology

## 2020-10-25 NOTE — Progress Notes (Signed)
Physical Therapy Session Note  Patient Details  Name: Zachary Flores MRN: [id number] Date of Birth: 01/06/1966  Today's Date: 10/25/2020 PT Individual Time: 0800-0900 PT Individual Time Calculation (min): 60 min   Short Term Goals: Week 5:  PT Short Term Goal 1 (Week 5): STG = LTG due to ELOS  Skilled Therapeutic Interventions/Progress Updates:    Pt received supine in bed, agreeable to PT session. No reports of pain. Donned TED's and shoes with totalA, required modA for donning scrub pants while supine in bed. Supine<>sit with supervision with HOB flat, use of bedrail with RUE. Stand<>pivot transfer with min/modA from EOB to w/c, cues for safety awareness and general sequencing, limited by BLE ataxia and poor spatial-ground orientation. Wheeled sinkside where he performed oral care with setupA, primarily using RUE for hygiene. WC transport for time management from his room to main therapy hallway. Pt requesting to focus session on gait training. He ambulated 2x40ft (seated rest) with modA +2 via hand held assist; cues throughout for BLE coordination, cadence, stepping pattern. With gait, demo's significant truncal and general rigidity. During rest break, focused on BLE LAQ with emphasis on coordination rather than strengthening. Pt then propelled himself ~83ft with minA in w/c, using BLE's only, focusing on both BLE coordination and w/c propulsion, cues for cadence and sequencing. Stand<>pivot transfer with min/modA from w/c to Nustep. Performed x10 minutes of Nustep at workload of 4, again focusing on BLE coordination, using BLE's only. Pt with significant dificutly completing Nustep as he would often try to push with BLE's vs alternating push/pull pattern. Also difficulty with having L foot inversion and toe pushing, unable to keep L heel flat on foot platform. Stand<>pivot with min/modA from Nustep back to his w/c and w/c transport back to his room, remained seated in w/c at end of session with  safety belt alarm on, needs in reach.   Therapy Documentation Precautions:  Precautions Precautions: Fall Precaution Comments: left side ataxia and weakness Restrictions Weight Bearing Restrictions: No  Therapy/Group: Individual Therapy  Jillienne Egner P Daviyon Widmayer PT 10/25/2020, 7:55 AM

## 2020-10-25 NOTE — Progress Notes (Signed)
Speech Language Pathology Daily Session Note  Patient Details  Name: Zachary Flores MRN: [id number] Date of Birth: 02-Dec-1966  Today's Date: 10/25/2020 SLP Individual Time: 1031-1056 SLP Individual Time Calculation (min): 25 min  Short Term Goals: Week 6: SLP Short Term Goal 1 (Week 6): STG=LTG due to remaining length of stay (pt is pending placement at SNF)  Skilled Therapeutic Interventions: Pt was seen for skilled ST targeting cognitive goals. SLP facilitated session with a familiar basic money management task using cash and coins to assess pt's carryover of problem solving strategies and error awareness. Pt with significantly improved ability to complete task in comparison to first time targeted early in his stay. He displayed set amounts of money and calculated totals with Supervision A verbal and visual cues for problem solving and error awareness. Increased Min A verbal and visual cueing required for the same skills only when calculating change (subtraction). Of note, pt's speech was less intelligible today in comparison to previous visits; SLP suggested that pt focus on use of just 1 strategy (slow rate) for intelligibility over next day in hopes to increase intelligibility and decrease cognitive burden that would be requiring to implement multiple strategies at once. Will reassess at next visit. Pt left sitting in wheelchair with alarm set and needs within reach. Continue per current plan of care.          Pain Pain Assessment Pain Scale: 0-10 Pain Score: 0-No pain  Therapy/Group: Individual Therapy  Zachary Flores 10/25/2020, 12:12 PM

## 2020-10-25 NOTE — Progress Notes (Signed)
Occupational Therapy Session Note  Patient Details  Name: Zachary Flores MRN: [id number] Date of Birth: [birth year]  Today's Date: 10/25/2020 OT Individual Time: 1000-1027 OT Individual Time Calculation (min): 27 min   Short Term Goals: Week 5:  OT Short Term Goal 1 (Week 5): STGs= LTGs due to ELOS  Skilled Therapeutic Interventions/Progress Updates:    Pt greeted in the w/c with no c/o pain. Agreeable to session, started with oral care w/c level. Pt completed several other grooming tasks afterwards including face washing, hand washing, and location application. For all stated tasks, pt required vcs to increase functional use of Lt and to reach with Lt to improve coordination. Afterwards continued working on bimanual training by donning 3 pillowcases onto his pillows, pt required cues for increasing visual attendance to Lt. Able to meet task demands without physical assistance. He was A+Ox4, able to read wall clock, names of NT and RN on his wall board. Discussed pts functional presentation as well as his profound visual deficits during last session with this therapist. Pt very pleased by his progress, stated that therapy is "really helping." He is motivated to make more functional gains to d/c home and see his grandchildren. Pt remained sitting up in the w/c at close of session, all needs within reach and safety belt fastened.   Note that pt is also quite impulsive and requires vcs to slow down and wait for therapist to set up tasks  Therapy Documentation Precautions:  Precautions Precautions: Fall Precaution Comments: left side ataxia and weakness Restrictions Weight Bearing Restrictions: No ADL: ADL Eating: Moderate assistance Where Assessed-Eating: Wheelchair Grooming: Moderate assistance Where Assessed-Grooming: Sitting at sink Upper Body Bathing: Maximal assistance, Maximal cueing Where Assessed-Upper Body Bathing: Sitting at sink Upper Body Dressing: Maximal assistance, Maximal  cueing Where Assessed-Upper Body Dressing: Sitting at sink     Therapy/Group: Individual Therapy  Anthonia Monger A Angelo Caroll 10/25/2020, 12:28 PM

## 2020-10-25 NOTE — Progress Notes (Signed)
Occupational Therapy Session Note  Patient Details  Name: Zachary Flores MRN: [id number] Date of Birth: 12-06-1966  Today's Date: 10/25/2020 OT Individual Time: 2505-3976 OT Individual Time Calculation (min): 75 min    Short Term Goals: Week 5:  OT Short Term Goal 1 (Week 5): STGs= LTGs due to ELOS  Skilled Therapeutic Interventions/Progress Updates:    Pt in bed to start session.  He was able to transfer to sitting EOB with close supervision with work on donning his shoes with mod assist. He then completed squat pivot transfer to the wheelchair on the left with min assist.  Emphasis on slow controlled forward trunk flexion for greater efficiency with scooting.  He was taken to the therapy gym where he completed 7 mins of slow controlled peddling using the LUE and RPMs maintained at 9.  Had him focus on maintaining visual fixation on his hand while trying to peddle with it.  Transitioned from the ergonometer to the therapy mat with mod assist and worked on transitions in supine with rolling side to side in controlled manner as well as for sidelying to sitting and sit to sidelying with overall min assist.  He continues to use extensor patterns in his trunk and LEs to assist movement. Worked on incorporating more flexor patterns instead.  Finished with work on functional reach with the LUE to pick up up cones and styrofoam cups without crushing them.  Increased difficulty with coordination noted when attempting task secondary to sensory feedback dysfunction.  He would squeeze the cups too hard.  Returned to the wheelchair at [facility] assist level and then returned to the room.  Pt left up in the wheelchair with the call button and phone in reach and safety belt in place.    Therapy Documentation Precautions:  Precautions Precautions: Fall Precaution Comments: left side ataxia and weakness Restrictions Weight Bearing Restrictions: No  Pain: Pain Assessment Pain Scale: Faces Pain Score: 0-No  pain ADL: See Care Tool Section for some details of mobility  Therapy/Group: Individual Therapy  Asjia Berrios OTR/L 10/25/2020, 3:39 PM

## 2020-10-25 NOTE — Progress Notes (Signed)
[place] PHYSICAL MEDICINE & REHABILITATION PROGRESS NOTE   Subjective/Complaints: Patient seen laying in bed this morning.  Later seen, ambulating with therapies also.  He states he slept well overnight.  He states he had a good weekend, and was happy that he was able to watch some football where his [place] won.  ROS: Denies CP, SOB, N/V/D  Objective:   No results found. No results for input(s): WBC, HGB, HCT, PLT in the last 72 hours. No results for input(s): NA, K, CL, CO2, GLUCOSE, BUN, CREATININE, CALCIUM in the last 72 hours.  Intake/Output Summary (Last 24 hours) at 10/25/2020 1106 Last data filed at 10/25/2020 0204 Gross per 24 hour  Intake 240 ml  Output 500 ml  Net -260 ml    Physical Exam: Vital Signs Blood pressure (!) 127/94, pulse (!) 59, temperature 97.7 F (36.5 C), resp. rate 18, height 5\' 7"  (1.702 m), weight 78.4 kg, SpO2 95 %.  Constitutional: No distress . Vital signs reviewed. HENT: Normocephalic.  Atraumatic. Eyes: EOMI. No discharge. Cardiovascular: No JVD.  RRR Respiratory: Normal effort.  No stridor.  Bilateral clear to auscultation. GI: Non-distended.  BS + Skin: Warm and dry.  Intact. Psych: Normal mood.  Normal behavior. Musc: No edema in extremities.  No tenderness in extremities. Neuro: Alert Dysarthria, stable Follows simple commands.   Limited awareness of deficits  Left neglect, stable Left sided apraxia and ataxia, stable Motor: RUE: 4+/5 proximal distal, stable RLE: 4-4+/5 proximal distal, stable LUE: 4+/5 proximal distal, ?  Stable LLE: Hip flexion, knee extension 4/5, ankle dorsiflexion 3/5  Left upper extremity dysmetria and apraxia, unchanged  Assessment/Plan: 1. 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  Physiatrist is providing close team supervision and 24 hour management of active medical problems listed  below.  Physiatrist and rehab team continue to assess barriers to discharge/monitor patient progress toward functional and medical goals    Medical Problem List and Plan: 1.  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  Antithrombotics: -DVT/anticoagulation: SCDs             -antiplatelet therapy: N/A 3. Pain Management: Tylenol as needed for headaches.  Controlled with meds 11/1 4. Mood: Advised emotional support             -antipsychotic agents: Seroquel 25 mg nightly DC'd 5. Neuropsych: This patient is is?  Fully capable of making decisions on his own behalf. 6. Skin/Wound Care: foam dressing, continue pressure relief. 7. Fluids/Electrolytes/Nutrition: Routine in and outs.    BMP within normal limits on 10/18, labs ordered for tomorrow 8.  Post stroke dysphagia.      Advanced to regular thins, now with intermittent supervision  Overall good po intake  Continue to advance diet as tolerated 9.  History of hypertension.    Lopressor 25 mg twice daily, decreased to 12.5 twice daily on 9/28, DC'd on 9/30  Lisinopril DC'd Vitals:   10/24/20 2005 10/25/20 0318  BP: 136/79 (!) 127/94  Pulse: 61 (!) 59  Resp: 16 18  Temp: 98.1 F (36.7 C) 97.7 F (36.5 C)  SpO2: 100% 95%   Diastolic elevated on 11/1, monitor trend 10.  Polysubstance abuse-alcohol, tobacco, cocaine abuse.  Monitor for any signs of withdrawal.    Supplement thiamine and folic acid  Counsel 11. Urinary retention/urinary  incontinence:   Periods of incontinence incontinence  PVRs improving  Will avoid medications at present given blood pressure  Continue to monitor 12.  Thrombocytosis: Resolved  Platelets 362 on 10/15 13.  Slow transit constipation  Bowel meds increased on 10/18, increased again on 10/23, increased again on 10/24  Improving overall  LOS: 40 days A FACE TO FACE EVALUATION WAS  PERFORMED  Zachary Flores Zachary Flores 10/25/2020, 11:06 AM

## 2020-10-26 ENCOUNTER — Inpatient Hospital Stay (HOSPITAL_COMMUNITY): Payer: Self-pay | Admitting: Occupational Therapy

## 2020-10-26 ENCOUNTER — Inpatient Hospital Stay (HOSPITAL_COMMUNITY): Payer: Self-pay | Admitting: Speech Pathology

## 2020-10-26 ENCOUNTER — Inpatient Hospital Stay (HOSPITAL_COMMUNITY): Payer: Self-pay

## 2020-10-26 LAB — CBC WITH DIFFERENTIAL/PLATELET
Abs Immature Granulocytes: 0.03 10*3/uL (ref 0.00–0.07)
Basophils Absolute: 0 10*3/uL (ref 0.0–0.1)
Basophils Relative: 1 %
Eosinophils Absolute: 0.1 10*3/uL (ref 0.0–0.5)
Eosinophils Relative: 2 %
HCT: 45.7 % (ref 39.0–52.0)
Hemoglobin: 14.5 g/dL (ref 13.0–17.0)
Immature Granulocytes: 1 %
Lymphocytes Relative: 32 %
Lymphs Abs: 1.8 10*3/uL (ref 0.7–4.0)
MCH: 28.4 pg (ref 26.0–34.0)
MCHC: 31.7 g/dL (ref 30.0–36.0)
MCV: 89.6 fL (ref 80.0–100.0)
Monocytes Absolute: 0.6 10*3/uL (ref 0.1–1.0)
Monocytes Relative: 10 %
Neutro Abs: 3.2 10*3/uL (ref 1.7–7.7)
Neutrophils Relative %: 54 %
Platelets: 373 10*3/uL (ref 150–400)
RBC: 5.1 MIL/uL (ref 4.22–5.81)
RDW: 12.8 % (ref 11.5–15.5)
WBC: 5.8 10*3/uL (ref 4.0–10.5)
nRBC: 0 % (ref 0.0–0.2)

## 2020-10-26 LAB — BASIC METABOLIC PANEL
Anion gap: 8 (ref 5–15)
BUN: 11 mg/dL (ref 6–20)
CO2: 30 mmol/L (ref 22–32)
Calcium: 9.6 mg/dL (ref 8.9–10.3)
Chloride: 101 mmol/L (ref 98–111)
Creatinine, Ser: 0.88 mg/dL (ref 0.61–1.24)
GFR, Estimated: >60 mL/min (ref >60)
Glucose, Bld: 98 mg/dL (ref 70–99)
Potassium: 3.7 mmol/L (ref 3.5–5.1)
Sodium: 139 mmol/L (ref 135–145)

## 2020-10-26 NOTE — Progress Notes (Signed)
[place] PHYSICAL MEDICINE & REHABILITATION PROGRESS NOTE   Subjective/Complaints: Patient seen laying in bed this morning.  He states he slept well overnight.  He states he enjoyed watching the football game last night and notes it was a good game, stating that [place] won-incorrect about details.  ROS: Denies CP, SOB, N/V/D  Objective:   No results found. Recent Labs    10/26/20 0612  WBC 5.8  HGB 14.5  HCT 45.7  PLT 373   Recent Labs    10/26/20 0612  NA 139  K 3.7  CL 101  CO2 30  GLUCOSE 98  BUN 11  CREATININE 0.88  CALCIUM 9.6    Intake/Output Summary (Last 24 hours) at 10/26/2020 1037 Last data filed at 10/26/2020 0704 Gross per 24 hour  Intake 357 ml  Output 400 ml  Net -43 ml    Physical Exam: Vital Signs Blood pressure 122/80, pulse 60, temperature 98 F (36.7 C), temperature source Oral, resp. rate 18, height 5\' 7"  (1.702 m), weight 78.4 kg, SpO2 100 %.  Constitutional: No distress . Vital signs reviewed. HENT: Normocephalic.  Atraumatic. Eyes: EOMI. No discharge. Cardiovascular: No JVD.  RRR. Respiratory: Normal effort.  No stridor.  Bilateral clear to auscultation. GI: Non-distended.  BS +. Skin: Warm and dry.  Intact. Psych: Normal mood.  Normal behavior. Musc: No edema in extremities.  No tenderness in extremities. Neuro: Alert Dysarthria, unchanged Follows simple commands.   Limited awareness of deficits  Left neglect, stable Left sided apraxia and ataxia, stable Motor: RUE: 4+/5 proximal distal, stable RLE: 4-4+/5 proximal distal, stable LUE: 4+/5 proximal distal, unchanged LLE: Hip flexion, knee extension 4/5, ankle dorsiflexion 3/5  Left upper extremity dysmetria and apraxia, unchanged  Assessment/Plan: 1. 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  Physiatrist is providing close team supervision and 24 hour  management of active medical problems listed below.  Physiatrist and rehab team continue to assess barriers to discharge/monitor patient progress toward functional and medical goals    Medical Problem List and Plan: 1.  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  Antithrombotics: -DVT/anticoagulation: SCDs             -antiplatelet therapy: N/A 3. Pain Management: Tylenol as needed for headaches.  Controlled with meds 11/2  4. Mood: Advised emotional support             -antipsychotic agents: Seroquel 25 mg nightly DC'd 5. Neuropsych: This patient is is?  Fully capable of making decisions on his own behalf. 6. Skin/Wound Care: foam dressing, continue pressure relief. 7. Fluids/Electrolytes/Nutrition: Routine in and outs.    BMP within normal limits on 11/2 8.  Post stroke dysphagia.      Advanced to regular thins, now with intermittent supervision  Overall good po intake  Continue to advance diet as tolerated 9.  History of hypertension.    Lopressor 25 mg twice daily, decreased to 12.5 twice daily on 9/28, DC'd on 9/30  Lisinopril DC'd Vitals:   10/25/20 1916 10/26/20 0350  BP: 118/76 122/80  Pulse: 64 60  Resp: 18 18  Temp: 98.2 F (36.8 C) 98 F (36.7 C)  SpO2: 95% 100%   Controlled on 11/2 10.  Polysubstance abuse-alcohol, tobacco, cocaine abuse.  Monitor for any signs of withdrawal.    Supplement  thiamine and folic acid  Counsel 11. Urinary retention/urinary incontinence:   Periods of incontinence incontinence  PVRs improved  Will avoid medications at present given blood pressure  Continue to monitor 12.  Thrombocytosis: Resolved  Platelets 362 on 10/15 13.  Slow transit constipation  Bowel meds increased on 10/18, increased again on 10/23, increased again on 10/24  Improved with meds  LOS: 41 days A FACE TO FACE EVALUATION WAS PERFORMED  Zachary Flores  Zachary Flores 10/26/2020, 10:37 AM

## 2020-10-26 NOTE — Progress Notes (Signed)
Patient ID: Zachary Flores, male   DOB: 1966-05-04, 54 y.o.   MRN: [id number]  Have reached out to management again, awaiting response. Pt's sister-[name] aware and continues to work on [employer] and [employer]

## 2020-10-26 NOTE — Progress Notes (Signed)
Speech Language Pathology Daily Session Note  Patient Details  Name: Zachary Flores MRN: [id number] Date of Birth: May 26, 1966  Today's Date: 10/26/2020 SLP Individual Time: 0731-0800 SLP Individual Time Calculation (min): 29 min  Short Term Goals: Week 6: SLP Short Term Goal 1 (Week 6): STG=LTG due to remaining length of stay (pt is pending placement at SNF)  Skilled Therapeutic Interventions: Pt was seen for skilled ST targeting speech goals. SLP facilitated session with a sentence level picture description tasks in addition to informal conversation to assess carryover of intelligibility strategies. SLP advised pt to focus on slow rate, given his difficulty with self monitoring and implementing multiple strategies at once. He used slow rate to increase his intelligibility to ~85-90% with Min faded to Supervision A verbal cues. Pt left laying in bed with alarm set and needs within reach.        Pain Pain Assessment Pain Scale: 0-10 Pain Score: 0-No pain  Therapy/Group: Individual Therapy  [name] 10/26/2020, 9:50 AM

## 2020-10-26 NOTE — Progress Notes (Signed)
Occupational Therapy Weekly Progress Note  Patient Details  Name: Zachary Flores MRN: [id number] Date of Birth: 1966/05/18  Beginning of progress report period: October 18, 2020 End of progress report period: October 26, 2020  Today's Date: 10/26/2020 OT Individual Time: 1415-1530 OT Individual Time Calculation (min): 75 min   Pts short term goals equal long term goals due to ELOS.  Pt is progressing towards LTGs including bathing, toileting, and dressing.  Pts performance still does fluctuate some due to ataxia of LUE/LLE and impaired cognition.  Pt has been able to complete toileting with min assist and bathing with CGA at TTB level, however sometimes pt needs slightly more assist due to ataxia.  Pt is using LUE as gross assist with more control; fine motor control still significantly impaired.  Pt exhibits extensor patterns in hand primarily, working on increasing flexor patterns.  Pt also exhibits some improvement in functional transfers however inconsistent at times with increased ataxia and impaired cognition needing regular step by step VCs.   Patient continues to demonstrate the following deficits: muscle weakness, impaired timing and sequencing, unbalanced muscle activation, motor apraxia, ataxia, decreased coordination and decreased motor planning, decreased attention, decreased awareness, decreased problem solving, decreased safety awareness and decreased memory and decreased sitting balance, decreased standing balance and decreased balance strategies and therefore will continue to benefit from skilled OT intervention to enhance overall performance with BADL.  Patient progressing toward long term goals..  Continue plan of care.  OT Short Term Goals Week 6:   OT Short Term Goal 2 (Week 6): STGs=LTGs due to ELOS  Skilled Therapeutic Interventions/Progress Updates:    Pt supine in bed, agreeable to OT session, no c/o pain.  Pt requesting to toilet.  Pt completed supine to sit with  supervision.  Pt attempting to squat pivot with right arm rest down needing mod assist to complete transfer safely.  Pt propelled to toilet needing mod assist due to impaired gross motor coordination without shoes donned/decreased traction.  Pt completed stand pivot transfer to 3 in 1 commode using grab bars with min assist and max VCs.  Pt completed toileting with min assist.  Pt had continent episode of bowel.  Stand pivot back to w/c with min assist.  Pt completed oral hygiene and UB dressing with setup at sinkside.  Bathed UB and LB with CGA.  Pt donned brief with min assist to manage tabs only.  Pt shaved with supervision sitting sinkside.  Pt then participated in fine motor coordination activity with resistive clothespins using LUE and grasp and pinch needing RUE as gross assist.  Pt completed weighted grasp and wrist extension/flexion needing frequent VCs to attend to left hand and maintain grip due to extensor pattern in digits.  Stand pivot w/c to EOB with min assist, sit to supine with supervision.  Call bell in reach, bed alarm on.  Therapy Documentation Precautions:  Precautions Precautions: Fall Precaution Comments: left side ataxia and weakness Restrictions Weight Bearing Restrictions: No   Therapy/Group: Individual Therapy  Amie Critchley 10/26/2020, 1:01 PM

## 2020-10-26 NOTE — Progress Notes (Signed)
Physical Therapy Session Note  Patient Details  Name: Zachary Flores MRN: [id number] Date of Birth: 10/09/1966  Today's Date: 10/26/2020 PT Individual Time: 0900-1000 + 1115-1200 PT Individual Time Calculation (min): 60 min  + 45 min  Short Term Goals: Week 5:  PT Short Term Goal 1 (Week 5): STG = LTG due to ELOS  Skilled Therapeutic Interventions/Progress Updates:     1st session: Pt received supine in bed, awake and agreeable to PT session without c/o pain. Pt with saturated brief on arrival. Required totalA for removing and donning new brief. Provided soaped washcloths where he was able to perform frontal pericare with setupA, required totalA for his backside while he lay in L sidelying. TED's and shoes donned with totalA for time management, required modA for scrub pants. Supine<>sit with supervision HOB flat and no bed rail, effortful but able to complete with cues. Squat<>pivot transfer with minA from EOB to w/c, towards L side. Cues for head/hip and general sequencing. Wheeled sinkside, as pt requesting to perform oral care. He performed this with setupA, primarily using RUE. W/c transport from his room to gym for time management purposes. Focused remainder of session on functional transfers to promote indep. Performed repeated squat<>pivot transfers fluctuating between min and modA from w/c to mat table. Also performed thorough education on w/c management with blocked practice emphasis on leg rest management, brakes, and arm rest management. He also performed repeated lateral scoots along mat table L<>R directions requiring minA for hip facilitation. Pt with significant extension bias during these transfers and lateral scoots, requires multi-modal cueing for forward weight shift and general technique. Limited butt clearance from mat table while performing these. Squat<>pivot with min/modA back to w/c and returned to his room with totalA in w/c at end of session. He remained seated in w/c with  safety belt alarm on and needs in reach.  2nd session: Pt received seated in w/c awake and agreeable to PT session without c/o pain. W/c transport from his room to ortho gym. Sit<>stand with minA to Dynavision board without BUE support. Performed Dynavision with bias towards L side and instructed to use LUE only. Required minA guard while completing this and noted increased L foot inversion when reaching outside BOS, required frequent reminders for correcting; question proprioceptive feedback in R ankle and may consider R ankle brace if this persists. Reaction time for Dyanvision 6.25sec in LUQ and 7.54sec in LLQ. Performed repeated squat<>pivot transfers with min/modA from w/c to mat table with similar techniques as listed above. He required reminders again for w/c management (removing arm rest and locking BOTH brakes). Also performed repeated sit<>stands with minA from lowered mat table, cues for forward weight shift, forward lean, and monitoring L foot placement (tendency to kick back). Squat<>pivot transfer back to his w/c and transported back to his room. Squat<>pivot with min/modA back to EOB and sit>supine with supervision. He remained semi-reclined in bed with needs in reach, bed alarm on.   Therapy Documentation Precautions:  Precautions Precautions: Fall Precaution Comments: left side ataxia and weakness Restrictions Weight Bearing Restrictions: No  Therapy/Group: Individual Therapy  Zachary Flores PT 10/26/2020, 7:50 AM

## 2020-10-27 ENCOUNTER — Inpatient Hospital Stay (HOSPITAL_COMMUNITY): Payer: Self-pay

## 2020-10-27 ENCOUNTER — Inpatient Hospital Stay (HOSPITAL_COMMUNITY): Payer: Self-pay | Admitting: Occupational Therapy

## 2020-10-27 NOTE — Progress Notes (Signed)
Speech Language Pathology Weekly Progress and Session Note  Patient Details  Name: Zachary Flores MRN: [id number] Date of Birth: December 22, 1966  Beginning of progress report period: October 21, 2020 End of progress report period: October 28, 2020  Today's Date: 10/28/2020 SLP Individual Time: 8502-7741 SLP Individual Time Calculation (min): 27 min  Short Term Goals: Week 6: SLP Short Term Goal 1 (Week 6): STG=LTG due to remaining length of stay (pt is pending placement at SNF)    New Short Term Goals: Week 7: SLP Short Term Goal 1 (Week 7): STG=LTG due to remaining length of stay; pending SNF placement  Weekly Progress Updates: Pt has made slow but functional gains and has met all swallowing goals. He is progressing toward meeting his other long term goals related to cognition and dysarthria. Pt is currently ~Min assist for basic tasks due to moderate dysarthria with decreased awareness and self-monitoring, problem solving, and attention. Pt is consuming regular texture diet with thin liquids and Mod I for use of safe swallow strategies. Pt has demonstrated improved safety awareness and basic familiar problem solving over the last reporting period. Pt education is ongoing; no family has been present for ST sessions. Pt would continue to benefit from skilled ST while inpatient in order to maximize functional independence and reduce burden of care prior to discharge. Pt will need 24/7 supervision at discharge in addition to [place] follow up at next level of care - SNF is recommended and anticipated. If pt is to return home after SNF, further ST education will be required for pt and family.     Intensity: Minumum of 1-2 x/day, 30 to 90 minutes Frequency: 3 to 5 out of 7 days Duration/Length of Stay: TBD; pending SNF placement Treatment/Interventions: Cognitive remediation/compensation;Internal/external aids;Functional tasks;Patient/family education;Speech/Language facilitation;Therapeutic  Activities;Dysphagia/aspiration precaution training;Cueing hierarchy   Daily Session  Skilled Therapeutic Interventions: Pt was seen for skilled ST targeting speech intelligibility goals. Pt still required overall Min A verbal cues for intentional use of slow rate and overarticulation during sentence level picture description and conversation level tasks. His speech was ~85% intelligible today, with more articulatory imprecision noted, particularly with 3+ syllable words. He seems to have hit a plateau in ability to increase independence with use of his clear speech strategies at this time, likely due to his continued deficits in awareness and self-monitoring. Pt left sitting in wheelchair with alarm set and needs within reach. Continue per current plan of care.        Pain Pain Assessment Pain Scale: 0-10 Pain Score: 0-No pain  Therapy/Group: Individual Therapy  Zachary Flores 10/28/2020, 12:04 PM

## 2020-10-27 NOTE — Progress Notes (Signed)
Patient ID: Zachary Flores, male   DOB: 01-19-1966, 54 y.o.   MRN: [id number]  Spoke with [name]-sister via telephone to inform of team conference upgraded diet to regular and able to be continent now. He still requires 24 hr physical care-mod assist level for mobility and transfers. She continues to work on [employer] and [employer] has a phone interview this week for [employer]. Pt is aware of the plan and continues to work in therapies.

## 2020-10-27 NOTE — Progress Notes (Signed)
Speech Language Pathology Daily Session Note  Patient Details  Name: Zachary Flores MRN: [id number] Date of Birth: [birth year]  Today's Date: 10/27/2020 SLP Individual Time: 1302-1330 SLP Individual Time Calculation (min): 28 min  Short Term Goals: Week 6: SLP Short Term Goal 1 (Week 6): STG=LTG due to remaining length of stay (pt is pending placement at SNF)  Skilled Therapeutic Interventions:Skilled ST services focused on speech skills. Pt demonstrated recall of speech strategies, slow rate and form thought, mod I. SLP facilitated use of speech strategies in structured picture description task, pt demonstrated 90% intelligibility at sentence level when he used his strategies, however required min A verbal cues to utilize strategies. Pt demonstrated 80% intelligibility in conversation.  Pt was left in room with call bell within reach and chair alarm set. SLP recommends to continue skilled services.     Pain Pain Assessment Pain Score: 0-No pain  Therapy/Group: Individual Therapy  Marche Hottenstein  [facility] 10/27/2020, 3:54 PM

## 2020-10-27 NOTE — Progress Notes (Signed)
Occupational Therapy Session Note  Patient Details  Name: Zachary Flores MRN: [id number] Date of Birth: November 09, 1966  Today's Date: 10/27/2020 OT Individual Time: 1125-1200 ; and 8295-6213 OT Individual Time Calculation (min): 35 min ; and 62 min   Short Term Goals: Week 6:  OT Short Term Goal 1 (Week 6): Pt will complete toileting with min assist OT Short Term Goal 2 (Week 6): Pt will complete toilet transfer with CGA OT Short Term Goal 3 (Week 6): Pt will complete LB dressing with CGA  Skilled Therapeutic Interventions/Progress Updates:    First session: Pt sitting up in w/c, no c/o pain, requesting to go to gym to wake up some.  Pt self propelled using BLE needing max VCs and intermittent TCs to facilitate slowed pacing of propulsion and min assist and max VCs to turn and avoid obstacles.  Pt participated in tabletop neuromuscular re-education left hand with resistive component to increase proprioceptive input during gross grip, pinch, and finger flexion isolation.  Pt needing max VCs to stay on task, follow commands, and problem solving.  Pt exhibiting difficulty with finger flexion with excessive finger extension despite cues, however intermittently able to produce desired motor output.  Pt transported back to room for time management. Call bell in reach, seat belt alarm on.    Second Session:  Pt sitting up in w/c, reporting he feels tired, no c/o pain, requesting to toilet.  Pt propelled self using BLE with min assist to avoid obstacles.  Pt completed stand pivot w/c<>3 in 1 commode using grab bar needing min assist and mod VCs and TCs for safe hand/foot placement and body mechanics. Toileting completed with min assist with mod VCs for sequencing.  Pt washed hands sitting sinkside with setup.    Pt transported to ortho gym and completed block practice squat pivot transfers needing min/mod VCs and min assist.  Pt prematurely sitting and landing on wheel EOM to w/c, educated pt on approaching  EOM more to increase clearance over wheel to protect skin.  Pt with difficulty completing lateral scoots despite visual demo and VCs and needing min assist to shift weight to left and right.  Pt participated in dynamic sitting, functional grasp and release task sitting EOM with VCs and hand over hand assist to increase flexion of left digits to improve functional grasp. Pt attempted Nustep BUE/BLE with mod resistance to increase proprioceptive input, needing max assist to maintain left foot on pedal, and left hand maintaining grasp to handle due to extensor patterns and Ataxia.  Pt transferred back to w/c with min assist and mod VCs.    Pt self propelled back to room with min assist to avoid obstacles on right due to tendency to propel slightly to right, however improved pacing and control of BLE demonstrated.  Pt completed squat pivot w/c to EOB with min assist.  Sit to supine with supervision.  Doffed shoes and pants with supervision.  Pt provided with resistive foam figure to squeeze with left hand x 20 reps to facilitate increased flexion patterns while also providing TCs to facilitate simultaneous wrist extension for improved functional grasp.  Call bell in reach, bed alarm on.  Therapy Documentation Precautions:  Precautions Precautions: Fall Precaution Comments: left side ataxia and weakness Restrictions Weight Bearing Restrictions: No   Therapy/Group: Individual Therapy  Amie Critchley 10/27/2020, 12:40 PM

## 2020-10-27 NOTE — Progress Notes (Signed)
Physical Therapy Session Note  Patient Details  Name: Zachary Flores MRN: [id number] Date of Birth: 1966-02-08  Today's Date: 10/27/2020 PT Individual Time: 0800-0912 PT Individual Time Calculation (min): 72 min   Short Term Goals: Week 5:  PT Short Term Goal 1 (Week 5): STG = LTG due to ELOS  Skilled Therapeutic Interventions/Progress Updates:    Patient received supine in bed, agreeable to PT. He denies pain. MinA needed to roll to don brief. ModA for lower body clothing management supine in bed. Patient able to come to sitting edge of bed with CGA. ModA squat pivot to wc. He was able to brush his teeth at the sink using appropriate compensatory strategies, but verbal cues needed to engage L UE appropriately. Patient propelling himself in wc using B LE to therapy gym. Very short LE stride length noted when propelling wc and patient continues to use only his heels resulting in very inefficient strokes. Blocked practice squat pivot to/from therapy mat. Patient able to verbalize "checklist" for safe transfer with Min cuing for accuracy: lock brakes, wc at angle to mat, arm rest removed, UE placement, and head movement/weight shift to ensure safe transfer. Patient completing dynamic sitting task using L UE to manipulate cones and pegs. 1.5# ankle weight applied to L UE for improved proprioceptive input with mild improvement in coordination noted. Patient with good carryover from blocked transfer practice to transfer back to wc. Figure 8 wc mobility completed using B LE. MinA needed for proper steering and verbal cues to engage entire foot and not just heel. Patient able to propel himself back to his room with MinA. Seat cushion replaced in wc via sit <>stand with MinA. Seatbelt alarm on, call light within reach.   Therapy Documentation Precautions:  Precautions Precautions: Fall Precaution Comments: left side ataxia and weakness Restrictions Weight Bearing Restrictions: No    Therapy/Group:  Individual Therapy  Elizebeth Koller, PT, DPT, CBIS 10/27/2020, 7:45 AM

## 2020-10-27 NOTE — Patient Care Conference (Signed)
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 LOG?)  Team Members Present: Care Coodinator Present: Dorien Chihuahua, RN, BSN, CRRN;Becky Dupree, LCSW Nurse Present:  Demetrios Loll, RN) PT Present: Estevan Ryder, PT OT Present: Leretha Pol, OT SLP Present: Jettie Booze, CF-SLP PPS Coordinator present : Ileana Ladd, Burna Mortimer, SLP     Current Status/Progress Goal Weekly Team Focus  Bowel/Bladder   Pt continent of B/B. LBM 10/26/2020  Pt to remain continent of B/B  Assess B/B every shift and PRN   Swallow/Nutrition/ Hydration   regular textures, thin liquids, intermittent supervision  Supervision  swallow goals met   ADL's   supervision/setup UB ADLs, minA/modA LB dressing/bathing/toileting, min/modA functional transfers  CGA to minA  self care training, NMR LUE/LLE, transfer training, [place] LUE   Mobility   supervision bed mobility, min/modA stand<>pivot transfers. Gait fluctuates, has been able to ambulate 168ft with modA +2 via hand held assist. W/c mobility 174ft with minA   min/modA goals.  w/c propulsion, functional transfers, gait progressions, BLE/LUE coordination training, safety awareness. DC planning?   Communication   Supervision-Min depending on cognitive demand of task while speaking, ~90% intelligible, fluctuates with fatigue  Supervision  carryover speech intelligibility strategies, self monitoring   Safety/Cognition/ Behavioral Observations  Supervision-Mod A depending on complexity of task, emergent awareness most imapired  Supervision-Min  semi-complex problem solving, error awareness   Pain   Pt consistently denies pain  Pain to remain a 0/10  Assess pain every shift and PRN   Skin   Skin intact  no skin breakdown  Assess skin every shift and PRN     Discharge Planning:  Continue to search for NH bed, reached out to higher management and awaiting response.   Team Discussion: Medically stable; continue to note poor awareness and slow progress. Patient is motivated however team recommended decrease frequency of therapy. Discussed ability to reach independence with wheelchair level for discharge and team feels that the patient will not be able to meet that level.  Patient on target to meet rehab goals: yes  *See Care Plan and progress notes for long and short-term goals.   Revisions to Treatment Plan:  Decrease frequency for therapy sessions  Teaching Needs:   Current Barriers to Discharge: Decreased caregiver support, lack of insurance for SNF, access to home, behavior (previous drug use)   Possible Resolutions to Barriers: SW seeking SNF with Letter of guarantee    Medical Summary Current Status: Left side hemiparesis and slurred speech secondary to central pontine hemorrhage in the setting  of hypertension and cocaine use  Barriers to Discharge: Medical stability;Decreased family/caregiver support;Other (comments)  Barriers to Discharge Comments: hx of substance abuse for SNF placement Possible Resolutions to Barriers/Weekly Focus:  Therapies, contintue to adjust bowel meds as necessary, monitor BP, continue to advance diet as tolerated   Continued Need for Acute Rehabilitation Level of Care: The patient requires daily medical management by a physician with specialized training in physical medicine and rehabilitation for the following reasons: Direction of a multidisciplinary physical rehabilitation program to maximize functional independence : Yes Medical management of patient stability for increased activity during participation in an intensive rehabilitation regime.: Yes Analysis of laboratory values and/or radiology reports with any subsequent need for medication adjustment and/or medical intervention. : Yes   I attest that I was present, lead the team conference, and concur with the assessment and plan of the team.   Dorien Chihuahua B 10/27/2020, 2:00 PM

## 2020-10-27 NOTE — Progress Notes (Signed)
[place] PHYSICAL MEDICINE & REHABILITATION PROGRESS NOTE   Subjective/Complaints: Patient seen sitting up, working with therapies this AM.  He states he slept well overnight. Fair sitting balance noted.   ROS: Denies CP, SOB, N/V/D  Objective:   No results found. Recent Labs    10/26/20 0612  WBC 5.8  HGB 14.5  HCT 45.7  PLT 373   Recent Labs    10/26/20 0612  NA 139  K 3.7  CL 101  CO2 30  GLUCOSE 98  BUN 11  CREATININE 0.88  CALCIUM 9.6    Intake/Output Summary (Last 24 hours) at 10/27/2020 1140 Last data filed at 10/27/2020 0817 Gross per 24 hour  Intake 660 ml  Output 700 ml  Net -40 ml    Physical Exam: Vital Signs Blood pressure (!) 141/83, pulse (!) 58, temperature 98.4 F (36.9 C), resp. rate 17, height 5\' 7"  (1.702 m), weight 78.4 kg, SpO2 99 %.  Constitutional: No distress . Vital signs reviewed. HENT: Normocephalic.  Atraumatic. Eyes: EOMI. No discharge. Cardiovascular: No JVD.  RRR. Respiratory: Normal effort.  No stridor.  Bilateral clear to auscultation. GI: Non-distended.  BS +. Skin: Warm and dry.  Intact. Psych: Normal mood.  Normal behavior. Musc: No edema in extremities.  No tenderness in extremities. Neuro: Alert Dysarthria, stable Follows simple commands.   Limited awareness of deficits  Left neglect, stable Left sided apraxia and ataxia, stable Motor: RUE: 4+/5 proximal distal, stable RLE: 4-4+/5 proximal distal, stable LUE: 4+/5 proximal distal, unchanged LLE: Hip flexion, knee extension 4/5, ankle dorsiflexion 3/5  Left upper extremity dysmetria and apraxia, unchanged  Assessment/Plan: 1. 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  Physiatrist is providing close team supervision and 24 hour management of active medical problems listed below.  Physiatrist and rehab team continue to assess barriers to  discharge/monitor patient progress toward functional and medical goals    Medical Problem List and Plan: 1.  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 Please see conference note from today as well.  2.  Antithrombotics: -DVT/anticoagulation: SCDs             -antiplatelet therapy: N/A 3. Pain Management: Tylenol as needed for headaches.  Controlled with meds 11/3 4. Mood: Advised emotional support             -antipsychotic agents: Seroquel 25 mg nightly DC'd 5. Neuropsych: This patient is is?  Fully capable of making decisions on his own behalf. 6. Skin/Wound Care: foam dressing, continue pressure relief. 7. Fluids/Electrolytes/Nutrition: Routine in and outs.    BMP within normal limits on 11/2 8.  Post stroke dysphagia.      Advanced to regular thins, now with intermittent supervision  Overall good po intake  Continue to advance diet as tolerated 9.  History of hypertension.    Lopressor 25 mg twice daily, decreased to 12.5 twice daily on 9/28, DC'd on 9/30  Lisinopril DC'd Vitals:   10/26/20 1943 10/27/20 0306  BP: 116/75 (!) 141/83  Pulse: 71 (!) 58  Resp: 16 17  Temp: 97.9 F (36.6 C) 98.4 F (36.9 C)  SpO2: 100% 99%   Slightly labile on  11/3 10.  Polysubstance abuse-alcohol, tobacco, cocaine abuse.  Monitor for any signs of withdrawal.    Supplement thiamine and folic acid  Counsel 11. Urinary retention/urinary incontinence:   Periods of incontinence incontinence  PVRs improved  Will avoid medications at present given blood pressure  Continue to monitor 12.  Thrombocytosis: Resolved  Platelets 362 on 10/15 13.  Slow transit constipation  Bowel meds increased on 10/18, increased  again on 10/23, increased again on 10/24  Overall improving  LOS: 42 days A FACE TO FACE EVALUATION WAS PERFORMED  Helaine Yackel [name] 10/27/2020, 11:40 AM

## 2020-10-28 ENCOUNTER — Inpatient Hospital Stay (HOSPITAL_COMMUNITY): Payer: Self-pay | Admitting: Occupational Therapy

## 2020-10-28 ENCOUNTER — Inpatient Hospital Stay (HOSPITAL_COMMUNITY): Payer: Self-pay

## 2020-10-28 ENCOUNTER — Inpatient Hospital Stay (HOSPITAL_COMMUNITY): Payer: Self-pay | Admitting: Speech Pathology

## 2020-10-28 NOTE — Progress Notes (Signed)
[place] PHYSICAL MEDICINE & REHABILITATION PROGRESS NOTE   Subjective/Complaints: Patient seen laying in bed this AM.  He states he slept well overnight.  He states he ate all of his breakfast and it was good.   ROS: Denies CP, SOB, N/V/D  Objective:   No results found. Recent Labs    10/26/20 0612  WBC 5.8  HGB 14.5  HCT 45.7  PLT 373   Recent Labs    10/26/20 0612  NA 139  K 3.7  CL 101  CO2 30  GLUCOSE 98  BUN 11  CREATININE 0.88  CALCIUM 9.6    Intake/Output Summary (Last 24 hours) at 10/28/2020 1652 Last data filed at 10/28/2020 1609 Gross per 24 hour  Intake 720 ml  Output 1650 ml  Net -930 ml    Physical Exam: Vital Signs Blood pressure (!) 105/58, pulse 64, temperature 98.1 F (36.7 C), resp. rate 17, height 5\' 7"  (1.702 m), weight 78.4 kg, SpO2 95 %.  Constitutional: No distress . Vital signs reviewed. HENT: Normocephalic.  Atraumatic. Eyes: EOMI. No discharge. Cardiovascular: No JVD.  RRR. Respiratory: Normal effort.  No stridor.  Bilateral clear to auscultation. GI: Non-distended.  BS +. Skin: Warm and dry.  Intact. Psych: Normal mood.  Normal behavior. Musc: No edema in extremities.  No tenderness in extremities. Neuro: Alert Dysarthria, stable Follows simple commands.   Limited awareness of deficits  Left neglect, stable Left sided apraxia and ataxia, stable Motor: RUE: 4+/5 proximal distal, stable RLE: 4-4+/5 proximal distal, stable LUE: 4+/5 proximal distal, stable LLE: Hip flexion, knee extension 4/5, ankle dorsiflexion 3/5, stable Left upper extremity dysmetria and apraxia, unchanged  Assessment/Plan: 1. 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  Physiatrist is providing close team supervision and 24 hour management of active medical problems listed below.  Physiatrist and rehab team continue to assess barriers to  discharge/monitor patient progress toward functional and medical goals    Medical Problem List and Plan: 1.  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  Antithrombotics: -DVT/anticoagulation: SCDs             -antiplatelet therapy: N/A 3. Pain Management: Tylenol as needed for headaches.  Controlled with meds 11/4 4. Mood: Advised emotional support             -antipsychotic agents: Seroquel 25 mg nightly DC'd 5. Neuropsych: This patient is is?  Fully capable of making decisions on his own behalf. 6. Skin/Wound Care: foam dressing, continue pressure relief. 7. Fluids/Electrolytes/Nutrition: Routine in and outs.    BMP within normal limits on 11/2 8.  Post stroke dysphagia.      Advanced to regular thins, now with intermittent supervision  Overall good po intake  Continue to advance diet as tolerated 9.  History of hypertension.    Lopressor 25 mg twice daily, decreased to 12.5 twice daily on 9/28, DC'd on 9/30  Lisinopril DC'd Vitals:   10/27/20 1946 10/28/20 1514  BP: 135/72 (!) 105/58  Pulse: 66 64  Resp: 16 17  Temp: 97.9 F (36.6 C) 98.1 F (36.7 C)  SpO2: 95% 95%   Slightly labile on 11/4 10.  Polysubstance abuse-alcohol, tobacco, cocaine abuse.  Monitor for any signs of withdrawal.    Supplement thiamine and folic acid  Counsel 11. Urinary retention/urinary incontinence:  Periods of incontinence incontinence  PVRs improved  Will avoid medications at present given blood pressure  Continue to monitor 12.  Thrombocytosis: Resolved  Platelets 362 on 10/15 13.  Slow transit constipation  Bowel meds increased on 10/18, increased again on 10/23, increased again on 10/24  Improving  LOS: 43 days A FACE TO FACE EVALUATION WAS PERFORMED  Zachary Flores [name] 10/28/2020, 4:52 PM

## 2020-10-28 NOTE — Progress Notes (Signed)
Physical Therapy Weekly Progress Note  Patient Details  Name: Zachary Flores MRN: [id number] Date of Birth: [birth year]  Beginning of progress report period: October 19, 2020 End of progress report period: October 28, 2020  Today's Date: 10/28/2020 PT Individual Time: 8366-2947 + 6546-5035 PT Individual Time Calculation (min): 58 min + 28 min  Patient has met 3 of 10 long term goals.  Pt continuing to make slow progress towards goals. He is able to complete bed mobility with supervision (effortful), stand<>pivot transfers with modA (for balance), squat<>pivot transfers with min/modA (for hip facilitation and sequencing), can propel himself ~118f with minA in w/c using BLE's only, and can ambulate up to 772fwith modA +2 via hand held assist. He continues to be primarily limited by significant BLE ataxia, LUE ataxia, impaired safety/judgement, decreased memory, poor insight into deficits, impaired spatial orientation, motor apraxia, and sensory deficits. Anticipate patient will need strict 24/7 S/A at discharge due to these significant impairments, placing him at a high falls risk.  Patient continues to demonstrate the following deficits muscle weakness, impaired timing and sequencing, abnormal tone, unbalanced muscle activation, motor apraxia, ataxia, decreased coordination and decreased motor planning, decreased visual perceptual skills, decreased motor planning and ideational apraxia and decreased attention, decreased awareness, decreased problem solving, decreased safety awareness, decreased memory and delayed processing and therefore will continue to benefit from skilled PT intervention to increase functional independence with mobility.  Patient progressing toward long term goals. Continue plan of care.  PT Short Term Goals Week 6:  PT Short Term Goal 1 (Week 6): STG = LTG due to ELOS  Skilled Therapeutic Interventions/Progress Updates:   1st session:   Pt received supine in bed,  agreeable to PT session without reports of pain. Pt reports dirty brief on arrival. Required totalA for removing and donning briefs. Provided soaped washcloth where he was able to perform frontal perciare with setupA, required totalA for posterior pericare while he lay in L sidelying. Donned TED's and shoes with totalA for time management. Required modA for donning scrubs pants while he lay supine. Supine<>sit with supervision with HOB flat, use of bed rail. Performed squat<>pivot with modA from EOB to w/c, cues for setup, sequencing, and general technique. Wheeled sinkside where he performed oral care with setupA; required cues for integrating LUE for item retrieval. Pt propelled himself in hemi-height w/c, using BLE's only, requiring minA on level surfaces for both turns and avoiding obstacles. Demo's poor efficiency with stroke's 2/2 excessive heel strike while propelling. Limited carryover when cued for correcting. Focus of remainder of session functional transfer training. Performed squat<>pivot with modA from w/c to mat table. Practiced lateral scooting along EOM with minA, demo's poor hip clearance from mat. Broke down scooting into blocked practice "half stands" to promote hip clearance and then integrated "half stands + scoots." Performed repeated squat<>pivot transfers from w/c to EOB to further educate sequencing and general technique. W/c transport back to his room where he remained seated in w/c at end of session, needs in reach, safety belt alarm on.  2nd session: Pt received sitting in w/c, awake and agreeable to PT session, no reports of pain. Focus of session to continue functional transfer training. W/c transport for time management from his room to main therapy gym. Squat<>pivot with min/modA from w/c to mat table, towards R side. Performed repeated "half stands" from lowered mat table with pt's hand on therapist's shoulder (to facilitate forward weight shift) and mild L knee block. Then  incorporated hip shifts into  half stands which appeared to have good carryover, requiring minA for hip translation. Squat<>pivot with min/modA from mat table to w/c, cues for general sequencing, head/hip, and feet placement. L foot needs to be monitored during transfers due to risk of inversion and excessive knee flexion due to ataxia and impaired proprioception. W/c transport back to his room, required modA for squat<>pivot back to bed. Sit>supine with supervision with bed rail and HOB flat. He remained supine in bed at end of session with needs in reach and bed alarm on.  Therapy Documentation Precautions:  Precautions Precautions: Fall Precaution Comments: left side ataxia and weakness Restrictions Weight Bearing Restrictions: No  Therapy/Group: Individual Therapy  Daiana Vitiello P Necia Kamm PT 10/28/2020, 8:23 AM

## 2020-10-28 NOTE — Progress Notes (Signed)
Physical Therapy Session Note  Patient Details  Name: Zachary Flores MRN: [id number] Date of Birth: 1966-06-27  Today's Date: 10/28/2020 PT Individual Time: 1110-1200 PT Individual Time Calculation (min): 50 min   Short Term Goals: Week 6:  PT Short Term Goal 1 (Week 6): STG = LTG due to ELOS  Skilled Therapeutic Interventions/Progress Updates:    Patient in room in w/c. Assisted to bathroom in w/c, stand pivot mod A with total A for clothing management.  Sit to stand with grabbar and mod A with total A for hygiene.  Patient with L foot and knee flexed so sat on toilet prior to pulling up pants, then stood with mod A and total A for donning pants, then pivot to chair max cues to reach to chair with L UE.  Patient in w/c propelled with LE's x 100' with min A, then assisted to ortho gym.  Practiced transfers with pt stating steps for transfer.  Needed reminder to let caregiver know to block L LE.  Mod A for squat pivot transfers once blocked L knee and cues for head to L to go R.  Scoot-pivots down mat and then to L back around mat.  Seated EOB for reaching for and tossing horseshoes reaching to L with R UE.  Patient transferred back to w/c mod A.  UBE 2' forward and 1.5 min back stopped when pt c/o pain L shoulder over lateral deltoid, improved with rest.  Patient seated in front of mat using green physioball for B UE closed chain pushing ball forward during sit to stand with A for L LE positioning 2 x 5 reps mod A.  Patient assisted to room in w/c and left with call bell and belt alarm activated.  Therapy Documentation Precautions:  Precautions Precautions: Fall Precaution Comments: left side ataxia and weakness Restrictions Weight Bearing Restrictions: No Pain: Pain Assessment Pain Scale: 0-10 Pain Score: 0-No pain Faces Pain Scale: Hurts a little bit Pain Type: Acute pain Pain Location: Shoulder Pain Orientation: Left;Lateral Pain Descriptors / Indicators: Discomfort Pain Onset: With  Activity (using arm bike) Pain Intervention(s): Rest    Therapy/Group: Individual Therapy  [name]  Sheran Lawless, PT 10/28/2020, 12:50 PM

## 2020-10-29 ENCOUNTER — Inpatient Hospital Stay (HOSPITAL_COMMUNITY): Payer: Self-pay

## 2020-10-29 ENCOUNTER — Inpatient Hospital Stay (HOSPITAL_COMMUNITY): Payer: Medicaid Other

## 2020-10-29 ENCOUNTER — Inpatient Hospital Stay (HOSPITAL_COMMUNITY): Payer: Self-pay | Admitting: Speech Pathology

## 2020-10-29 LAB — CBC WITH DIFFERENTIAL/PLATELET
Abs Immature Granulocytes: 0.03 10*3/uL (ref 0.00–0.07)
Basophils Absolute: 0 10*3/uL (ref 0.0–0.1)
Basophils Relative: 1 %
Eosinophils Absolute: 0 10*3/uL (ref 0.0–0.5)
Eosinophils Relative: 0 %
HCT: 48.7 % (ref 39.0–52.0)
Hemoglobin: 15.7 g/dL (ref 13.0–17.0)
Immature Granulocytes: 0 %
Lymphocytes Relative: 11 %
Lymphs Abs: 0.9 10*3/uL (ref 0.7–4.0)
MCH: 28.9 pg (ref 26.0–34.0)
MCHC: 32.2 g/dL (ref 30.0–36.0)
MCV: 89.7 fL (ref 80.0–100.0)
Monocytes Absolute: 0.7 10*3/uL (ref 0.1–1.0)
Monocytes Relative: 8 %
Neutro Abs: 6.3 10*3/uL (ref 1.7–7.7)
Neutrophils Relative %: 80 %
Platelets: 424 10*3/uL — ABNORMAL HIGH (ref 150–400)
RBC: 5.43 MIL/uL (ref 4.22–5.81)
RDW: 13.2 % (ref 11.5–15.5)
WBC: 7.9 10*3/uL (ref 4.0–10.5)
nRBC: 0 % (ref 0.0–0.2)

## 2020-10-29 LAB — COMPREHENSIVE METABOLIC PANEL
ALT: 20 U/L (ref 0–44)
AST: 16 U/L (ref 15–41)
Albumin: 3.9 g/dL (ref 3.5–5.0)
Alkaline Phosphatase: 80 U/L (ref 38–126)
Anion gap: 13 (ref 5–15)
BUN: 12 mg/dL (ref 6–20)
CO2: 23 mmol/L (ref 22–32)
Calcium: 9.8 mg/dL (ref 8.9–10.3)
Chloride: 103 mmol/L (ref 98–111)
Creatinine, Ser: 1.03 mg/dL (ref 0.61–1.24)
GFR, Estimated: >60 mL/min (ref >60)
Glucose, Bld: 114 mg/dL — ABNORMAL HIGH (ref 70–99)
Potassium: 3.5 mmol/L (ref 3.5–5.1)
Sodium: 139 mmol/L (ref 135–145)
Total Bilirubin: 0.5 mg/dL (ref 0.3–1.2)
Total Protein: 7.2 g/dL (ref 6.5–8.1)

## 2020-10-29 LAB — C DIFFICILE (CDIFF) QUICK SCRN (NO PCR REFLEX)
C Diff antigen: NEGATIVE
C Diff interpretation: NOT DETECTED
C Diff toxin: NEGATIVE

## 2020-10-29 MED ORDER — LOPERAMIDE HCL 2 MG PO CAPS
2.0000 mg | ORAL_CAPSULE | ORAL | Status: DC | PRN
  Administered 2020-10-31: 2 mg via ORAL
  Filled 2020-10-29 (×2): qty 1

## 2020-10-29 MED ORDER — VANCOMYCIN HCL 750 MG/150ML IV SOLN
750.0000 mg | Freq: Three times a day (TID) | INTRAVENOUS | Status: DC
  Administered 2020-10-29 – 2020-10-30 (×2): 750 mg via INTRAVENOUS
  Filled 2020-10-29 (×3): qty 150

## 2020-10-29 MED ORDER — PIPERACILLIN-TAZOBACTAM 3.375 G IVPB
3.3750 g | Freq: Three times a day (TID) | INTRAVENOUS | Status: DC
  Administered 2020-10-29 – 2020-10-30 (×2): 3.375 g via INTRAVENOUS
  Filled 2020-10-29 (×3): qty 50

## 2020-10-29 MED ORDER — SODIUM CHLORIDE 0.9 % IV SOLN: INTRAVENOUS | Status: DC | PRN

## 2020-10-29 NOTE — Progress Notes (Signed)
[place] PHYSICAL MEDICINE & REHABILITATION PROGRESS NOTE   Subjective/Complaints: No complaints this morning. Denies pain, constipation, insomnia.   ROS: Denies CP, SOB, N/V/D  Objective:   No results found. No results for input(s): WBC, HGB, HCT, PLT in the last 72 hours. No results for input(s): NA, K, CL, CO2, GLUCOSE, BUN, CREATININE, CALCIUM in the last 72 hours.  Intake/Output Summary (Last 24 hours) at 10/29/2020 1137 Last data filed at 10/28/2020 1804 Gross per 24 hour  Intake 360 ml  Output 575 ml  Net -215 ml    Physical Exam: Vital Signs Blood pressure 127/81, pulse 98, temperature 99.5 F (37.5 C), resp. rate 18, height 5\' 7"  (1.702 m), weight 78.4 kg, SpO2 98 %.   General: Alert and oriented x 3, No apparent distress HEENT: Head is normocephalic, atraumatic, PERRLA, EOMI, sclera anicteric, oral mucosa pink and moist, dentition intact, ext ear canals clear,  Neck: Supple without JVD or lymphadenopathy Heart: Reg rate and rhythm. No murmurs rubs or gallops Chest: CTA bilaterally without wheezes, rales, or rhonchi; no distress Abdomen: Soft, non-tender, non-distended, bowel sounds positive. Extremities: No clubbing, cyanosis, or edema. Pulses are 2+  Psych: Normal mood.  Normal behavior. Musc: No edema in extremities.  No tenderness in extremities. Neuro: Alert Dysarthria, stable Follows simple commands.   Limited awareness of deficits  Left neglect, stable Left sided apraxia and ataxia, stable Motor: RUE: 4+/5 proximal distal, stable RLE: 4-4+/5 proximal distal, stable LUE: 4+/5 proximal distal, stable LLE: Hip flexion, knee extension 4/5, ankle dorsiflexion 3/5, stable Left upper extremity dysmetria and apraxia, unchanged  Assessment/Plan: 1. 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  Physiatrist is providing close team supervision and 24  hour management of active medical problems listed below.  Physiatrist and rehab team continue to assess barriers to discharge/monitor patient progress toward functional and medical goals    Medical Problem List and Plan: 1.  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  Antithrombotics: -DVT/anticoagulation: SCDs             -antiplatelet therapy: N/A 3. Pain Management: Tylenol as needed for headaches.  Controlled with meds 10/5 4. Mood: Advised emotional support             -antipsychotic agents: Seroquel 25 mg nightly DC'd 5. Neuropsych: This patient is is?  Fully capable of making decisions on his own behalf. 6. Skin/Wound Care: foam dressing, continue pressure relief. 7. Fluids/Electrolytes/Nutrition: Routine in and outs.    BMP within normal limits on 11/2 8.  Post stroke dysphagia.      Advanced to regular thins, now with intermittent supervision  Overall good po intake  Continue to advance diet as tolerated 9.  History of hypertension.    Lopressor 25 mg twice daily, decreased to 12.5 twice daily on 9/28, DC'd on 9/30  Lisinopril DC'd Vitals:   10/28/20 1938 10/29/20 0407  BP:  127/81  Pulse:  98  Resp:  18  Temp: 98.4 F (36.9 C) 99.5 F (37.5 C)  SpO2:  98%   Stable 11/5 10.  Polysubstance abuse-alcohol, tobacco, cocaine abuse.  Monitor for any signs of withdrawal.    Supplement thiamine and folic acid  Counsel 11. Urinary retention/urinary incontinence:   Periods of incontinence incontinence  PVRs improved  Will avoid medications at present given  blood pressure  Continue to monitor 12.  Thrombocytosis: Resolved  Platelets 362 on 10/15 13.  Slow transit constipation  Bowel meds increased on 10/18, increased again on 10/23, increased again on 10/24  Improving  LOS: 44 days A FACE TO FACE EVALUATION WAS PERFORMED  Drema Pry Camerin Ladouceur 10/29/2020,  11:37 AM

## 2020-10-29 NOTE — Progress Notes (Signed)
Pharmacy Antibiotic Note  Zachary Flores is a 55 y.o. male admitted on 09/15/2020 with fever, abdominal pain.  Pharmacy has been consulted for vancomycin and zosyn dosing.  Scr 0.88, est CrCl ~ 85 ml/min  Plan: Vancomycin 750 mg IV every 8 hours.  Goal trough 15-20 mcg/mL. Zosyn 3.375g IV q8h (4 hour infusion).  Height: 5\' 7"  (170.2 cm) Weight: 78.4 kg (172 lb 13.5 oz) IBW/kg (Calculated) : 66.1  Temp (24hrs), Avg:100.7 F (38.2 C), Min:99.4 F (37.4 C), Max:103.1 F (39.5 C)  Recent Labs  Lab 10/26/20 0612  WBC 5.8  CREATININE 0.88    Estimated Creatinine Clearance: 87.6 mL/min (by C-G formula based on SCr of 0.88 mg/dL).    No Known Allergies  Antimicrobials this admission: Vancomycin 11/5 >  Zosyn 11/5 >   Dose adjustments this admission:  Microbiology results: 11/5 BCx x 2 >  11/5 UCx >   Thank you for allowing pharmacy to be a part of this patient's care.   13/5, Reece Leader, BCCP Clinical Pharmacist  10/29/2020 8:20 PM   [facility] pharmacy phone numbers are listed on [email or web address]

## 2020-10-29 NOTE — Progress Notes (Signed)
Physical Therapy Session Note  Patient Details  Name: Zachary Flores MRN: [id number] Date of Birth: 1966/11/27  Today's Date: 10/29/2020 PT Individual Time: 2725-3664 + 1300-1325 PT Individual Time Calculation (min): 45 min + 25 min  Short Term Goals: Week 6:  PT Short Term Goal 1 (Week 6): STG = LTG due to ELOS  Skilled Therapeutic Interventions/Progress Updates:   1st session:   Pt received sitting in w/c, agreeable to PT session without c/o pain. Focus of session to continue w/c mobility training. He required minA for propelling with BLE's only, navigating around 141ft. He has significant difficulty producing efficient strokes with BLE's. PT provided several bouts of demonstration for producing increased bilateral knee extension to maximize strokes; limited carryover with mobility. Also performed w/c mobility with obstacle training, requiring modA for avoiding obstacles and cues throughout for widening turns and being mindful of environment, limited carryover. Also performed w/c mobility with attempts to push himself backwards rather than propel forwards. He demonstrated ability to produce strong strokes with BLEs but poor safety awareness with avoiding obstacles behind him despite max cueing. Squat<>pivot with modA back to EOB from w/c, cues for sequencing and safety and running through his "checklist" for w/c management. Supervision for sit>supine at EOB. He remained semi-reclined at end of session, needs in reach, bed alarm on.  2nd session: Pt received supine in bed with BLE's off EOB, attempting to get OOB. Pt reporting he was attempting to get to urinal that was placed on nightstand across the room. Educated patient on importance of using call bell to call out for nursing staff to assist when he needs toileting care. Pt verbalized understanding. Assisted patient back in bed. Brief saturated with both BM and urine. Required maxA for removing dirty brief and totalA for posterior pericare and  he was able to do frontal pericare with steupA using RUE. After cleaning up, he then reports need to have BM. Supine<>sit with supervision with HOB flat. Performed Stedy transfer for time management from EOB to toilet. Pt continent of bowel. NT called for hand-off of care. Pt remained seated on toilet with NT present at end of session.  Therapy Documentation Precautions:  Precautions Precautions: Fall Precaution Comments: left side ataxia and weakness Restrictions Weight Bearing Restrictions: No  Therapy/Group: Individual Therapy  Lovie Agresta P Adryan Shin PT 10/29/2020, 12:07 PM

## 2020-10-29 NOTE — Progress Notes (Signed)
Spoke with Dr. Carlis Abbott regarding patient's loose stools and ordering a C-Diff test.  Dr. Carlis Abbott said yes and to put patient on contact isolation.  Patient put on contact isolation per protocol and C-Diff test sent to the lab.

## 2020-10-29 NOTE — Progress Notes (Signed)
Phlebotomy notified of stat orders for blood cultures CBC CMP at 2013

## 2020-10-29 NOTE — Progress Notes (Signed)
Physical Therapy Session Note  Patient Details  Name: Zachary Flores MRN: [id number] Date of Birth: 27-Jun-1966  Today's Date: 10/29/2020 PT Individual Time: 1415-1457 PT Individual Time Calculation (min): 42 min   Short Term Goals: Week 4:  PT Short Term Goal 1 (Week 4): STG = LTG due to ELOS  Skilled Therapeutic Interventions/Progress Updates:    session focused on neuro re-ed for normalizing movement patterns and addressing postural control re-training, coordination, motor control, attention, and awareness during transfers, sit <> stands, and fine motor activities. Fine motor activity with bimanual task to address BUE coordination and controlled movements to thread thick beads on string and incorporate functional use of LUE. Pt requires max verbal and tactile cues to incorporate LUE consistently. Ataxic movements noted and impaired graded movement. Attempted to progress to sit <> stands with pt significant difficulty with motor planning and impulsive nature requiring mod assist with several attempts due to pt pushing back through legs and not weightshifting anteriorly. In standing forced WB through LUE and then progressed to attempt to continue bead task x 2 beads but unable to maintain attention to maintain balance (pushing to the L and decreased L knee extension but able to correct with cues about 25% of the time) to functionally perform task. End of session request to return back to bed and performed mod assist squat pivot transfer to the R with blocking of BLE due to extension and cues for technique/anterior weightshift (tactile and verbal). Returned to supine with supervision and all needs in reach.   Therapy Documentation Precautions:  Precautions Precautions: Fall Precaution Comments: left side ataxia and weakness Restrictions Weight Bearing Restrictions: No    Pain: Pain Assessment Pain Scale: 0-10 Pain Score: 0-No pain    Therapy/Group: Individual Therapy  Karolee Stamps  Darrol Poke, PT, DPT, CBIS  10/29/2020, 3:09 PM

## 2020-10-29 NOTE — Progress Notes (Signed)
Placed pt on enteric precautions pending results from lab.

## 2020-10-29 NOTE — Progress Notes (Signed)
Sent Dr. Carlis Abbott a message to inform her that patient is having loose stools and c/o mild abdominal pain.  New order for Imodium and a KUB to be done.

## 2020-10-29 NOTE — Progress Notes (Signed)
   10/29/20 2113  Assess: MEWS Score  Temp (!) 102.9 F (39.4 C)  BP 111/83  Pulse Rate (!) 113  Resp (!) 28  Level of Consciousness Alert  SpO2 97 %  O2 Device Room Air  Assess: MEWS Score  MEWS Temp 2  MEWS Systolic 0  MEWS Pulse 2  MEWS RR 2  MEWS LOC 0  MEWS Score 6  MEWS Score Color Red  Assess: if the MEWS score is Yellow or Red  Were vital signs taken at a resting state? Yes  Focused Assessment Change from prior assessment (see assessment flowsheet)  Early Detection of Sepsis Score *See Row Information* Medium  MEWS guidelines implemented *See Row Information* Yes  Treat  MEWS Interventions Escalated (See documentation below)  Pain Scale 0-10  Pain Score 0  Take Vital Signs  Increase Vital Sign Frequency  Red: Q 1hr X 4 then Q 4hr X 4, if remains red, continue Q 4hrs  Escalate  MEWS: Escalate Red: discuss with charge nurse/RN and provider, consider discussing with RRT  Notify: Charge Nurse/RN  Name of Charge Nurse/RN Notified Dois Davenport RN   Date Charge Nurse/RN Notified 10/29/20  Time Charge Nurse/RN Notified 2115  Notify: Provider  Provider Name/Title Dr. Carlis Abbott   Date Provider Notified 10/29/20  Time Provider Notified 2121  Notification Type Call  Notification Reason Change in status  Response See new orders  Date of Provider Response 10/29/20  Time of Provider Response 2122  Notify: Rapid Response  Name of Rapid Response RN Notified Paulo Fruit check at 21:13 vitals elevated to red mews criteria Charge nurse Caralyn Guile notified at  21:15  Rapid Response nurse david notified at 21:16 Dr. Carlis Abbott notified at 21:21 Catheterized  Urine specimen collected, and sent to lab for culture. Will follow red mews protocol for vital signs q1hr x 4 hours x4 and Q4 hour vitals signs x 4

## 2020-10-29 NOTE — Progress Notes (Signed)
Patient ID: Zachary Flores, male   DOB: [birth year], 54 y.o.   MRN: [id number] Marthe Patch from management who report they can not assist due to NH shortages and pt's substance abuse issues. Have reached out to [name]-sister and left a message for her, will need to re-group and work on option of home which it seems is the only option he has at this point. Have re-sent to [facility] in [place] in case can re-consider since had a COVID outbreak one week ago. Await sister to return worker's call.

## 2020-10-29 NOTE — NC FL2 (Signed)
MEDICAID FL2 LEVEL OF CARE SCREENING TOOL     IDENTIFICATION  Patient Name: Zachary Flores Birthdate: 1966-01-03 Sex: male Admission Date (Current Location): 09/15/2020  [facility] and [place] Number:  Producer, television/film/video and Address:  [facility], [address] [place], [place] [address]      Provider Number: [id number]  Attending Physician Name and Address:  Marcello Fennel, MD  Relative Name and Phone Number:  Karam Dunson [phone]    Current Level of Care: Hospital Recommended Level of Care: Skilled Nursing Facility Prior Approval Number:    Date Approved/Denied:   PASRR Number: [id number]  Discharge Plan: SNF    Current Diagnoses: Patient Active Problem List   Diagnosis Date Noted  . Urinary incontinence   . Slow transit constipation   . Vascular headache   . Blood pressure increase diastolic   . Labile blood pressure   . Hemiparesis affecting left side as late effect of stroke (HCC)   . Thrombocytosis   . Hypotension   . Urinary retention   . History of hypertension   . Drug-induced hypotension   . Pressure injury of skin 09/19/2020  . 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 Left-sided weakness   . Polysubstance abuse (HCC)   . Essential hypertension   . Dysphagia, post-stroke   . Cocaine abuse (HCC) 09/10/2020  . Dysarthria 09/10/2020  . Acute metabolic encephalopathy 09/10/2020  . Alcohol withdrawal syndrome with complication, with unspecified complication (HCC)   . Hemorrhagic stroke (HCC) 09/06/2020  . TIA (transient ischemic attack) 06/30/2019  . Alcohol abuse with intoxication (HCC) 06/30/2019  . Elevated LFTs 06/30/2019    Orientation RESPIRATION BLADDER Height & Weight     Self, Time, Situation, Place  Normal Continent Weight: 172 lb 13.5 oz (78.4 kg) Height:  5\' 7"  (170.2 cm)  BEHAVIORAL SYMPTOMS/MOOD NEUROLOGICAL BOWEL  NUTRITION STATUS      Continent Diet (Regular Thin liquids)  AMBULATORY STATUS COMMUNICATION OF NEEDS Skin   Limited Assist Verbally Normal                       Personal Care Assistance Level of Assistance  Bathing, Dressing Bathing Assistance: Limited assistance Feeding assistance: Independent Dressing Assistance: Limited assistance     Functional Limitations Info  Sight Sight Info: Impaired        SPECIAL CARE FACTORS FREQUENCY  PT (By licensed PT), OT (By licensed OT)     PT Frequency: 5x week OT Frequency: 5x week     Speech Therapy Frequency: 3 x week      Contractures Contractures Info: Not present    Additional Factors Info  Code Status, Allergies Code Status Info: Full Code Allergies Info: NKDA           Current Medications (10/29/2020):  This is the current hospital active medication list Current Facility-Administered Medications  Medication Dose Route Frequency Provider Last Rate Last Admin  . acetaminophen (TYLENOL) tablet 650 mg  650 mg Oral Q4H PRN 13/04/2020, PA-C   650 mg at 09/23/20 1949   Or  . acetaminophen (TYLENOL) suppository 650 mg  650 mg Rectal Q4H PRN Angiulli, 09/25/20, PA-C      . feeding supplement (ENSURE ENLIVE) (ENSURE ENLIVE) liquid 237 mL  237 mL Oral TID BM AngiulliMcarthur Rossetti, PA-C   237 mL at 10/28/20 1957  . folic acid (FOLVITE) tablet 1 mg  1  mg Oral Daily Charlton Amor, PA-C   1 mg at 10/29/20 0944  . multivitamin with minerals tablet 1 tablet  1 tablet Oral Daily Charlton Amor, PA-C   1 tablet at 10/29/20 0944  . pantoprazole (PROTONIX) EC tablet 40 mg  40 mg Oral Daily Charlton Amor, PA-C   40 mg at 10/29/20 0944  . polyethylene glycol (MIRALAX / GLYCOLAX) packet 17 g  17 g Oral BID Marcello Fennel, MD   17 g at 10/28/20 1957  . senna-docusate (Senokot-S) tablet 2 tablet  2 tablet Oral BID Marcello Fennel, MD   2 tablet at 10/28/20 1957  . sorbitol 70 % solution 30 mL  30 mL Oral Daily PRN  Charlton Amor, PA-C   30 mL at 10/16/20 1252  . thiamine tablet 100 mg  100 mg Oral Daily Charlton Amor, PA-C   100 mg at 10/29/20 5329     Discharge Medications: Please see discharge summary for a list of discharge medications.  Relevant Imaging Results:  Relevant Lab Results:   Additional Information SSN: [id number] Will be LOG has applied for SSD and medicaid. has not had his COVID vaccines  Gianni Fuchs, Lemar Livings, LCSW

## 2020-10-29 NOTE — Progress Notes (Signed)
   10/29/20 1932  Assess: MEWS Score  Temp (!) 103.1 F (39.5 C)  BP 117/84  Pulse Rate (!) 110  Resp 18  SpO2 96 %  O2 Device Room Air  Assess: MEWS Score  MEWS Temp 2  MEWS Systolic 0  MEWS Pulse 1  MEWS RR 0  MEWS LOC 0  MEWS Score 3  MEWS Score Color Yellow  Assess: if the MEWS score is Yellow or Red  Were vital signs taken at a resting state? Yes  Focused Assessment Change from prior assessment (see assessment flowsheet)  Early Detection of Sepsis Score *See Row Information* Medium  MEWS guidelines implemented *See Row Information* Yes  Treat  MEWS Interventions Administered prn meds/treatments;Escalated (See documentation below)  Pain Scale 0-10  Pain Score 0  Take Vital Signs  Increase Vital Sign Frequency  Yellow: Q 2hr X 2 then Q 4hr X 2, if remains yellow, continue Q 4hrs  Escalate  MEWS: Escalate Yellow: discuss with charge nurse/RN and consider discussing with provider and RRT  Notify: Charge Nurse/RN  Name of Charge Nurse/RN Notified Dois Davenport RN   Date Charge Nurse/RN Notified 10/29/20  Time Charge Nurse/RN Notified 1955  Notify: Provider  Provider Name/Title Dr. Carlis Abbott   Date Provider Notified 10/26/20  Time Provider Notified 2002  Notification Type Call  Notification Reason Change in status  Response See new orders  Date of Provider Response 10/29/20  Time of Provider Response 2003   Patient on enteric precautions at beginning of shift noted for a fever of 103.1 F and vital signs elevated to point of becoming a yellow mews at 1932, yellow mews protocol followed Charge nurse sandra notified at 1956 Dr. Carlis Abbott notified at 2002 and new orders received to start IV vancomycin and Zosyn after blood cultures were completed. IV vancomycin started at 22:13

## 2020-10-29 NOTE — Progress Notes (Signed)
Speech Language Pathology Daily Session Note  Patient Details  Name: Brayln Duque MRN: [id number] Date of Birth: 1966/01/27  Today's Date: 10/29/2020 SLP Individual Time: 0383-3383 SLP Individual Time Calculation (min): 57 min  Short Term Goals: Week 6: SLP Short Term Goal 1 (Week 6): STG=LTG due to remaining length of stay (pt is pending placement at SNF)  Skilled Therapeutic Interventions: Pt was seen for skilled ST targeting cognitive goals. SLP facilitated session with mildly complex medication management task. Pt used list to discuss current medications and administration frequencies with Min A for organization and problem solving. He then used list to organize BID pill box with overall Mod A verbal and visual cues for error awareness and problem solving. Pt also completed a semi-complex 4-step action card sequencing task with overall Min A verbal cues for problem solving. Pt with increased fatigue throughout session, left resting in bed with alarm set and needs within reach. Continue per current plan of care.          Pain Pain Assessment Pain Scale: 0-10 Pain Score: 0-No pain  Therapy/Group: Individual Therapy  [name] 10/29/2020, 12:06 PM

## 2020-10-30 ENCOUNTER — Inpatient Hospital Stay (HOSPITAL_COMMUNITY): Payer: Self-pay | Admitting: Occupational Therapy

## 2020-10-30 ENCOUNTER — Inpatient Hospital Stay (HOSPITAL_COMMUNITY): Payer: Self-pay | Admitting: Physical Therapy

## 2020-10-30 LAB — TROPONIN I (HIGH SENSITIVITY): Troponin I (High Sensitivity): 9 ng/L (ref <18)

## 2020-10-30 NOTE — Progress Notes (Signed)
Occupational Therapy Session Note  Patient Details  Name: Zachary Flores MRN: [id number] Date of Birth: 16-Jul-1966  Today's Date: 10/30/2020 OT Individual Time: 3887-1959 OT Individual Time Calculation (min): 42 min    Short Term Goals: Week 3:  OT Short Term Goal 1 (Week 3): STGs= LTGs due to ELOS OT Short Term Goal 1 - Progress (Week 3): Progressing toward goal  Skilled Therapeutic Interventions/Progress Updates:  Patient met lying supine in bed in agreement with OT treatment session. Session with focus on UB bathing, UB/LB dressing, and grooming seated at sink level. 0/10 pain reported at rest and with activity. All laundry soiled. OT placed clothing in washing machine. Paper scrubs provided for patient to wear this date. Patient able to bathe/dress UB with set-up assist. Seated EOB, patient able to thread BLE through LB clothing with assist. At sink level, patient able to stand with L knee block to hike pants over hips with Min A. Oral hygiene seated at sink level with supervision A and occasional cues for functional organization. Session concluded with patient seated in wc with call bell within reach, belt alarm activated, and all needs met.    Therapy Documentation Precautions:  Precautions Precautions: Fall Precaution Comments: left side ataxia and weakness Restrictions Weight Bearing Restrictions: No General:    Therapy/Group: Individual Therapy  Mylisa Brunson R Howerton-Davis 10/30/2020, 8:45 AM

## 2020-10-30 NOTE — Progress Notes (Signed)
   10/29/20 2221  Assess: MEWS Score  BP 117/76  Pulse Rate (!) 116  Resp (!) 24  SpO2 98 %  O2 Device Room Air  Assess: MEWS Score  MEWS Temp 2  MEWS Systolic 0  MEWS Pulse 2  MEWS RR 1  MEWS LOC 0  MEWS Score 5  MEWS Score Color Red  Assess: if the MEWS score is Yellow or Red  Were vital signs taken at a resting state? Yes  Focused Assessment No change from prior assessment  Early Detection of Sepsis Score *See Row Information* Medium  MEWS guidelines implemented *See Row Information* Yes

## 2020-10-30 NOTE — Progress Notes (Signed)
   10/29/20 2329  Assess: MEWS Score  Temp (!) 101.3 F (38.5 C)  BP 114/74  Pulse Rate (!) 117  Resp (!) 24  Level of Consciousness Alert  Assess: MEWS Score  MEWS Temp 1  MEWS Systolic 0  MEWS Pulse 2  MEWS RR 1  MEWS LOC 0  MEWS Score 4  MEWS Score Color Red  Assess: if the MEWS score is Yellow or Red  Were vital signs taken at a resting state? Yes  Focused Assessment No change from prior assessment  Early Detection of Sepsis Score *See Row Information* Medium  MEWS guidelines implemented *See Row Information* Yes

## 2020-10-30 NOTE — Progress Notes (Signed)
[facility] PHYSICAL MEDICINE & REHABILITATION PROGRESS NOTE   Subjective/Complaints: Feeling better this morning Still has abdominal pain Denies nausea. KUB normal  ROS: Denies CP, SOB, N/V/D  Objective:   DG Abd 1 View  Result Date: 10/29/2020 CLINICAL DATA:  Abdominal pain EXAM: ABDOMEN - 1 VIEW COMPARISON:  None. FINDINGS: The bowel gas pattern is normal. No radio-opaque calculi or other significant radiographic abnormality are seen. IMPRESSION: Negative. Electronically Signed   By: Jonna Clark M.D.   On: 10/29/2020 20:09   DG CHEST PORT 1 VIEW  Result Date: 10/29/2020 CLINICAL DATA:  Tachypnea. EXAM: PORTABLE CHEST 1 VIEW COMPARISON:  None. FINDINGS: Low lung volumes. Normal heart size for technique. Normal mediastinal contours. No focal airspace disease. No pulmonary edema. No pneumothorax or large pleural effusion. Degenerative change of both shoulders. No acute osseous abnormalities are seen. IMPRESSION: Low lung volumes without acute abnormality. Electronically Signed   By: Narda Rutherford M.D.   On: 10/29/2020 23:22   Recent Labs    10/29/20 2034  WBC 7.9  HGB 15.7  HCT 48.7  PLT 424*   Recent Labs    10/29/20 2034  NA 139  K 3.5  CL 103  CO2 23  GLUCOSE 114*  BUN 12  CREATININE 1.03  CALCIUM 9.8    Intake/Output Summary (Last 24 hours) at 10/30/2020 1048 Last data filed at 10/30/2020 0700 Gross per 24 hour  Intake 720 ml  Output 150 ml  Net 570 ml    Physical Exam: Vital Signs Blood pressure (!) 109/95, pulse (!) 103, temperature 99.6 F (37.6 C), resp. rate 16, height 5\' 7"  (1.702 m), weight 78.4 kg, SpO2 100 %.  General: Alert and oriented x 3, No apparent distress HEENT: Head is normocephalic, atraumatic, PERRLA, EOMI, sclera anicteric, oral mucosa pink and moist, dentition intact, ext ear canals clear,  Neck: Supple without JVD or lymphadenopathy Heart: Reg rate and rhythm. No murmurs rubs or gallops Chest: CTA bilaterally without wheezes,  rales, or rhonchi; no distress Abdomen: Soft, non-tender, non-distended, bowel sounds positive.  Psych: Normal mood.  Normal behavior. Musc: No edema in extremities.  No tenderness in extremities. Neuro: Alert Dysarthria, stable Follows simple commands.   Limited awareness of deficits  Left neglect, stable Left sided apraxia and ataxia, stable Motor: RUE: 4+/5 proximal distal, stable RLE: 4-4+/5 proximal distal, stable LUE: 4+/5 proximal distal, stable LLE: Hip flexion, knee extension 4/5, ankle dorsiflexion 3/5, stable Left upper extremity dysmetria and apraxia, unchanged  Assessment/Plan: 1. 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  Physiatrist is providing close team supervision and 24 hour management of active medical problems listed below.  Physiatrist and rehab team continue to assess barriers to discharge/monitor patient progress toward functional and medical goals    Medical Problem List and Plan: 1.  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  Antithrombotics: -DVT/anticoagulation: SCDs             -antiplatelet therapy: N/A 3. Pain Management: Tylenol as needed for headaches.  Has abdominal pain- improved. KUB negative. Likely GI virus.  4. Mood: Advised emotional support             -antipsychotic agents: Seroquel 25 mg nightly DC'd 5. Neuropsych: This patient is is?  Fully capable of making decisions on his own behalf. 6. Skin/Wound  Care: foam dressing, continue pressure relief. 7. Fluids/Electrolytes/Nutrition: Routine in and outs.    BMP within normal limits on 11/2 8.  Post stroke dysphagia.      Advanced to regular thins, now with intermittent supervision  Overall good po intake  Continue to advance diet as tolerated 9.  History of  hypertension.    Lopressor 25 mg twice daily, decreased to 12.5 twice daily on 9/28, DC'd on 9/30  Lisinopril DC'd Vitals:   10/30/20 0509 10/30/20 0928  BP: 123/79 (!) 109/95  Pulse: 100 (!) 103  Resp: 20 16  Temp: 98.5 F (36.9 C) 99.6 F (37.6 C)  SpO2: 100% 100%   Stable 11/6 10.  Polysubstance abuse-alcohol, tobacco, cocaine abuse.  Monitor for any signs of withdrawal.    Supplement thiamine and folic acid  Counsel 11. Urinary retention/urinary incontinence:   Periods of incontinence incontinence  PVRs improved  Will avoid medications at present given blood pressure  Continue to monitor 12.  Thrombocytosis: Resolved  Platelets 362 on 10/15 13.  Slow transit constipation  Bowel meds increased on 10/18, increased again on 10/23, increased again on 10/24  Improving 14. GI virus: fever of 103 overnight- started IV antibiotics after cultures drawn- pending. Afbrile this morning, labs stable, still with some abdominal pain, loose stool. KUB and CXR stable. Stop abx. C diff negative. D/c Miralax and senna  LOS: 45 days A FACE TO FACE EVALUATION WAS PERFORMED  Drema Pry Ellana Kawa 10/30/2020, 10:48 AM

## 2020-10-30 NOTE — Progress Notes (Signed)
   10/30/20 0021  Assess: MEWS Score  Temp 99.2 F (37.3 C)  BP 115/82  Pulse Rate (!) 117  Resp (!) 24  Level of Consciousness Alert  SpO2 99 %  O2 Device Room Air  Assess: MEWS Score  MEWS Temp 0  MEWS Systolic 0  MEWS Pulse 2  MEWS RR 1  MEWS LOC 0  MEWS Score 3  MEWS Score Color Yellow  Treat  Pain Scale 0-10  Pain Score 0  Neuro symptoms relieved by Rest  Diarrhea relieved by Antidiarrheal

## 2020-10-30 NOTE — Progress Notes (Signed)
Vital signs return to within normal limits, no acute distress noted. No c/o pain bed in lowest position with call bell in reach. Antibiotics vancomycin and zosyn continued through this am every 8 hours last doses at 5 am and 6am  Bedside fluids encouraged and patient safety ensured. Urine culture still pending. Will notify oncoming nurse and MD    10/30/20 0509  Assess: MEWS Score  Temp 98.5 F (36.9 C)  BP 123/79  Pulse Rate 100  Resp 20  SpO2 100 %  O2 Device Room Air  Assess: MEWS Score  MEWS Temp 0  MEWS Systolic 0  MEWS Pulse 0  MEWS RR 0  MEWS LOC 0  MEWS Score 0  MEWS Score Color Green  Assess: if the MEWS score is Yellow or Red  Were vital signs taken at a resting state? Yes  Focused Assessment Change from prior assessment (see assessment flowsheet)  Early Detection of Sepsis Score *See Row Information* Low  MEWS guidelines implemented *See Row Information* Yes  Document  Patient Outcome Stabilized after interventions  Progress note created (see row info) Yes

## 2020-10-30 NOTE — Progress Notes (Signed)
   10/30/20 0112  Assess: MEWS Score  Temp 99 F (37.2 C)  BP 121/89  Pulse Rate (!) 120  Resp (!) 22  SpO2 95 %  O2 Device Room Air  Assess: MEWS Score  MEWS Temp 0  MEWS Systolic 0  MEWS Pulse 2  MEWS RR 1  MEWS LOC 0  MEWS Score 3  MEWS Score Color Yellow

## 2020-10-31 LAB — URINE CULTURE: Culture: <10000 — AB

## 2020-10-31 NOTE — Progress Notes (Signed)
[facility] PHYSICAL MEDICINE & REHABILITATION PROGRESS NOTE   Subjective/Complaints: Feeling better Denies abdominal pain, diarrhea  ROS: DeniesCP, SOB, N/V/D  Objective:   DG Abd 1 View  Result Date: 10/29/2020 CLINICAL DATA:  Abdominal pain EXAM: ABDOMEN - 1 VIEW COMPARISON:  None. FINDINGS: The bowel gas pattern is normal. No radio-opaque calculi or other significant radiographic abnormality are seen. IMPRESSION: Negative. Electronically Signed   By: Jonna Clark M.D.   On: 10/29/2020 20:09   DG CHEST PORT 1 VIEW  Result Date: 10/29/2020 CLINICAL DATA:  Tachypnea. EXAM: PORTABLE CHEST 1 VIEW COMPARISON:  None. FINDINGS: Low lung volumes. Normal heart size for technique. Normal mediastinal contours. No focal airspace disease. No pulmonary edema. No pneumothorax or large pleural effusion. Degenerative change of both shoulders. No acute osseous abnormalities are seen. IMPRESSION: Low lung volumes without acute abnormality. Electronically Signed   By: Narda Rutherford M.D.   On: 10/29/2020 23:22   Recent Labs    10/29/20 2034  WBC 7.9  HGB 15.7  HCT 48.7  PLT 424*   Recent Labs    10/29/20 2034  NA 139  K 3.5  CL 103  CO2 23  GLUCOSE 114*  BUN 12  CREATININE 1.03  CALCIUM 9.8    Intake/Output Summary (Last 24 hours) at 10/31/2020 1301 Last data filed at 10/31/2020 0744 Gross per 24 hour  Intake 598 ml  Output --  Net 598 ml    Physical Exam: Vital Signs Blood pressure 109/70, pulse 84, temperature 100 F (37.8 C), temperature source Oral, resp. rate 16, height 5\' 7"  (1.702 m), weight 78.4 kg, SpO2 94 %.   General: Alert, No apparent distress HEENT: Head is normocephalic, atraumatic, PERRLA, EOMI, sclera anicteric, oral mucosa pink and moist, dentition intact, ext ear canals clear,  Neck: Supple without JVD or lymphadenopathy Heart: Reg rate and rhythm. No murmurs rubs or gallops Chest: CTA bilaterally without wheezes, rales, or rhonchi; no distress Abdomen:  Soft, non-tender, non-distended, bowel sounds positive.  Psych: Normal mood.  Normal behavior. Musc: No edema in extremities.  No tenderness in extremities. Neuro: Alert Dysarthria, stable Follows simple commands.   Limited awareness of deficits  Left neglect, stable Left sided apraxia and ataxia, stable Motor: RUE: 4+/5 proximal distal, stable RLE: 4-4+/5 proximal distal, stable LUE: 4+/5 proximal distal, stable LLE: Hip flexion, knee extension 4/5, ankle dorsiflexion 3/5, stable Left upper extremity dysmetria and apraxia, unchanged  Assessment/Plan: 1. 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  Physiatrist is providing close team supervision and 24 hour management of active medical problems listed below.  Physiatrist and rehab team continue to assess barriers to discharge/monitor patient progress toward functional and medical goals    Medical Problem List and Plan: 1.  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  Antithrombotics: -DVT/anticoagulation: SCDs             -antiplatelet therapy: N/A 3. Pain Management: Tylenol as needed for headaches.  Has abdominal pain-resolved. KUB negative. Likely GI virus.  4. Mood: Advised emotional support             -antipsychotic agents: Seroquel 25 mg nightly DC'd 5. Neuropsych: This patient is is?  Fully capable of making decisions on his own behalf. 6. Skin/Wound Care: foam dressing, continue pressure relief. 7. Fluids/Electrolytes/Nutrition: Routine in and  outs.    BMP within normal limits on 11/2 8.  Post stroke dysphagia.      Advanced to regular thins, now with intermittent supervision  Overall good po intake  Continue to advance diet as tolerated 9.  History of hypertension.    Lopressor 25 mg twice  daily, decreased to 12.5 twice daily on 9/28, DC'd on 9/30  Lisinopril DC'd Vitals:   10/31/20 0547 10/31/20 0830  BP: 109/70   Pulse: (!) 108 84  Resp: 20 16  Temp: 100 F (37.8 C)   SpO2: 94%    Stable 11/7 10.  Polysubstance abuse-alcohol, tobacco, cocaine abuse.  Monitor for any signs of withdrawal.    Supplement thiamine and folic acid  Counsel 11. Urinary retention/urinary incontinence:   Periods of incontinence incontinence  PVRs improved  Will avoid medications at present given blood pressure  Continue to monitor 12.  Thrombocytosis: Resolved  Platelets 362 on 10/15 13.  Slow transit constipation  Bowel meds increased on 10/18, increased again on 10/23, increased again on 10/24  Improving 14. GI virus: fever of 103 overnight- started IV antibiotics after cultures drawn- pending. Afbrile this morning, labs stable, abdominal pain and diarrhea resolved. KUB and CXR stable. Stop abx. C diff negative. D/c Miralax and senna  LOS: 46 days A FACE TO FACE EVALUATION WAS PERFORMED  Irmalee Riemenschneider P Laniesha Das 10/31/2020, 1:01 PM

## 2020-11-01 ENCOUNTER — Inpatient Hospital Stay (HOSPITAL_COMMUNITY): Payer: Self-pay | Admitting: Speech Pathology

## 2020-11-01 ENCOUNTER — Inpatient Hospital Stay (HOSPITAL_COMMUNITY): Payer: Self-pay | Admitting: Occupational Therapy

## 2020-11-01 ENCOUNTER — Inpatient Hospital Stay (HOSPITAL_COMMUNITY): Payer: Self-pay

## 2020-11-01 DIAGNOSIS — A084 Viral intestinal infection, unspecified: Secondary | ICD-10-CM

## 2020-11-01 NOTE — Progress Notes (Signed)
[facility] PHYSICAL MEDICINE & REHABILITATION PROGRESS NOTE   Subjective/Complaints: States he had a good night. Feeling better.   ROS: Patient denies fever, rash, sore throat, blurred vision, nausea, vomiting, diarrhea, cough, shortness of breath or chest pain, joint or back pain, headache, or mood change.   Objective:   No results found. Recent Labs    10/29/20 2034  WBC 7.9  HGB 15.7  HCT 48.7  PLT 424*   Recent Labs    10/29/20 2034  NA 139  K 3.5  CL 103  CO2 23  GLUCOSE 114*  BUN 12  CREATININE 1.03  CALCIUM 9.8    Intake/Output Summary (Last 24 hours) at 11/01/2020 1242 Last data filed at 11/01/2020 0830 Gross per 24 hour  Intake 720 ml  Output 150 ml  Net 570 ml    Physical Exam: Vital Signs Blood pressure 111/82, pulse 78, temperature 97.9 F (36.6 C), temperature source Oral, resp. rate 20, height 5\' 7"  (1.702 m), weight 78.4 kg, SpO2 98 %.   Constitutional: No distress . Vital signs reviewed. HEENT: EOMI, oral membranes moist Neck: supple Cardiovascular: RRR without murmur. No JVD    Respiratory/Chest: CTA Bilaterally without wheezes or rales. Normal effort    GI/Abdomen: BS +, non-tender, non-distended Ext: no clubbing, cyanosis, or edema Psych: pleasant and cooperative. Musc: No edema in extremities.  No tenderness in extremities. Neuro: Alert Dysarthric speech, stable Follows simple commands.   Limited awareness of deficits  Left neglect, stable Left sided apraxia and ataxia, stable Motor: RUE: 4+/5 proximal distal, stable RLE: 4-4+/5 proximal distal, stable LUE: 4+/5 proximal distal, stable LLE: Hip flexion, knee extension 4/5, ankle dorsiflexion 3/5, stable Left upper extremity dysmetria and apraxia, unchanged  Assessment/Plan: 1. 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  Physiatrist is providing close team supervision and  24 hour management of active medical problems listed below.  Physiatrist and rehab team continue to assess barriers to discharge/monitor patient progress toward functional and medical goals    Medical Problem List and Plan: 1.  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 May end up going home 2.  Antithrombotics: -DVT/anticoagulation: SCDs             -antiplatelet therapy: N/A 3. Pain Management: Tylenol as needed for headaches.  Has abdominal pain-resolved. KUB negative. Likely GI virus.  4. Mood: Advised emotional support             -antipsychotic agents: Seroquel 25 mg nightly DC'd 5. Neuropsych: This patient is is?  Fully capable of making decisions on his own behalf. 6. Skin/Wound Care: foam dressing, continue pressure relief. 7. Fluids/Electrolytes/Nutrition: Routine in and outs.    BMP within normal limits on 11/2 8.  Post stroke dysphagia.      Advanced to regular thins, now with intermittent supervision  Overall good po intake  Continue to advance diet as tolerated 9.  History of hypertension.    Lopressor 25 mg twice daily, decreased to 12.5 twice daily on 9/28, DC'd on 9/30  Lisinopril DC'd Vitals:   10/31/20 1908 11/01/20 0459  BP: 114/75 111/82  Pulse: 86 78  Resp: 18 20  Temp: 98 F (36.7 C) 97.9 F (36.6 C)  SpO2: 98% 98%   Stable 11/8 10.  Polysubstance abuse-alcohol, tobacco, cocaine abuse.  Monitor for any signs  of withdrawal.    Supplement thiamine and folic acid  Counsel 11. Urinary retention/urinary incontinence:   Periods of incontinence incontinence  PVRs improved  Will avoid medications at present given blood pressure  Continue to monitor 12.  Thrombocytosis: Resolved  Platelets 362 on 10/15 13.  Slow transit constipation  Bowel meds increased on 10/18, increased again on 10/23, increased again on 10/24  Had bm this morning  11/8 14. GI virus: 100.7 temp 11/7 at 1400  -ucx C Diff negative, CXR,KUB unremarkable, WBC's normal over weekend  -temp trending down, says he feels better  -off laxatives and softeners.   -abx stopped  -monitor for any further signs or symptoms  LOS: 47 days A FACE TO FACE EVALUATION WAS PERFORMED  Ranelle Oyster 11/01/2020, 12:42 PM

## 2020-11-01 NOTE — Progress Notes (Signed)
Physical Therapy Session Note  Patient Details  Name: Zachary Flores MRN: [id number] Date of Birth: [birth year]  Today's Date: 11/01/2020 PT Individual Time: 1000-1100 PT Individual Time Calculation (min): 60 min   Short Term Goals: Week 6:  PT Short Term Goal 1 (Week 6): STG = LTG due to ELOS  Skilled Therapeutic Interventions/Progress Updates:    Pt received supine in bed, agreeable to PT session; reports no pain. Donned pants with modA while supine, cues for bridging technique and requires therapist assist for stabilizing L knee during bridging. Supine<>sit with supervision with HOB flat and RUE to bedrail. Able to sit EOB with supervision. He required minA for removing dirty scrub t-shirt and modA for donning new clean shirt. He also required modA for donning B socks/shoes while seated EOB, cues for figure-4 technique but demo's significant posterior lean with this and has difficulty keeping leg propped in figure-4 position. Squat<>pivot with modA from EOB to w/c, cues for safety approach, general sequencing, and technique. Wheeled sinkside where he then performed oral care, primarily using RUE. Propelled himself with minA in w/c using BLEs only, continues to show significantly decreased efficiency with pedal strokes despite max cueing for corrections. Required modA for stand<>pivot transfer from w/c to Nustep where he performed x8 minutes at workload of 4, focusing on BLE coordination and L knee control, therapist providing multi-modal cueing throughout. Stand<>pivot with modA back to w/c and returned to room where he remained seated in w/c at end of session, safety belt alarm on, needs in reach.  Therapy Documentation Precautions:  Precautions Precautions: Fall Precaution Comments: left side ataxia and weakness Restrictions Weight Bearing Restrictions: No  Therapy/Group: Individual Therapy  Divine Imber P [name] PT 11/01/2020, 7:42 AM

## 2020-11-01 NOTE — Progress Notes (Signed)
Speech Language Pathology Daily Session Note  Patient Details  Name: Zachary Flores MRN: [id number] Date of Birth: 10/12/1966  Today's Date: 11/01/2020 SLP Individual Time: 0730-0825 SLP Individual Time Calculation (min): 55 min  Short Term Goals: Week 6: SLP Short Term Goal 1 (Week 6): STG=LTG due to remaining length of stay (pt is pending placement at SNF)  Skilled Therapeutic Interventions: Pt was seen for skilled ST targeting cognitive goals. SLP facilitated session with money management and functioal discharge planning activities. Pt required extra time and overall Min A verbal and visual cues for problem solving and error awareness to complete calculations with a basic money management task using coins. He continues to progress with basic to mildly complex problem solving abilities and reports he feels close to baseline in regards to that skill. In functional conversation regarding potential discharge situations (given that pt is unsure whether he will d/c to SNF or home with sister), overall Supervision A verbal cues provided for him to anticipate 3 activities he will need assistance with, identifying recall strategies for functional ADLs such as medication management, and identifying key deficits. Pt expressed that he feels as though he will be a burden on family if only option is to return home with them; encouragement and support provided. Although more solemn today, he remains generally positive and very motivated to work with therapy. Pt attended to session without any cueing required today. Pt left laying in bed with alarm set and needs within reach. Continue per current plan of care.         Pain Pain Assessment Pain Scale: 0-10 Pain Score: 0-No pain  Therapy/Group: Individual Therapy  Zachary Flores 11/01/2020, 11:36 AM

## 2020-11-01 NOTE — Progress Notes (Signed)
Occupational Therapy Session Note  Patient Details  Name: Zachary Flores MRN: [id number] Date of Birth: [birth year]  Today's Date: 11/01/2020 OT Individual Time: 1416-1530 OT Individual Time Calculation (min): 74 min    Short Term Goals: Week 6:  OT Short Term Goal 1 (Week 6): Pt will complete toileting with min assist OT Short Term Goal 2 (Week 6): Pt will complete toilet transfer with CGA OT Short Term Goal 3 (Week 6): Pt will complete LB dressing with CGA  Skilled Therapeutic Interventions/Progress Updates:    Pt sitting up in w/c, no c/o pain, requesting to toilet and reports he doesn't want to wash up today.  Pt transported to bathroom and completed stand pivot using grab bars with mod assist exhibiting significant ataxic movement of LUE/LLE with difficulty stepping back with RLE due to poor weight shifting despite cues needing manual assist for safe transfer.  Pt completed toileting with mod assist needing step by step VCs for body mechanics and BUE/BLE placement.  Stand pivot to w/c with min assist.  Pt washed hands in sitting sinkside with setup.  Pt self propelled via w/c to dayroom with min assist and frequent VCs to improve BLE motor control due to touching down with heels only and favoring propulsion using LLE causing w/c to veer to one side.  Poor to fair carryover of body mechanics exhibited with VCs.  Pt completed blocked practice of squat pivot w/c<>EOM needing min assist and mod VCs for body mechanics.  Pt participated in seated LUE GMC reach/grasp/toss activity, dowel ball tap using 5# dowel, and resistive ball kick alternating BLE using 3 lb ankle weights.  Pt exhibiting difficulty shifting weight to left in order to lift and flex right knee for efficient kicking motion.  After VCs, and visual demo, pt able to carryover for improved kicking motion and control.  Squat pivot back to w/c with min assist.  Pt self propelled back to room needing same assist as prior mentioned.  Call bell  in reach, seat belt alarm on.  Therapy Documentation Precautions:  Precautions Precautions: Fall Precaution Comments: left side ataxia and weakness Restrictions Weight Bearing Restrictions: No   Therapy/Group: Individual Therapy  Amie Critchley 11/01/2020, 4:01 PM

## 2020-11-01 NOTE — Progress Notes (Addendum)
Patient ID: Zachary Flores, male   DOB: [birth year], 54 y.o.   MRN: [id number]  Message left for [name]-sister await return call.  1:20 PM Spoke with bessie his sister via telephone to inform of inability to get pt a SNF bed due to being uninsured and substance abuse issues. She was looking for an apartment for him on Friday. Explained pt needs care and can not be in a apartment alone. She understands he needs 24 hr care due to safety issues and assistance. Asked if could go with his brother-Frank since he was living with him and their Mom prior to admission. Sister reports they don't and [name] has health issues and HD. Discussed the ned to have a family meeting to come up with a plan for him. Pt where ever he goes will be at high risk to come right back into the hospital. [name] wanted to know how much time she has informed her plan needs to get into place for discharge, due to long length of stay already. She will talk with her siblings worker to call back tomorrow.  2:00 PM reached out to [facility] again to see if can look at updated info and if could offer a bed. She voiced she will let me know

## 2020-11-02 ENCOUNTER — Inpatient Hospital Stay (HOSPITAL_COMMUNITY): Payer: Self-pay | Admitting: Occupational Therapy

## 2020-11-02 ENCOUNTER — Inpatient Hospital Stay (HOSPITAL_COMMUNITY): Payer: Self-pay

## 2020-11-02 ENCOUNTER — Inpatient Hospital Stay (HOSPITAL_COMMUNITY): Payer: Self-pay | Admitting: Speech Pathology

## 2020-11-02 NOTE — Progress Notes (Addendum)
Occupational Therapy Session Note  Patient Details  Name: Zachary Flores MRN: [id number] Date of Birth: 1966-01-24  Today's Date: 11/02/2020 OT Individual Time: 1300-1408 OT Individual Time Calculation (min): 68 min    Short Term Goals: Week 6:  OT Short Term Goal 1 (Week 6): Pt will complete toileting with min assist OT Short Term Goal 2 (Week 6): Pt will complete toilet transfer with CGA OT Short Term Goal 3 (Week 6): Pt will complete LB dressing with CGA  Skilled Therapeutic Interventions/Progress Updates:    Pt sitting up in w/c, no c/o pain, requesting to toilet. "You do it for me" per pt intermittently throughout OT session.  Pt needing encouragement to maximize independence during self care and functional transfers today.  Pt required max assist to propel to bathroom due to pt unable to propel with BLE with shoes doffed, unable to get enough traction.  Pt completed squat pivot transfer with mod assist w/c<>3 in 1 commode.  Mod assist needed for toileting due to ataxia in BLE with difficulty maintaining balance without BUE support.  Pt completed w/c<>TTB with mod assist needing max VCs to prevent impulsivity and improve motor control and body mechanics.  Pt doffed shirt with mod I and brief and pants with min assist. Pt doffed socks with CGA. Water proof barrier applied to left IV.  Pt bathed UB and LB with min assist for buttocks and utilized long handled sponge as needed seated for remainder of bathing. Pt donned shirt sitting at sink with setup.  Pt donned brief and pants with min assist for sit<>stand at sink needing VCs for BLE and BUE placement.  Pt shaved beard sitting sinkside with supervision.  Reviewed STGs with pt to facilitate pt centered care and increase pt participation during session.  Pt reports reviewing his goals helps him understand and motivate.  Squat pivot w/c to EOB with min assist needing min VCs for hand and foot placement and sequencing of w/c mgt.  Sit to supine with  CGA.  Call bell in reach, bed alarm on.  Therapy Documentation Precautions:  Precautions Precautions: Fall Precaution Comments: left side ataxia and weakness Restrictions Weight Bearing Restrictions: No   Therapy/Group: Individual Therapy  Amie Critchley 11/02/2020, 12:59 PM

## 2020-11-02 NOTE — Progress Notes (Addendum)
Patient ID: Zachary Flores, male   DOB: 04-11-1966, 54 y.o.   MRN: [id number]  Spoke with [name]-pt's sister to discuss plan. She has spoken with frank pt's brother asking what he can do to assist. He reported none, he is almost homeless himself and on HD. [name] is calling around and looking for a boarding house. Discussed again pt needs care and will not be safe alone in a room and could not provide for himself. Will ask team to focus on transfers so possibly can be safer wheelchair level. Where ever he goes if not a NH will be a safety risk and high fall risk.  2:30 PM Spoke with [name] who report they can take him clinically but can not admit until 11/18. Due to COVID outbreak. Have informed [name]-sister, pt and MD regarding this. Sister asked for pt to have COVID vaccine due to would benefit him. Have asked Deborah-Clinical Coordinator and she will order and ask if MD agreeable. Continue to work on discharge plan.

## 2020-11-02 NOTE — Progress Notes (Signed)
[facility] PHYSICAL MEDICINE & REHABILITATION PROGRESS NOTE   Subjective/Complaints: Patient seen sitting up in bed this morning working with therapies.  He states he slept well overnight.  Discussed with therapies as well as Child psychotherapist, no changes-awaiting callback.  ROS: Denies CP, SOB, N/V/D  Objective:   No results found. No results for input(s): WBC, HGB, HCT, PLT in the last 72 hours. No results for input(s): NA, K, CL, CO2, GLUCOSE, BUN, CREATININE, CALCIUM in the last 72 hours.  Intake/Output Summary (Last 24 hours) at 11/02/2020 0957 Last data filed at 11/02/2020 0849 Gross per 24 hour  Intake 620 ml  Output 350 ml  Net 270 ml    Physical Exam: Vital Signs Blood pressure 109/84, pulse 63, temperature 98 F (36.7 C), temperature source Oral, resp. rate 17, height 5\' 7"  (1.702 m), weight 78.4 kg, SpO2 100 %.  Constitutional: No distress . Vital signs reviewed. HENT: Normocephalic.  Atraumatic. Eyes: EOMI. No discharge. Cardiovascular: No JVD.  RRR. Respiratory: Normal effort.  No stridor.  Bilateral clear to auscultation. GI: Non-distended.  BS +. Skin: Warm and dry.  Intact. Psych: Normal mood.  Normal behavior. Musc: No edema in extremities.  No tenderness in extremities. Neuro: Alert Dysarthric speech, unchanged Follows simple commands.   Limited awareness of deficits  Left neglect, stable Left sided apraxia and ataxia, stable Motor: RUE: 4+/5 proximal distal, stable RLE: 4-4+/5 proximal distal, unchanged LUE: 4+/5 proximal distal, unchanged LLE: Hip flexion, knee extension 4/5, ankle dorsiflexion 3/5, stable Left upper extremity dysmetria and apraxia, unchanged  Assessment/Plan: 1. 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  Physiatrist is providing close team supervision and 24 hour management of active medical problems listed  below.  Physiatrist and rehab team continue to assess barriers to discharge/monitor patient progress toward functional and medical goals    Medical Problem List and Plan: 1.  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  Antithrombotics: -DVT/anticoagulation: SCDs             -antiplatelet therapy: N/A 3. Pain Management: Tylenol as needed for headaches.  Controlled with meds on 11/9 4. Mood: Advised emotional support             -antipsychotic agents: Seroquel 25 mg nightly DC'd 5. Neuropsych: This patient is is?  Fully capable of making decisions on his own behalf. 6. Skin/Wound Care: foam dressing, continue pressure relief. 7. Fluids/Electrolytes/Nutrition: Routine in and outs.    BMP within normal limits on 11/2 8.  Post stroke dysphagia.      Advanced to regular thins, now with intermittent supervision  Overall good po intake  Continue to advance diet as tolerated 9.  History of hypertension.    Lopressor 25 mg twice daily, decreased to 12.5 twice daily on 9/28, DC'd on 9/30  Lisinopril DC'd Vitals:   11/01/20 1924 11/02/20 0458  BP: 108/72 109/84  Pulse: 69 63  Resp: 18 17  Temp: 99.3 F (37.4 C) 98 F (36.7 C)  SpO2: 99% 100%   Controlled on 11/9 10.  Polysubstance abuse-alcohol, tobacco, cocaine abuse.  Monitor for any signs of withdrawal.    Supplement thiamine and folic acid  Counsel 11. Urinary retention/urinary incontinence:   Periods of incontinence incontinence  PVRs improved  Will avoid medications at present given blood pressure  Continue  to monitor 12.  Thrombocytosis: Resolved  Platelets 362 on 10/15 13.  Slow transit constipation  Bowel meds increased on 10/18, increased again on 10/23, increased again on 10/24,  D/ced  Improving 14.  Fever: On 11/7-?  Resolved  -ucx C Diff negative, CXR,KUB unremarkable, WBC's within  normal limits  -monitor for any further signs or symptoms  LOS: 48 days A FACE TO FACE EVALUATION WAS PERFORMED  Zachary Flores Zachary Flores 11/02/2020, 9:57 AM

## 2020-11-02 NOTE — Progress Notes (Signed)
Speech Language Pathology Daily Session Note  Patient Details  Name: Zachary Flores MRN: [id number] Date of Birth: 03/17/1966  Today's Date: 11/02/2020 SLP Individual Time: 0730-0825 SLP Individual Time Calculation (min): 55 min  Short Term Goals: Week 6: SLP Short Term Goal 1 (Week 6): STG=LTG due to remaining length of stay (pt is pending placement at SNF)  Skilled Therapeutic Interventions: Pt was seen for skilled ST targeting cognitive goals. SLP facilitated session with Moderate verbal and visual cues for semi-complex problem solving functional time-based word problems and projections in context of a movie theatre showing schedule. Extra time but decreased Min A verbal and visual cues required for problem solving and error awareness during a semi-complex 4-step action card sequencing task. Pt left laying in bed with alarm set and needs within reach. Continue per current plan of care.          Pain Pain Assessment Pain Scale: 0-10 Pain Score: 0-No pain   Therapy/Group: Individual Therapy  [name] 11/02/2020, 11:29 AM

## 2020-11-02 NOTE — Progress Notes (Signed)
Physical Therapy Session Note  Patient Details  Name: Zachary Flores MRN: [id number] Date of Birth: 1966/02/07  Today's Date: 11/02/2020 PT Individual Time: 1000-1055 PT Individual Time Calculation (min): 55 min   Short Term Goals: Week 6:  PT Short Term Goal 1 (Week 6): STG = LTG due to ELOS  Skilled Therapeutic Interventions/Progress Updates:    Pt received supine in bed, awake and agreeable to PT session wtihout c/o pain. Pt with saturated brief on arrival. Required maxA for pericare and removing and donning briefs. Required modA for donning pants. Supine<>sit with supervision without bed features, effortful. Performed squat<>pivot with modA from EOB to w/c and then wheeled sinkside where he was requesting to perform oral care, was able to complete oral hygiene with setupA. W/c transport for time management to ortho gym. Performed squat<>pivot with modA from w/c to EOB, cues for head/hip, foot placement, and general sequencing. Requires frequent cues for forward weight shift due to truncal extensor tone. Once sitting EOM, performed repeated sit<>stands with CGA/minA, his hand on therapists' shoulder with therapist sitting on stool in order to promote the forward weight shift; good carryover. Then performed standing with overhead reaching using large therapy ball, focusing on dynamic standing balance and BUE coordination, requiring minA for standing balance. Then in sitting, performed chop/lift patterns with large therapy ball as well as thoracic rotations with large therapy ball. Then practiced lateral scooting L<>R along mat table with multi-modal cueing for step-by-step sequencing; limited carryover. Ended session by performed repeated squat<>pivot transfers from mat table to/from w/c. Requires modA for transfers to the L and minA for transfers to the R. Returned to room in w/c where he remained seated in w/c at end of session, needs in reach, safety belt alarm on.   Therapy  Documentation Precautions:  Precautions Precautions: Fall Precaution Comments: left side ataxia and weakness Restrictions Weight Bearing Restrictions: No  Therapy/Group: Individual Therapy  Lillieanna Tuohy P Chino Sardo PT 11/02/2020, 8:45 AM

## 2020-11-03 ENCOUNTER — Inpatient Hospital Stay: Payer: Medicaid Other

## 2020-11-03 ENCOUNTER — Inpatient Hospital Stay (HOSPITAL_COMMUNITY): Payer: Self-pay | Admitting: Occupational Therapy

## 2020-11-03 ENCOUNTER — Inpatient Hospital Stay (HOSPITAL_COMMUNITY): Payer: Self-pay

## 2020-11-03 ENCOUNTER — Inpatient Hospital Stay (HOSPITAL_COMMUNITY): Payer: Self-pay | Admitting: Speech Pathology

## 2020-11-03 DIAGNOSIS — Z23 Encounter for immunization: Secondary | ICD-10-CM

## 2020-11-03 LAB — CULTURE, BLOOD (ROUTINE X 2)
Culture: NO GROWTH
Culture: NO GROWTH
Special Requests: ADEQUATE
Special Requests: ADEQUATE

## 2020-11-03 LAB — GASTROINTESTINAL PANEL BY PCR, STOOL (REPLACES STOOL CULTURE)

## 2020-11-03 NOTE — Progress Notes (Signed)
Speech Language Pathology Weekly Progress and Session Note  Patient Details  Name: Zachary Flores MRN: [id number] Date of Birth: Jul 31, 1966  Beginning of progress report period: October 28, 2020 End of progress report period: November 04, 2020  Today's Date: 11/04/2020 SLP Individual Time: 1000-1026 SLP Individual Time Calculation (min): 26 min  Short Term Goals: Week 6: SLP Short Term Goal 1 (Week 6): STG=LTG due to remaining length of stay (pt is pending placement at SNF) - progressing    New Short Term Goals: Week 7: SLP Short Term Goal 1 (Week 7): STG=LTG due to remaining length of stay - d/c to SNF planned 11/11/20  Weekly Progress Updates: Pt has made functional gains and is progressing toward all long term goals. Pt is currently Min assist for basic familiar tasks, but Mod A is required for tasks of mild complexity due to impairments impacting his problem solving, selective attention, and emergent awareness. He also still requires fluctuating Supervision-Min A for use of compensatory strategies for speech intelligibility (primarily slow rate and overarticulation) due to mild dysarthria and difficulty self-monitoring and self-correcting verbal and functional errors. Pt is consuming a regular texture diet with thin liquids and is Mod I - Supervision for use of compensatory swallow strategies to minimize aspiration risk and clearance of right buccal pocketing/residue. Pt education is ongoing; no family has been present for ST sessions and plan is for pt to d/c to SNF due to decreased caregiver abilities. Pt would continue to benefit from skilled ST while inpatient in order to maximize functional independence and reduce burden of care prior to discharge. Pt will need 24/7 supervision at discharge in addition to ST follow up at next level of care - SNF planned for 11/11/20.     Intensity: Minumum of 1-2 x/day, 30 to 90 minutes Frequency: 3 to 5 out of 7 days Duration/Length of Stay:  11/11/20 - SNF Treatment/Interventions: Cognitive remediation/compensation;Internal/external aids;Functional tasks;Patient/family education;Speech/Language facilitation;Therapeutic Activities;Dysphagia/aspiration precaution training;Cueing hierarchy   Daily Session  Skilled Therapeutic Interventions: Pt was seen for skilled ST targeting cognitive skills. SLP facilitated with functional conversation targeting selective attention, safety awareness and recall, as well as verbal problem solving. Pt recalled transfer methods used in PT/OT Mod I, and able to verbally sequence steps of transfers and safety precautions with overall Supervision A question cues from SLP. He selectively attended for entire session with Supervision A verbal cues for redirection as well. Increased Min-Mod A verbal cueing required for anticipatory awareness given hypothetical situations (ex: what would you need to do if your knee started to buckle when standing/walking). Pt continues to stay motivated to work with therapies and increase independence with all aspects of ADLs. Pt was also noted to demonstrate slight increase in self-correcting unintelligible words without decreased cueing from SLP in comparison to previous sessions - more successful use of slow rate and overarticulation with Supervision A verbal cueing. Pt left laying in bed with alarm set and needs within reach. Continue per current plan of care.         Pain Pain Assessment Pain Scale: 0-10  Therapy/Group: Individual Therapy  [name] 11/04/2020, 12:54 PM

## 2020-11-03 NOTE — Progress Notes (Signed)
Physical Therapy Session Note  Patient Details  Name: Brendon Christoffel MRN: [id number] Date of Birth: May 11, 1966  PLAN OF CARE UPDATE:  Pt's plan of care adjusted to QID after speaking with care team and discussed with MD in team conference as pt currently making minimal/slow gains with therapy team. Will continue to follow and progress as appropriate.     Kimball Appleby P Brenley Priore PT 11/03/2020, 11:19 AM

## 2020-11-03 NOTE — Progress Notes (Signed)
Speech Language Pathology Daily Session Note  Patient Details  Name: Zachary Flores MRN: [id number] Date of Birth: [birth year]  Today's Date: 11/03/2020 SLP Individual Time: 5726-2035 SLP Individual Time Calculation (min): 27 min  Short Term Goals: Week 6: SLP Short Term Goal 1 (Week 6): STG=LTG due to remaining length of stay (pt is pending placement at SNF)  Skilled Therapeutic Interventions: Pt was seen for skilled ST targeting speech intelligibility goals. Pt required Min faded to Supervision A verbal cues for intentional use of slow rate and overarticulation compensatory strategies to increase intelligibility during functional conversation regarding novel topic (family holiday traditions) we well as new d/c plans. He was ~90% intelligible throughout session. Pt left laying in bed with alarm set and needs within reach. Continue per current plan of care.        Pain Pain Assessment Pain Scale: 0-10 Pain Score: 0-No pain  Therapy/Group: Individual Therapy  Zachary Flores 11/03/2020, 9:58 AM

## 2020-11-03 NOTE — Progress Notes (Addendum)
Occupational Therapy Weekly Progress Note  Patient Details  Name: Zachary Flores MRN: [id number] Date of Birth: 25-Sep-1966  Beginning of progress report period: October 26, 2020 End of progress report period: November 03, 2020  Today's Date: 11/03/2020 OT Individual Time: 1130-1200; and 1447-1520 OT Individual Time Calculation (min): 30 min; and 33 min    Patient has met 0 of 3 short term goals, progressing but very slow, primarily due to fluctuation in performance secondary to impaired cognition and significant ataxia in LUE and BLE.  Pt also with new onset of gastrointestinal issues this week with pt showing lower activity tolerance.  Working on downgraded transfer technique from stand pivot to squat pivot using drop arm commode to increase safety and independence and pt does demonstrate more consistently min assist level with this technique.  Pt exhibits difficulty carrying over skilled training and still requires a lot of cues during functional transfers and self care for compensatory techniques, body mechanics, hand placement, safety awareness, problem solving.  After collaboration with interdisciplinary team, determined pt would benefit from reduced frequency to QID.    Patient continues to demonstrate the following deficits: muscle weakness, impaired timing and sequencing, abnormal tone, unbalanced muscle activation, ataxia, decreased coordination and decreased motor planning, decreased attention, decreased awareness, decreased problem solving and decreased safety awareness and decreased sitting balance, decreased standing balance, decreased postural control and decreased balance strategies and therefore will continue to benefit from skilled OT intervention to enhance overall performance with BADL.  Patient slowly not progressing toward long term goals.  See goal revision..  Plan of care revisions: Below:.  OT Short Term Goals Week 7:  OT Short Term Goal 1 (Week 7): Pt will complete  toileting with min assist OT Short Term Goal 2 (Week 7): Pt will complete toilet transfer with CGA OT Short Term Goal 3 (Week 7): Pt will complete LB dressing with CGA   OT Long Term Goal Revisions  Problem: RH Toilet Transfers Goal: LTG Patient will perform toilet transfers w/assist (OT) Description: LTG: Patient will perform toilet transfers with assist, with/without cues using equipment (OT) Flowsheets (Taken 11/03/2020 1616) LTG: Pt will perform toilet transfers with assistance level of: Minimal Assistance - Patient > 75% Comment: Downgraded due to pt fluctuation in performance and slow progress.   Problem: RH Tub/Shower Transfers Goal: LTG Patient will perform tub/shower transfers w/assist (OT) Description: LTG: Patient will perform tub/shower transfers with assist, with/without cues using equipment (OT) Flowsheets (Taken 11/03/2020 1616) LTG: Pt will perform tub/shower stall transfers with assistance level of: Minimal Assistance - Patient > 75% LTG: Pt will perform tub/shower transfers from: Walk in shower Comment: Downgraded due to pt fluctuation in performance and slow progress  Skilled Therapeutic Interventions/Progress Updates:    First session: Pt sitting up, reporting feeling tired because of diarrhea, requesting to toilet.  Pt transported to bathroom via w/c, and completed squat pivot transfer to drop arm commode using grab bars, needing step by step VCs for hand placement and body mechanics and needing min assist.  Pt completed sit to stand, clothing mgt, and pericare with min assist. Pt had continent episode of loose stool. Squat pivot to w/c with min assist.  Pt washed hands sitting sinkside with setup, brushed teeth in standing with min assist for transfer and balance, and shaved seated with supervision.  Pt in w/c, call bell in reach, seat belt alarm on.  Second session:  Pt sitting up, requesting to toilet.   Pt transported to bathroom via w/c, and completed  squat pivot  transfer to drop arm commode using grab bars, needing step by step VCs for hand placement and body mechanics and needing min assist.  Pt completed sit to stand, clothing mgt, and pericare with min assist. Pt had continent episode of loose stool. Squat pivot to w/c with min assist.  Pt washed hands sitting sinkside with setup.  Pt reporting fatigued and requesting to defer remainder of session.  Call bell in reach, seat belt alarm on.  Therapy Documentation Precautions:  Precautions Precautions: Fall Precaution Comments: left side ataxia and weakness Restrictions Weight Bearing Restrictions: No   Therapy/Group: Individual Therapy  Ezekiel Slocumb 11/03/2020, 11:45 AM

## 2020-11-03 NOTE — Progress Notes (Signed)
Patient ID: Zachary Flores, male   DOB: 23-Jul-1966, 54 y.o.   MRN: [id number] Met with pt to discuss team conference progress and the plan next week. He is very happy he can go to a SNF to get more rehab and not feel like a burden to his sister. He had his first COVID-pfizer shot today. Continue to work on discharge plan for next week.

## 2020-11-03 NOTE — Plan of Care (Signed)
  Problem: RH Toilet Transfers Goal: LTG Patient will perform toilet transfers w/assist (OT) Description: LTG: Patient will perform toilet transfers with assist, with/without cues using equipment (OT) Flowsheets (Taken 11/03/2020 1616) LTG: Pt will perform toilet transfers with assistance level of: Minimal Assistance - Patient > 75% Comment: Downgraded due to pt fluctuation in performance and slow progress.   Problem: RH Tub/Shower Transfers Goal: LTG Patient will perform tub/shower transfers w/assist (OT) Description: LTG: Patient will perform tub/shower transfers with assist, with/without cues using equipment (OT) Flowsheets (Taken 11/03/2020 1616) LTG: Pt will perform tub/shower stall transfers with assistance level of: Minimal Assistance - Patient > 75% LTG: Pt will perform tub/shower transfers from: Walk in shower Comment: Downgraded due to pt fluctuation in performance and slow progress

## 2020-11-03 NOTE — Plan of Care (Signed)
  Problem: Consults °Goal: RH GENERAL PATIENT EDUCATION °Description: Patient will gain knowledge of disease management, pain management bowel, and bladder management during this admission. °Outcome: Progressing °Goal: Skin Care Protocol Initiated - if Braden Score 18 or less °Description: If consults are not indicated, leave blank or document N/A °Outcome: Progressing °Goal: Nutrition Consult-if indicated °Outcome: Progressing °  °Problem: RH BOWEL ELIMINATION °Goal: RH STG MANAGE BOWEL WITH ASSISTANCE °Description: STG Manage Bowel with Assistance. °Outcome: Progressing °  °Problem: RH BLADDER ELIMINATION °Goal: RH STG MANAGE BLADDER WITH ASSISTANCE °Description: STG Manage Bladder With Assistance °Patient will manage bladder with mod assistance. °Outcome: Progressing °Goal: RH STG MANAGE BLADDER WITH EQUIPMENT WITH ASSISTANCE °Description: STG Manage Bladder With Equipment With Assistance °Outcome: Progressing °  °Problem: RH SKIN INTEGRITY °Goal: RH STG SKIN FREE OF INFECTION/BREAKDOWN °Description: Patients skin will be free of infection and break down during this admission. °Outcome: Progressing °  °Problem: RH SAFETY °Goal: RH STG ADHERE TO SAFETY PRECAUTIONS W/ASSISTANCE/DEVICE °Description: STG Adhere to Safety Precautions With Assistance/Device. °Patient will adhere to safety plan and will not have any fall this admission. °Outcome: Progressing °  °Problem: RH PAIN MANAGEMENT °Goal: RH STG PAIN MANAGED AT OR BELOW PT'S PAIN GOAL °Description: Patients pain will be managed at or below the level of 3 on a 0-10 pain scale during this admission. °Outcome: Progressing °  °Problem: RH KNOWLEDGE DEFICIT GENERAL °Goal: RH STG INCREASE KNOWLEDGE OF SELF CARE AFTER HOSPITALIZATION °Description: Patient will gain knowledge of self care during this admission. °Outcome: Progressing °  °

## 2020-11-03 NOTE — Progress Notes (Signed)
   Covid-19 Vaccination Clinic  Name:  Zachary Flores    MRN: [id number] DOB: [birth year]  11/03/2020  [name] was observed post Covid-19 immunization for 15 minutes without incident. He was provided with Vaccine Information Sheet and instruction to access the V-Safe system.   [name] was instructed to call 911 with any severe reactions post vaccine: Marland Kitchen Difficulty breathing  . Swelling of face and throat  . A fast heartbeat  . A bad rash all over body  . Dizziness and weakness   Immunizations Administered    Name Date Dose VIS Date Route   Pfizer COVID-19 Vaccine 11/03/2020 12:13 PM 0.3 mL 10/13/2020 Intramuscular   Manufacturer: [employer], [employer]   Lot: [id number]   NDC: [address]-6

## 2020-11-03 NOTE — Patient Care Conference (Signed)
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 Maryla Morrow Care Coodinator Present: Chana Bode, RN, BSN, CRRN;Becky Dupree, LCSW Nurse Present: Mauro Kaufmann, LPN OT Present: Dolphus Jenny, OT SLP Present: Suzzette Righter, CF-SLP PPS Coordinator present : Fae Pippin, [place], PT     Current Status/Progress Goal Weekly Team Focus  Bowel/Bladder             Swallow/Nutrition/ Hydration   regular textures, thin liquids, intermittent supervision  Supervision  swallow goals exceeded   ADL's   supervision/setup UB ADLs, min/modA LB dressing/toileting, minA bathing, min/modA functional transfers  CGA to minA  self care training, NMR LUE/LLE, transfer training, FMC LUE, w/c propulsion   Mobility   supervision bed mobility, min/modA stand<>pivot, modA squat pivot. Inconsistent gait with +2 assist. W/c propulsion minA  min/modA goals.  DC planning? Functional transfers. Trying to get him as mod I as possible....Marland KitchenMarland Kitchenplateau  in progress, change ot QD?   Communication   Supervision-Min depending on fatigue and cognitive demands of task involved. 85-90% intelligible  Supervision  carryover speech intelligibility strategies, self monitoring   Safety/Cognition/ Behavioral Observations  Supervision-Min A basic and familiar, Min-Mod emergent awarenss and semi-compelx tasks  Supervision-Min  semi-complex problem solving, error awareness   Pain             Skin               Discharge Planning:  Potential bed at [facility] wil not be available until 11/18, due to COVID outbreak. Sister aware   Team Discussion: Gastroenteritis improved. Denies pain. Continue to note cognitive issues and ataxia. Progress limited by poor carryover. Do not anticipate patient to be a functional ambulator at discharge. Patient on target to meet rehab goals: Yes; currently min-mod assist  *See Care Plan and progress notes for long and short-term goals.   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  Barriers to Discharge: Medical stability;Decreased family/caregiver support;Other (comments)  Barriers to Discharge Comments: hx of substance abuse for SNF, GI virus Possible Resolutions to [employer] Focus:  Therapies, contintue to adjust bowel meds as necessary, monitor BP, continue to advance diet as tolerated, follow GI symptoms   Continued Need for Acute Rehabilitation Level of Care: The patient requires daily  medical management by a physician with specialized training in physical medicine and rehabilitation for the following reasons: Direction of a multidisciplinary physical rehabilitation program to maximize functional independence : Yes Medical management of patient stability for increased activity during participation in an intensive rehabilitation regime.: Yes Analysis of laboratory values and/or radiology reports with any subsequent need for medication adjustment and/or medical intervention. : Yes   I attest that I was present, lead the team conference, and concur with the assessment and plan of the team.   Chana Bode B 11/03/2020, 3:43 PM

## 2020-11-03 NOTE — Progress Notes (Signed)
Physical Therapy Session Note  Patient Details  Name: Zachary Flores MRN: [id number] Date of Birth: 11-Feb-1966  Today's Date: 11/03/2020 PT Individual Time: 1000-1058 + 1300-1330 PT Individual Time Calculation (min): 58 min  + 30 min  Short Term Goals: Week 6:  PT Short Term Goal 1 (Week 6): STG = LTG due to ELOS  Skilled Therapeutic Interventions/Progress Updates:     1st session: Pt received supine in bed, awake and agreeable to therapy session, no c/o pain but does report general stomach discomfort. He also reports moderate fatigue from poor nights sleep. Donned pants with modA while supine. Supine<>sit with supervision with HOB flat, use of bedrail. Removed dirty shirt with supervision, able to don clean shirt with supervision although requiring extra time for completion. Squat<>pivot transfer with minA, towards his R side, with cues for hand and foot placement, as well as general sequencing. As usual, pt requesting to brush teeth. Wheeled sinkside where he completed this with setupA, using RUE. W/c transport for time management from his room to day room gym. Performed sit<>stand transfer at high table with minA from w/c level, performed static standing with min guard while using LUE for completing ring arch, emphasizing LUE coordination, dynamic standing balance. He then completed Kinetron while seated in w/c, therapist providing verbal cues for cadence, focusing on BLE coordination and and B hip extension. Pt then reporting stomach discomfort, requesting to return to room. Propelled himself using BLE's in w/c, requiring minA, back to his room where he remained seated in chair with safety belt alarm on, needs in reach.  2nd session: Pt received sitting in w/c, agreeable to PT session. Reports mild L shldr pain 2/2 recent covid vaccination. W/c transport for time management from his room to day room gym. Performed squat<>pivot transfer with modA from w/c to mat table. Performed lateral  scooting L<>R along mat table focusing on slowed sequencing of technique with hopes of carrying over into functional squat pivot transfers. He also performed repeated sit<>stands with mostly minA and R knee block. Ended session performed blocked practice squat pivot transfers from mat table to w/c; a few occassions where he was able to perform with minA but mostly requiring modA. Transfers better to his R side with ability to use RUE to armrest. Returned to his room in his w/c, remained seated in w/c with needs in reach, safety belt alarm on.   Therapy Documentation Precautions:  Precautions Precautions: Fall Precaution Comments: left side ataxia and weakness Restrictions Weight Bearing Restrictions: No  Therapy/Group: Individual Therapy  Zachary Flores Zachary Flores PT 11/03/2020, 7:39 AM

## 2020-11-03 NOTE — Progress Notes (Signed)
[place] PHYSICAL MEDICINE & REHABILITATION PROGRESS NOTE   Subjective/Complaints: Patient seen laying in bed this AM.  He states he slept well overnight.  He states he had loose stools, but only 2 BMs yesterday.  He is appreciative and aware of hopeful discharge on 11/18.   ROS: Denies CP, SOB, N/V/D  Objective:   No results found. No results for input(s): WBC, HGB, HCT, PLT in the last 72 hours. No results for input(s): NA, K, CL, CO2, GLUCOSE, BUN, CREATININE, CALCIUM in the last 72 hours.  Intake/Output Summary (Last 24 hours) at 11/03/2020 1107 Last data filed at 11/03/2020 0752 Gross per 24 hour  Intake 582 ml  Output 400 ml  Net 182 ml    Physical Exam: Vital Signs Blood pressure 118/75, pulse 67, temperature 98.3 F (36.8 C), resp. rate 18, height 5\' 7"  (1.702 m), weight 78.4 kg, SpO2 95 %.  Constitutional: No distress . Vital signs reviewed. HENT: Normocephalic.  Atraumatic. Eyes: EOMI. No discharge. Cardiovascular: No JVD.  RRR. Respiratory: Normal effort.  No stridor.  Bilateral clear to auscultation. GI: Non-distended.  BS +. Skin: Warm and dry.  Intact. Psych: Normal mood.  Normal behavior. Musc: No edema in extremities.  No tenderness in extremities. Neuro: Alert Dysarthric speech, stable Follows simple commands.   Limited awareness of deficits  Left neglect, stable Left sided apraxia and ataxia, stable Motor: RUE: 4+/5 proximal distal, stable RLE: 4-4+/5 proximal distal, unchanged LUE: 4+/5 proximal distal, stable LLE: Hip flexion, knee extension 4/5, ankle dorsiflexion 3+/5 Left upper extremity dysmetria and apraxia, unchanged  Assessment/Plan: 1. 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  Physiatrist is providing close team supervision and 24 hour management of active medical problems listed below.  Physiatrist and rehab team continue  to assess barriers to discharge/monitor patient progress toward functional and medical goals    Medical Problem List and Plan: 1.  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  Antithrombotics: -DVT/anticoagulation: SCDs             -antiplatelet therapy: N/A 3. Pain Management: Tylenol as needed for headaches.  Controlled with meds on 11/10 4. Mood: Advised emotional support             -antipsychotic agents: Seroquel 25 mg nightly DC'd 5. Neuropsych: This patient is is?  Fully capable of making decisions on his own behalf. 6. Skin/Wound Care: foam dressing, continue pressure relief. 7. Fluids/Electrolytes/Nutrition: Routine in and outs.    BMP within normal limits on 11/2 8.  Post stroke dysphagia.      Advanced to regular thins, now with intermittent supervision  Overall good po intake  Continue to advance diet as tolerated 9.  History of hypertension.    Lopressor 25 mg twice daily, decreased to 12.5 twice daily on 9/28, DC'd on 9/30  Lisinopril DC'd Vitals:   11/02/20 2012 11/03/20 0540  BP: 135/73 118/75  Pulse: 75 67  Resp: 18 18  Temp: 98.5 F (36.9 C) 98.3 F (36.8 C)  SpO2: 94% 95%   Controlled on 11/10 10.  Polysubstance abuse-alcohol, tobacco, cocaine abuse.  Monitor for any signs of withdrawal.    Supplement thiamine and folic acid  Counsel 11. Urinary retention/urinary incontinence:   Periods of incontinence incontinence  PVRs improved  Will avoid medications at present given blood pressure  Continue to monitor 12.  Thrombocytosis: Resolved  Platelets 362 on 10/15 13.  Slow transit constipation  Bowel meds increased on 10/18, increased again on 10/23, increased again on 10/24,  D/ced  Improving 14.  Fever: On 11/7-?  Resolved  -ucx C Diff negative, CXR,KUB unremarkable, WBC's within normal limits  -monitor  for any further signs or symptoms  LOS: 49 days A FACE TO FACE EVALUATION WAS PERFORMED  Constanza Mincy [name] 11/03/2020, 11:07 AM

## 2020-11-04 ENCOUNTER — Inpatient Hospital Stay (HOSPITAL_COMMUNITY): Payer: Self-pay

## 2020-11-04 ENCOUNTER — Inpatient Hospital Stay (HOSPITAL_COMMUNITY): Payer: Self-pay | Admitting: Occupational Therapy

## 2020-11-04 ENCOUNTER — Inpatient Hospital Stay (HOSPITAL_COMMUNITY): Payer: Self-pay | Admitting: Speech Pathology

## 2020-11-04 DIAGNOSIS — A020 Salmonella enteritis: Secondary | ICD-10-CM

## 2020-11-04 NOTE — Progress Notes (Addendum)
Patient ID: Zachary Flores, male   DOB: 1966-11-27, 54 y.o.   MRN: [id number] Have left message for [name]-sister regarding pt getting to the NH next week. Will make sure pt can ride in a car to the facility. Therapy team feels he can.  1:36 PM MD in agreement and feels pt can ride in a private vehicle for transfer to SNF next week.

## 2020-11-04 NOTE — Plan of Care (Signed)
  Problem: RH BOWEL ELIMINATION Goal: RH STG MANAGE BOWEL WITH ASSISTANCE Description: STG Manage Bowel with Assistance. Outcome: Progressing   Problem: RH SKIN INTEGRITY Goal: RH STG SKIN FREE OF INFECTION/BREAKDOWN Description: Patients skin will be free of infection and break down during this admission. Outcome: Progressing   Problem: RH BLADDER ELIMINATION Goal: RH STG MANAGE BLADDER WITH ASSISTANCE Description: STG Manage Bladder With Assistance Patient will manage bladder with mod assistance. Outcome: Progressing Goal: RH STG MANAGE BLADDER WITH EQUIPMENT WITH ASSISTANCE Description: STG Manage Bladder With Equipment With Assistance Outcome: Progressing

## 2020-11-04 NOTE — Progress Notes (Signed)
Occupational Therapy Session Note  Patient Details  Name: Zachary Flores MRN: [id number] Date of Birth: [birth year]  Today's Date: 11/04/2020 OT Individual Time: 1300-1330 OT Individual Time Calculation (min): 30 min    Short Term Goals: Week 7:  OT Short Term Goal 1 (Week 7): Pt will complete toileting with min assist OT Short Term Goal 2 (Week 7): Pt will complete toilet transfer with CGA OT Short Term Goal 3 (Week 7): Pt will complete LB dressing with CGA  Skilled Therapeutic Interventions/Progress Updates:    Pt supine in bed, requesting to get dressed into clean clothes; refusing bathing at this time.  No c/o pain.  Pt doffed/donned shirt overhead sitting EOB with setup and increased time.  Pt donned pants, EOB to thread over BLE, then supine to pull over hips with supervision for problem solving assist and increased time due to ataxic movements.  Pt attempted to donn shoes sitting EOB, however with difficulty therefore instructed pt through supine level donning and pt able to complete with supervision and increased time.  Pt completed squat pivot EOB to w/c with min assist.  Call bell in reach, bed alarm on.    Therapy Documentation Precautions:  Precautions Precautions: Fall Precaution Comments: left side ataxia and weakness Restrictions Weight Bearing Restrictions: No   Therapy/Group: Individual Therapy  Amie Critchley 11/04/2020, 4:10 PM

## 2020-11-04 NOTE — Progress Notes (Signed)
Physical Therapy Weekly Progress Note  Patient Details  Name: Zachary Flores MRN: [id number] Date of Birth: 09/03/1966  Beginning of progress report period: October 28, 2020 End of progress report period: November 04, 2020  Today's Date: 11/04/2020 PT Individual Time: 1500-1530 PT Individual Time Calculation (min): 30 min   Patient has met 3 of 10 long term goals. Pt making slow progress towards LTG. Goals have been revised, see goal revision in LTG POC note. Pt is able to complete bed mobility with supervision, squat<>pivot transfers with min/modA, sit<>stand transfers with minA. He can propel himself, using BLE's only, in w/c on level surfaces 152f with minA. Therapy has been focusing on maximizing mobility to attempt to progress to mod I, therefore have deferred gait training until other goals have been met.   Patient continues to demonstrate the following deficits muscle weakness, impaired timing and sequencing, abnormal tone, unbalanced muscle activation, motor apraxia, ataxia, decreased coordination and decreased motor planning and decreased attention, decreased awareness, decreased problem solving, decreased safety awareness and decreased memory and therefore will continue to benefit from skilled PT intervention to increase functional independence with mobility.  Patient not progressing toward long term goals.  See goal revision..    PT Short Term Goals Week 7:  PT Short Term Goal 1 (Week 7): STG = LTG due to ELOS  Skilled Therapeutic Interventions/Progress Updates:    Pt received sitting in w/c, agreeable to PT session without c/o pain. W/c transport for time management from his room to ortho gym. Focused session on blocked practice squat pivot transfers from w/c to mat table. This was repeated several times, fluctuating b/w minA and light modA. He continues to struggle with general sequencing due to ataxia and decreased safety awareness. Limited carryover from prior sessions  regarding proper setup. Shows difficulty clearing hips during transfer, requiring TC's for improved efficiency. Returned to his room where he remained seated in chair with safety belt alarm on, needs in reach.  Therapy Documentation Precautions:  Precautions Precautions: Fall Precaution Comments: left side ataxia and weakness Restrictions Weight Bearing Restrictions: No  Therapy/Group: Individual Therapy  Zachary Flores PT 11/04/2020, 7:40 AM

## 2020-11-04 NOTE — Progress Notes (Signed)
[place] PHYSICAL MEDICINE & REHABILITATION PROGRESS NOTE   Subjective/Complaints: Patient seen laying in bed this AM.  He states he slept well overnight.  He states he is feeling better.  He denies abdominal discomfort.   ROS: Denies CP, SOB, N/V/D  Objective:   No results found. No results for input(s): WBC, HGB, HCT, PLT in the last 72 hours. No results for input(s): NA, K, CL, CO2, GLUCOSE, BUN, CREATININE, CALCIUM in the last 72 hours.  Intake/Output Summary (Last 24 hours) at 11/04/2020 1107 Last data filed at 11/04/2020 0749 Gross per 24 hour  Intake 482 ml  Output 450 ml  Net 32 ml    Physical Exam: Vital Signs Blood pressure 108/75, pulse 60, temperature 98.8 F (37.1 C), temperature source Oral, resp. rate 18, height 5\' 7"  (1.702 m), weight 78.4 kg, SpO2 100 %.  Constitutional: No distress . Vital signs reviewed. HENT: Normocephalic.  Atraumatic. Eyes: EOMI. No discharge. Cardiovascular: No JVD.  RRR. Respiratory: Normal effort.  No stridor.  Bilateral clear to auscultation. GI: Non-distended.  BS +. Skin: Warm and dry.  Intact. Psych: Normal mood.  Normal behavior. Musc: No edema in extremities.  No tenderness in extremities. Neuro: Alert Dysarthric speech, stable Follows simple commands.   Limited awareness of deficits  Left neglect, stable Left sided apraxia and ataxia, stable Motor: RUE: 4+/5 proximal distal, stable RLE: 4-4+/5 proximal distal, unchanged LUE: 4+/5 proximal distal, unchanged LLE: Hip flexion, knee extension 4/5, ankle dorsiflexion 4+/5 Left upper extremity dysmetria and apraxia, unchanged  Assessment/Plan: 1. 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  Physiatrist is providing close team supervision and 24 hour management of active medical problems listed below.  Physiatrist and rehab team continue to assess barriers to  discharge/monitor patient progress toward functional and medical goals    Medical Problem List and Plan: 1.  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  Antithrombotics: -DVT/anticoagulation: SCDs             -antiplatelet therapy: N/A 3. Pain Management: Tylenol as needed for headaches.  Controlled with meds on 11/11 4. Mood: Advised emotional support             -antipsychotic agents: Seroquel 25 mg nightly DC'd 5. Neuropsych: This patient is is?  Fully capable of making decisions on his own behalf. 6. Skin/Wound Care: foam dressing, continue pressure relief. 7. Fluids/Electrolytes/Nutrition: Routine in and outs.    BMP within normal limits on 11/2 8.  Post stroke dysphagia.      Advanced to regular thins, now with intermittent supervision  Overall good po intake  Continue to advance diet as tolerated 9.  History of hypertension.    Lopressor 25 mg twice daily, decreased to 12.5 twice daily on 9/28, DC'd on 9/30  Lisinopril DC'd Vitals:   11/03/20 2002 11/04/20 0506  BP: 95/66 108/75  Pulse: 66 60  Resp: 18 18  Temp: 98.9 F (37.2 C) 98.8 F (37.1 C)  SpO2: 97% 100%   Soft on 11/11 10.  Polysubstance abuse-alcohol, tobacco, cocaine abuse.  Monitor for any signs of withdrawal.    Supplement thiamine and folic acid  Counsel 11. Urinary retention/urinary incontinence:   Periods of incontinence incontinence  PVRs improved  Will avoid medications at present given blood pressure  Continue to monitor 12.  Thrombocytosis: Resolved  Platelets 362 on 10/15 13.  Slow transit constipation  Bowel meds increased on 10/18, increased again on 10/23, increased again on 10/24,  D/ced  Stable  See #14 14.  Salmonella gastroenteritis  Fever: On 11/7- Resolved  -ucx C Diff negative, CXR,KUB unremarkable, WBC's within normal  limits  -monitor for any further signs or symptoms  LOS: 50 days A FACE TO FACE EVALUATION WAS PERFORMED  Kitzia Camus [name] 11/04/2020, 11:07 AM

## 2020-11-04 NOTE — Plan of Care (Signed)
  Problem: RH Stairs Goal: LTG Patient will ambulate up and down stairs w/assist (PT) Description: LTG: Patient will ambulate up and down # of stairs with assistance (PT) Outcome: Not Applicable   Problem: RH Balance Goal: LTG Patient will maintain dynamic standing balance (PT) Description: LTG:  Patient will maintain dynamic standing balance with assistance during mobility activities (PT) Flowsheets (Taken 11/04/2020 0750) LTG: Pt will maintain dynamic standing balance during mobility activities with:: Minimal Assistance - Patient > 75%   Problem: RH Bed to Chair Transfers Goal: LTG Patient will perform bed/chair transfers w/assist (PT) Description: LTG: Patient will perform bed to chair transfers with assistance (PT). Flowsheets (Taken 11/04/2020 0750) LTG: Pt will perform Bed to Chair Transfers with assistance level: Moderate Assistance - Patient 50 - 74%   Problem: RH Ambulation Goal: LTG Patient will ambulate in controlled environment (PT) Description: LTG: Patient will ambulate in a controlled environment, # of feet with assistance (PT). Flowsheets (Taken 11/04/2020 0750) LTG: Pt will ambulate in controlled environ  assist needed:: Maximal Assistance - Patient 25 - 49% LTG: Ambulation distance in controlled environment: 19ft Goal: LTG Patient will ambulate in home environment (PT) Description: LTG: Patient will ambulate in home environment, # of feet with assistance (PT). Flowsheets (Taken 11/04/2020 0750) LTG: Pt will ambulate in home environ  assist needed:: Maximal Assistance - Patient 25 - 49% LTG: Ambulation distance in home environment: 103ft   Problem: RH Wheelchair Mobility Goal: LTG Patient will propel w/c in controlled environment (PT) Description: LTG: Patient will propel wheelchair in controlled environment, # of feet with assist (PT) Flowsheets (Taken 11/04/2020 0750) LTG: Pt will propel w/c in controlled environ  assist needed:: Contact Guard/Touching assist LTG:  Propel w/c distance in controlled environment: 140ft Goal: LTG Patient will propel w/c in home environment (PT) Description: LTG: Patient will propel wheelchair in home environment, # of feet with assistance (PT). Flowsheets (Taken 11/04/2020 0750) LTG: Pt will propel w/c in home environ  assist needed:: Contact Guard/Touching assist LTG: Propel w/c distance in home environment: 52ft

## 2020-11-05 ENCOUNTER — Inpatient Hospital Stay (HOSPITAL_COMMUNITY): Payer: Self-pay

## 2020-11-05 ENCOUNTER — Inpatient Hospital Stay (HOSPITAL_COMMUNITY): Payer: Self-pay | Admitting: Occupational Therapy

## 2020-11-05 NOTE — Progress Notes (Signed)
Speech Language Pathology Daily Session Note  Patient Details  Name: Zachary Flores MRN: [id number] Date of Birth: 28-May-1966  Today's Date: 11/05/2020 SLP Individual Time: 8916-9450 SLP Individual Time Calculation (min): 29 min  Short Term Goals: Week 7: SLP Short Term Goal 1 (Week 7): STG=LTG due to remaining length of stay; pending SNF placement  Skilled Therapeutic Interventions: Pt seen for ongoing tx of speech/language/cognitive function goals. SLP facilitated functional conversation targeting verbal problem solving and recall. Pt able to solve semi-complex problems related to current and discharge environment, benefiting from minimal verbal and visual cues. Pt demonstrates good awareness of current deficits and impacts on safety and mobility. Pt able to recall 1/3 compensatory strategies to increase speech intelligiblity, required Mod faded to Min A verbal cues to utilize throughout conversational speech. Speech was ~75% intelligible this date, likely less intelligible from previous date d/t lethargy/PM treatment time. Pt requesting water and urinal brought within arms reach, SLP setting up room for safety and pt left in bed with alarm set and all needs met. Cont ST POC.  Pain  None reported  Therapy/Group: Individual Therapy  Dewaine Conger 11/05/2020, 3:32 PM

## 2020-11-05 NOTE — Progress Notes (Signed)
Physical Therapy Session Note  Patient Details  Name: Zachary Flores MRN: [id number] Date of Birth: Jul 24, 1966  Today's Date: 11/05/2020 PT Individual Time: 3762-8315 PT Individual Time Calculation (min): 42 min   Short Term Goals: Week 7:  PT Short Term Goal 1 (Week 7): STG = LTG due to ELOS  Skilled Therapeutic Interventions/Progress Updates:    Pt received supine in bed, awake and agreeable to therapy session. Pt reports saturated brief on arrival. Required totalA for removing brief and maxA for donning clean brief. Required totalA for posterior pericare and able to perform front pericare with setupA with RUE with soaped washcloth. Required modA for donning pants with assist for threading BLE's. Supine<>sit with supervision and HOB flat, heavily reliant of RUE on bedrail. Able to sit EOB with supervision where he removed dirty shirt with supervision and donned new shirt with setupA. Required maxA for donning L shoe and modA for donning R shoe while seated EOB. Squat<>pivot with minA from EOB to w/c, towards R side with mod cues for sequencing and setup. Pt requesting to brush teeth, wheeled sinkside in w/c where he performed oral care with setupA using RUE. Focused remainder of session on functional car transfers. Performed x4 car transfers via squat<>pivot method, requiring min/modA for completion. Instruction and demonstration provided to assist with carryover, increased difficulty going to his L more so than his R. Returned to his room in his w/c where he remained seated in chair with needs in reach, safety belt alarm on.  Therapy Documentation Precautions:  Precautions Precautions: Fall Precaution Comments: left side ataxia and weakness Restrictions Weight Bearing Restrictions: No   Therapy/Group: Individual Therapy  Betha Shadix P Dwayna Kentner PT 11/05/2020, 9:24 AM

## 2020-11-05 NOTE — Progress Notes (Signed)
Occupational Therapy Session Note  Patient Details  Name: Zachary Flores MRN: [id number] Date of Birth: July 17, 1966  Today's Date: 11/05/2020 OT Individual Time: 1300-1330 OT Individual Time Calculation (min): 30 min    Short Term Goals: Week 6:  OT Short Term Goal 1 (Week 6): Pt will complete toileting with min assist OT Short Term Goal 1 - Progress (Week 6): Progressing toward goal OT Short Term Goal 2 (Week 6): Pt will complete toilet transfer with CGA OT Short Term Goal 2 - Progress (Week 6): Progressing toward goal (squat pivot transfer) OT Short Term Goal 3 (Week 6): Pt will complete LB dressing with CGA OT Short Term Goal 3 - Progress (Week 6): Progressing toward goal Week 7:  OT Short Term Goal 1 (Week 7): Pt will complete toileting with min assist OT Short Term Goal 2 (Week 7): Pt will complete toilet transfer with CGA OT Short Term Goal 3 (Week 7): Pt will complete LB dressing with CGA  Skilled Therapeutic Interventions/Progress Updates:    Pt sitting up in w/c, agreeable to OT session, requesting to toilet.  Pt completed toilet transfer squat pivot w/c<>3 in 1 drop arm commode with min assist using grab bars.  Pt completed toileting with CGA and VCs for safety and body mechanics.  Pt washed hands sitting sinkside with setup.  OT laced bilateral shoes with elastic shoe laces to increase pt ease and independence donning/doffing tennis shoes.  Pt required increased time and mod assist to donn shoes today sitting in w/c primarily due to increased distractibility. Pt performs more independently at bed level.  Call bell in reach, seat belt alarm on.  Therapy Documentation Precautions:  Precautions Precautions: Fall Precaution Comments: left side ataxia and weakness Restrictions Weight Bearing Restrictions: No   Therapy/Group: Individual Therapy  Amie Critchley 11/05/2020, 10:58 AM

## 2020-11-05 NOTE — Progress Notes (Signed)
Stool sample collected per order. Sample hand delivered to Lab at 1922.

## 2020-11-05 NOTE — Progress Notes (Signed)
Restful throughout shift, denies pain or discomfort, Continue medical regime.State stool specimen pending

## 2020-11-05 NOTE — Progress Notes (Signed)
[place] PHYSICAL MEDICINE & REHABILITATION PROGRESS NOTE   Subjective/Complaints: Patient seen laying in bed this AM.  He states he slept well overnight.  He updates me on the recent trades on football.    ROS: Denies CP, SOB, N/V/D  Objective:   No results found. No results for input(s): WBC, HGB, HCT, PLT in the last 72 hours. No results for input(s): NA, K, CL, CO2, GLUCOSE, BUN, CREATININE, CALCIUM in the last 72 hours.  Intake/Output Summary (Last 24 hours) at 11/05/2020 1127 Last data filed at 11/05/2020 0829 Gross per 24 hour  Intake 1022 ml  Output 420 ml  Net 602 ml    Physical Exam: Vital Signs Blood pressure 104/70, pulse 65, temperature 98.5 F (36.9 C), temperature source Oral, resp. rate 18, height 5\' 7"  (1.702 m), weight 78.4 kg, SpO2 96 %.  Constitutional: No distress . Vital signs reviewed. HENT: Normocephalic.  Atraumatic. Eyes: EOMI. No discharge. Cardiovascular: No JVD.  RRR. Respiratory: Normal effort.  No stridor.  Bilateral clear to auscultation. GI: Non-distended.  BS +. Skin: Warm and dry.  Intact. Psych: Normal mood.  Normal behavior. Musc: No edema in extremities.  No tenderness in extremities. Neuro: Alert Dysarthric speech, unchanged Follows simple commands.   Limited awareness of deficits  Left neglect, stable Left sided apraxia and ataxia, stable Motor: RUE: 4+/5 proximal distal, stable RLE: 4-4+/5 proximal distal, unchanged LUE: 4+/5 proximal distal, stable LLE: Hip flexion, knee extension 4/5, ankle dorsiflexion 4+/5 Left upper extremity dysmetria and apraxia, unchanged  Assessment/Plan: 1. 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  Physiatrist is providing close team supervision and 24 hour management of active medical problems listed below.  Physiatrist and rehab team continue to assess barriers to discharge/monitor  patient progress toward functional and medical goals    Medical Problem List and Plan: 1.  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  Antithrombotics: -DVT/anticoagulation: SCDs             -antiplatelet therapy: N/A 3. Pain Management: Tylenol as needed for headaches.  Controlled with meds on 11/12 4. Mood: Advised emotional support             -antipsychotic agents: Seroquel 25 mg nightly DC'd 5. Neuropsych: This patient is is?  Fully capable of making decisions on his own behalf. 6. Skin/Wound Care: foam dressing, continue pressure relief. 7. Fluids/Electrolytes/Nutrition: Routine in and outs.    BMP within normal limits on 11/2 8.  Post stroke dysphagia.      Advanced to regular thins, now with intermittent supervision  Overall good po intake  Continue to advance diet as tolerated 9.  History of hypertension.    Lopressor 25 mg twice daily, decreased to 12.5 twice daily on 9/28, DC'd on 9/30  Lisinopril DC'd Vitals:   11/04/20 1953 11/05/20 0310  BP: 105/67 104/70  Pulse: 70 65  Resp: 18 18  Temp: 98.9 F (37.2 C) 98.5 F (36.9 C)  SpO2: 95% 96%   Soft, but asymptomatic on 11/12 10.  Polysubstance abuse-alcohol, tobacco, cocaine abuse.  Monitor for any signs of withdrawal.    Supplement thiamine and folic acid  Counsel 11. Urinary retention/urinary incontinence:   Periods of incontinence, improved  PVRs improved, d/ced  Will avoid medications at present given  blood pressure  Continue to monitor 12.  Thrombocytosis: Resolved  Platelets 362 on 10/15 13.  Slow transit constipation  Bowel meds increased on 10/18, increased again on 10/23, increased again on 10/24,  D/ced  Stable  See #14  Improving 14.  Salmonella gastroenteritis- Resolved  Fever resolved  -ucx C Diff negative, CXR,KUB unremarkable, WBC's within  normal limits  -monitor for any further signs or symptoms  LOS: 51 days A FACE TO FACE EVALUATION WAS PERFORMED  Toma Arts [name] 11/05/2020, 11:27 AM

## 2020-11-05 NOTE — Plan of Care (Signed)
°  Problem: RH BOWEL ELIMINATION Goal: RH STG MANAGE BOWEL WITH ASSISTANCE Description: STG Manage Bowel with Assistance. Outcome: Progressing   Problem: RH BLADDER ELIMINATION Goal: RH STG MANAGE BLADDER WITH ASSISTANCE Description: STG Manage Bladder With Assistance Patient will manage bladder with mod assistance. Outcome: Progressing Goal: RH STG MANAGE BLADDER WITH EQUIPMENT WITH ASSISTANCE Description: STG Manage Bladder With Equipment With Assistance Outcome: Progressing

## 2020-11-05 NOTE — Progress Notes (Addendum)
Patient ID: Zachary Flores, male   DOB: 1966/09/07, 54 y.o.   MRN: [id number] Spoke with [name]-sister who is arranging transport for pt to get to the [facility]. She reports she can not make her husband who ill,come from [place] to here and then to [place] and back home to [place]. Asked if her sister could take pt, she can not get off of work. She is looking into hiring wheelchair [name] to transport him to [place] next Thursday. Will call her after talking with facility on Monday. She is awaiting return calls from wheelchair [employer]

## 2020-11-06 NOTE — Plan of Care (Signed)
  Problem: RH BOWEL ELIMINATION Goal: RH STG MANAGE BOWEL WITH ASSISTANCE Description: STG Manage Bowel with mod I Assistance. Outcome: Progressing   Problem: RH BLADDER ELIMINATION Goal: RH STG MANAGE BLADDER WITH ASSISTANCE Description: STG Manage Bladder With Assistance Patient will manage bladder with mod assistance. Outcome: Progressing Goal: RH STG MANAGE BLADDER WITH EQUIPMENT WITH ASSISTANCE Description: STG Manage Bladder With Equipment With mod I Assistance Outcome: Progressing

## 2020-11-06 NOTE — Progress Notes (Signed)
[place] PHYSICAL MEDICINE & REHABILITATION PROGRESS NOTE   Subjective/Complaints:   Pt reports doing fine.  LBM yesterday- no cramping, no diarrhea, no nausea.    ROS:  Pt denies SOB, abd pain, CP, N/V/C/D, and vision changes   Objective:   No results found. No results for input(s): WBC, HGB, HCT, PLT in the last 72 hours. No results for input(s): NA, K, CL, CO2, GLUCOSE, BUN, CREATININE, CALCIUM in the last 72 hours.  Intake/Output Summary (Last 24 hours) at 11/06/2020 1438 Last data filed at 11/06/2020 0730 Gross per 24 hour  Intake 480 ml  Output 300 ml  Net 180 ml    Physical Exam: Vital Signs Blood pressure 115/74, pulse 65, temperature 98.6 F (37 C), temperature source Oral, resp. rate 17, height 5\' 7"  (1.702 m), weight 78.4 kg, SpO2 97 %.  Constitutional: No distress . Vital signs reviewed. Sitting up in bed; appropriate, NAD HENT: Normocephalic.  Atraumatic. Eyes: EOMI. No discharge. Cardiovascular: RRR_ no JVD. Respiratory: CTA B/L- no W/R/R- good air movement GI: Soft, NT, ND, (+)BS  Skin: Warm and dry.  Intact. Psych: Normal mood.  Normal behavior. Musc: No edema in extremities.  No tenderness in extremities. Neuro: Alert Dysarthric speech, unchanged Follows simple commands.   Limited awareness of deficits  Left neglect, stable Left sided apraxia and ataxia, stable Motor: RUE: 4+/5 proximal distal, stable RLE: 4-4+/5 proximal distal, unchanged LUE: 4+/5 proximal distal, stable LLE: Hip flexion, knee extension 4/5, ankle dorsiflexion 4+/5 Left upper extremity dysmetria and apraxia, unchanged  Assessment/Plan: 1. 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  Physiatrist is providing close team supervision and 24 hour management of active medical problems listed below.  Physiatrist and rehab team continue to assess barriers to  discharge/monitor patient progress toward functional and medical goals    Medical Problem List and Plan: 1.  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  Antithrombotics: -DVT/anticoagulation: SCDs             -antiplatelet therapy: N/A 3. Pain Management: Tylenol as needed for headaches.  11/13- controlled with tylenol- con't regimen 4. Mood: Advised emotional support             -antipsychotic agents: Seroquel 25 mg nightly DC'd 5. Neuropsych: This patient is is?  Fully capable of making decisions on his own behalf. 6. Skin/Wound Care: foam dressing, continue pressure relief. 7. Fluids/Electrolytes/Nutrition: Routine in and outs.    BMP within normal limits on 11/2 8.  Post stroke dysphagia.      Advanced to regular thins, now with intermittent supervision  Overall good po intake  Continue to advance diet as tolerated 9.  History of hypertension.    Lopressor 25 mg twice daily, decreased to 12.5 twice daily on 9/28, DC'd on 9/30  Lisinopril DC'd Vitals:   11/06/20 0352 11/06/20 1302  BP: 122/83 115/74  Pulse: (!) 55 65  Resp: 18 17  Temp: 97.6 F (36.4 C) 98.6 F (37 C)  SpO2: 95% 97%   11/13- soft to controlled- con't regimen 10.  Polysubstance abuse-alcohol, tobacco, cocaine abuse.  Monitor for any signs of withdrawal.    Supplement thiamine and folic acid  Counsel 11. Urinary retention/urinary incontinence:   Periods of incontinence, improved  PVRs improved, d/ced  Will  avoid medications at present given blood pressure  Continue to monitor 12.  Thrombocytosis: Resolved  Platelets 362 on 10/15 13.  Slow transit constipation  Bowel meds increased on 10/18, increased again on 10/23, increased again on 10/24,  D/ced  Stable  See #14  Improving 14.  Salmonella gastroenteritis- Resolved  Fever resolved  -ucx C Diff  negative, CXR,KUB unremarkable, WBC's within normal limits  -monitor for any further signs or symptoms  11/13- Sx's resolved completely per pt  LOS: 52 days A FACE TO FACE EVALUATION WAS PERFORMED  Zachary Flores 11/06/2020, 2:38 PM

## 2020-11-07 MED ORDER — LORATADINE 10 MG PO TABS
10.0000 mg | ORAL_TABLET | Freq: Every day | ORAL | Status: DC
  Administered 2020-11-07 – 2020-11-11 (×5): 10 mg via ORAL
  Filled 2020-11-07 (×5): qty 1

## 2020-11-07 MED ORDER — ENSURE ENLIVE PO LIQD
237.0000 mL | Freq: Three times a day (TID) | ORAL | Status: DC
  Administered 2020-11-08 – 2020-11-11 (×9): 237 mL via ORAL
  Filled 2020-11-07 (×5): qty 237

## 2020-11-07 NOTE — Plan of Care (Signed)
  Problem: RH BLADDER ELIMINATION Goal: RH STG MANAGE BLADDER WITH ASSISTANCE Description: STG Manage Bladder With Assistance Patient will manage bladder with mod assistance. Outcome: Progressing Goal: RH STG MANAGE BLADDER WITH EQUIPMENT WITH ASSISTANCE Description: STG Manage Bladder With Equipment With mod I Assistance Outcome: Progressing   Problem: RH BOWEL ELIMINATION Goal: RH STG MANAGE BOWEL WITH ASSISTANCE Description: STG Manage Bowel with mod I Assistance. Outcome: Progressing

## 2020-11-08 ENCOUNTER — Inpatient Hospital Stay (HOSPITAL_COMMUNITY): Payer: Self-pay | Admitting: Occupational Therapy

## 2020-11-08 ENCOUNTER — Inpatient Hospital Stay (HOSPITAL_COMMUNITY): Payer: Self-pay | Admitting: Speech Pathology

## 2020-11-08 ENCOUNTER — Inpatient Hospital Stay (HOSPITAL_COMMUNITY): Payer: Self-pay

## 2020-11-08 NOTE — Progress Notes (Signed)
Speech Language Pathology Daily Session Note  Patient Details  Name: Zachary Flores MRN: [id number] Date of Birth: 01/23/1966  Today's Date: 11/09/2020 SLP Individual Time: 1431-1456 SLP Individual Time Calculation (min): 25 min  Short Term Goals: Week 7: SLP Short Term Goal 1 (Week 7): STG=LTG due to remaining length of stay; pending SNF placement  Skilled Therapeutic Interventions: Pt was seen for skilled ST targeting cognitive skills. SLP facilitated session with a familiar card task. Pt demonstrated recall of rules and objective of card game with only Supervision A level verbal cues. Pt only required Supervision A level verbal cues for basic problem solving, and Min A for error awareness within task. He selectively attended to task also with Supervision A verbal cues for redirection. Pt left sitting in chair with alarm set and needs within reach. Continue per current plan of care.          Pain Pain Assessment Pain Scale: 0-10 Pain Score: 0-No pain  Therapy/Group: Individual Therapy  [name] 11/09/2020, 3:02 PM

## 2020-11-08 NOTE — Progress Notes (Signed)
Occupational Therapy Session Note  Patient Details  Name: Zachary Flores MRN: [id number] Date of Birth: [birth year]  Today's Date: 11/08/2020 OT Individual Time: 0915-1000 OT Individual Time Calculation (min): 45 min    Short Term Goals: Week 7:  OT Short Term Goal 1 (Week 7): Pt will complete toileting with min assist OT Short Term Goal 2 (Week 7): Pt will complete toilet transfer with CGA OT Short Term Goal 3 (Week 7): Pt will complete LB dressing with CGA  Skilled Therapeutic Interventions/Progress Updates:    Pt seen this session to complete oral care with set up at the sink. Pt held toothpaste in L hand and opened/closed cap with R hand.   After oral care, focused on BUE coordination with B AROM focusing on symmetrical movement with pt attention to L side to lift arm as high as possible. He did fairly well with activity and encouraged him to continue this in his free time.  Worked on clasped hands with rotating hands side to side for trunk rotation.  To improve transfer skills and L side coordination, worked on forced use of legs with sit to partial squat movement patterns. Pt initially pushing up with R hand only (he states bringing L hand back to reach arm rest hurts his shoulder) and L hand on bed. This put him at an awkward angle that was prohibiting a smooth lift. He also tends to keep his feet forward. Did some ankle ROM for increased dorsiflexion and focused on keeping his feet back slightly for improved wt bearing. Instead of counting 1,2,3 to prepare for semistand, had pt just say out loud "up..down".  He was able to weight shift forward and lift hips with min - mod A 10x in a row, rested and then 10 more times. Pt said he was really tired from those exercises.  Excellent participation.  All needs met. Chair belt alarm on.  Therapy Documentation Precautions:  Precautions Precautions: Fall Precaution Comments: left side ataxia and weakness Restrictions Weight Bearing  Restrictions: No  Pain: Pain Assessment Pain Scale: 0-10 Pain Score: 0-No pain    Therapy/Group: Individual Therapy  [place] 11/08/2020, 8:49 AM

## 2020-11-08 NOTE — Progress Notes (Signed)
Speech Language Pathology Daily Session Note  Patient Details  Name: Zachary Flores MRN: [id number] Date of Birth: Oct 29, 1966  Today's Date: 11/08/2020 SLP Individual Time: 1300-1325 SLP Individual Time Calculation (min): 25 min  Short Term Goals: Week 7: SLP Short Term Goal 1 (Week 7): STG=LTG due to remaining length of stay; pending SNF placement  Skilled Therapeutic Interventions: Pt was seen for skilled ST targeting speech intelligibility goals. SLP facilitated session with various conversation level tasks, during which pt required Min A verbal cues to clarify messages using slower rate and overarticulation. He was ~80% intelligible throughout session. Pt left sitting in wheelchair with alarm set and needs within reach. Continue per current plan of care.          Pain Pain Assessment Pain Scale: 0-10 Pain Score: 0-No pain  Therapy/Group: Individual Therapy  [name] 11/08/2020, 1:29 PM

## 2020-11-08 NOTE — Progress Notes (Signed)
PHYSICAL MEDICINE & REHABILITATION PROGRESS NOTE   Subjective/Complaints: Patient seen sitting up, working on transfers with therapies.  He states he slept well overnight.  He gives me an update on the [employer] football game yesterday.  ROS: Denies chest pain, shortness of breath, nausea, vomiting, diarrhea.  Objective:   No results found. No results for input(s): WBC, HGB, HCT, PLT in the last 72 hours. No results for input(s): NA, K, CL, CO2, GLUCOSE, BUN, CREATININE, CALCIUM in the last 72 hours.  Intake/Output Summary (Last 24 hours) at 11/08/2020 1904 Last data filed at 11/08/2020 1753 Gross per 24 hour  Intake 900 ml  Output 600 ml  Net 300 ml    Physical Exam: Vital Signs Blood pressure 131/83, pulse 68, temperature 99.1 F (37.3 C), resp. rate 17, height 5\' 7"  (1.702 m), weight 78.4 kg, SpO2 (!) 86 %.  Constitutional: No distress . Vital signs reviewed. HENT: Normocephalic.  Atraumatic. Eyes: EOMI. No discharge. Cardiovascular: No JVD.  RRR. Respiratory: Normal effort.  No stridor.  Bilateral clear to auscultation. GI: Non-distended.  BS +. Skin: Warm and dry.  Intact. Psych: Normal mood.  Normal behavior. Musc: No edema in extremities.  No tenderness in extremities. Neuro: Alert Dysarthric speech, stable Follows simple commands.   Limited awareness of deficits  Left neglect, stable Left sided apraxia and ataxia, stable Motor: RUE: 4+/5 proximal distal, stable RLE: 4-4+/5 proximal distal, stable LUE: 4+/5 proximal distal, stable LLE: Hip flexion, knee extension 4/5, ankle dorsiflexion 4+/5 Left upper extremity dysmetria and apraxia, unchanged  Assessment/Plan: 1. 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  Physiatrist is providing close team supervision and 24 hour management of active medical problems listed below.  Physiatrist and  rehab team continue to assess barriers to discharge/monitor patient progress toward functional and medical goals    Medical Problem List and Plan: 1.  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  Antithrombotics: -DVT/anticoagulation: SCDs             -antiplatelet therapy: N/A 3. Pain Management: Tylenol as needed for headaches.  Controlled on 11/15 4. Mood: Advised emotional support             -antipsychotic agents: Seroquel 25 mg nightly DC'd 5. Neuropsych: This patient is is?  Fully capable of making decisions on his own behalf. 6. Skin/Wound Care: foam dressing, continue pressure relief. 7. Fluids/Electrolytes/Nutrition: Routine in and outs.    BMP within normal limits on 11/2 8.  Post stroke dysphagia.      Advanced to regular thins, now with intermittent supervision  Overall good po intake  Continue to advance diet as tolerated 9.  History of hypertension.    Lopressor 25 mg twice daily, decreased to 12.5 twice daily on 9/28, DC'd on 9/30  Lisinopril DC'd Vitals:   11/08/20 0333 11/08/20 1439  BP: 124/80 131/83  Pulse: (!) 55 68  Resp: 18 17  Temp: 98 F (36.7 C) 99.1 F (37.3 C)  SpO2: 97% (!) 86%   Controlled on 11/15 10.  Polysubstance abuse-alcohol, tobacco, cocaine abuse.  Monitor for any signs of withdrawal.    Supplement thiamine and folic acid  Counsel 11. Urinary retention/urinary incontinence:   Periods of incontinence, improved  PVRs improved, d/ced  Will avoid medications  at present given blood pressure  Continue to monitor 12.  Thrombocytosis:   Platelets 424 on 11/5 13.  Slow transit constipation  Bowel meds increased on 10/18, increased again on 10/23, increased again on 10/24,  D/ced  Stable  See #14  Improved 14.  Salmonella gastroenteritis- Resolved  Fever resolved  -ucx C Diff negative,  CXR,KUB unremarkable, WBC's within normal limits  -monitor for any further signs or symptoms  LOS: 54 days A FACE TO FACE EVALUATION WAS PERFORMED  Oseias Horsey [name] 11/08/2020, 7:04 PM

## 2020-11-08 NOTE — Progress Notes (Signed)
Physical Therapy Session Note  Patient Details  Name: Zachary Flores MRN: [id number] Date of Birth: June 15, 1966  Today's Date: 11/08/2020 PT Individual Time: 0800-0828 PT Individual Time Calculation (min): 28 min   Short Term Goals: Week 7:  PT Short Term Goal 1 (Week 7): STG = LTG due to ELOS  Skilled Therapeutic Interventions/Progress Updates:    Pt received supine in bed, awake and agreeable to therapy without c/o pain. Reports good weekend and his sister brought new shoes. Pt with saturated brief on arrival. Required totalA for removing and donning brief with setupA for frontal pericare and totalA for posterior pericare. Able to don pants with modA while seated EOB with assist for both threading and pulling over hips in standing. Doffed shirt with supervision but required minA for donning new shirt, limited by generalized apraxia and LUE ataxia. Required maxA for donning shoes while seated EOB. Performed squat<>pivot with modA from EOB to w/c, towards L side. Transfers continue to be limited by poor safety awareness, decreased insight, impaired motor planning, and decreased ability to recall prior strategies that have been taught to him. He remained seated in chair with safety belt alarm on, needs in reach.  Therapy Documentation Precautions:  Precautions Precautions: Fall Precaution Comments: left side ataxia and weakness Restrictions Weight Bearing Restrictions: No  Therapy/Group: Individual Therapy  Nitzia Perren P Fontaine Hehl PT 11/08/2020, 8:00 AM

## 2020-11-08 NOTE — Progress Notes (Signed)
Patient ID: Zachary Flores, male   DOB: [birth year], 54 y.o.   MRN: [id number] [name]-sister working on getting transportation [name] Niece to take pt to the facility in [place]. Trying to get confirmation of Thursday transfer day. Message left for [name][facility]Adm at [facility].

## 2020-11-08 NOTE — Progress Notes (Signed)
Speech Language Pathology Daily Session Note  Patient Details  Name: Zachary Flores MRN: [id number] Date of Birth: Apr 18, 1966  Today's Date: 11/08/2020 SLP Individual Time: 1430-1455 SLP Individual Time Calculation (min): 25 min  Short Term Goals: Week 7: SLP Short Term Goal 1 (Week 7): STG=LTG due to remaining length of stay; pending SNF placement  Skilled Therapeutic Interventions: Pt was seen for skilled ST targeting cognitive skills. Upon arrival pt expressed fatigue and desire to return to bed. Therefore, SLP assisted with transfer from wheelchair to bed via stedy. This was good opportunity to assess impact of cognitive deficits on mobility and ADLs. His sequencing was adequate, overall Min A verbal cues required for awareness of functional errors such as foot placement and awareness of left lean, but pt able to problem solve these basic situations with decreased Supervision A verbal cues. He selectively attended to functional tasks with Min A verbal cues. Pt left laying in bed with alarm set and needs within reach. Continue per current plan of care.        Pain Pain Assessment Pain Scale: 0-10 Pain Score: 0-No pain  Therapy/Group: Individual Therapy  Zachary Flores 11/08/2020, 2:58 PM

## 2020-11-09 ENCOUNTER — Inpatient Hospital Stay (HOSPITAL_COMMUNITY): Payer: Self-pay | Admitting: Occupational Therapy

## 2020-11-09 ENCOUNTER — Inpatient Hospital Stay (HOSPITAL_COMMUNITY): Payer: Self-pay | Admitting: Speech Pathology

## 2020-11-09 ENCOUNTER — Inpatient Hospital Stay (HOSPITAL_COMMUNITY): Payer: Self-pay

## 2020-11-09 DIAGNOSIS — R001 Bradycardia, unspecified: Secondary | ICD-10-CM

## 2020-11-09 NOTE — Progress Notes (Signed)
[address] PHYSICAL MEDICINE & REHABILITATION PROGRESS NOTE   Subjective/Complaints: Patient seen laying in bed this AM.  He states he slept well overnight.  He denies complaints.  He is not aware of today's date, but is aware of his discharge date.  He asks where he will be going.  ROS: Denies chest pain, shortness of breath, nausea, vomiting, diarrhea.  Objective:   No results found. No results for input(s): WBC, HGB, HCT, PLT in the last 72 hours. No results for input(s): NA, K, CL, CO2, GLUCOSE, BUN, CREATININE, CALCIUM in the last 72 hours.  Intake/Output Summary (Last 24 hours) at 11/09/2020 1221 Last data filed at 11/09/2020 0619 Gross per 24 hour  Intake 740 ml  Output 950 ml  Net -210 ml    Physical Exam: Vital Signs Blood pressure 129/89, pulse 62, temperature 97.8 F (36.6 C), resp. rate 17, height 5\' 7"  (1.702 m), weight 78.4 kg, SpO2 100 %.  Constitutional: No distress . Vital signs reviewed. HENT: Normocephalic.  Atraumatic. Eyes: EOMI. No discharge. Cardiovascular: No JVD.  RRR. Respiratory: Normal effort.  No stridor.  Bilateral clear to auscultation. GI: Non-distended.  BS +. Skin: Warm and dry.  Intact. Psych: Normal mood.  Normal behavior. Musc: No edema in extremities.  No tenderness in extremities. Neuro: Alert Dysarthric speech, unchanged Follows simple commands.   Limited awareness of deficits  Left neglect, stable Left sided apraxia and ataxia, stable Motor: RUE: 4+/5 proximal distal, stable RLE: 4-4+/5 proximal distal, stable LUE: 4+/5 proximal distal, unchanged LLE: Hip flexion, knee extension 4/5, ankle dorsiflexion 4+/5 Left upper extremity dysmetria and apraxia, unchanged MAS: 2/4 in left elbow flexors  Assessment/Plan: 1. 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  Physiatrist is providing close team supervision and 24 hour  management of active medical problems listed below.  Physiatrist and rehab team continue to assess barriers to discharge/monitor patient progress toward functional and medical goals    Medical Problem List and Plan: 1.  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  Antithrombotics: -DVT/anticoagulation: SCDs             -antiplatelet therapy: N/A 3. Pain Management: Tylenol as needed for headaches.  Controlled on 11/16 4. Mood: Advised emotional support             -antipsychotic agents: Seroquel 25 mg nightly DC'd 5. Neuropsych: This patient is is?  Fully capable of making decisions on his own behalf. 6. Skin/Wound Care: foam dressing, continue pressure relief. 7. Fluids/Electrolytes/Nutrition: Routine in and outs.    BMP within normal limits on 11/2 8.  Post stroke dysphagia.      Advanced to regular thins, now with intermittent supervision  Overall good po intake  Continue to advance diet as tolerated 9.  History of hypertension.    Lopressor 25 mg twice daily, decreased to 12.5 twice daily on 9/28, DC'd on 9/30  Lisinopril DC'd Vitals:   11/08/20 1950 11/09/20 0402  BP: 119/77 129/89  Pulse: (!) 59 62  Resp: 18 17  Temp: 98.2 F (36.8 C) 97.8 F (36.6 C)  SpO2: 96% 100%   Controlled on 11/16 10.  Polysubstance abuse-alcohol, tobacco, cocaine abuse.  Monitor for any signs of withdrawal.    Supplement thiamine and folic acid  Counsel 11. Urinary retention/urinary incontinence:   Periods of incontinence, improved  PVRs improved, d/ced  Will avoid medications at present given blood pressure  Continue to monitor 12.  Thrombocytosis:   Platelets 424 on 11/5 13.  Slow transit constipation  Bowel meds increased on 10/18, increased again on 10/23, increased again on 10/24,  D/ced  Stable  See  #14  Overall improved 14.  Salmonella gastroenteritis- Resolved  Fever resolved  -ucx C Diff negative, CXR,KUB unremarkable, WBC's within normal limits  -monitor for any further signs or symptoms 15.  Bradycardia  Borderline bradycardia, but asymptomatic on 11/16  Continue to monitor  LOS: 55 days A FACE TO FACE EVALUATION WAS PERFORMED  Sreenidhi Ganson [name] 11/09/2020, 12:21 PM

## 2020-11-09 NOTE — Plan of Care (Signed)
  Problem: Consults Goal: RH GENERAL PATIENT EDUCATION Description: Patient will gain knowledge of disease management, pain management bowel, and bladder management during this admission. Outcome: Progressing Goal: Skin Care Protocol Initiated - if Braden Score 18 or less Description: If consults are not indicated, leave blank or document N/A Outcome: Progressing Goal: Nutrition Consult-if indicated Outcome: Progressing   Problem: RH BOWEL ELIMINATION Goal: RH STG MANAGE BOWEL WITH ASSISTANCE Description: STG Manage Bowel with mod I Assistance. Outcome: Progressing   Problem: RH BLADDER ELIMINATION Goal: RH STG MANAGE BLADDER WITH ASSISTANCE Description: STG Manage Bladder With Assistance Patient will manage bladder with mod assistance. Outcome: Progressing Goal: RH STG MANAGE BLADDER WITH EQUIPMENT WITH ASSISTANCE Description: STG Manage Bladder With Equipment With mod I Assistance Outcome: Progressing   Problem: RH SKIN INTEGRITY Goal: RH STG SKIN FREE OF INFECTION/BREAKDOWN Description: Patients skin will be free of infection and break down during this admission. Outcome: Progressing   Problem: RH SAFETY Goal: RH STG ADHERE TO SAFETY PRECAUTIONS W/ASSISTANCE/DEVICE Description: STG Adhere to Safety Precautions With Assistance/Device. Patient will adhere to safety plan and will not have any fall this admission. Outcome: Progressing   Problem: RH PAIN MANAGEMENT Goal: RH STG PAIN MANAGED AT OR BELOW PT'S PAIN GOAL Description: Patients pain will be managed at or below the level of 3 on a 0-10 pain scale during this admission. Outcome: Progressing   Problem: RH KNOWLEDGE DEFICIT GENERAL Goal: RH STG INCREASE KNOWLEDGE OF SELF CARE AFTER HOSPITALIZATION Description: Patient will gain knowledge of self care during this admission. Outcome: Progressing

## 2020-11-09 NOTE — Progress Notes (Signed)
Occupational Therapy Session Note  Patient Details  Name: Zachary Flores MRN: [id number] Date of Birth: [birth year]  Today's Date: 11/09/2020 OT Individual Time: 1300-1330 OT Individual Time Calculation (min): 30 min    Short Term Goals: Week 7:  OT Short Term Goal 1 (Week 7): Pt will complete toileting with min assist OT Short Term Goal 2 (Week 7): Pt will complete toilet transfer with CGA OT Short Term Goal 3 (Week 7): Pt will complete LB dressing with CGA  Skilled Therapeutic Interventions/Progress Updates:    Pt supine in bed, no c/o pain.  Needing encouragement for participation throughout session, pt stating intermittently "Im tired of this".  Pt donned shoes at bed level with min assist for left foot for initial threading onto foot.  Pt required increased time to complete as well.  Supine to sit with supervision and squat pivot transfer completed needing min assist.  Pt having difficulty with lateral scoot to left today despite VCs provided needing min assist for repositioning in chair. Pt requesting to shave at sinkside.  Pt completed shaving and brushing teeth with setup.  Pt sitting up in w/c at end of session, call bell in reach, seat belt alarm on.     Therapy Documentation Precautions:  Precautions Precautions: Fall Precaution Comments: left side ataxia and weakness Restrictions Weight Bearing Restrictions: No   Therapy/Group: Individual Therapy  Amie Critchley 11/09/2020, 3:41 PM

## 2020-11-09 NOTE — Progress Notes (Signed)
Physical Therapy Session Note  Patient Details  Name: Zachary Flores MRN: [id number] Date of Birth: 08-03-1966  Today's Date: 11/09/2020 PT Individual Time: 1015-1045 PT Individual Time Calculation (min): 30 min      Short Term Goals: Week 7:  PT Short Term Goal 1 (Week 7): STG = LTG due to ELOS  Skilled Therapeutic Interventions/Progress Updates:    Pt received supine in bed, no c/o pain, agreeable to therapy session. Pt reports clean brief, required minA for donning pants while supine in bed. TotalA required for shoes for time management. Supine<>sit with supervision with use of bed rail. Sit<>stand with minA and required minA for standing balance while he pulled pants over his hips with his RUE. Squat<>pivot with modA from EOB to w/c. Pt wheeled sinkside as he was requesting to brush teeth/wash face. He performed oral care with setupA. Focused remainder of session on w/c mobility. He required min/modA to propel (using BLE's) ~44ft on level surfaces, continues to struggle with feet cadence, coordination, and stroke efficiency. He also continues to have difficulty with gaining traction during w/c propulsion and has limited carryover b/w sessions with this. He was returned to his room where he ended session seated in w/c with safety belt alarm on, needs in reach.  Therapy Documentation Precautions:  Precautions Precautions: Fall Precaution Comments: left side ataxia and weakness Restrictions Weight Bearing Restrictions: (P) No  Therapy/Group: Individual Therapy  Yoshika Vensel P Keeghan Mcintire 11/09/2020, 10:30 AM

## 2020-11-09 NOTE — Progress Notes (Addendum)
Patient ID: Zachary Flores, male   DOB: 08/11/1966, 54 y.o.   MRN: [id number]  Left message for Zachary Flores-[facility] again regarding when can take pt. Spoke with sister-Zachary Flores who reports pt's daughter-Zachary Flores will be transporting pt via car to facility. Sister can complete paperwork as long as can fax or email it to her. Await return call.  12:12 PM Spoke with Zachary Flores-Adm who is asking many questions and trying to back out of bed offer. She wants to speak with Zachary Flores-sister and gave her the number to contact her. Will talk with Administrator if need too.  2:25 PM Spoke with Zachary Flores-sister who has yet to speak to Zachary Flores from the facility. Gave her their number and she will call them to answer their questions.  3;41 Pm have not heard back from Zachary Flores-sister regarding talking with the facility.

## 2020-11-10 ENCOUNTER — Inpatient Hospital Stay (HOSPITAL_COMMUNITY): Payer: Self-pay | Admitting: Speech Pathology

## 2020-11-10 ENCOUNTER — Inpatient Hospital Stay (HOSPITAL_COMMUNITY): Payer: Self-pay

## 2020-11-10 ENCOUNTER — Inpatient Hospital Stay (HOSPITAL_COMMUNITY): Payer: Self-pay | Admitting: Occupational Therapy

## 2020-11-10 LAB — SARS CORONAVIRUS 2 BY RT PCR (HOSPITAL ORDER, PERFORMED IN ~~LOC~~ HOSPITAL LAB): SARS Coronavirus 2: NEGATIVE

## 2020-11-10 MED ORDER — LOPERAMIDE HCL 2 MG PO CAPS: 2.0000 mg | ORAL_CAPSULE | ORAL | 0 refills | Status: AC | PRN

## 2020-11-10 MED ORDER — LORATADINE 10 MG PO TABS: 10.0000 mg | ORAL_TABLET | Freq: Every day | ORAL | Status: DC

## 2020-11-10 MED ORDER — FOLIC ACID 1 MG PO TABS: 1.0000 mg | ORAL_TABLET | Freq: Every day | ORAL | Status: DC

## 2020-11-10 MED ORDER — ACETAMINOPHEN 325 MG PO TABS: 650.0000 mg | ORAL_TABLET | ORAL | Status: DC | PRN

## 2020-11-10 MED ORDER — ADULT MULTIVITAMIN W/MINERALS CH: 1.0000 | ORAL_TABLET | Freq: Every day | ORAL | Status: DC

## 2020-11-10 NOTE — Progress Notes (Signed)
Physical Therapy Session Note  Patient Details  Name: Zachary Flores MRN: [id number] Date of Birth: 1966/01/26  Today's Date: 11/10/2020 PT Individual Time: 1400-1430 PT Individual Time Calculation (min): 30 min   Short Term Goals: Week 7:  PT Short Term Goal 1 (Week 7): STG = LTG due to ELOS  Skilled Therapeutic Interventions/Progress Updates:    Pt greeted sitting in w/c, agreeable to PT session. Pt without pain. Pt reports his sister had brought him new shoes that were Velcro straps and wide width. Pt reporting need to void. Wheeled inside bathroom where he performed stand<>Pivot transfer with modA and use of grab bar from w/c to 3-1 commode. Pt sat on commode with supervision and was continent of bladder. Required modA for standing balance and totalA for pulling pants over hips; pt somewhat impulsive during transfers and requiring cues for safety awareness during this transfer. Wheeled sinkside where he was able to wash hands with setupA. W/c transport for time management from his room to main therapy gym. Performed blocked practice squat<>pivot transfers from w/c to mat table, several repetitions, requiring mostly minA with occasional modA for clearing hips. Pt with improved ability to produce efficient transfers. Focused remainder of session on w/c propulsion where he propelled with BLE's only on level surfaces ~17ft with modA, demo's decreased stroke efficiency with legs and decreased coordination impacting cadence. He returned to his room where he remained seated in chair with safety belt alarm on, needs in reach.  Therapy Documentation Precautions:  Precautions Precautions: Fall Precaution Comments: left side ataxia and weakness Restrictions Weight Bearing Restrictions: No  Therapy/Group: Individual Therapy  Daianna Vasques P Tranquilino Fischler PT 11/10/2020, 11:39 AM

## 2020-11-10 NOTE — Discharge Summary (Addendum)
Physician Discharge Summary  Patient ID: Zachary Flores MRN: [id number] DOB/AGE: [birth year] 54 y.o.  Admit date: 09/15/2020 Discharge date: 11/11/2020  Discharge Diagnoses:  Principal Problem:   Pontine hemorrhage (HCC) Active Problems:   Right pontine cerebrovascular accident (HCC)   Pressure injury of skin   Urinary retention   History of hypertension   Drug-induced hypotension   Thrombocytosis   Hypotension   Labile blood pressure   Hemiparesis affecting left side as late effect of stroke (HCC)   Blood pressure increase diastolic   Vascular headache   Slow transit constipation   Urinary incontinence   Salmonella gastroenteritis   Bradycardia History of alcohol tobacco use  Discharged Condition: Stable  Significant Diagnostic Studies: DG Abd 1 View  Result Date: 10/29/2020 CLINICAL DATA:  Abdominal pain EXAM: ABDOMEN - 1 VIEW COMPARISON:  None. FINDINGS: The bowel gas pattern is normal. No radio-opaque calculi or other significant radiographic abnormality are seen. IMPRESSION: Negative. Electronically Signed   By: Jonna Clark M.D.   On: 10/29/2020 20:09   DG CHEST PORT 1 VIEW  Result Date: 10/29/2020 CLINICAL DATA:  Tachypnea. EXAM: PORTABLE CHEST 1 VIEW COMPARISON:  None. FINDINGS: Low lung volumes. Normal heart size for technique. Normal mediastinal contours. No focal airspace disease. No pulmonary edema. No pneumothorax or large pleural effusion. Degenerative change of both shoulders. No acute osseous abnormalities are seen. IMPRESSION: Low lung volumes without acute abnormality. Electronically Signed   By: Narda Rutherford M.D.   On: 10/29/2020 23:22    Labs:  Basic Metabolic Panel: No results for input(s): NA, K, CL, CO2, GLUCOSE, BUN, CREATININE, CALCIUM, MG, PHOS in the last 168 hours.  CBC: No results for input(s): WBC, NEUTROABS, HGB, HCT, MCV, PLT in the last 168 hours.  CBG: No results for input(s): GLUCAP in the last 168 hours.  Family history.  Unknown  Brief HPI:   Zachary Flores is a 54 y.o. right-handed male with history of hypertension as well as alcohol and tobacco use. Patient lives with his brother works in [employer]. Presented 09/06/2020 with left-sided weakness dysarthria and diplopia. Blood pressure noted to be 150/99. CT of the head as well as angiogram of the head and neck showed a 2.8 mL acute pontine hemorrhage. Severe chronic small vessel ischemic disease. Admission chemistries glucose 188 SARS coronavirus negative urine drug screen positive cocaine. Neurology follow-up conservative care monitoring of blood pressure. Latest echocardiogram with ejection fraction of 55 to 60% without emboli. Patient did have episodes of agitation and restlessness needing Precedex as well as CIWA protocol. Hospital course further complicated by dysphagia initially with nasogastric tube for nutritional support diet advanced to a dysphagia #2 thin liquid. Therapy evaluations completed and patient was admitted for a comprehensive rehab program.   Hospital Course: Zachary Flores was admitted to rehab 09/15/2020 for inpatient therapies to consist of PT, ST and OT at least three hours five days a week. Past admission physiatrist, therapy team and rehab RN have worked together to provide customized collaborative inpatient rehab. Pertaining to patient's central pontine hemorrhage in the setting of hypertension and cocaine use. Remained stable close monitoring of blood pressure. Patient had been on Lopressor 25 mg twice daily decreased to 12.5 mg twice daily and later discontinued on 09/302021 due to some hypotension. SCDs for DVT prophylaxis. His diet was slowly advanced to regular consistency. Patient with history of polysubstance use alcohol tobacco use urine drug screen positive cocaine. Patient did receive counts regards to cessation of alcohol tobacco and illicit drug  use. He did have occasional bouts of urinary incontinence doing well with routine  toileting. Transient constipation resolved. Patient with Salmonella gastroenteritis resolved C. difficile negative KUB unremarkable. No nausea vomiting.   Blood pressures were monitored on TID basis and soft and monitored     Rehab course: During patient's stay in rehab weekly team conferences were held to monitor patient's progress, set goals and discuss barriers to discharge. At admission, patient required max assist supine to sit max assist sit to supine moderate assist sit stand. Max assist grooming  Physical exam. Blood pressure 110/85 pulse 94 temperature 97.8 respirations 18 oxygen saturations 99% room air Constitutional. No acute distress HEENT Head. Normocephalic and atraumatic Neck. Supple nontender no JVD without thyromegaly Eyes. Pupils round and reactive to light no discharge without nystagmus Cardiac regular rate rhythm without any extra sounds or murmur heard Abdomen. Soft nontender positive bowel sounds without rebound Respiratory effort normal no respiratory distress without wheeze Musculoskeletal. No edema or tenderness extremities Neurologic. He was alert oriented x2 dysarthric but intelligible follow commands Limited awareness of his deficits. Motor. Right upper extremity 4+/5 proximal distal Right lower extremity 4/5 proximal distal Left upper extremity 4/5 proximal distal Left lower extremity 3+/5 proximal distal  He/  has had improvement in activity tolerance, balance, postural control as well as ability to compensate for deficits. He/ has had improvement in functional use RUE/LUE  and RLE/LLE as well as improvement in awareness. Perform stand pivot transfers with moderate assist and use of a grab bar. Patient sat on commode with supervision was continent of bladder. Required mod assist for standing balance and total assist for pulling up pants over hips. Patient was somewhat impulsive during transfers requiring cues for safety awareness. Patient with improved ability  to produce efficient transfers focused remainder of therapy sessions with wheelchair propulsion where he propelled with bilateral lower extremities on level surfaces with moderate assist. Completed sit to stand and standing balance with minimal assist to brush teeth. Patient transport to the bathroom completed squat pivot wheelchair drop arm bedside commode with minimal assist. Completed sit to stand in order to pull pants down over hips with minimal assist. Follow-up speech therapy for cognitive linguistic deficits exhibiting mild to moderate deficits primarily in problem solving awareness and attention. Arrangements were made for skilled nursing facility placement with discharge taking place 11/11/2020 to the [facility].       Disposition: Discharged to skilled nursing facility    Diet: Regular  Special Instructions: No driving smoking alcohol or illicit drug use  Monitor blood pressure closely.  Lisinopril and Lopressor recently discontinued due to blood pressure being soft    Medications at discharge 1. Tylenol as needed 2. Folic acid 1 mg p.o. daily 3. Imodium as needed diarrhea or loose stools 4. Claritin 10 mg daily 5. Multivitamin daily  30-35 minutes were spent completing discharge summary and discharge planning  Discharge Instructions     Ambulatory referral to Neurology   Complete by: As directed    An appointment is requested in approximately 4 weeks pontine hemorrhage   Ambulatory referral to Physical Medicine Rehab   Complete by: As directed    Moderate complexity follow-up 1 month pontine hemorrhage.  Patient for skilled nursing facility placement        Follow-up Information     Marcello Fennel, MD Follow up.   Specialty: Physical Medicine and Rehabilitation Why: Office to call for appointment Contact information: [address] [place] [address] [phone]  Signed: Mcarthur Rossetti Angiulli 11/10/2020, 6:22  PM Patient was seen, face-face, and physical exam performed by me on day of discharge, greater than 30 minutes of total time spent.. Please see progress note from day of discharge as well.  Maryla Morrow, MD, ABPMR

## 2020-11-10 NOTE — Progress Notes (Signed)
Speech Language Pathology Daily Session Note  Patient Details  Name: Zachary Flores MRN: [id number] Date of Birth: [birth year]  Today's Date: 11/10/2020 SLP Individual Time: 9407-6808 SLP Individual Time Calculation (min): 27 min  Short Term Goals: Week 7: SLP Short Term Goal 1 (Week 7): STG=LTG due to remaining length of stay; pending SNF placement  Skilled Therapeutic Interventions: Pt was seen for skilled ST targeting cognitive-linguistic goals. SLP facilitated session with administration of the [facility] version 7.1. Pt scored 20/30 (WNL= > or =26), indicative of mild-moderate deficits primarily in problem solving, awareness, and attention. Discussed results and reviewed problem solving strategies with pt. He also required Min A verbal cues for intentional use of slower rate of speech and overarticulation to achieve ~85% intelligibility throughout session. Pt left laying in bed with alarm set and needs within reach. Continue per current plan of care.          Pain Pain Assessment Pain Scale: 0-10 Pain Score: 0-No pain  Therapy/Group: Individual Therapy  Zachary Flores 11/10/2020, 9:01 AM

## 2020-11-10 NOTE — Patient Care Conference (Signed)
Inpatient RehabilitationTeam Conference and Plan of Care Update Date: 11/10/2020   Time: 11:14 AM    Patient Name: Zachary Flores      Medical Record Number: [id number]  Date of Birth: 1966-12-10 Sex: Male         Room/Bed: 4W20C/4W20C-01 Payor Info: Payor: MEDICAID POTENTIAL / Plan: MEDICAID POTENTIAL / Product Type: *No Product type* /    Admit Date/Time:  09/15/2020  4:24 PM  Primary Diagnosis:  Pontine hemorrhage [facility])  Hospital Problems: Principal Problem:   Pontine hemorrhage (HCC) Active Problems:   Right pontine cerebrovascular accident (HCC)   Pressure injury of skin   Urinary retention   History of hypertension   Drug-induced hypotension   Thrombocytosis   Hypotension   Labile blood pressure   Hemiparesis affecting left side as late effect of stroke (HCC)   Blood pressure increase diastolic   Vascular headache   Slow transit constipation   Urinary incontinence   Salmonella gastroenteritis   Bradycardia    Expected Discharge Date: Expected Discharge Date:  (SNF tentative/LOG)  Team Members Present: Physician leading conference: Dr. Maryla Morrow Care Coodinator Present: Chana Bode, RN, BSN, CRRN;Becky Dupree, LCSW Nurse Present: Margot Ables, LPN PT Present: Merry Lofty, PT OT Present: Dolphus Jenny, OT SLP Present: Colin Benton, SLP PPS Coordinator present : Edson Snowball, Park Breed, SLP     Current Status/Progress Goal Weekly Team Focus  Bowel/Bladder   Pt continenet/ incontinent at times.Last BM 11/08/2020  Pt to remain continent of B/B  assess q shift and PRN. toilet Q 2hours and PRN   Swallow/Nutrition/ Hydration   regular textures, thin liquids, intermittent supervision  Supervision  swallow goals previously exceeded   ADL's   sup/setup UB ADLs, min/modA LB dressing/toileting, minA bathing, min/modA functional transfers  CGA to minA  self care training, transfer training, dc planning   Mobility   supervision bed mobility, min/modA  squat<>pivot. w/c mobility minA  min/modA goals.  DC planning? Functional transfers, car transfers. Plateau in progress.   Communication   Supervision-Min depending on fatige and cognitive demands of task involved 85-90 % intelligible  Supervision  carryover speech intelligibility strategies, self monitoring and correcting verbal errors   Safety/Cognition/ Behavioral Observations  Supervision-Min A basic and familiar, cueing increases with complexity and novel tasks  Supervision-Min basic  error awareness, semi-complex problem solving, attention   Pain   No pain  Pain to remain a 0/10  Assess pain q shift and PRN   Skin   skin intact  no skin breakdown  Assess skin q shift and PRN     Discharge Planning:  [facility] trying to back out of the bed offer, will talk with Administer today to see if can push to take him. bessie-sister to talk with also   Team Discussion: No new issues to report. Patient is stable and ready for discharge Patient on target to meet rehab goals: yes  *See Care Plan and progress notes for long and short-term goals.   Revisions to Treatment Plan:   Teaching Needs:   Current Barriers to Discharge: Home enviroment access/layout, Lack of/limited family support and Insurance for SNF coverage  Possible Resolutions to Barriers: SNF bed offer on hold per facility; SW to review Letter of Guarantee with Interior and spatial designer of facility     Medical Summary Current Status: Left side hemiparesis, now with spasticity and slurred speech secondary to central pontine hemorrhage in the setting of hypertension and cocaine use  Barriers to Discharge: Medical stability;Decreased family/caregiver support;Other (comments)  Barriers to Discharge Comments: hx of substance abuse for SNF Possible Resolutions to Barriers/Weekly Focus: Therapies, continue to monitor BP, continue to advance diet as tolerated   Continued Need for Acute Rehabilitation Level of Care: The patient requires  daily medical management by a physician with specialized training in physical medicine and rehabilitation for the following reasons: Direction of a multidisciplinary physical rehabilitation program to maximize functional independence : Yes Medical management of patient stability for increased activity during participation in an intensive rehabilitation regime.: Yes Analysis of laboratory values and/or radiology reports with any subsequent need for medication adjustment and/or medical intervention. : Yes   I attest that I was present, lead the team conference, and concur with the assessment and plan of the team.   Chana Bode B 11/10/2020, 1:46 PM

## 2020-11-10 NOTE — Progress Notes (Addendum)
Patient ID: Zachary Flores, male   DOB: 1966/01/25, 54 y.o.   MRN: [id number]  Left message for [name]-Adm at [facility] regarding if she has spoken with pt's sister [name] and left message for [name] if she had gotten a hold of [name] also. Await response from both  1:35 PM Spoke with bessie-sister who did talk with jasmine, there concern is he would become long term care over 30 days. [name] assured her they would discharge him after 30 days. Calling jasmine to see if have made a decision regarding bed offer.  2:08 PM Spoke with [name] office Adm who will contact [name] of the facility and get back with this worker.  3:47 PM Spoke with [name] and [name] who reports needs to interview bessie again and get approval from the business office before can accept pt. Should hear shortly

## 2020-11-10 NOTE — Progress Notes (Signed)
Patient ID: Zachary Flores, male   DOB: 06/19/1966, 54 y.o.   MRN: [id number]  Still waiting for [facility] to get back with worker regarding bed offer. [name] still waiting to be called for the second time. [name] Adm to call her and check again will take home after 30 days.

## 2020-11-10 NOTE — Progress Notes (Signed)
[place] PHYSICAL MEDICINE & REHABILITATION PROGRESS NOTE   Subjective/Complaints: Patient seen sitting up in bed this morning working with therapies.  He states he slept well overnight.  He states he is worried now about his discharge plan.  ROS: Denies chest pain, shortness of breath, nausea, vomiting, diarrhea.  Objective:   No results found. No results for input(s): WBC, HGB, HCT, PLT in the last 72 hours. No results for input(s): NA, K, CL, CO2, GLUCOSE, BUN, CREATININE, CALCIUM in the last 72 hours.  Intake/Output Summary (Last 24 hours) at 11/10/2020 1044 Last data filed at 11/10/2020 0448 Gross per 24 hour  Intake 237 ml  Output 575 ml  Net -338 ml    Physical Exam: Vital Signs Blood pressure 116/73, pulse 60, temperature 98.1 F (36.7 C), temperature source Oral, resp. rate 18, height 5\' 7"  (1.702 m), weight 78.4 kg, SpO2 100 %.  Constitutional: No distress . Vital signs reviewed. HENT: Normocephalic.  Atraumatic. Eyes: EOMI. No discharge. Cardiovascular: No JVD.  RRR. Respiratory: Normal effort.  No stridor.  Bilateral clear to auscultation. GI: Non-distended.  BS +. Skin: Warm and dry.  Intact. Psych: Normal mood.  Normal behavior. Musc: No edema in extremities.  No tenderness in extremities. Neuro: Alert Dysarthric speech, stable Follows simple commands.   Limited awareness of deficits  Left neglect, improved Left sided apraxia and ataxia, stable Motor: RUE: 4+/5 proximal distal, stable RLE: 4-4+/5 proximal distal, stable LUE: 4+/5 proximal distal, unchanged LLE: Hip flexion, knee extension 4/5, ankle dorsiflexion 4+/5 Left upper extremity dysmetria and apraxia, unchanged MAS: 2/4 in left elbow flexors  Assessment/Plan: 1. 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  Physiatrist is providing close team supervision and 24 hour management of  active medical problems listed below.  Physiatrist and rehab team continue to assess barriers to discharge/monitor patient progress toward functional and medical goals    Medical Problem List and Plan: 1.  Left side hemiparesis, now with spasticity and slurred speech secondary to central pontine hemorrhage in the setting of hypertension and cocaine use  Continue CIR, changed to qdaily, hopeful plan for SNF tomorrow-attempting to contact facility as well as patient's sister.  Team conference today to discuss current and goals and coordination of care, home and environmental barriers, and discharge planning with nursing, case manager, and therapies. Please see conference note from today as well.  2.  Antithrombotics: -DVT/anticoagulation: SCDs             -antiplatelet therapy: N/A 3. Pain Management: Tylenol as needed for headaches.  Controlled on 11/17 4. Mood: Advised emotional support             -antipsychotic agents: Seroquel 25 mg nightly DC'd 5. Neuropsych: This patient is is?  Fully capable of making decisions on his own behalf. 6. Skin/Wound Care: foam dressing, continue pressure relief. 7. Fluids/Electrolytes/Nutrition: Routine in and outs.    BMP within normal limits on 11/2 8.  Post stroke dysphagia.      Advanced to regular thins, now with intermittent supervision  Overall good po intake  Continue to advance diet as tolerated 9.  History of hypertension.    Lopressor 25 mg twice daily, decreased to 12.5 twice daily on 9/28, DC'd on 9/30  Lisinopril DC'd Vitals:   11/09/20 2025 11/10/20 0447  BP: 108/70 116/73  Pulse: 67 60  Resp: 18 18  Temp: 98.3 F (36.8 C) 98.1 F (36.7 C)  SpO2: 95% 100%   Relatively controlled on 11/17 10.  Polysubstance abuse-alcohol, tobacco, cocaine abuse.  Monitor for any signs of withdrawal.    Supplement thiamine and folic acid  Counsel 11. Urinary retention/urinary incontinence:   Periods of incontinence, improved  PVRs improved,  d/ced  Will avoid medications at present given soft blood pressures   Continue to monitor 12.  Thrombocytosis:   Platelets 424 on 11/5 13.  Slow transit constipation  Bowel meds increased on 10/18, increased again on 10/23, increased again on 10/24,  D/ced  Stable  See #14  Improved 14.  Salmonella gastroenteritis- Resolved  Fever resolved  -ucx C Diff negative, CXR,KUB unremarkable, WBC's within normal limits  -monitor for any further signs or symptoms 15.  Bradycardia  Improved on 11/17  Continue to monitor  LOS: 56 days A FACE TO FACE EVALUATION WAS PERFORMED  Zachary Flores 11/10/2020, 10:43 AM

## 2020-11-10 NOTE — Progress Notes (Signed)
Speech Language Pathology Discharge Summary  Patient Details  Name: Zachary Flores MRN: [id number] Date of Birth: [birth year]   Patient has met 8 of 9 long term goals.  Patient to discharge at overall Min;Supervision;Mod level.  Reasons goals not met: increased cueing required for use of speech intelligibility strategies   Clinical Impression/Discharge Summary:  Pt made functional gains and met 8 out of 9 long term goals this admission. Pt is currently Min assist for basic familiar tasks, but Mod A is required for tasks of mild complexity due to impairments impacting his problem solving, selective attention, and emergent awareness. He also still requires fluctuating Supervision-Min A for use of compensatory strategies for speech intelligibility, but most frequently Min A due to dysarthria and difficulty self-monitoring and self-correcting verbal and functional errors. Pt is consistently oriented and able to use aids such as a calendar Mod I. He expresses his basic wants and needs comprehends basic directions/information with Supervision A verbal cues. Pt is consuming a regular texture diet with thin liquids and is Mod I-Supervision for use of compensatory swallow strategies to minimize aspiration risk and clearance of right buccal pocketing/residue. Pt education is ongoing; no family has been present for ST sessions and pt will be discharging to SNF today given that family cannot provide to recommended 24/7 supervision and care. Pt would continue to benefit from skilled ST at next venue of care (SNF).    Care Partner:  Caregiver Able to Provide Assistance: No     Recommendation:  24 hour supervision/assistance;Skilled Nursing facility  Rationale for SLP Follow Up: Maximize functional communication;Reduce caregiver burden;Maximize cognitive function and independence   Equipment: none   Reasons for discharge: Discharged from hospital   Patient/Family Agrees with Progress Made and Goals Achieved:  Yes    Arbutus Leas 11/11/2020, 1:24 PM

## 2020-11-10 NOTE — Progress Notes (Signed)
Test for Covid collected and took to the lab.

## 2020-11-10 NOTE — Progress Notes (Signed)
Occupational Therapy Session Note  Patient Details  Name: Zachary Flores MRN: [id number] Date of Birth: 01/21/1966  Today's Date: 11/10/2020 OT Individual Time: 1130-1200 OT Individual Time Calculation (min): 30 min    Short Term Goals: Week 7:  OT Short Term Goal 1 (Week 7): Pt will complete toileting with min assist OT Short Term Goal 2 (Week 7): Pt will complete toilet transfer with CGA OT Short Term Goal 3 (Week 7): Pt will complete LB dressing with CGA  Skilled Therapeutic Interventions/Progress Updates:    Pt sitting up in w/c, requesting to brush teeth, no c/o pain.  Pt completed sit<>stand and standing balance with min assist to brush teeth.  Pt then transported to bathroom and completed squat pivot w/c>drop arm BSC with min assist.  Pt completed sit<>stand in order to pull pants down over hips with min assist.  Pt had continent episode of bowel.  Pericare and clothing management completed with min assist for standing balance and sit<>stand transfer.  Squat pivot BSC to w/c with min assist.  Pt required step by step VCs for safe body mechanics and hand placement throughout session.  Pt sitting in w/c, call bell in reach, seat belt alarm on.  Therapy Documentation Precautions:  Precautions Precautions: Fall Precaution Comments: left side ataxia and weakness Restrictions Weight Bearing Restrictions: No   Therapy/Group: Individual Therapy  Amie Critchley 11/10/2020, 4:06 PM

## 2020-11-11 ENCOUNTER — Inpatient Hospital Stay (HOSPITAL_COMMUNITY): Payer: Self-pay | Admitting: Occupational Therapy

## 2020-11-11 ENCOUNTER — Inpatient Hospital Stay (HOSPITAL_COMMUNITY): Payer: Self-pay | Admitting: Speech Pathology

## 2020-11-11 ENCOUNTER — Inpatient Hospital Stay (HOSPITAL_COMMUNITY): Payer: Self-pay

## 2020-11-11 NOTE — Progress Notes (Signed)
Inpatient Rehabilitation Care Coordinator  Discharge Note  The overall goal for the admission was met for:   Discharge location: Yes-[facility]-SNF  Length of Stay: No-57 DAYS  Discharge activity level: Yes-MIN LEVEL  Home/community participation: Yes  Services provided included: MD, RD, PT, OT, SLP, RN, CM, TR, Pharmacy, Neuropsych and SW  Financial Services: Other: PENDING MEDICAID  Follow-up services arranged: Other: NHP  Comments (or additional information):Zachary Flores 30 DAY LOG AND WILL NEED TO DISCHARGE OUT OF FACILITY SHE IS AGREEABLE TO THIS. SSD AND MEDICAID PENDING.SHE IS FOLLOWING UP WITH BOTH  Patient/Family verbalized understanding of follow-up arrangements: Yes  Individual responsible for coordination of the follow-up plan: Zachary Flores [phone]  Confirmed correct DME delivered: Elease Hashimoto 11/11/2020    Elease Hashimoto

## 2020-11-11 NOTE — Progress Notes (Addendum)
Patient ID: Zachary Flores, male   DOB: Apr 11, 1966, 54 y.o.   MRN: [id number]  [facility] has accepted pt and bessie to complete paperwork at 10:00 with her and then hopefully daughter will be here at 12:00 to transport to facility, as long as they do not back out  10:42 AM Faxed paperwork to facility along with LOG letters. Sister to call once paperwork completed.  12:08 PM Spoke with [name] who reports he is good to go and can send him. Daughter to come back on 20 minutes and get on the road. Pt aware and [name]-Sister aware of the plan and will call niece to inform her. Pt will eat lunch first .

## 2020-11-11 NOTE — Progress Notes (Signed)
[facility] PHYSICAL MEDICINE & REHABILITATION PROGRESS NOTE   Subjective/Complaints: Patient seen laying in bed this AM.  He states he slept well overnight.  He is ready for discharge.  ROS: Denies chest pain, shortness of breath, nausea, vomiting, diarrhea.  Objective:   No results found. No results for input(s): WBC, HGB, HCT, PLT in the last 72 hours. No results for input(s): NA, K, CL, CO2, GLUCOSE, BUN, CREATININE, CALCIUM in the last 72 hours.  Intake/Output Summary (Last 24 hours) at 11/11/2020 1649 Last data filed at 11/11/2020 1237 Gross per 24 hour  Intake 777 ml  Output 650 ml  Net 127 ml    Physical Exam: Vital Signs Blood pressure (!) 147/96, pulse 66, temperature 97.8 F (36.6 C), resp. rate 18, height 5\' 7"  (1.702 m), weight 78.4 kg, SpO2 97 %.  Constitutional: No distress . Vital signs reviewed. HENT: Normocephalic.  Atraumatic. Eyes: EOMI. No discharge. Cardiovascular: No JVD.  RRR. Respiratory: Normal effort.  No stridor.  Bilateral clear to auscultation. GI: Non-distended.  BS +. Skin: Warm and dry.  Intact. Psych: Normal mood.  Normal behavior. Musc: No edema in extremities.  No tenderness in extremities. Neuro: Alert Dysarthric speech, stable Follows simple commands.   Left neglect, improved Left sided apraxia and ataxia, stable Motor: RUE: 4+/5 proximal distal, stable RLE: 4-4+/5 proximal distal, unchanged LUE: 4+/5 proximal distal, unchanged LLE: Hip flexion, knee extension 4/5, ankle dorsiflexion 4+/5 Left upper extremity dysmetria and apraxia, unchanged MAS: 2/4 in left elbow flexors  Assessment/Plan: 1. 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  Physiatrist is providing close team supervision and 24 hour management of active medical problems listed below.  Physiatrist and rehab team continue to assess barriers to  discharge/monitor patient progress toward functional and medical goals    Medical Problem List and Plan: 1.  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  Antithrombotics: -DVT/anticoagulation: SCDs             -antiplatelet therapy: N/A 3. Pain Management: Tylenol as needed for headaches.  Controlled on 11/18 4. Mood: Advised emotional support             -antipsychotic agents: Seroquel 25 mg nightly DC'd 5. Neuropsych: This patient is is?  Fully capable of making decisions on his own behalf. 6. Skin/Wound Care: foam dressing, continue pressure relief. 7. Fluids/Electrolytes/Nutrition: Routine in and outs.    BMP within normal limits on 11/2 8.  Post stroke dysphagia.      Advanced to regular thins, now with intermittent supervision  Overall good po intake  Continue to advance diet as tolerated 9.  History of hypertension.    Lopressor 25 mg twice daily, decreased to 12.5 twice daily on 9/28, DC'd on 9/30  Lisinopril DC'd Vitals:   11/10/20 2019 11/11/20 0414  BP: 125/78 (!) 147/96  Pulse: 70 66  Resp: 18 18  Temp: 98.3 F (36.8 C) 97.8 F (36.6 C)  SpO2: 97% 97%   Relatively controlled on 11/18 10.  Polysubstance abuse-alcohol, tobacco, cocaine abuse.  Monitor for any signs of withdrawal.    Supplement thiamine and folic acid  Counsel 11. Urinary retention/urinary incontinence:   Periods of incontinence, improved  PVRs improved, d/ced  Will avoid medications at present  given soft blood pressures   Continue to monitor 12.  Thrombocytosis:   Platelets 424 on 11/5 13.  Slow transit constipation  Bowel meds increased on 10/18, increased again on 10/23, increased again on 10/24,  D/ced  Stable  See #14  Improved 14.  Salmonella gastroenteritis- Resolved  Fever resolved  -ucx C Diff  negative, CXR,KUB unremarkable, WBC's within normal limits  -monitor for any further signs or symptoms 15.  Bradycardia  Controlled on 11/18  Continue to monitor  > 30 minutes spent in total in discharge planning between myself and PA regarding aforementioned, as well discussion follow-up appointments, discharge medications, discharge recommendations, discharge venue  LOS: 57 days A FACE TO FACE EVALUATION WAS PERFORMED  Zachary Flores 11/11/2020, 4:49 PM

## 2020-11-11 NOTE — Progress Notes (Signed)
Patient discharged off of unit with all belongings. Discharge papers/instructions explained by physician assistant to family. Patient and family have no further questions at time of discharge. No complications noted at this time.  Zachary Flores L Zachary Flores  

## 2020-11-11 NOTE — Discharge Summary (Signed)
Physical Therapy Discharge Summary  Patient Details  Name: Zachary Flores MRN: [id number] Date of Birth: [birth year]  Patient has met 7 of 9 long term goals due to improved activity tolerance, improved balance, improved postural control, increased strength, ability to compensate for deficits, functional use of  left upper extremity and left lower extremity, improved attention, improved awareness and improved coordination.  Patient to discharge at a wheelchair level [place]. Patient's care partner/family unable to provide the necessary physical and cognitive assistance at discharge; therefore, SNF recommended for follow-up care in order to continue functional mobility training.   Reasons goals not met: Pt unable to meet CGA for w/c mobility, requiring minA for propulsion, managing turns, and navigating environmental barriers. He also required minA for w/c management including locking brakes and leg/arm rest management.  Recommendation:  Patient will benefit from ongoing skilled PT services in skilled nursing facility setting to continue to advance safe functional mobility, address ongoing impairments in balance, safety, LUE/BLE ataxia, w/c and gait deficits in order to minimize fall risk.  Equipment: TBD by next level of care  Reasons for discharge: treatment goals met and discharge from hospital  Patient/family agrees with progress made and goals achieved: Yes  PT Discharge Precautions/Restrictions Precautions Precautions: Fall Precaution Comments: LUE and BLE ataxia (LLE>RLE) Restrictions Weight Bearing Restrictions: No Pain Pain Assessment Pain Scale: 0-10 Pain Score: 0-No pain Vision/Perception  Perception Perception: Impaired Inattention/Neglect: Does not attend to left side of body;Other (comment) (primarily during functional tasks) Spatial Orientation: Decreased ability to identify barriers or the position of space during functional transfers Praxis Praxis:  Impaired Praxis Impairment Details: Ideation;Ideomotor;Motor planning  Cognition Overall Cognitive Status: Impaired/Different from baseline Arousal/Alertness: Awake/alert Orientation Level: Oriented X4 Attention: Focused;Selective;Sustained Focused Attention: Impaired Focused Attention Impairment: Verbal basic;Functional basic Sustained Attention: Impaired Sustained Attention Impairment: Verbal basic;Functional basic Selective Attention: Impaired Selective Attention Impairment: Verbal basic;Functional basic Memory: Appears intact Decreased Short Term Memory: Verbal basic;Functional basic Awareness: Impaired Problem Solving: Impaired Sequencing: Impaired Behaviors: Impulsive Safety/Judgment: Impaired Sensation Sensation Light Touch: Impaired by gross assessment Hot/Cold: Appears Intact Proprioception: Impaired by gross assessment Stereognosis: Impaired by gross assessment Coordination Gross Motor Movements are Fluid and Coordinated: No Fine Motor Movements are Fluid and Coordinated: No Coordination and Movement Description: Significant LUE apraxia and BLE ataxia (LLE>RLE) Motor  Motor Motor: Hemiplegia;Motor apraxia;Abnormal postural alignment and control;Abnormal tone  Mobility Bed Mobility Bed Mobility: Rolling Right;Rolling Left;Supine to Sit;Sit to Supine Rolling Right: Minimal Assistance - Patient > 75% Rolling Left: Minimal Assistance - Patient > 75% Supine to Sit: Supervision/Verbal cueing Sit to Supine: Supervision/Verbal cueing Transfers Transfers: Sit to Stand;Stand to Sit;Stand Pivot Transfers;Squat Pivot Transfers Sit to Stand: Minimal Assistance - Patient > 75% Stand to Sit: Minimal Assistance - Patient > 75% Stand Pivot Transfers: Moderate Assistance - Patient 50 - 74% Stand Pivot Transfer Details: Manual facilitation for weight shifting;Verbal cues for technique;Verbal cues for precautions/safety;Verbal cues for sequencing;Visual cues/gestures for  sequencing;Tactile cues for initiation;Tactile cues for sequencing;Tactile cues for weight shifting;Visual cues/gestures for precautions/safety Squat Pivot Transfers: Minimal Assistance - Patient > 75% Transfer (Assistive device): None Locomotion  Gait Ambulation: Yes Gait Assistance: Moderate Assistance - Patient 50-74% Gait Distance (Feet): 100 Feet Assistive device: 2 person hand held assist Gait Assistance Details: Manual facilitation for placement;Verbal cues for precautions/safety;Manual facilitation for weight shifting;Verbal cues for technique;Verbal cues for sequencing;Visual cues/gestures for sequencing;Verbal cues for gait pattern;Manual facilitation for weight bearing;Tactile cues for placement;Tactile cues for weight beaing;Tactile cues for posture;Tactile cues for weight shifting;Tactile cues for  sequencing;Tactile cues for initiation Gait Gait: Yes Gait Pattern: Impaired Gait Pattern: Step-to pattern;Decreased step length - right;Decreased step length - left;Poor foot clearance - right;Poor foot clearance - left;Wide base of support;Ataxic Gait velocity: decreased Stairs / Additional Locomotion Stairs: No Architect: Yes Wheelchair Assistance: Minimal assistance - Patient >75% Wheelchair Propulsion: Both lower extermities Wheelchair Parts Management: Needs assistance Distance: 158f  Trunk/Postural Assessment  Cervical Assessment Cervical Assessment: Exceptions to [facility](rigid, decreased lateral flexion and rotation. Forward head) Thoracic Assessment Thoracic Assessment: Exceptions to [facility](Rigid posture) Lumbar Assessment Lumbar Assessment: Exceptions to [facility](post pelvic tilt) Postural Control Postural Control: Deficits on evaluation Righting Reactions: delayed; posterior lean Protective Responses: delayed  Balance Balance Balance Assessed: Yes Static Sitting Balance Static Sitting - Balance Support: Feet supported Static Sitting -  Level of Assistance: 5: Stand by assistance Dynamic Sitting Balance Dynamic Sitting - Balance Support: During functional activity Dynamic Sitting - Level of Assistance: 4: Min aInsurance risk surveyorStanding - Balance Support: No upper extremity supported Static Standing - Level of Assistance: 4: Min assist Extremity Assessment  RUE Assessment RUE Assessment: Exceptions to [facility] Center[facility] Strength Comments: Grossly 4-/5. LUE Assessment LUE Assessment: Exceptions to WGr[facility] Ec LLCGeneral Strength Comments: Grossly 3+ to 4-/5. Difficult to assess distally due to significant apraxia RLE Assessment RLE Assessment: Exceptions to [facility] Strength Comments: Grossly 4-/5 LLE Assessment LLE Assessment: Exceptions to WAg[facility]ave[facility] LLCGeneral Strength Comments: Grossly 4-/5  Trever Streater P Nihar Klus PT ,DPT 11/11/2020, 12:55 PM

## 2020-11-11 NOTE — Plan of Care (Signed)
  Problem: Consults Goal: RH GENERAL PATIENT EDUCATION Description: Patient will gain knowledge of disease management, pain management bowel, and bladder management during this admission. Outcome: Completed/Met Goal: Skin Care Protocol Initiated - if Braden Score 18 or less Description: If consults are not indicated, leave blank or document N/A Outcome: Completed/Met Goal: Nutrition Consult-if indicated Outcome: Completed/Met   Problem: RH BOWEL ELIMINATION Goal: RH STG MANAGE BOWEL WITH ASSISTANCE Description: STG Manage Bowel with mod I Assistance. Outcome: Completed/Met   Problem: RH BLADDER ELIMINATION Goal: RH STG MANAGE BLADDER WITH ASSISTANCE Description: STG Manage Bladder With Assistance Patient will manage bladder with mod assistance. Outcome: Completed/Met Goal: RH STG MANAGE BLADDER WITH EQUIPMENT WITH ASSISTANCE Description: STG Manage Bladder With Equipment With mod I Assistance Outcome: Completed/Met   Problem: RH SKIN INTEGRITY Goal: RH STG SKIN FREE OF INFECTION/BREAKDOWN Description: Patients skin will be free of infection and break down during this admission. Outcome: Completed/Met   Problem: RH SAFETY Goal: RH STG ADHERE TO SAFETY PRECAUTIONS W/ASSISTANCE/DEVICE Description: STG Adhere to Safety Precautions With Assistance/Device. Patient will adhere to safety plan and will not have any fall this admission. Outcome: Completed/Met   Problem: RH PAIN MANAGEMENT Goal: RH STG PAIN MANAGED AT OR BELOW PT'S PAIN GOAL Description: Patients pain will be managed at or below the level of 3 on a 0-10 pain scale during this admission. Outcome: Completed/Met   Problem: RH KNOWLEDGE DEFICIT GENERAL Goal: RH STG INCREASE KNOWLEDGE OF SELF CARE AFTER HOSPITALIZATION Description: Patient will gain knowledge of self care during this admission. Outcome: Completed/Met

## 2020-11-11 NOTE — Progress Notes (Signed)
Speech Language Pathology Daily Session Note  Patient Details  Name: Zachary Flores MRN: [id number] Date of Birth: April 01, 1966  Today's Date: 11/11/2020 SLP Individual Time: 5051-8335 SLP Individual Time Calculation (min): 26 min  Short Term Goals: Week 7: SLP Short Term Goal 1 (Week 7): STG=LTG due to remaining length of stay; pending SNF placement  Skilled Therapeutic Interventions: Pt was seen for skilled ST targeting speech intelligibility. Pt was eager to discuss details regarding his discharge plan, therefore sentence and conversation level tasks used to target increased vocal intensity and overarticulation strategies. He required overall Min A to implement these strategies to increase intelligibility to ~85% during session. Cueing faded to Supervision when pt spoke to his sister on the phone and he was 100% intelligible to sister. Pt left laying in bed with alarm set and needs within reach. Continue per current plan of care.          Pain Pain Assessment Pain Scale: 0-10 Pain Score: 0-No pain  Therapy/Group: Individual Therapy  Zachary Flores 11/11/2020, 12:12 PM

## 2020-11-11 NOTE — Progress Notes (Signed)
Coved test result - Negative.

## 2020-11-11 NOTE — Progress Notes (Signed)
Occupational Therapy Discharge Summary  Patient Details  Name: Zachary Flores MRN: [id number] Date of Birth: Nov 23, 1966  Today's Date: 11/11/2020    Patient has met 9 of 11 long term goals due to improved activity tolerance, improved balance, postural control, ability to compensate for deficits, functional use of  RIGHT upper, RIGHT lower, LEFT upper and LEFT lower extremity, improved attention, improved awareness and improved coordination.  Patient to discharge at [facility] Assist level.  Patient will receive the needed assist from caregivers in skilled nursing facility setting.    Reasons goals not met: Persistent significant ataxia in LUE and BLE as well as cognitive impairment inhibiting carryover of skilled training.  Recommendation:  Patient will benefit from ongoing skilled OT services in skilled nursing facility setting to continue to advance functional skills in the area of BADL.  Equipment: No equipment provided  Reasons for discharge: treatment goals met and discharge from hospital  Patient/family agrees with progress made and goals achieved: Yes  OT Discharge Precautions/Restrictions  Precautions Precautions: Fall Precaution Comments: LUE and BLE ataxia (LLE>RLE) Restrictions Weight Bearing Restrictions: No General PT Missed Treatment Reason: Other (Comment) (patient discharged from hospital) Pain Pain Assessment Pain Scale: 0-10 Pain Score: 0-No pain ADL ADL Eating: Supervision/safety Where Assessed-Eating: Wheelchair Grooming: Setup Where Assessed-Grooming: Sitting at sink Upper Body Bathing: Supervision/safety Where Assessed-Upper Body Bathing: Sitting at sink Lower Body Bathing: Minimal assistance Where Assessed-Lower Body Bathing: Sitting at sink, Shower Upper Body Dressing: Setup Where Assessed-Upper Body Dressing: Sitting at sink Lower Body Dressing: Minimal assistance Where Assessed-Lower Body Dressing: Wheelchair Toileting: Moderate  assistance, Minimal assistance Where Assessed-Toileting: Other (Comment) (3 in1 commode over toilet) Toilet Transfer: Minimal assistance, Maximal verbal cueing Toilet Transfer Method: Squat pivot Toilet Transfer Equipment: Extra wide Geophysical data processor: Minimal assistance, Maximal cueing Social research officer, government Method: Education officer, environmental: Gaffer Baseline Vision/History: No visual deficits Patient Visual Report: No change from baseline Vision Assessment?: No apparent visual deficits Perception  Perception: Impaired Inattention/Neglect: Does not attend to left side of body Spatial Orientation: Decreased ability to identify barriers or the position of space during functional transfers Praxis Praxis: Impaired Praxis Impairment Details: Ideation;Ideomotor;Motor planning Cognition Overall Cognitive Status: Impaired/Different from baseline Arousal/Alertness: Awake/alert Orientation Level: Oriented X4 Attention: Focused;Selective;Sustained;Divided;Alternating Focused Attention: Impaired Focused Attention Impairment: Functional basic;Verbal basic Sustained Attention: Impaired Sustained Attention Impairment: Functional basic;Verbal basic Selective Attention: Impaired Selective Attention Impairment: Functional basic;Verbal basic Alternating Attention: Impaired Alternating Attention Impairment: Functional basic;Verbal basic Divided Attention: Impaired Divided Attention Impairment: Functional basic;Verbal basic Memory: Impaired Memory Impairment: Storage deficit;Retrieval deficit Decreased Short Term Memory: Verbal basic;Functional basic Awareness: Impaired Awareness Impairment: Emergent impairment Problem Solving: Impaired Problem Solving Impairment: Verbal basic;Functional basic Executive Function: Sequencing;Initiating;Self Monitoring;Self Correcting Sequencing: Impaired Sequencing Impairment: Verbal basic;Functional  basic Decision Making: Impaired Decision Making Impairment: Verbal basic;Functional basic Initiating: Impaired Initiating Impairment: Verbal basic;Functional basic Self Monitoring: Impaired Self Monitoring Impairment: Verbal basic;Functional basic Self Correcting: Impaired Self Correcting Impairment: Verbal basic;Functional basic Behaviors: Impulsive Safety/Judgment: Impaired Sensation Sensation Light Touch: Impaired by gross assessment Hot/Cold: Appears Intact Proprioception: Impaired by gross assessment Stereognosis: Not tested Coordination Gross Motor Movements are Fluid and Coordinated: No Fine Motor Movements are Fluid and Coordinated: No Coordination and Movement Description: Significant LUE apraxia and BLE ataxia (LLE>RLE) Motor  Motor Motor: Hemiplegia;Motor apraxia;Abnormal postural alignment and control;Abnormal tone Motor - Skilled Clinical Observations: LUE flexor synergy, L inattention, strong posterior bias in standing > seated, L HB weakness Mobility  Bed Mobility Bed Mobility:  Rolling Right;Rolling Left;Supine to Sit;Sit to Supine Rolling Right: Supervision/verbal cueing Rolling Left: Supervision/Verbal cueing Supine to Sit: Supervision/Verbal cueing Sit to Supine: Supervision/Verbal cueing Transfers Sit to Stand: Minimal Assistance - Patient > 75% Stand to Sit: Minimal Assistance - Patient > 75%  Trunk/Postural Assessment  Cervical Assessment Cervical Assessment: Exceptions to [facility] (rigid, decreased rotation and lateral flexion) Thoracic Assessment Thoracic Assessment: Exceptions to [facility] (rigid posture) Lumbar Assessment Lumbar Assessment: Exceptions to [facility] (posterior pelvic tilt) Postural Control Postural Control: Deficits on evaluation Righting Reactions: delayed; posterior lean Protective Responses: delayed  Balance Balance Balance Assessed: Yes Static Sitting Balance Static Sitting - Balance Support: Feet supported Static Sitting - Level of  Assistance: 5: Stand by assistance Dynamic Sitting Balance Dynamic Sitting - Balance Support: During functional activity Dynamic Sitting - Level of Assistance: 5: Stand by assistance Static Standing Balance Static Standing - Balance Support: No upper extremity supported Static Standing - Level of Assistance: 4: Min assist Dynamic Standing Balance Dynamic Standing - Level of Assistance: 3: Mod assist Extremity/Trunk Assessment RUE Assessment RUE Assessment: Exceptions to [facility] General Strength Comments: Grossly 4-/5. LUE Assessment LUE Assessment: Exceptions to [facility] General Strength Comments: Grossly 3+ to 4-/5. Difficult to assess distally due to significant apraxia   Zachary Flores 11/11/2020, 3:56 PM

## 2020-12-20 ENCOUNTER — Inpatient Hospital Stay: Payer: Self-pay | Admitting: Physical Medicine & Rehabilitation

## 2021-01-07 DIAGNOSIS — F17200 Nicotine dependence, unspecified, uncomplicated: Secondary | ICD-10-CM | POA: Insufficient documentation

## 2021-01-07 DIAGNOSIS — Z8673 Personal history of transient ischemic attack (TIA), and cerebral infarction without residual deficits: Secondary | ICD-10-CM | POA: Insufficient documentation

## 2021-01-07 DIAGNOSIS — R252 Cramp and spasm: Secondary | ICD-10-CM | POA: Diagnosis not present

## 2021-01-07 DIAGNOSIS — E860 Dehydration: Secondary | ICD-10-CM | POA: Diagnosis not present

## 2021-01-07 DIAGNOSIS — I10 Essential (primary) hypertension: Secondary | ICD-10-CM | POA: Insufficient documentation

## 2021-01-07 DIAGNOSIS — R109 Unspecified abdominal pain: Secondary | ICD-10-CM | POA: Diagnosis present

## 2021-01-07 DIAGNOSIS — K5900 Constipation, unspecified: Secondary | ICD-10-CM | POA: Insufficient documentation

## 2021-01-07 NOTE — ED Triage Notes (Addendum)
Pt arrives via [facility] from home with c/o abdominal cramping, constipation for 3 days, no BM for 3 days. Non ambulatory. 144/100, hr 90, rr 18, 99 sats, CBG 133, 97.2 temp. Pain concentrated on left upper and lower abdominal.

## 2021-01-08 ENCOUNTER — Emergency Department (HOSPITAL_COMMUNITY): Payer: Medicaid Other

## 2021-01-08 ENCOUNTER — Other Ambulatory Visit: Payer: Self-pay

## 2021-01-08 ENCOUNTER — Emergency Department (HOSPITAL_COMMUNITY)
Admission: EM | Admit: 2021-01-08 | Discharge: 2021-01-08 | Disposition: A | Discharge status: Home / Self Care | Payer: Medicaid Other | Attending: Emergency Medicine | Admitting: Emergency Medicine

## 2021-01-08 ENCOUNTER — Encounter (HOSPITAL_COMMUNITY): Payer: Self-pay | Admitting: *Deleted

## 2021-01-08 DIAGNOSIS — K5900 Constipation, unspecified: Secondary | ICD-10-CM

## 2021-01-08 DIAGNOSIS — E860 Dehydration: Secondary | ICD-10-CM

## 2021-01-08 DIAGNOSIS — R109 Unspecified abdominal pain: Secondary | ICD-10-CM

## 2021-01-08 HISTORY — DX: Cerebral infarction, unspecified: I63.9

## 2021-01-08 LAB — CK: Total CK: 403 U/L — ABNORMAL HIGH (ref 49–397)

## 2021-01-08 LAB — URINALYSIS, ROUTINE W REFLEX MICROSCOPIC
Bilirubin Urine: NEGATIVE
Glucose, UA: NEGATIVE mg/dL
Hgb urine dipstick: NEGATIVE
Ketones, ur: 5 mg/dL — AB
Leukocytes,Ua: NEGATIVE
Nitrite: NEGATIVE
Protein, ur: 30 mg/dL — AB
Specific Gravity, Urine: 1.019 (ref 1.005–1.030)
pH: 5 (ref 5.0–8.0)

## 2021-01-08 LAB — COMPREHENSIVE METABOLIC PANEL
ALT: 29 U/L (ref 0–44)
AST: 36 U/L (ref 15–41)
Albumin: 4.3 g/dL (ref 3.5–5.0)
Alkaline Phosphatase: 75 U/L (ref 38–126)
Anion gap: 12 (ref 5–15)
BUN: 13 mg/dL (ref 6–20)
CO2: 25 mmol/L (ref 22–32)
Calcium: 10 mg/dL (ref 8.9–10.3)
Chloride: 103 mmol/L (ref 98–111)
Creatinine, Ser: 1.14 mg/dL (ref 0.61–1.24)
GFR, Estimated: >60 mL/min (ref >60)
Glucose, Bld: 131 mg/dL — ABNORMAL HIGH (ref 70–99)
Potassium: 3.4 mmol/L — ABNORMAL LOW (ref 3.5–5.1)
Sodium: 140 mmol/L (ref 135–145)
Total Bilirubin: 0.5 mg/dL (ref 0.3–1.2)
Total Protein: 7.7 g/dL (ref 6.5–8.1)

## 2021-01-08 LAB — CBC
HCT: 47.6 % (ref 39.0–52.0)
Hemoglobin: 15.1 g/dL (ref 13.0–17.0)
MCH: 27.9 pg (ref 26.0–34.0)
MCHC: 31.7 g/dL (ref 30.0–36.0)
MCV: 87.8 fL (ref 80.0–100.0)
Platelets: 383 10*3/uL (ref 150–400)
RBC: 5.42 MIL/uL (ref 4.22–5.81)
RDW: 13.8 % (ref 11.5–15.5)
WBC: 11.1 10*3/uL — ABNORMAL HIGH (ref 4.0–10.5)
nRBC: 0 % (ref 0.0–0.2)

## 2021-01-08 LAB — LIPASE, BLOOD: Lipase: 24 U/L (ref 11–51)

## 2021-01-08 MED ORDER — IOHEXOL 300 MG/ML  SOLN
100.0000 mL | Freq: Once | INTRAMUSCULAR | Status: AC | PRN
  Administered 2021-01-08: 100 mL via INTRAVENOUS

## 2021-01-08 MED ORDER — DICYCLOMINE HCL 10 MG PO CAPS
10.0000 mg | ORAL_CAPSULE | Freq: Once | ORAL | Status: AC
  Administered 2021-01-08: 10 mg via ORAL
  Filled 2021-01-08: qty 1

## 2021-01-08 MED ORDER — SODIUM CHLORIDE 0.9 % IV BOLUS
500.0000 mL | Freq: Once | INTRAVENOUS | Status: AC
  Administered 2021-01-08: 500 mL via INTRAVENOUS

## 2021-01-08 MED ORDER — MAGNESIUM CITRATE PO SOLN: 1.0000 | Freq: Once | ORAL | 0 refills | Status: AC

## 2021-01-08 NOTE — ED Triage Notes (Signed)
Pt says that he has been having some abdominal pain and constipation. Reports hx of stroke with left sided weakness.

## 2021-01-08 NOTE — ED Notes (Signed)
Pt daughter [name] called, want to be notified when he gets a room and will be the transportation for pt. Please call [phone]

## 2021-01-08 NOTE — ED Provider Notes (Signed)
[facility] EMERGENCY DEPARTMENT Provider Note   CSN: [id number] Arrival date & time: 01/07/21  2337     History Chief Complaint  Patient presents with  . Abdominal Pain    Zachary Flores is a 55 y.o. male presenting for evaluation of abdominal pain and constipation.  Patient states for the past 4 days he has been having intermittent spasming pain of his abdomen.  He has not had a bowel movement for 4 days, this is abnormal for him.  He states he is still passing gas.  He states pain is severe, nothing makes it better or worse.  He has not taken anything for it.  He also reports bilateral leg cramping, worse when he is doing activity.  He is currently in rehab due to recent history of a stroke causing left-sided weakness.  He denies fevers, chills, chest pain, breath, cough, nausea, vomiting, urinary symptoms.  He denies recent medication changes.  Additional history obtained per chart review.  Patient had a hemorrhagic stroke 4 months ago. He lives with a roommate.   HPI     Past Medical History:  Diagnosis Date  . Stroke [facility])     Patient Active Problem List   Diagnosis Date Noted  . Bradycardia   . Salmonella gastroenteritis   . Urinary incontinence   . Slow transit constipation   . Vascular headache   . Blood pressure increase diastolic   . Labile blood pressure   . Hemiparesis affecting left side as late effect of stroke (HCC)   . Thrombocytosis   . Hypotension   . Urinary retention   . History of hypertension   . Drug-induced hypotension   . Pressure injury of skin 09/19/2020  . 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 Left-sided weakness   . Polysubstance abuse (HCC)   . Essential hypertension   . Dysphagia, post-stroke   . Cocaine abuse (HCC) 09/10/2020  . Dysarthria 09/10/2020  . Acute metabolic encephalopathy 09/10/2020  . Alcohol withdrawal syndrome with complication, with  unspecified complication (HCC)   . Hemorrhagic stroke (HCC) 09/06/2020  . TIA (transient ischemic attack) 06/30/2019  . Alcohol abuse with intoxication (HCC) 06/30/2019  . Elevated LFTs 06/30/2019    No past surgical history on file.     Family History  Family history unknown: Yes    Social History   Tobacco Use  . Smoking status: Current Every Day Smoker  . Smokeless tobacco: Never Used  Substance Use Topics  . Alcohol use: Yes    Alcohol/week: 24.0 standard drinks    Types: 24 Cans of beer per week    Home Medications Prior to Admission medications   Medication Sig Start Date End Date Taking? Authorizing Provider  magnesium citrate SOLN Take 296 mLs (1 Bottle total) by mouth once for 1 dose. 01/08/21 01/08/21 Yes Orene Abbasi, PA-C  acetaminophen (TYLENOL) 325 MG tablet Take 2 tablets (650 mg total) by mouth every 4 (four) hours as needed for mild pain (temp >99.5 F). 11/10/20   Angiulli, Mcarthur Rossetti, PA-C  folic acid (FOLVITE) 1 MG tablet Take 1 tablet (1 mg total) by mouth daily. 11/11/20   Angiulli, Mcarthur Rossetti, PA-C  loperamide (IMODIUM) 2 MG capsule Take 1 capsule (2 mg total) by mouth every 4 (four) hours as needed for diarrhea or loose stools. 11/10/20   Angiulli, Mcarthur Rossetti, PA-C  loratadine (CLARITIN) 10 MG tablet Take 1 tablet (10 mg total) by mouth daily. 11/11/20  Angiulli, Mcarthur Rossetti, PA-C  Multiple Vitamin (MULTIVITAMIN WITH MINERALS) TABS tablet Take 1 tablet by mouth daily. 11/11/20   Angiulli, Mcarthur Rossetti, PA-C    Allergies    Patient has no known allergies.  Review of Systems   Review of Systems  Gastrointestinal: Positive for abdominal pain and constipation.  Musculoskeletal: Positive for myalgias (intermittent leg cramping).  All other systems reviewed and are negative.   Physical Exam Updated Vital Signs BP (!) 136/97   Pulse 92   Temp 98.2 F (36.8 C) (Oral)   Resp 17   SpO2 96%   Physical Exam Vitals and nursing note reviewed.  Constitutional:       General: He is not in acute distress.    Appearance: He is well-developed and well-nourished.     Comments: Resting in the bed in NAD  HENT:     Head: Normocephalic and atraumatic.     Mouth/Throat:     Mouth: Mucous membranes are dry.  Eyes:     Extraocular Movements: EOM normal.     Conjunctiva/sclera: Conjunctivae normal.     Pupils: Pupils are equal, round, and reactive to light.  Cardiovascular:     Rate and Rhythm: Normal rate and regular rhythm.     Pulses: Normal pulses and intact distal pulses.  Pulmonary:     Effort: Pulmonary effort is normal. No respiratory distress.     Breath sounds: Normal breath sounds. No wheezing.  Abdominal:     General: There is no distension.     Palpations: Abdomen is soft. There is no mass.     Tenderness: There is abdominal tenderness. There is no guarding or rebound.     Comments: Minimal bowel sounds.  Diffuse TTP of the abdomen, worse in the lower abdomen and on the left side.  No rigidity, guarding, distention.  Negative rebound.  No peritonitis.   Musculoskeletal:     Cervical back: Normal range of motion and neck supple.     Comments: Weakness of L side, baseline. Pedal pulses 2+ bilaterally.   Skin:    General: Skin is warm and dry.     Capillary Refill: Capillary refill takes less than 2 seconds.  Neurological:     Mental Status: He is alert.     Comments: Speech difficulties, baseline per pt  Psychiatric:        Mood and Affect: Mood and affect normal.     ED Results / Procedures / Treatments   Labs (all labs ordered are listed, but only abnormal results are displayed) Labs Reviewed  COMPREHENSIVE METABOLIC PANEL - Abnormal; Notable for the following components:      Result Value   Potassium 3.4 (*)    Glucose, Bld 131 (*)    All other components within normal limits  CBC - Abnormal; Notable for the following components:   WBC 11.1 (*)    All other components within normal limits  URINALYSIS, ROUTINE W REFLEX  MICROSCOPIC - Abnormal; Notable for the following components:   APPearance CLOUDY (*)    Ketones, ur 5 (*)    Protein, ur 30 (*)    Bacteria, UA RARE (*)    All other components within normal limits  CK - Abnormal; Notable for the following components:   Total CK 403 (*)    All other components within normal limits  LIPASE, BLOOD    EKG None  Radiology CT ABDOMEN PELVIS W CONTRAST  Result Date: 01/08/2021 CLINICAL DATA:  Possible diverticulitis or bowel  obstruction. Abdominal pain and constipation 3 days. EXAM: CT ABDOMEN AND PELVIS WITH CONTRAST TECHNIQUE: Multidetector CT imaging of the abdomen and pelvis was performed using the standard protocol following bolus administration of intravenous contrast. CONTRAST:  OMNIPAQUE IOHEXOL 300 MG/ML  SOLN COMPARISON:  None. FINDINGS: Lower chest: Lung bases are within normal. Hepatobiliary: Liver, gallbladder and biliary tree are normal. Pancreas: Normal. Spleen: Normal. Adrenals/Urinary Tract: Adrenal glands are normal. Kidneys normal in size without nephrolithiasis. Subtle symmetric prominence of the intrarenal collecting systems. Subcentimeter upper pole left renal cortical hypodensity too small to characterize but likely a cyst. Ureters and bladder are normal. Stomach/Bowel: Stomach and small bowel are normal. Appendix is normal. Colon is normal. Vascular/Lymphatic: There is minimal calcified plaque over the abdominal aorta which is normal caliber. No adenopathy. Reproductive: Normal. Other: No free fluid or focal inflammatory change. Musculoskeletal: Minimal degenerate change of the spine with mild disc space narrowing at the L4-5 level. Mild degenerate change of the hips right worse than left. IMPRESSION: 1. No acute findings in the abdomen/pelvis. 2. Subcentimeter upper pole left renal cortical hypodensity too small to characterize, but likely a cyst. 3. Aortic atherosclerosis. Aortic Atherosclerosis (ICD10-I70.0). Electronically Signed   By:  Elberta Fortis M.D.   On: 01/08/2021 11:43    Procedures Procedures (including critical care time)  Medications Ordered in ED Medications  dicyclomine (BENTYL) capsule 10 mg (10 mg Oral Given 01/08/21 1035)  sodium chloride 0.9 % bolus 500 mL (0 mLs Intravenous Stopped 01/08/21 1206)  iohexol (OMNIPAQUE) 300 MG/ML solution 100 mL (100 mLs Intravenous Contrast Given 01/08/21 1125)    ED Course  I have reviewed the triage vital signs and the nursing notes.  Pertinent labs & imaging results that were available during my care of the patient were reviewed by me and considered in my medical decision making (see chart for details).    MDM Rules/Calculators/A&P                          Patient presenting for evaluation of abdominal pain and constipation.  On exam, patient peers nontoxic.  He does have diffuse tenderness palpation the abdomen with minimal bowel sounds.  Concerns for possible bowel obstruction.  As patient is also having pain worse on the left side, describes it as a spasm pain, consider infection such as diverticulitis.  Consider viral GI illness.  Patient also with bilateral muscle leg spasming/cramping, consider dehydration.  Will check CK.  Labs obtained from triage interpreted by me, overall reassuring.  No leukocytosis.  Electrolytes stable.  UA pending.  CT abdomen pelvis pending.  CT negative for acute findings. Ck minimally elevated at 403, fluids given. On recheck, pt states sxs are improved. He has dry MM, and in the setting of muscle cramps and constipation, favor dehydration. Will tx constipation, pt requesting mag citrate. Discussed hydration. Pt to f/u with pcp. At this time, pt appears safe for d/c. Return precautions given. Pt states he understands and agrees to plan.   Final Clinical Impression(s) / ED Diagnoses Final diagnoses:  Dehydration  Abdominal spasms  Constipation, unspecified constipation type    Rx / DC Orders ED Discharge Orders         Ordered     magnesium citrate SOLN   Once        01/08/21 1316           Axtyn Woehler, PA-C 01/08/21 1323    Gwyneth Sprout, MD 01/08/21 1417

## 2021-01-08 NOTE — ED Notes (Signed)
Patient resting in bed, taken to CT  VSS  NAD

## 2021-01-08 NOTE — ED Notes (Signed)
Patient transported to CT 

## 2021-01-08 NOTE — Discharge Instructions (Addendum)
Continue taking home medications as prescribed.  Make sure you are staying well hydrated with water. Your urine should be clear to pale yellow.  Drink the bottle of mag citrate to encourage a bowel movement.  Follow up with your primary care doctor next week for recheck of symptoms.  Return to the ER if you develop fevers, persistent vomiting, severe worsening pain, or any new, worsening, or concerning symptoms.

## 2021-01-08 NOTE — ED Notes (Signed)
Will update pt vitals when pt returns from CT.

## 2021-01-08 NOTE — ED Triage Notes (Signed)
Family called for transport of patient  IV removed and discharge paperwork went over with family

## 2021-02-11 ENCOUNTER — Ambulatory Visit: Payer: MEDICAID | Admitting: Diagnostic Neuroimaging

## 2021-02-15 ENCOUNTER — Ambulatory Visit: Payer: Medicaid Other | Admitting: Neurology

## 2021-02-15 ENCOUNTER — Encounter: Payer: Self-pay | Admitting: Neurology

## 2021-02-15 ENCOUNTER — Other Ambulatory Visit: Payer: Self-pay

## 2021-02-15 ENCOUNTER — Telehealth: Payer: Self-pay | Admitting: Neurology

## 2021-02-15 VITALS — BP 124/78 | HR 68 | Ht 67.0 in | Wt 160.0 lb

## 2021-02-15 DIAGNOSIS — F14988 Cocaine use, unspecified with other cocaine-induced disorder: Secondary | ICD-10-CM

## 2021-02-15 DIAGNOSIS — I613 Nontraumatic intracerebral hemorrhage in brain stem: Secondary | ICD-10-CM

## 2021-02-15 DIAGNOSIS — I69154 Hemiplegia and hemiparesis following nontraumatic intracerebral hemorrhage affecting left non-dominant side: Secondary | ICD-10-CM

## 2021-02-15 DIAGNOSIS — I999 Unspecified disorder of circulatory system: Secondary | ICD-10-CM

## 2021-02-15 NOTE — Patient Instructions (Signed)
I had a long discussion with the patient and his sister regarding his pontine hemorrhage and residual spastic left hemiplegia and answered questions.  I recommend continued strict control of hypertension with blood pressure goal below 130/90.  I complemented him on quitting smoking cigarettes, marijuana and cocaine abuse.  I recommend outpatient physical and occupational therapy.  He will return for follow-up in the future in 3 months or call earlier if necessary.  Stroke Prevention Some medical conditions and behaviors are associated with a higher chance of having a stroke. You can help prevent a stroke by making nutrition, lifestyle, and other changes, including managing any medical conditions you may have. What nutrition changes can be made?  Eat healthy foods. You can do this by: ? Choosing foods high in fiber, such as fresh fruits and vegetables and whole grains. ? Eating at least 5 or more servings of fruits and vegetables a day. Try to fill half of your plate at each meal with fruits and vegetables. ? Choosing lean protein foods, such as lean cuts of meat, poultry without skin, fish, tofu, beans, and nuts. ? Eating low-fat dairy products. ? Avoiding foods that are high in salt (sodium). This can help lower blood pressure. ? Avoiding foods that have saturated fat, trans fat, and cholesterol. This can help prevent high cholesterol. ? Avoiding processed and premade foods.  Follow your health care provider's specific guidelines for losing weight, controlling high blood pressure (hypertension), lowering high cholesterol, and managing diabetes. These may include: ? Reducing your daily calorie intake. ? Limiting your daily sodium intake to 1,500 milligrams (mg). ? Using only healthy fats for cooking, such as olive oil, canola oil, or sunflower oil. ? Counting your daily carbohydrate intake.   What lifestyle changes can be made?  Maintain a healthy weight. Talk to your health care provider about  your ideal weight.  Get at least 30 minutes of moderate physical activity at least 5 days a week. Moderate activity includes brisk walking, biking, and swimming.  Do not use any products that contain nicotine or tobacco, such as cigarettes and e-cigarettes. If you need help quitting, ask your health care provider. It may also be helpful to avoid exposure to secondhand smoke.  Limit alcohol intake to no more than 1 drink a day for nonpregnant women and 2 drinks a day for men. One drink equals 12 oz of beer, 5 oz of wine, or 1 oz of hard liquor.  Stop any illegal drug use.  Avoid taking birth control pills. Talk to your health care provider about the risks of taking birth control pills if: ? You are over 59 years old. ? You smoke. ? You get migraines. ? You have ever had a blood clot. What other changes can be made?  Manage your cholesterol levels. ? Eating a healthy diet is important for preventing high cholesterol. If cholesterol cannot be managed through diet alone, you may also need to take medicines. ? Take any prescribed medicines to control your cholesterol as told by your health care provider.  Manage your diabetes. ? Eating a healthy diet and exercising regularly are important parts of managing your blood sugar. If your blood sugar cannot be managed through diet and exercise, you may need to take medicines. ? Take any prescribed medicines to control your diabetes as told by your health care provider.  Control your hypertension. ? To reduce your risk of stroke, try to keep your blood pressure below 130/80. ? Eating a healthy diet and  exercising regularly are an important part of controlling your blood pressure. If your blood pressure cannot be managed through diet and exercise, you may need to take medicines. ? Take any prescribed medicines to control hypertension as told by your health care provider. ? Ask your health care provider if you should monitor your blood pressure at  home. ? Have your blood pressure checked every year, even if your blood pressure is normal. Blood pressure increases with age and some medical conditions.  Get evaluated for sleep disorders (sleep apnea). Talk to your health care provider about getting a sleep evaluation if you snore a lot or have excessive sleepiness.  Take over-the-counter and prescription medicines only as told by your health care provider. Aspirin or blood thinners (antiplatelets or anticoagulants) may be recommended to reduce your risk of forming blood clots that can lead to stroke.  Make sure that any other medical conditions you have, such as atrial fibrillation or atherosclerosis, are managed. What are the warning signs of a stroke? The warning signs of a stroke can be easily remembered as BEFAST.  B is for balance. Signs include: ? Dizziness. ? Loss of balance or coordination. ? Sudden trouble walking.  E is for eyes. Signs include: ? A sudden change in vision. ? Trouble seeing.  F is for face. Signs include: ? Sudden weakness or numbness of the face. ? The face or eyelid drooping to one side.  A is for arms. Signs include: ? Sudden weakness or numbness of the arm, usually on one side of the body.  S is for speech. Signs include: ? Trouble speaking (aphasia). ? Trouble understanding.  T is for time. ? These symptoms may represent a serious problem that is an emergency. Do not wait to see if the symptoms will go away. Get medical help right away. Call your local emergency services (911 in the [place] Do not drive yourself to the hospital.  Other signs of stroke may include: ? A sudden, severe headache with no known cause. ? Nausea or vomiting. ? Seizure. Where to find more information For more information, visit:  American Stroke Association: www.strokeassociation.org  National Stroke Association: www.stroke.org Summary  You can prevent a stroke by eating healthy, exercising, not smoking, limiting  alcohol intake, and managing any medical conditions you may have.  Do not use any products that contain nicotine or tobacco, such as cigarettes and e-cigarettes. If you need help quitting, ask your health care provider. It may also be helpful to avoid exposure to secondhand smoke.  Remember BEFAST for warning signs of stroke. Get help right away if you or a loved one has any of these signs. This information is not intended to replace advice given to you by your health care provider. Make sure you discuss any questions you have with your health care provider. Document Revised: 11/23/2017 Document Reviewed: 01/16/2017 Elsevier Patient Education  2021 [employer].

## 2021-02-15 NOTE — Progress Notes (Signed)
[facility] [address] [place]. [place] [address] [phone]       OFFICE FOLLOW-UP NOTE  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  Is accompanied by sister.  History is obtained from them and review of electronic medical records and I personally reviewed pertinent imaging films in PACS.  Zachary Flores has past medical history of hypertension and cocaine and alcohol abuse who presented on 09/06/2020 to [facility] with sudden onset of left-sided weakness which Zachary Flores noticed when Zachary Flores woke up that morning.  Zachary Flores also had some double vision which worsened slightly when Zachary Flores presented.  His blood pressure only mildly elevated 150/99 but CT scan showed a large Large 21 x 14 mm upper brainstem hematoma involving the pons with volume of 2.8 mL.  There was severe chronic small vessel disease changes noted.  CT angiogram of the brain and neck both did not show significant large vessel stenosis or occlusion.  Patient was kept in the ICU and blood pressure tightly monitored.  Zachary Flores remained neurologically stable and follow-up CT scan showed stable appearance of the hemorrhage.  His echocardiogram showed ejection fraction 55 to 60%.  LDL cholesterol was 100 mg percent and hemoglobin A1c was 5.9.  Urine drug screen was positive for cocaine.  Patient was transferred to inpatient rehab where Zachary Flores stayed for 4 weeks and then was eventually transferred to skilled nursing facility in [place] area.  Patient has shown some improvement and is able to speak and swallow is currently living at home with his daughter but is still unable to get up and walk even with assistance and is mostly wheelchair-bound.  Zachary Flores is currently not getting any home physical therapy and family is wondering if Zachary Flores will  benefit from outpatient therapy.  His blood pressures well controlled and today it is 124/78.  Zachary Flores has quit drinking alcohol, smoking cigarettes and using cocaine since his hemorrhage.  Patient wants to eventually learn how to walk and live in an independent apartment.  Zachary Flores has no new complaints today. ROS:   14 system review of systems is positive for weakness, imbalance, difficulty walking, slurred speech all other systems negative  PMH:  Past Medical History:  Diagnosis Date  . Hypertension   . Stroke [facility])     Social History:  Social History   Socioeconomic History  . Marital status: Divorced    Spouse name: Not on file  . Number of children: Not on file  . Years of education: Not on file  . Highest education level: Not on file  Occupational History  . Not on file  Tobacco Use  . Smoking status: Former Games developer  . Smokeless tobacco: Never Used  Substance and Sexual Activity  . Alcohol use: Not Currently    Alcohol/week: 24.0 standard drinks    Types: 24 Cans of beer per week  . Drug use: Yes    Types: Cocaine    Comment: quit 08/2020  . Sexual activity: Not on file  Other Topics Concern  . Not on file  Social History Narrative   Lives with daughter   Right Handed   Drinks no caffeine   Social Determinants of Health   Financial Resource Strain: Not on file  Food Insecurity: Not on file  Transportation Needs: Not on file  Physical Activity: Not on  file  Stress: Not on file  Social Connections: Not on file  Intimate Partner Violence: Not on file    Medications:   Current Outpatient Medications on File Prior to Visit  Medication Sig Dispense Refill  . acetaminophen (TYLENOL) 325 MG tablet Take 2 tablets (650 mg total) by mouth every 4 (four) hours as needed for mild pain (temp >99.5 F).    . folic acid (FOLVITE) 1 MG tablet Take 1 tablet (1 mg total) by mouth daily.    Marland Kitchen ibuprofen (ADVIL) 200 MG tablet Take 200 mg by mouth every 6 (six) hours as needed.    .  loperamide (IMODIUM) 2 MG capsule Take 1 capsule (2 mg total) by mouth every 4 (four) hours as needed for diarrhea or loose stools. 30 capsule 0  . loratadine (CLARITIN) 10 MG tablet Take 1 tablet (10 mg total) by mouth daily.    . metoprolol succinate (TOPROL-XL) 25 MG 24 hr tablet Take 25 mg by mouth daily.    . Multiple Vitamin (MULTIVITAMIN WITH MINERALS) TABS tablet Take 1 tablet by mouth daily.     No current facility-administered medications on file prior to visit.    Allergies:  No Known Allergies  Physical Exam General: well developed, well nourished middle-aged African-American male, seated, in no evident distress Head: head normocephalic and atraumatic.  Neck: supple with no carotid or supraclavicular bruits Cardiovascular: regular rate and rhythm, no murmurs Musculoskeletal: no deformity Skin:  no rash/petichiae Vascular:  Normal pulses all extremities Vitals:   02/15/21 1124  BP: 124/78  Pulse: 68   Neurologic Exam Mental Status: Awake and fully alert. Oriented to place and time. Recent and remote memory intact. Attention span, concentration and fund of knowledge appropriate. Mood and affect appropriate.  Mild dysarthria.  No aphasia. Cranial Nerves: Fundoscopic exam reveals sharp disc margins. Pupils equal, briskly reactive to light. Extraocular movements full without nystagmus. Visual fields full to confrontation. Hearing intact.  Left lower facial weakness.  Facial sensation intact. Face, tongue, palate moves normally and symmetrically.  Motor: Spastic left hemiparesis 3/5 strength with significant weakness of left grip and wrist as well as ankle dorsiflexors.  Tone is increased on the left compared to the right.   Sensory.: intact to touch ,pinprick .position and vibratory sensation.  Coordination: Impaired finger-to-nose and needle coordination on the left and intact on the right. Gait and Station: Unable to test as patient is in a wheelchair and cannot walk at baseline  even with 1 person assist. Reflexes: 2+ and asymmetric and brisker on the left. Toes downgoing.   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  Zachary Flores has significant residual left hemiplegia, hemiataxia and gait difficulty.     PLAN: I had a long discussion with the patient and his sister regarding his pontine hemorrhage and residual spastic left hemiplegia and answered questions.  I recommend continued strict control of hypertension with blood pressure goal below 130/90.  I complemented him on quitting smoking cigarettes, marijuana and cocaine abuse.  I recommend outpatient physical and occupational therapy.  Zachary Flores will return for follow-up in the future in 3 months or call earlier if necessary. Greater than 50% of time during this 30 minute visit was spent on counseling,explanation of diagnosis of pontine hemorrhage and hemiplegia, planning of further management, discussion with patient and family and coordination of care Delia Heady, MD  [facility] [phone] [address]  [address] [place], [place] [address]  Phone [phone] Fax [phone] Note: This document was prepared with digital dictation and possible smart phrase technology. Any transcriptional errors that result from this process are unintentional

## 2021-02-15 NOTE — Telephone Encounter (Signed)
Zachary Flores -  Can you see if Dr. Pearlean Brownie recommend in home therapy. The daughters home has stairs and they are struggling getting him in and out. Or does Dr. Pearlean Brownie know of a service that can assist them with transport? Thank you

## 2021-02-16 NOTE — Telephone Encounter (Signed)
Patient's sister also asked if he could received in home PT therapy until he gets stronger as well as get transportation set up, then he can start coming in to Rehab.    Request sent to Dr. Pearlean Brownie

## 2021-02-16 NOTE — Telephone Encounter (Signed)
Yes I believe there is [employer] service for patient with disabilities to help with transportation for medical appointments.  Kindly find the number and that her know

## 2021-02-16 NOTE — Telephone Encounter (Signed)
Called patient back and LVM with [employer] transportation information and phone number.  [phone]

## 2021-02-23 ENCOUNTER — Other Ambulatory Visit: Payer: Self-pay

## 2021-02-23 ENCOUNTER — Encounter: Payer: Self-pay | Admitting: Critical Care Medicine

## 2021-02-23 ENCOUNTER — Ambulatory Visit: Payer: Medicaid Other | Attending: Critical Care Medicine | Admitting: Critical Care Medicine

## 2021-02-23 ENCOUNTER — Telehealth: Payer: Self-pay

## 2021-02-23 DIAGNOSIS — R32 Unspecified urinary incontinence: Secondary | ICD-10-CM

## 2021-02-23 DIAGNOSIS — R339 Retention of urine, unspecified: Secondary | ICD-10-CM

## 2021-02-23 DIAGNOSIS — K5901 Slow transit constipation: Secondary | ICD-10-CM

## 2021-02-23 DIAGNOSIS — I700 Atherosclerosis of aorta: Secondary | ICD-10-CM

## 2021-02-23 DIAGNOSIS — F1491 Cocaine use, unspecified, in remission: Secondary | ICD-10-CM

## 2021-02-23 DIAGNOSIS — I69354 Hemiplegia and hemiparesis following cerebral infarction affecting left non-dominant side: Secondary | ICD-10-CM | POA: Diagnosis not present

## 2021-02-23 DIAGNOSIS — L89302 Pressure ulcer of unspecified buttock, stage 2: Secondary | ICD-10-CM

## 2021-02-23 DIAGNOSIS — I10 Essential (primary) hypertension: Secondary | ICD-10-CM

## 2021-02-23 DIAGNOSIS — Z87898 Personal history of other specified conditions: Secondary | ICD-10-CM

## 2021-02-23 DIAGNOSIS — R7989 Other specified abnormal findings of blood chemistry: Secondary | ICD-10-CM

## 2021-02-23 DIAGNOSIS — R001 Bradycardia, unspecified: Secondary | ICD-10-CM

## 2021-02-23 MED ORDER — VALSARTAN 80 MG PO TABS: 80.0000 mg | ORAL_TABLET | Freq: Every day | ORAL | 1 refills | Status: DC

## 2021-02-23 MED ORDER — ATORVASTATIN CALCIUM 20 MG PO TABS: 20.0000 mg | ORAL_TABLET | Freq: Every day | ORAL | 3 refills | Status: AC

## 2021-02-23 NOTE — Assessment & Plan Note (Signed)
Patient still with diastolic hypertension off all medications pulse rate is 60  Discontinue metoprolol begin valsartan 80 mg daily

## 2021-02-23 NOTE — Progress Notes (Signed)
Subjective:    Patient ID: Zachary Flores, male    DOB: 1966/09/04, 55 y.o.   MRN: [id number] Virtual Visit via Telephone Note  I connected with Jonelle Sidle on 02/23/21 at  8:30 AM EST by telephone and verified that I am speaking with the correct person using two identifiers.   Consent:  I discussed the limitations, risks, security and privacy concerns of performing an evaluation and management service by telephone and the availability of in person appointments. I also discussed with the patient that there may be a patient responsible charge related to this service. The patient expressed understanding and agreed to proceed.  Location of patient: Patient lives with daughter he is in the background the daughter Ms. McCart is on the phone  Location of provider: I am in my office  Persons participating in the televisit with the patient.    Daughter is with the patient   History of Present Illness:   55 y.o.M PCP to est  02/23/2021 Hx recent CVA, HTN this patient is seen by way of a telephone visit to connect for primary care.  I spoke to the patient's daughter Quin Hoop the patient was admitted back in September 2021 for a hemorrhagic pontine stroke secondary to hypertension cocaine use and alcohol use patient suffered significant deficits in his has left hemiplegia and ataxic gait and is wheelchair-bound.  He generally uses right arm.  He is able to swallow and talk.  He is able to sleep in a regular bed does not fall out of the bed.  He uses depends pull-ups.  On arrival the patient's blood pressure was taken by the daughter over the phone and resupported is 133/99 pulse is 60.  He was supposed to be on metoprolol and lisinopril but has not been on any these medicines since discharge from the nursing home in [place] and December.  The daughter is struggling to care for the patient.  She has a small 57-year-old in her home.  Her boyfriend comes over at times.  Below are  documentations from the discharge summary both in the hospital rehab as well as neurology follow-ups DC Summary   Admit date: 09/06/2020 Discharge date: 09/15/2020  PCP: Patient, No Pcp Per  DISCHARGE DIAGNOSES:  Acute hemorrhagic stroke and central pontine Cocaine abuse Essential hypertension Acute delirium, improving Urinary retention   PATIENT BEING DISCHARGED TO CIR   RECOMMENDATIONS FOR OUTPATIENT FOLLOW UP: 1. Voiding trial once patient has improved mobility    Home Health: Per CIR Equipment/Devices: Per CIR  CODE STATUS: Full code  DISCHARGE CONDITION: fair  Diet recommendation: Dysphagia 2 diet with thin liquids  INITIAL HISTORY: 55 year old with hypertension supposed to be on lisinopril 5 mg metoprolol 25 twice daily , cocaine abuse presented with presented to the ED on 09/06/2020 w/ double vision starting that day and found to have left-sided weakness. 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 Patient had been agitated tachypneic diaphoretic needing Precedex drip CIWA Ativan and on intermittent antihypertensive drip. Following weaning off of drip, transferred to hospitalist service on 9/16. Currently being considered for inpatient rehab.   HOSPITAL COURSE:   Acute Central Pontine hemorrhage in the setting of hypertension and cocaine use Patient underwent work-up as per the hemorrhagic stroke protocol with neuro ICU with CT head and neck, repeat CT head with a stable pontine hemorrhage, extensive small vessel disease, 2D echo EF 55 to 60%, no source of embolus, LDL 100, hemoglobin A1c 5.9.  Patient also had a cortrack feeding tube which was removed after he did well on a modified barium swallow.  SLP continues to follow. Diet being advanced slowly. Currently on a dysphagia 2 diet. Stable from a neurological standpoint.  Patient noticing some improvement in the strength in the left upper and lower  extremities.  Delirium/acute toxic metabolic encephalopathy/alcohol withdrawal syndrome with complication Initially on Precedex.Alsoreceived Haldol as needed. Was started on low-dose Seroquel. Seen and cleared by psychiatry as well. Continue folic acid multivitamins and thiamine. From a behavioral standpoint he is stable. Tolerating his medications well.   Essential hypertension Blood pressure is reasonably well controlled. Continue lisinopril and metoprolol.   Electrolytes are normal this morning.  Renal function is normal.  Left arm pain Probably musculoskeletal or related to his deficits from stroke.Pain has improved with analgesic agents.  Continue to monitor  Cocaine abuse Has been counseled.  Urine retention Patient had a coud catheter which is in place. Continue Flomax. Voiding trial once he is more mobile.  DC Rehab Note: Admit date: 09/15/2020 Discharge date: 11/11/2020  Discharge Diagnoses:  Principal Problem:   Pontine hemorrhage (HCC) Active Problems:   Right pontine cerebrovascular accident (HCC)   Pressure injury of skin   Urinary retention   History of hypertension   Drug-induced hypotension   Thrombocytosis   Hypotension   Labile blood pressure   Hemiparesis affecting left side as late effect of stroke (HCC)   Blood pressure increase diastolic   Vascular headache   Slow transit constipation   Urinary incontinence   Salmonella gastroenteritis   Bradycardia History of alcohol tobacco use  Discharged Condition: Stable  Significant Diagnostic Studies:  Imaging Results DG Abd 1 View  Result Date: 10/29/2020 CLINICAL DATA:  Abdominal pain EXAM: ABDOMEN - 1 VIEW COMPARISON:  None. FINDINGS: The bowel gas pattern is normal. No radio-opaque calculi or other significant radiographic abnormality are seen. IMPRESSION: Negative. Electronically Signed   By: Jonna Clark M.D.   On: 10/29/2020 20:09   DG CHEST PORT 1 VIEW  Result Date:  10/29/2020 CLINICAL DATA:  Tachypnea. EXAM: PORTABLE CHEST 1 VIEW COMPARISON:  None. FINDINGS: Low lung volumes. Normal heart size for technique. Normal mediastinal contours. No focal airspace disease. No pulmonary edema. No pneumothorax or large pleural effusion. Degenerative change of both shoulders. No acute osseous abnormalities are seen. IMPRESSION: Low lung volumes without acute abnormality. Electronically Signed   By: Narda Rutherford M.D.   On: 10/29/2020 23:22    Labs:  Basic Metabolic Panel: Last Labs  No results for input(s): NA, K, CL, CO2, GLUCOSE, BUN, CREATININE, CALCIUM, MG, PHOS in the last 168 hours.   CBC: Last Labs  No results for input(s): WBC, NEUTROABS, HGB, HCT, MCV, PLT in the last 168 hours.   CBG: Last Labs  No results for input(s): GLUCAP in the last 168 hours.   Family history. Unknown  Brief HPI:   Jorgeluis Gurganus is a 55 y.o. right-handed male with history of hypertension as well as alcohol and tobacco use. Patient lives with his brother works in [employer]. Presented 09/06/2020 with left-sided weakness dysarthria and diplopia. Blood pressure noted to be 150/99. CT of the head as well as angiogram of the head and neck showed a 2.8 mL acute pontine hemorrhage. Severe chronic small vessel ischemic disease. Admission chemistries glucose 188 SARS coronavirus negative urine drug screen positive cocaine. Neurology follow-up conservative care monitoring of blood pressure. Latest echocardiogram with ejection fraction of 55 to 60% without emboli.  Patient did have episodes of agitation and restlessness needing Precedex as well as CIWA protocol. Hospital course further complicated by dysphagia initially with nasogastric tube for nutritional support diet advanced to a dysphagia #2 thin liquid. Therapy evaluations completed and patient was admitted for a comprehensive rehab program.   Hospital Course: Misael Mcgaha was admitted to rehab 09/15/2020 for  inpatient therapies to consist of PT, ST and OT at least three hours five days a week. Past admission physiatrist, therapy team and rehab RN have worked together to provide customized collaborative inpatient rehab. Pertaining to patient's central pontine hemorrhage in the setting of hypertension and cocaine use. Remained stable close monitoring of blood pressure. Patient had been on Lopressor 25 mg twice daily decreased to 12.5 mg twice daily and later discontinued on 09/302021 due to some hypotension. SCDs for DVT prophylaxis. His diet was slowly advanced to regular consistency. Patient with history of polysubstance use alcohol tobacco use urine drug screen positive cocaine. Patient did receive counts regards to cessation of alcohol tobacco and illicit drug use. He did have occasional bouts of urinary incontinence doing well with routine toileting. Transient constipation resolved. Patient with Salmonella gastroenteritis resolved C. difficile negative KUB unremarkable. No nausea vomiting.   Blood pressures were monitored on TID basis and soft and monitored     Rehab course: During patient's stay in rehab weekly team conferences were held to monitor patient's progress, set goals and discuss barriers to discharge. At admission, patient required max assist supine to sit max assist sit to supine moderate assist sit stand. Max assist grooming  Physical exam. Blood pressure 110/85 pulse 94 temperature 97.8 respirations 18 oxygen saturations 99% room air Constitutional. No acute distress HEENT Head. Normocephalic and atraumatic Neck. Supple nontender no JVD without thyromegaly Eyes. Pupils round and reactive to light no discharge without nystagmus Cardiac regular rate rhythm without any extra sounds or murmur heard Abdomen. Soft nontender positive bowel sounds without rebound Respiratory effort normal no respiratory distress without wheeze Musculoskeletal. No edema or tenderness  extremities Neurologic. He was alert oriented x2 dysarthric but intelligible follow commands Limited awareness of his deficits. Motor. Right upper extremity 4+/5 proximal distal Right lower extremity 4/5 proximal distal Left upper extremity 4/5 proximal distal Left lower extremity 3+/5 proximal distal  He/  has had improvement in activity tolerance, balance, postural control as well as ability to compensate for deficits. He/ has had improvement in functional use RUE/LUE  and RLE/LLE as well as improvement in awareness. Perform stand pivot transfers with moderate assist and use of a grab bar. Patient sat on commode with supervision was continent of bladder. Required mod assist for standing balance and total assist for pulling up pants over hips. Patient was somewhat impulsive during transfers requiring cues for safety awareness. Patient with improved ability to produce efficient transfers focused remainder of therapy sessions with wheelchair propulsion where he propelled with bilateral lower extremities on level surfaces with moderate assist. Completed sit to stand and standing balance with minimal assist to brush teeth. Patient transport to the bathroom completed squat pivot wheelchair drop arm bedside commode with minimal assist. Completed sit to stand in order to pull pants down over hips with minimal assist. Follow-up speech therapy for cognitive linguistic deficits exhibiting mild to moderate deficits primarily in problem solving awareness and attention. Arrangements were made for skilled nursing facility placement with discharge taking place 11/11/2020 to the [facility].   ED 12/29/20 Patient states for the past 4 days he has been having intermittent  spasming pain of his abdomen.  He has not had a bowel movement for 4 days, this is abnormal for him.  He states he is still passing gas.  He states pain is severe, nothing makes it better or worse.  He has not taken anything for it.  He also reports  bilateral leg cramping, worse when he is doing activity.  He is currently in rehab due to recent history of a stroke causing left-sided weakness.  He denies fevers, chills, chest pain, breath, cough, nausea, vomiting, urinary symptoms.  He denies recent medication changes.  Additional history obtained per chart review.  Patient had a hemorrhagic stroke 4 months ago. He lives with a roommate.    Patient presenting for evaluation of abdominal pain and constipation.  On exam, patient peers nontoxic.  He does have diffuse tenderness palpation the abdomen with minimal bowel sounds.  Concerns for possible bowel obstruction.  As patient is also having pain worse on the left side, describes it as a spasm pain, consider infection such as diverticulitis.  Consider viral GI illness.  Patient also with bilateral muscle leg spasming/cramping, consider dehydration.  Will check CK.  Labs obtained from triage interpreted by me, overall reassuring.  No leukocytosis.  Electrolytes stable.  UA pending.  CT abdomen pelvis pending.  CT negative for acute findings. Ck minimally elevated at 403, fluids given. On recheck, pt states sxs are improved. He has dry MM, and in the setting of muscle cramps and constipation, favor dehydration. Will tx constipation, pt requesting mag citrate. Discussed hydration. Pt to f/u with pcp. At this time, pt appears safe for d/c. Return precautions given. Pt states he understands and agrees to plan.   Final Clinical Impression(s) / ED Diagnoses Final diagnoses: Dehydration Abdominal spasms Constipation, unspecified constipation type  Neuro OV 02/15/21  HPI: Mr. Warren DanesCarpenter is a 55 year old African-American male seen today for initial office follow-up visit following hospital admission for intracerebral hemorrhage in September 2021.  Is accompanied by sister.  History is obtained from them and review of electronic medical records and I personally reviewed pertinent imaging films in PACS.   He has past medical history of hypertension and cocaine and alcohol abuse who presented on 09/06/[facility]  with sudden onset of left-sided weakness which he noticed when he woke up that morning.  He also had some double vision which worsened slightly when he presented.  His blood pressure only mildly elevated 150/99 but CT scan showed a large Large 21 x 14 mm upper brainstem hematoma involving the pons with volume of 2.8 mL.  There was severe chronic small vessel disease changes noted.  CT angiogram of the brain and neck both did not show significant large vessel stenosis or occlusion.  Patient was kept in the ICU and blood pressure tightly monitored.  He remained neurologically stable and follow-up CT scan showed stable appearance of the hemorrhage.  His echocardiogram showed ejection fraction 55 to 60%.  LDL cholesterol was 100 mg percent and hemoglobin A1c was 5.9.  Urine drug screen was positive for cocaine.  Patient was transferred to inpatient rehab where he stayed for 4 weeks and then was eventually transferred to skilled nursing facility in [place] area.  Patient has shown some improvement and is able to speak and swallow is currently living at home with his daughter but is still unable to get up and walk even with assistance and is mostly wheelchair-bound.  He is currently not getting any home physical therapy and family  is wondering if he will benefit from outpatient therapy.  His blood pressures well controlled and today it is 124/78.  He has quit drinking alcohol, smoking cigarettes and using cocaine since his hemorrhage.  Patient wants to eventually learn how to walk and live in an independent apartment.  He has no new complaints today  54 year old African-American male with pontine hemorrhage in September 2021 secondary to hypertensive and cocaine abuse.  He has significant residual left hemiplegia, hemiataxia and gait difficulty.  PLAN: I had a long discussion with the  patient and his sister regarding his pontine hemorrhage and residual spastic left hemiplegia and answered questions.  I recommend continued strict control of hypertension with blood pressure goal below 130/90.  I complemented him on quitting smoking cigarettes, marijuana and cocaine abuse.  I recommend outpatient physical and occupational therapy.  He will return for follow-up in the future in 3 months or call earlier if necessary.   The patient does have full Medicaid the daughter is requesting potential placement in a long-term care she is no longer able to care for the patient.  He was in the ER in January for dehydration she states he is hydrating better now and his bowels are moving more normally.      Review of Systems  HENT: Negative.   Respiratory: Negative.   Cardiovascular: Negative.   Gastrointestinal: Negative.   Musculoskeletal: Positive for gait problem.  Neurological: Positive for weakness.  Psychiatric/Behavioral: Negative.        Objective:   Physical Exam no exam this is a phone visit   CT Abd 12/2020  IMPRESSION: 1. No acute findings in the abdomen/pelvis. 2. Subcentimeter upper pole left renal cortical hypodensity too small to characterize, but likely a cyst. 3. Aortic atherosclerosis.  Aortic Atherosclerosis (ICD10-I70.0).    Assessment & Plan:  I personally reviewed all images and lab data in the [facility] system as well as any outside material available during this office visit and agree with the  radiology impressions.   Essential hypertension Patient still with diastolic hypertension off all medications pulse rate is 60  Discontinue metoprolol begin valsartan 80 mg daily  Aortic atherosclerosis (HCC) Aortic atherosclerosis seen on recent imaging  Begin atorvastatin 20 mg daily  Slow transit constipation This appears to be resolved  Hemiparesis affecting left side as late effect of stroke (HCC) History of left hemiparesis a result of stroke  status post right pontine cerebrovascular accident with hemorrhage  Patient is wheelchair-bound the daughter can no longer care for him  Plan to bring the patient in for direct exam and attempt to see if we can get him placed in a long-term care we will partner with our nurse case manager  Pressure injury of skin No apparent skin issues now we will assess when he comes to the office  Urinary retention Patient urinating normally this has resolved  History of alcohol use disorder Patient currently not drinking alcohol active  Bradycardia Persistent bradycardia will not give metoprolol  Elevated LFTs This has resolved  Urinary incontinence Patient is diaper dependent  History of cocaine use Not currently active   Truman was seen today for establish care.  Diagnoses and all orders for this visit:  Essential hypertension  Aortic atherosclerosis (HCC)  Slow transit constipation  Hemiparesis affecting left side as late effect of stroke (HCC)  Pressure injury of buttock, stage 2, unspecified laterality (HCC)  Urinary retention  History of alcohol use disorder  Bradycardia  Elevated LFTs  Urinary incontinence, unspecified  type  History of cocaine use  Other orders -     atorvastatin (LIPITOR) 20 MG tablet; Take 1 tablet (20 mg total) by mouth daily. -     valsartan (DIOVAN) 80 MG tablet; Take 1 tablet (80 mg total) by mouth daily.    Follow Up Instructions:   Patient's daughter knows the patient we brought in for direct exam in 1 week to see if we can get him into long-term care I discussed the assessment and treatment plan with the patient. The patient was provided an opportunity to ask questions and all were answered. The patient agreed with the plan and demonstrated an understanding of the instructions.   The patient was advised to call back or seek an in-person evaluation if the symptoms worsen or if the condition fails to improve as anticipated.  I  provided 30 minutes of non-face-to-face time during this encounter  including  median intraservice time , review of notes, labs, imaging, medications  and explaining diagnosis and management to the patient .    Shan Levans, MD

## 2021-02-23 NOTE — Addendum Note (Signed)
Addended by: Shan Levans E on: 02/23/2021 07:00 PM   Modules accepted: Level of Service

## 2021-02-23 NOTE — Progress Notes (Signed)
Establish care and needs to get into rehab, home equipment related to stroke

## 2021-02-23 NOTE — Assessment & Plan Note (Signed)
Aortic atherosclerosis seen on recent imaging  Begin atorvastatin 20 mg daily

## 2021-02-23 NOTE — Assessment & Plan Note (Signed)
Patient is diaper dependent

## 2021-02-23 NOTE — Telephone Encounter (Signed)
Call placed to [facility] to schedule transportation for patient to appointment at [facility] 03/01/2021 @ 1030 and was informed that transportation needs to be scheduled through [facility].   Call placed to patient's daughter, Zachary Flores, and explained that this CM can arrange transportation for the patient through [facility].  She confirmed that patient needs wheelchair transport.  There are 4 steps into the front door and 2 steps in the back door. [name] said that she could follow the transport vehicle in her own car.  Call placed to [name]# [phone], spoke to [name] and set up account for transportation.  Updated her with patient's current address and daughter's phone number. Wheelchair transport scheduled for pick up @ 0930 03/01/2021.Marland Kitchen Daughter can accompany him as an escort. Confirmation # [id number]. His daughter can call [name] for pick up when he is finished with appt and ready to return home. [name] will call the daughter the day prior to the appointment as well as the day of the appointment to confirm.  Call placed to [facility] and explained information from call with [name] and she said she understood.

## 2021-02-23 NOTE — Assessment & Plan Note (Signed)
This appears to be resolved

## 2021-02-23 NOTE — Assessment & Plan Note (Signed)
Persistent bradycardia will not give metoprolol

## 2021-02-23 NOTE — Assessment & Plan Note (Signed)
Patient currently not drinking alcohol active

## 2021-02-23 NOTE — Assessment & Plan Note (Signed)
Not currently active.  

## 2021-02-23 NOTE — Assessment & Plan Note (Signed)
No apparent skin issues now we will assess when he comes to the office

## 2021-02-23 NOTE — Assessment & Plan Note (Signed)
History of left hemiparesis a result of stroke status post right pontine cerebrovascular accident with hemorrhage  Patient is wheelchair-bound the daughter can no longer care for him  Plan to bring the patient in for direct exam and attempt to see if we can get him placed in a long-term care we will partner with our nurse case manager

## 2021-02-23 NOTE — Assessment & Plan Note (Signed)
This has resolved.

## 2021-02-23 NOTE — Assessment & Plan Note (Signed)
Patient urinating normally this has resolved

## 2021-03-01 ENCOUNTER — Telehealth: Payer: Self-pay

## 2021-03-01 ENCOUNTER — Ambulatory Visit: Payer: Medicaid Other | Attending: Critical Care Medicine | Admitting: Critical Care Medicine

## 2021-03-01 ENCOUNTER — Encounter: Payer: Self-pay | Admitting: Critical Care Medicine

## 2021-03-01 ENCOUNTER — Ambulatory Visit: Payer: Medicaid Other | Admitting: Diagnostic Neuroimaging

## 2021-03-01 ENCOUNTER — Other Ambulatory Visit: Payer: Self-pay

## 2021-03-01 VITALS — BP 126/87 | HR 59 | Temp 98.3°F | Resp 18 | Ht 67.0 in

## 2021-03-01 DIAGNOSIS — R531 Weakness: Secondary | ICD-10-CM

## 2021-03-01 DIAGNOSIS — I619 Nontraumatic intracerebral hemorrhage, unspecified: Secondary | ICD-10-CM

## 2021-03-01 DIAGNOSIS — I700 Atherosclerosis of aorta: Secondary | ICD-10-CM

## 2021-03-01 DIAGNOSIS — I69391 Dysphagia following cerebral infarction: Secondary | ICD-10-CM

## 2021-03-01 DIAGNOSIS — I613 Nontraumatic intracerebral hemorrhage in brain stem: Secondary | ICD-10-CM

## 2021-03-01 DIAGNOSIS — D75839 Thrombocytosis, unspecified: Secondary | ICD-10-CM

## 2021-03-01 DIAGNOSIS — I10 Essential (primary) hypertension: Secondary | ICD-10-CM

## 2021-03-01 DIAGNOSIS — Z23 Encounter for immunization: Secondary | ICD-10-CM | POA: Diagnosis not present

## 2021-03-01 DIAGNOSIS — L89302 Pressure ulcer of unspecified buttock, stage 2: Secondary | ICD-10-CM

## 2021-03-01 NOTE — Assessment & Plan Note (Signed)
Improved The patient is able to stand with impaired gait coordination.  Muscle strength 3/5 with left knee flexion, extension, ankle dorsiflexion and plantar flexion.

## 2021-03-01 NOTE — Telephone Encounter (Signed)
Met with the patient, his daughter, Lowella Grip, and sister, Marnette Burgess, when he was in the clinic today. He used the [employer].   [name] explained how she has been providing all of patient's care and assistance with ADLs.  He is not ambulatory and is a 1 person maximal  assist with transfers. She is becoming exhausted from caring for him and would like to seek placement in a facility at least short term for rehab.  She is not interested in having therapy or personal care assistance in the home for him at this time.  [name] is also requesting placement for patient for rehab with the ultimate goal of having him return to his own apartment to live independently. The patient is in agreement with facility placement for rehab.  [name] and [name] have contacted skilled nursing  facilities in the area and have not been able to locate a bed for him.  [name] requested that this CM contact Randel Books , LCSW at [facility] to inquire if she has suggestions for placement for patient or if [facility] inpatient rehab would take him back.  [name] does not believe that he received the maximum amount of therapy that he should have received at the rehab center in [place].  If [facility] inpatient rehab  is not able to accept him, they are requesting assistance with locating a facility for him. Their first choice is [facility].   This CM explained that there is no guarantee that a facility has beds available, will accept his insurance and would accept him without being fully vaccinated for COVID. He has received 1 dose of the Pfizer vaccine and Dr Joya Gaskins is placing a request for the homebound vaccine program to administer the second dose of the vaccine.    Call placed to [facility], spoke to  Kelly/Admissions who stated that they do not accept patient's insurance. . This information was shared with Maebelle Munroe and the patient before they left the clinic.  Call placed to  [facility], LCSW.  She said that once the patient has been discharged from the facility she is not able to assist.  She explained that he was transferred to [place] with a LOG from [facility]. She also said that he would not be a candidate for inpatient rehab at [facility].

## 2021-03-01 NOTE — Assessment & Plan Note (Signed)
Comprehensive metabolic panel CBC with Differential/Platelet Lipid panel

## 2021-03-01 NOTE — Assessment & Plan Note (Signed)
Controlled Blood pressure today is 126/87 Continue taking Valsartan 1 tablet by mouth daily Comprehensive metabolic panel- future CBC with Differential/Platele-future Lipid panel- future

## 2021-03-01 NOTE — Assessment & Plan Note (Signed)
Resolved Pt swallows liquids and solids with no difficulty

## 2021-03-01 NOTE — Assessment & Plan Note (Signed)
History of left 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 The daughter would like the patient to be placed at a rehab long-term care facility. The facility of choice is [facility] on [address].  The patient and family spoke with our nurse case manager regarding rehab facility and placement. Comprehensive metabolic panel-future CBC with Differential/Platele-future Lipid panel-future

## 2021-03-01 NOTE — Patient Instructions (Signed)
-  You received Tetanus vaccine today -Labs were ordered for you today which includes a metabolic panel, cholesterol check and blood counts -You spoke with our Nurse Case worker regarding potential home placement for rehab. No change in medications Continue Lipitor and valsartan as prescribed. -Return to see Dr. Delford Field in 1 month.

## 2021-03-01 NOTE — Assessment & Plan Note (Signed)
No apparent skin issues noted

## 2021-03-01 NOTE — Progress Notes (Deleted)
Subjective:    Patient ID: Zachary Flores, male    DOB: 1966/08/10, 55 y.o.   MRN: [id number] History of Present Illness:   55 y.o.M PCP to est  02/23/2021 Hx recent CVA, HTN this patient is seen by way of a telephone visit to connect for primary care.  I spoke to the patient's daughter Zachary Flores the patient was admitted back in September 2021 for a hemorrhagic pontine stroke secondary to hypertension cocaine use and alcohol use patient suffered significant deficits in his has left hemiplegia and ataxic gait and is wheelchair-bound.  He generally uses right arm.  He is able to swallow and talk.  He is able to sleep in a regular bed does not fall out of the bed.  He uses depends pull-ups.  On arrival the patient's blood pressure was taken by the daughter over the phone and resupported is 133/99 pulse is 60.  He was supposed to be on metoprolol and lisinopril but has not been on any these medicines since discharge from the nursing home in [place] and December.  The daughter is struggling to care for the patient.  She has a small 72-year-old in her home.  Her boyfriend comes over at times.  Below are documentations from the discharge summary both in the hospital rehab as well as neurology follow-ups DC Summary   Admit date: 09/06/2020 Discharge date: 09/15/2020  PCP: Patient, No Pcp Per  DISCHARGE DIAGNOSES:  Acute hemorrhagic stroke and central pontine Cocaine abuse Essential hypertension Acute delirium, improving Urinary retention   PATIENT BEING DISCHARGED TO CIR   RECOMMENDATIONS FOR OUTPATIENT FOLLOW UP: 1. Voiding trial once patient has improved mobility    Home Health: Per CIR Equipment/Devices: Per CIR  CODE STATUS: Full code  DISCHARGE CONDITION: fair  Diet recommendation: Dysphagia 2 diet with thin liquids  INITIAL HISTORY: 55 year old with hypertension supposed to be on lisinopril 5 mg metoprolol 25 twice daily , cocaine abuse presented with  presented to the ED on 09/06/2020 w/ double vision starting that day and found to have left-sided weakness. 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 Patient had been agitated tachypneic diaphoretic needing Precedex drip CIWA Ativan and on intermittent antihypertensive drip. Following weaning off of drip, transferred to hospitalist service on 9/16. Currently being considered for inpatient rehab.   HOSPITAL COURSE:   Acute Central Pontine hemorrhage in the setting of hypertension and cocaine use Patient underwent work-up as per the hemorrhagic stroke protocol with neuro ICU with CT head and neck, repeat CT head with a stable pontine hemorrhage, extensive small vessel disease, 2D echo EF 55 to 60%, no source of embolus, LDL 100, hemoglobin A1c 5.9.  Patient also had a cortrack feeding tube which was removed after he did well on a modified barium swallow.  SLP continues to follow. Diet being advanced slowly. Currently on a dysphagia 2 diet. Stable from a neurological standpoint.  Patient noticing some improvement in the strength in the left upper and lower extremities.  Delirium/acute toxic metabolic encephalopathy/alcohol withdrawal syndrome with complication Initially on Precedex.Alsoreceived Haldol as needed. Was started on low-dose Seroquel. Seen and cleared by psychiatry as well. Continue folic acid multivitamins and thiamine. From a behavioral standpoint he is stable. Tolerating his medications well.   Essential hypertension Blood pressure is reasonably well controlled. Continue lisinopril and metoprolol.   Electrolytes are normal this morning.  Renal function is normal.  Left arm pain Probably musculoskeletal or related to his deficits from stroke.Pain  has improved with analgesic agents.  Continue to monitor  Cocaine abuse Has been counseled.  Urine retention Patient had a coud catheter which is in place.  Continue Flomax. Voiding trial once he is more mobile.  DC Rehab Note: Admit date: 09/15/2020 Discharge date: 11/11/2020  Discharge Diagnoses:  Principal Problem:   Pontine hemorrhage (HCC) Active Problems:   Right pontine cerebrovascular accident (HCC)   Pressure injury of skin   Urinary retention   History of hypertension   Drug-induced hypotension   Thrombocytosis   Hypotension   Labile blood pressure   Hemiparesis affecting left side as late effect of stroke (HCC)   Blood pressure increase diastolic   Vascular headache   Slow transit constipation   Urinary incontinence   Salmonella gastroenteritis   Bradycardia History of alcohol tobacco use  Discharged Condition: Stable  Significant Diagnostic Studies:  Imaging Results DG Abd 1 View  Result Date: 10/29/2020 CLINICAL DATA:  Abdominal pain EXAM: ABDOMEN - 1 VIEW COMPARISON:  None. FINDINGS: The bowel gas pattern is normal. No radio-opaque calculi or other significant radiographic abnormality are seen. IMPRESSION: Negative. Electronically Signed   By: Jonna Clark M.D.   On: 10/29/2020 20:09   DG CHEST PORT 1 VIEW  Result Date: 10/29/2020 CLINICAL DATA:  Tachypnea. EXAM: PORTABLE CHEST 1 VIEW COMPARISON:  None. FINDINGS: Low lung volumes. Normal heart size for technique. Normal mediastinal contours. No focal airspace disease. No pulmonary edema. No pneumothorax or large pleural effusion. Degenerative change of both shoulders. No acute osseous abnormalities are seen. IMPRESSION: Low lung volumes without acute abnormality. Electronically Signed   By: Narda Rutherford M.D.   On: 10/29/2020 23:22    Labs:  Basic Metabolic Panel: Last Labs  No results for input(s): NA, K, CL, CO2, GLUCOSE, BUN, CREATININE, CALCIUM, MG, PHOS in the last 168 hours.   CBC: Last Labs  No results for input(s): WBC, NEUTROABS, HGB, HCT, MCV, PLT in the last 168 hours.   CBG: Last Labs  No results for input(s): GLUCAP in the  last 168 hours.   Family history. Unknown  Brief HPI:   Zachary Flores is a 55 y.o. right-handed male with history of hypertension as well as alcohol and tobacco use. Patient lives with his brother works in [employer]. Presented 09/06/2020 with left-sided weakness dysarthria and diplopia. Blood pressure noted to be 150/99. CT of the head as well as angiogram of the head and neck showed a 2.8 mL acute pontine hemorrhage. Severe chronic small vessel ischemic disease. Admission chemistries glucose 188 SARS coronavirus negative urine drug screen positive cocaine. Neurology follow-up conservative care monitoring of blood pressure. Latest echocardiogram with ejection fraction of 55 to 60% without emboli. Patient did have episodes of agitation and restlessness needing Precedex as well as CIWA protocol. Hospital course further complicated by dysphagia initially with nasogastric tube for nutritional support diet advanced to a dysphagia #2 thin liquid. Therapy evaluations completed and patient was admitted for a comprehensive rehab program.   Hospital Course: Zachary Flores was admitted to rehab 09/15/2020 for inpatient therapies to consist of PT, ST and OT at least three hours five days a week. Past admission physiatrist, therapy team and rehab RN have worked together to provide customized collaborative inpatient rehab. Pertaining to patient's central pontine hemorrhage in the setting of hypertension and cocaine use. Remained stable close monitoring of blood pressure. Patient had been on Lopressor 25 mg twice daily decreased to 12.5 mg twice daily and later discontinued on 09/302021 due to  some hypotension. SCDs for DVT prophylaxis. His diet was slowly advanced to regular consistency. Patient with history of polysubstance use alcohol tobacco use urine drug screen positive cocaine. Patient did receive counts regards to cessation of alcohol tobacco and illicit drug use. He did have occasional bouts of  urinary incontinence doing well with routine toileting. Transient constipation resolved. Patient with Salmonella gastroenteritis resolved C. difficile negative KUB unremarkable. No nausea vomiting.   Blood pressures were monitored on TID basis and soft and monitored     Rehab course: During patient's stay in rehab weekly team conferences were held to monitor patient's progress, set goals and discuss barriers to discharge. At admission, patient required max assist supine to sit max assist sit to supine moderate assist sit stand. Max assist grooming  Physical exam. Blood pressure 110/85 pulse 94 temperature 97.8 respirations 18 oxygen saturations 99% room air Constitutional. No acute distress HEENT Head. Normocephalic and atraumatic Neck. Supple nontender no JVD without thyromegaly Eyes. Pupils round and reactive to light no discharge without nystagmus Cardiac regular rate rhythm without any extra sounds or murmur heard Abdomen. Soft nontender positive bowel sounds without rebound Respiratory effort normal no respiratory distress without wheeze Musculoskeletal. No edema or tenderness extremities Neurologic. He was alert oriented x2 dysarthric but intelligible follow commands Limited awareness of his deficits. Motor. Right upper extremity 4+/5 proximal distal Right lower extremity 4/5 proximal distal Left upper extremity 4/5 proximal distal Left lower extremity 3+/5 proximal distal  He/  has had improvement in activity tolerance, balance, postural control as well as ability to compensate for deficits. He/ has had improvement in functional use RUE/LUE  and RLE/LLE as well as improvement in awareness. Perform stand pivot transfers with moderate assist and use of a grab bar. Patient sat on commode with supervision was continent of bladder. Required mod assist for standing balance and total assist for pulling up pants over hips. Patient was somewhat impulsive during transfers requiring  cues for safety awareness. Patient with improved ability to produce efficient transfers focused remainder of therapy sessions with wheelchair propulsion where he propelled with bilateral lower extremities on level surfaces with moderate assist. Completed sit to stand and standing balance with minimal assist to brush teeth. Patient transport to the bathroom completed squat pivot wheelchair drop arm bedside commode with minimal assist. Completed sit to stand in order to pull pants down over hips with minimal assist. Follow-up speech therapy for cognitive linguistic deficits exhibiting mild to moderate deficits primarily in problem solving awareness and attention. Arrangements were made for skilled nursing facility placement with discharge taking place 11/11/2020 to the [facility].   ED 12/29/20 Patient states for the past 4 days he has been having intermittent spasming pain of his abdomen.  He has not had a bowel movement for 4 days, this is abnormal for him.  He states he is still passing gas.  He states pain is severe, nothing makes it better or worse.  He has not taken anything for it.  He also reports bilateral leg cramping, worse when he is doing activity.  He is currently in rehab due to recent history of a stroke causing left-sided weakness.  He denies fevers, chills, chest pain, breath, cough, nausea, vomiting, urinary symptoms.  He denies recent medication changes.  Additional history obtained per chart review.  Patient had a hemorrhagic stroke 4 months ago. He lives with a roommate.    Patient presenting for evaluation of abdominal pain and constipation.  On exam, patient peers nontoxic.  He does have diffuse tenderness palpation the abdomen with minimal bowel sounds.  Concerns for possible bowel obstruction.  As patient is also having pain worse on the left side, describes it as a spasm pain, consider infection such as diverticulitis.  Consider viral GI illness.  Patient also with bilateral  muscle leg spasming/cramping, consider dehydration.  Will check CK.  Labs obtained from triage interpreted by me, overall reassuring.  No leukocytosis.  Electrolytes stable.  UA pending.  CT abdomen pelvis pending.  CT negative for acute findings. Ck minimally elevated at 403, fluids given. On recheck, pt states sxs are improved. He has dry MM, and in the setting of muscle cramps and constipation, favor dehydration. Will tx constipation, pt requesting mag citrate. Discussed hydration. Pt to f/u with pcp. At this time, pt appears safe for d/c. Return precautions given. Pt states he understands and agrees to plan.   Final Clinical Impression(s) / ED Diagnoses Final diagnoses: Dehydration Abdominal spasms Constipation, unspecified constipation type  Neuro OV 02/15/21  HPI: Zachary Flores is a 55 year old African-American male seen today for initial office follow-up visit following hospital admission for intracerebral hemorrhage in September 2021.  Is accompanied by sister.  History is obtained from them and review of electronic medical records and I personally reviewed pertinent imaging films in PACS.  He has past medical history of hypertension and cocaine and alcohol abuse who presented on 09/06/2020 to [facility] with sudden onset of left-sided weakness which he noticed when he woke up that morning.  He also had some double vision which worsened slightly when he presented.  His blood pressure only mildly elevated 150/99 but CT scan showed a large Large 21 x 14 mm upper brainstem hematoma involving the pons with volume of 2.8 mL.  There was severe chronic small vessel disease changes noted.  CT angiogram of the brain and neck both did not show significant large vessel stenosis or occlusion.  Patient was kept in the ICU and blood pressure tightly monitored.  He remained neurologically stable and follow-up CT scan showed stable appearance of the hemorrhage.  His echocardiogram showed ejection  fraction 55 to 60%.  LDL cholesterol was 100 mg percent and hemoglobin A1c was 5.9.  Urine drug screen was positive for cocaine.  Patient was transferred to inpatient rehab where he stayed for 4 weeks and then was eventually transferred to skilled nursing facility in [place] area.  Patient has shown some improvement and is able to speak and swallow is currently living at home with his daughter but is still unable to get up and walk even with assistance and is mostly wheelchair-bound.  He is currently not getting any home physical therapy and family is wondering if he will benefit from outpatient therapy.  His blood pressures well controlled and today it is 124/78.  He has quit drinking alcohol, smoking cigarettes and using cocaine since his hemorrhage.  Patient wants to eventually learn how to walk and live in an independent apartment.  He has no new complaints today  55 year old African-American male with pontine hemorrhage in September 2021 secondary to hypertensive and cocaine abuse.  He has significant residual left hemiplegia, hemiataxia and gait difficulty.  PLAN: I had a long discussion with the patient and his sister regarding his pontine hemorrhage and residual spastic left hemiplegia and answered questions.  I recommend continued strict control of hypertension with blood pressure goal below 130/90.  I complemented him on quitting smoking cigarettes, marijuana and cocaine abuse.  I recommend  outpatient physical and occupational therapy.  He will return for follow-up in the future in 3 months or call earlier if necessary.   The patient does have full Medicaid the daughter is requesting potential placement in a long-term care she is no longer able to care for the patient.  He was in the ER in January for dehydration she states he is hydrating better now and his bowels are moving more normally.   03/01/2021 Essential hypertension Patient still with diastolic hypertension off all medications  pulse rate is 60  Discontinue metoprolol begin valsartan 80 mg daily  Aortic atherosclerosis (HCC) Aortic atherosclerosis seen on recent imaging  Begin atorvastatin 20 mg daily  Slow transit constipation This appears to be resolved  Hemiparesis affecting left side as late effect of stroke (HCC) History of left hemiparesis a result of stroke status post right pontine cerebrovascular accident with hemorrhage  Patient is wheelchair-bound the daughter can no longer care for him  Plan to bring the patient in for direct exam and attempt to see if we can get him placed in a long-term care we will partner with our nurse case manager  Pressure injury of skin No apparent skin issues now we will assess when he comes to the office  Urinary retention Patient urinating normally this has resolved  History of alcohol use disorder Patient currently not drinking alcohol active  Bradycardia Persistent bradycardia will not give metoprolol  Elevated LFTs This has resolved  Urinary incontinence Patient is diaper dependent  History of cocaine use Not currently active   Zachary Flores was seen today for establish care.  Diagnoses and all orders for this visit:  Essential hypertension  Aortic atherosclerosis (HCC)  Slow transit constipation  Hemiparesis affecting left side as late effect of stroke (HCC)  Pressure injury of buttock, stage 2, unspecified laterality (HCC)  Urinary retention  History of alcohol use disorder  Bradycardia  Elevated LFTs  Urinary incontinence, unspecified type  History of cocaine use  Other orders -     atorvastatin (LIPITOR) 20 MG tablet; Take 1 tablet (20 mg total) by mouth daily. -     valsartan (DIOVAN) 80 MG tablet; Take 1 tablet (80 mg total) by mouth daily.   Stiff /rigid,  Needs PT .   Decreased tone left arm     [facility]  [address]           Review of Systems  HENT: Negative.    Respiratory: Negative.   Cardiovascular: Negative.   Gastrointestinal: Negative.   Musculoskeletal: Positive for gait problem.  Neurological: Positive for weakness.  Psychiatric/Behavioral: Negative.        Objective:   Physical Exam no exam this is a phone visit   CT Abd 12/2020  IMPRESSION: 1. No acute findings in the abdomen/pelvis. 2. Subcentimeter upper pole left renal cortical hypodensity too small to characterize, but likely a cyst. 3. Aortic atherosclerosis.  Aortic Atherosclerosis (ICD10-I70.0).    Assessment & Plan:  I personally reviewed all images and lab data in the [facility] system as well as any outside material available during this office visit and agree with the  radiology impressions.   No problem-specific Assessment & Plan notes found for this encounter.   There are no diagnoses linked to this encounter.

## 2021-03-01 NOTE — Telephone Encounter (Signed)
I connected by phone with Jonelle Sidle and/or patient's caregiver on 03/01/2021 at 1:21 PM to discuss the potential vaccination through our Homebound vaccination initiative.   Prevaccination Checklist for COVID-19 Vaccines  1.  Are you feeling sick today? no  2.  Have you ever received a dose of a COVID-19 vaccine?  yes      If yes, which one? Pfizer   How many dose of Covid-19 vaccine have your received and dates ? 1, 11/03/2020   Check all that apply: I live in a long-term care setting. no  I have been diagnosed with a medical condition(s). Please list: History of CVA (pertinent to homebound status)  I am a first responder. no  I work in a long-term care facility, correctional facility, hospital, restaurant, retail setting, school, or other setting with high exposure to the public. no  4. Do you have a health condition or are you undergoing treatment that makes you moderately or severely immunocompromised? (This would include treatment for cancer or HIV, receipt of organ transplant, immunosuppressive therapy or high-dose corticosteroids, CAR-T-cell therapy, hematopoietic cell transplant [HCT], DiGeorge syndrome or Wiskott-Aldrich syndrome)  no  5. Have you received hematopoietic cell transplant (HCT) or CAR-T-cell therapies since receiving COVID-19 vaccine? no  6.  Have you ever had an allergic reaction: (This would include a severe reaction [ e.g., anaphylaxis] that required treatment with epinephrine or EpiPen or that caused you to go to the hospital.  It would also include an allergic reaction that occurred within 4 hours that caused hives, swelling, or respiratory distress, including wheezing.) A.  A previous dose of COVID-19 vaccine. no  B.  A vaccine or injectable therapy that contains multiple components, one of which is a COVID-19 vaccine component, but it is not known which component elicited the immediate reaction. no  C.  Are you allergic to polyethylene glycol? no  D. Are you  allergic to Polysorbate, which is found in some vaccines, film coated tablets and intravenous steroids?  no   7.  Have you ever had an allergic reaction to another vaccine (other than COVID-19 vaccine) or an injectable medication? (This would include a severe reaction [ e.g., anaphylaxis] that required treatment with epinephrine or EpiPen or that caused you to go to the hospital.  It would also include an allergic reaction that occurred within 4 hours that caused hives, swelling, or respiratory distress, including wheezing.)  no   8.  Have you ever had a severe allergic reaction (e.g., anaphylaxis) to something other than a component of the COVID-19 vaccine, or any vaccine or injectable medication?  This would include food, pet, venom, environmental, or oral medication allergies.  no   Check all that apply to you:  Am a male between ages 58 and 80 years old  no  Women 60 through 57 years of age can receive any FDA-authorized or -approved COVID-19 vaccine. However, they should be informed of the rare but increased risk of thrombosis with thrombocytopenia syndrome (TTS) after receipt of the [employer] Vaccine and the availability of other FDA-authorized and -approved COVID-19 vaccines. People who had TTS after a first dose of Janssen vaccine should not receive a subsequent dose of Janssen product    Am a male between ages 88 and 33 years old  no Males 5 through 57 years of age may receive the correct formulation of Pfizer-BioNTech COVID-19 vaccine. Males 18 and older can receive any FDA-authorized or -approved vaccine. However, people receiving an mRNA COVID-19 vaccine, especially males  27 through 57 years of age and their parents/legal representative (when relevant), should be informed of the risk of developing myocarditis (an inflammation of the heart muscle) or pericarditis (inflammation of the lining around the heart) after receipt of an mRNA vaccine. The risk of developing either myocarditis or  pericarditis after vaccination is low, and lower than the risk of myocarditis associated with SARS-CoV-2 infection in adolescents and adults. Vaccine recipients should be counseled about the need to seek care if symptoms of myocarditis or pericarditis develop after vaccination     Have a history of myocarditis or pericarditis  no Myocarditis or pericarditis after receipt of the first dose of an mRNA COVID-19 vaccine series but before administration of the second dose  Experts advise that people who develop myocarditis or pericarditis after a dose of an mRNA COVID-19 vaccine not receive a subsequent dose of any COVID-19 vaccine, until additional safety data are available.  Administration of a subsequent dose of COVID-19 vaccine before safety data are available can be considered in certain circumstances after the episode of myocarditis or pericarditis has completely resolved. Until additional data are available, some experts recommend a [employer] COVID-19 vaccine be considered instead of an mRNA COVID-19 vaccine. Decisions about proceeding with a subsequent dose should include a conversation between the patient, their parent/legal representative (when relevant), and their clinical team, which may include a cardiologist.    Have been treated with monoclonal antibodies or convalescent serum to prevent or treat COVID-19  no Vaccination should be offered to people regardless of history of prior symptomatic or asymptomatic SARS-CoV-2 infection. There is no recommended minimal interval between infection and vaccination.  However, vaccination should be deferred if a patient received monoclonal antibodies or convalescent serum as treatment for COVID-19 or for post-exposure prophylaxis. This is a precautionary measure until additional information becomes available, to avoid interference of the antibody treatment with vaccine-induced immune responses.  Defer COVID-19 vaccination for 30 days when a passive antibody  product was used for post-exposure prophylaxis.  Defer COVID-19 vaccination for 90 days when a passive antibody product was used to treat COVID-19.     Diagnosed with Multisystem Inflammatory Syndrome (MIS-C or MIS-A) after a COVID-19 infection  no It is unknown if people with a history of MIS-C or MIS-A are at risk for a dysregulated immune response to COVID-19 vaccination.  People with a history of MIS-C or MIS-A may choose to be vaccinated. Considerations for vaccination may include:   Clinical recovery from MIS-C or MIS-A, including return to normal cardiac function   Personal risk of severe acute COVID-19 (e.g., age, underlying conditions)   High or substantial community transmission of SARS-CoV-2 and personal increased risk of reinfection.   Timing of any immunomodulatory therapies (general best practice guidelines for immunization can be consulted for more information [email or web address])   It has been 90 days or more since their diagnosis of MIS-C   Onset of MIS-C occurred before any COVID-19 vaccination   A conversation between the patient, their guardian(s), and their clinical team or a specialist may assist with COVID-19 vaccination decisions. Healthcare providers and health departments may also request a consultation from the [facility] at [email or web address].     Have a bleeding disorder  no Take a blood thinner  no As with all vaccines, any COVID-19 vaccine product may be given to these patients, if a physician familiar with the patient's bleeding risk determines that the vaccine can be administered intramuscularly with reasonable safety.  ACIP recommends  the following technique for intramuscular vaccination in patients with bleeding disorders or taking blood thinners: a fine-gauge needle (23-gauge or smaller caliber) should be used for the  vaccination, followed by firm pressure on the site, without rubbing, for at least 2 minutes.  People who regularly take aspirin or anticoagulants as part of their routine medications do not need to stop these medications prior to receipt of any COVID-19 vaccine.    Have a history of heparin-induced thrombocytopenia (HIT)  no Although the etiology of TTS associated with the [employer] COVID-19 vaccine is unclear, it appears to be similar to another rare immune-mediated syndrome, heparin-induced thrombocytopenia (HIT). People with a history of an episode of an immune-mediated syndrome characterized by thrombosis and thrombocytopenia, such as HIT, should be offered a currently FDA-approved or FDA-authorized mRNA COVID-19 vaccine if it has been ?90 days since their TTS resolved. After 90 days, patients may be vaccinated with any currently FDA-approved or FDA-authorized COVID-19 vaccine, including Janssen COVID-19 Vaccine. However, people who developed TTS after their initial [employer] vaccine should not receive a Janssen booster dose.  Experts believe the following factors do not make people more susceptible to TTS after receipt of the [employer]. People with these conditions can be vaccinated with any FDA-authorized or - approved COVID-19 vaccine, including the [employer] COVID-19 Vaccine:   A prior history of venous thromboembolism   Risk factors for venous thromboembolism (e.g., inherited or acquired thrombophilia including Factor V Leiden; prothrombin gene 20210A mutation; antiphospholipid syndrome; protein C, protein S or antithrombin deficiency   A prior history of other types of thromboses not associated with thrombocytopenia   Pregnancy, post-partum status, or receipt of hormonal contraceptives (e.g., combined oral contraceptives, patch, ring)   Additional recipient education materials can be found at [employer] vaccines/safety/JJUpdate.html.    Am currently pregnant  or breastfeeding  no Vaccination is recommended for all people aged 29 years and older, including people that are:   Pregnant   Breastfeeding   Trying to get pregnant now or who might become pregnant in the future   Pregnant, breastfeeding, and post-partum people 76 through 57 years of age should be aware of the rare risk of TTS after receipt of the [employer] COVID-19 Vaccine and the availability of other FDA-authorized or -approved COVID-19 vaccines (i.e., mRNA vaccines).    Have received dermal fillers  no FDA-authorized or -approved COVID-19 vaccines can be administered to people who have received injectable dermal fillers who have no contraindications for vaccination.  Infrequently, these people might experience temporary swelling at or near the site of filler injection (usually the face or lips) following administration of a dose of an mRNA COVID-19 vaccine. These people should be advised to contact their healthcare provider if swelling develops at or near the site of dermal filler following vaccination.     Have a history of Guillain-Barr Syndrome (GBS)  no People with a history of GBS can receive any FDA-authorized or -approved COVID-19 vaccine. However, given the possible association between the [employer] and an increased risk of GBS, a patient with a history of GBS and their clinical team should discuss the availability of mRNA vaccines to offer protection against COVID-19. The highest risk has been observed in men aged 50-64 years with symptoms of GBS beginning within 42 days after [employer] COVID-19 vaccination.  People who had GBS after receiving Janssen vaccine should be made aware of the option to receive an mRNA COVID-19 vaccine booster at least 2 months (8 weeks) after the [employer]  dose. However, [employer] vaccine may be used as a booster, particularly if GBS occurred more than 42 days after vaccination or was related to a non-vaccine factor. Prior to booster vaccination, a  conversation between the patient and their clinical team may assist with decisions about use of a COVID-19 booster dose, including the timing of administration     Postvaccination Observation Times for People without Contraindications to Covid 19 Vaccination.  30 minutes:  People with a history of: A contraindication to another type of COVID-19 vaccine product (i.e., mRNA or viral vector COVID-19 vaccines)   Immediate (within 4 hours of exposure) non-severe allergic reaction to a COVID-19 vaccine or injectable therapies   Anaphylaxis due to any cause   Immediate allergic reaction of any severity to a non-COVID-19 vaccine   15 minutes: All other people  This patient is a 55 y.o. male that meets the FDA criteria to receive homebound vaccination. Patient or parent/caregiver understands they have the option to accept or refuse homebound vaccination.  Patient passed the pre-screening checklist and would like to proceed with homebound vaccination.  Based on questionnaire above, I recommend the patient be observed for 15 minutes.  There are an estimated #0 other household members/caregivers who are also interested in receiving the vaccine.    The patient has been confirmed homebound and eligible for homebound vaccination with the considerations outlined above. I will send the patient's information to our scheduling team who will reach out to schedule the patient and potential caregiver/family members for homebound vaccination.    Skip Mayer 03/01/2021 1:21 PM

## 2021-03-01 NOTE — Progress Notes (Unsigned)
Established Patient Office Visit  Subjective:  Patient ID: Zachary Flores, male    DOB: 1966/02/29  Age: 55 y.o. MRN: [id number]  CC:  Chief Complaint  Patient presents with  . Follow-up    FL-2 form completion    HPI Zachary Flores is a 55 y.o M presenting today with his daughter and sister for a follow-up. The patient is evaluated to see if he can be placed in a long-term care rehab facility. 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  [name] lives with his daughter, who primarily takes care of him and her two children. The daughter has noticed decreased mobility and stiffness in the patient's movement for a month. The daughter states that the patient can stand up but not coordinate with movement with no incidence of falls reported. The patient can move when asked. The patient's daughter states that her dad benefited from rehab while hospitalized at [facility] and would like for him to resume treatment. The facility of choice for the patient is the [facility] on [address].   The patient is very emotional during the visit and states that he feels a lot of guilt. His daughter and sister reassured him of their love and support. The daughter says that the patient has improved and denies dysphagia, urinary incontinence, and constipation-the patient has quit drinking alcohol, smoking cocaine, and tobacco use. The daughter reports adherence to the patient's treatment regimen. The patient was started on valsartan at a previous telemedicine encounter and tolerated the medication well. The patient denies headaches and dizziness. The patient received his first COVID vaccine in [place] at the Rehab facility and has not received the second dose. At today's visit, [name] is due for his tetanus vaccination.  Past Medical History:  Diagnosis Date  . Hypertension   . Stroke  (HCC)   . Urinary retention     History reviewed. No pertinent surgical history.  Family History  Family history unknown: Yes    Social History   Socioeconomic History  . Marital status: Divorced    Spouse name: Not on file  . Number of children: Not on file  . Years of education: Not on file  . Highest education level: Not on file  Occupational History  . Not on file  Tobacco Use  . Smoking status: Former Games developer  . Smokeless tobacco: Never Used  Substance and Sexual Activity  . Alcohol use: Not Currently    Alcohol/week: 24.0 standard drinks    Types: 24 Cans of beer per week  . Drug use: Yes    Types: Cocaine    Comment: quit 08/2020  . Sexual activity: Not on file  Other Topics Concern  . Not on file  Social History Narrative   Lives with daughter   Right Handed   Drinks no caffeine   Social Determinants of Health   Financial Resource Strain: Not on file  Food Insecurity: Not on file  Transportation Needs: Not on file  Physical Activity: Not on file  Stress: Not on file  Social Connections: Not on file  Intimate Partner Violence: Not on file    Outpatient Medications Prior to Visit  Medication Sig Dispense Refill  . atorvastatin (LIPITOR) 20 MG tablet Take 1 tablet (20 mg total) by mouth daily. 90 tablet 3  . loperamide (IMODIUM) 2 MG capsule Take 1 capsule (2 mg total) by mouth every 4 (  four) hours as needed for diarrhea or loose stools. 30 capsule 0  . valsartan (DIOVAN) 80 MG tablet Take 1 tablet (80 mg total) by mouth daily. 90 tablet 1  . acetaminophen (TYLENOL) 325 MG tablet Take 2 tablets (650 mg total) by mouth every 4 (four) hours as needed for mild pain (temp >99.5 F). (Patient not taking: Reported on 03/01/2021)    . folic acid (FOLVITE) 1 MG tablet Take 1 tablet (1 mg total) by mouth daily. (Patient not taking: Reported on 03/01/2021)    . ibuprofen (ADVIL) 200 MG tablet Take 200 mg by mouth every 6 (six) hours as needed. (Patient not taking: Reported  on 03/01/2021)    . loratadine (CLARITIN) 10 MG tablet Take 1 tablet (10 mg total) by mouth daily. (Patient not taking: Reported on 03/01/2021)    . metoprolol succinate (TOPROL-XL) 25 MG 24 hr tablet Take 25 mg by mouth daily. (Patient not taking: Reported on 03/01/2021)    . Multiple Vitamin (MULTIVITAMIN WITH MINERALS) TABS tablet Take 1 tablet by mouth daily. (Patient not taking: Reported on 03/01/2021)     No facility-administered medications prior to visit.    No Known Allergies  ROS Review of Systems  Constitutional: Negative.  Negative for fever and unexpected weight change.  HENT: Negative for drooling and trouble swallowing.   Eyes: Negative.   Respiratory: Negative.   Cardiovascular: Negative.   Gastrointestinal: Negative.   Endocrine: Negative.   Genitourinary: Negative.   Musculoskeletal: Positive for gait problem. Negative for arthralgias and neck stiffness.  Skin: Negative.   Allergic/Immunologic: Negative.   Neurological: Positive for speech difficulty and weakness. Negative for facial asymmetry and numbness.  Psychiatric/Behavioral: Negative.       Objective:    Physical Exam HENT:     Head: Normocephalic.     Nose: Nose normal.     Mouth/Throat:     Mouth: Mucous membranes are moist.  Eyes:     Extraocular Movements: Extraocular movements intact.     Conjunctiva/sclera: Conjunctivae normal.     Comments: Cranial nerve 2,3,4 and 6 were assessed with no nystagmus, ptosis and lid lag.   Cardiovascular:     Rate and Rhythm: Normal rate.     Pulses: Normal pulses.  Pulmonary:     Effort: Pulmonary effort is normal.     Breath sounds: Normal breath sounds.  Abdominal:     General: Abdomen is flat.     Palpations: Abdomen is soft.  Musculoskeletal:        General: No swelling or tenderness.     Cervical back: Normal range of motion.     Comments: Muscle strength 5/5 right knee flexion,extension and right ankle dorsiflexion and plantar flexion. Muscle strenght  3/5 with knee flexion, extension and left ankle dorsiflexion and plantar flexion.  +2 right and left patellar reflex    Skin:    General: Skin is warm.  Neurological:     Mental Status: He is alert and oriented to person, place, and time.     Sensory: No sensory deficit.     Coordination: Coordination abnormal.     Gait: Gait abnormal.     Comments:  Slow, irregular and clumsy rapid finger tapping and rapid alternating movements in the left hand.  Unable to assess gait because patient is wheelchair bound.  impaired vibration sense in the left foot, normal in the right     Psychiatric:        Mood and Affect: Mood normal.  BP 126/87 (BP Location: Right Arm, Patient Position: Sitting, Cuff Size: Normal)   Pulse (!) 59   Temp 98.3 F (36.8 C) (Oral)   Resp 18   Ht 5\' 7"  (1.702 m) Comment: pt reported  SpO2 99%   BMI 25.06 kg/m  Wt Readings from Last 3 Encounters:  02/15/21 160 lb (72.6 kg)  10/08/20 172 lb 13.5 oz (78.4 kg)  09/13/20 171 lb 4.8 oz (77.7 kg)     Health Maintenance Due  Topic Date Due  . COVID-19 Vaccine (2 - Pfizer 3-dose series) 11/24/2020    There are no preventive care reminders to display for this patient.  No results found for: TSH Lab Results  Component Value Date   WBC 5.5 03/01/2021   HGB 15.3 03/01/2021   HCT 48.4 03/01/2021   MCV 88 03/01/2021   PLT 386 03/01/2021   Lab Results  Component Value Date   NA 140 03/01/2021   K 4.6 03/01/2021   CO2 24 03/01/2021   GLUCOSE 97 03/01/2021   BUN 9 03/01/2021   CREATININE 0.75 (L) 03/01/2021   BILITOT <0.2 03/01/2021   ALKPHOS 111 03/01/2021   AST 24 03/01/2021   ALT 54 (H) 03/01/2021   PROT 7.3 03/01/2021   ALBUMIN 4.3 03/01/2021   CALCIUM 9.8 03/01/2021   ANIONGAP 12 01/08/2021   Lab Results  Component Value Date   CHOL 111 03/01/2021   Lab Results  Component Value Date   HDL 47 03/01/2021   Lab Results  Component Value Date   LDLCALC 50 03/01/2021   Lab Results   Component Value Date   TRIG 66 03/01/2021   Lab Results  Component Value Date   CHOLHDL 2.4 03/01/2021   Lab Results  Component Value Date   HGBA1C 5.9 (H) 09/08/2020      Assessment & Plan:  Essential hypertension Controlled Blood pressure today is 126/87 Continue taking Valsartan 1 tablet by mouth daily Comprehensive metabolic panel- future CBC with Differential/Platele-future Lipid panel- future  Dysphagia, post-stroke Resolved Pt swallows liquids and solids with no difficulty  Left-sided weakness Improved The patient is able to stand with impaired gait coordination.  Muscle strength 3/5 with left knee flexion, extension, ankle dorsiflexion and plantar flexion.   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 The daughter would like the patient to be placed at a rehab long-term care facility. The facility of choice is [facility] on [address].  The patient and family spoke with our nurse case manager regarding rehab facility and placement. Comprehensive metabolic panel-future CBC with Differential/Platele-future Lipid panel-future  Aortic atherosclerosis (HCC) Comprehensive metabolic panel CBC with Differential/Platelet Lipid panel  Need for tetanus, diphtheria, and acellular pertussis (Tdap) vaccine -     Tdap vaccine greater than or equal to 7yo IM  Problem List Items Addressed This Visit      Cardiovascular and Mediastinum   Essential hypertension    Controlled Blood pressure today is 126/87 Continue taking Valsartan 1 tablet by mouth daily Comprehensive metabolic panel- future CBC with Differential/Platele-future Lipid  panel- future      Relevant Orders   Comprehensive metabolic panel (Completed)   CBC with Differential/Platelet (Completed)   Lipid panel (Completed)   Aortic  atherosclerosis (HCC)    Comprehensive metabolic panel CBC with Differential/Platelet Lipid panel      Relevant Orders   Comprehensive metabolic panel (Completed)   CBC with Differential/Platelet (Completed)   Lipid panel (Completed)     Digestive   Dysphagia, post-stroke    Resolved Pt swallows liquids and solids with no difficulty        Nervous and Auditory   Hemorrhagic stroke (HCC)    History of left 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 The daughter would like the patient to be placed at a rehab long-term care facility. [facility] facility [facility] choice [facility] on [address].  The patient and family spoke with our nurse case manager regarding rehab facility and placement. Comprehensive metabolic panel-future CBC with Differential/Platele-future Lipid panel-future      Left-sided weakness    Improved The patient is able to stand with impaired gait coordination.  Muscle strength 3/5 with left knee flexion, extension, ankle dorsiflexion and plantar flexion.       Pontine hemorrhage (HCC) - Primary   Relevant Orders   Comprehensive metabolic panel (Completed)   CBC with Differential/Platelet (Completed)   Lipid panel (Completed)     Musculoskeletal and Integument   Pressure injury of skin    No apparent skin issues noted        Hematopoietic and Hemostatic   Thrombocytosis    Resolved Platelet 383 on 01/08/21       Other Visit Diagnoses    Need for tetanus, diphtheria, and acellular pertussis (Tdap) vaccine       Relevant Orders   Tdap vaccine greater than or equal to 7yo IM (Completed)      No orders of  the defined types were placed in this encounter.   Follow-up: Return in about 1 month (around 04/01/2021).    Shan LevansPatrick Wright, MD

## 2021-03-01 NOTE — Assessment & Plan Note (Addendum)
Resolved Platelet 383 on 01/08/21

## 2021-03-02 ENCOUNTER — Encounter: Payer: Self-pay | Admitting: Critical Care Medicine

## 2021-03-02 ENCOUNTER — Telehealth: Payer: Self-pay | Admitting: Critical Care Medicine

## 2021-03-02 ENCOUNTER — Telehealth: Payer: Self-pay

## 2021-03-02 LAB — CBC WITH DIFFERENTIAL/PLATELET
Basophils Absolute: 0.1 10*3/uL (ref 0.0–0.2)
Basos: 1 %
EOS (ABSOLUTE): 0.1 10*3/uL (ref 0.0–0.4)
Eos: 2 %
Hematocrit: 48.4 % (ref 37.5–51.0)
Hemoglobin: 15.3 g/dL (ref 13.0–17.7)
Immature Grans (Abs): 0 10*3/uL (ref 0.0–0.1)
Immature Granulocytes: 0 %
Lymphocytes Absolute: 1.9 10*3/uL (ref 0.7–3.1)
Lymphs: 34 %
MCH: 27.8 pg (ref 26.6–33.0)
MCHC: 31.6 g/dL (ref 31.5–35.7)
MCV: 88 fL (ref 79–97)
Monocytes Absolute: 0.4 10*3/uL (ref 0.1–0.9)
Monocytes: 7 %
Neutrophils Absolute: 3 10*3/uL (ref 1.4–7.0)
Neutrophils: 56 %
Platelets: 386 10*3/uL (ref 150–450)
RBC: 5.5 x10E6/uL (ref 4.14–5.80)
RDW: 13 % (ref 11.6–15.4)
WBC: 5.5 10*3/uL (ref 3.4–10.8)

## 2021-03-02 LAB — COMPREHENSIVE METABOLIC PANEL
ALT: 54 IU/L — ABNORMAL HIGH (ref 0–44)
AST: 24 IU/L (ref 0–40)
Albumin/Globulin Ratio: 1.4 (ref 1.2–2.2)
Albumin: 4.3 g/dL (ref 3.8–4.9)
Alkaline Phosphatase: 111 IU/L (ref 44–121)
BUN/Creatinine Ratio: 12 (ref 9–20)
BUN: 9 mg/dL (ref 6–24)
Bilirubin Total: <0.2 mg/dL (ref 0.0–1.2)
CO2: 24 mmol/L (ref 20–29)
Calcium: 9.8 mg/dL (ref 8.7–10.2)
Chloride: 101 mmol/L (ref 96–106)
Creatinine, Ser: 0.75 mg/dL — ABNORMAL LOW (ref 0.76–1.27)
Globulin, Total: 3 g/dL (ref 1.5–4.5)
Glucose: 97 mg/dL (ref 65–99)
Potassium: 4.6 mmol/L (ref 3.5–5.2)
Sodium: 140 mmol/L (ref 134–144)
Total Protein: 7.3 g/dL (ref 6.0–8.5)
eGFR: 105 mL/min/{1.73_m2} (ref >59)

## 2021-03-02 LAB — LIPID PANEL
Chol/HDL Ratio: 2.4 ratio (ref 0.0–5.0)
Cholesterol, Total: 111 mg/dL (ref 100–199)
HDL: 47 mg/dL (ref >39)
LDL Chol Calc (NIH): 50 mg/dL (ref 0–99)
Triglycerides: 66 mg/dL (ref 0–149)
VLDL Cholesterol Cal: 14 mg/dL (ref 5–40)

## 2021-03-02 NOTE — Telephone Encounter (Signed)
Please let the patient's daughter know in order to let the patient know that his labs came back normal.  Is kidneys are normal and liver function is normal cholesterol is at goal blood counts normal

## 2021-03-02 NOTE — Telephone Encounter (Signed)
Left message with patient daughter per  DPR. Verbalized understanding.

## 2021-03-02 NOTE — Telephone Encounter (Signed)
Calls placed to the following facilities today to inquire about bed availability for rehab placement for patient:  [facility] # [phone], message left for Nikki/Admissions.  [facility] # [phone], spoke to [place] who said that they do not have any medicaid beds available.  [facility] # [phone] - message left for [facility] Admissions Coordinator.  [facility] # [phone] - message left for admissions director/ no name on voicemail.  [facility] # [phone] and was provided the contact information for Dimple Casey - cell # [phone]. Message left on Tracey's cell phone requesting a call back to this CM.   [facility] # [phone], spoke to Demetrus/Admissions who said that they do not have any beds available today but that can change day to day.  [facility] # [phone], message left for Zonia Kief D/Admissions Director  [facility] # [phone] left for Kitty/ Admissions Director.  [facility] # [phone], message left for Katie/Admissions Director  [facility] # [phone], spoke to Selena Batten, who requested FL2 and provider notes be faxed to her for review.   - fax # [phone]. Kim inquired about COVID vaccination status,  Informed her that the patient has received his first dose and the second dose is being arranged for administration at home,  Information then faxed as requested.     This CM then spoke to patient's daughter, Barnett Applebaum and updated on above noted attempts to find a bed for her father.  Informed her that the information is being sent to [facility] for review.  Also informed her that this CM spoke to Larene Pickett, LCSW and her father would not be eligible for admission to [facility]   Call placed to [facility] # [phone], message left for Teena/Admissions requesting a call back to this CM.  The message was left on Jessica/Medical Records voicemail.  Call placed to [facility]  # [phone], spoke to Soy/admissions who said that they do not have any beds available and they are not in network with [employer]   Call placed to [facility] # [phone], spoke to Sharee/Admissions who said that they do not accept any managed medicaid plans.

## 2021-03-03 ENCOUNTER — Other Ambulatory Visit: Payer: Self-pay | Admitting: Neurology

## 2021-03-03 ENCOUNTER — Ambulatory Visit: Payer: Medicaid Other | Attending: Critical Care Medicine

## 2021-03-03 DIAGNOSIS — Z23 Encounter for immunization: Secondary | ICD-10-CM

## 2021-03-03 DIAGNOSIS — R269 Unspecified abnormalities of gait and mobility: Secondary | ICD-10-CM

## 2021-03-03 NOTE — Progress Notes (Signed)
   Covid-19 Vaccination Clinic  Name:  Zachary Flores    MRN: [id number] DOB: 1966-10-18  03/03/2021  [name] was observed post Covid-19 immunization for 15 minutes without incident. He was provided with Vaccine Information Sheet and instruction to access the V-Safe system.   [name] was instructed to call 911 with any severe reactions post vaccine: Marland Kitchen Difficulty breathing  . Swelling of face and throat  . A fast heartbeat  . A bad rash all over body  . Dizziness and weakness   Immunizations Administered    Name Date Dose VIS Date Route   PFIZER Comrnaty(Gray TOP) Covid-19 Vaccine 03/03/2021 11:17 AM 0.3 mL 12/02/2020 Intramuscular   Manufacturer: [employer], [employer]   Lot: [id number]   NDC: [id number]

## 2021-03-03 NOTE — Telephone Encounter (Signed)
Call received from Kitty/[facility] stating that they do not accept managed medicaid plans

## 2021-03-03 NOTE — Telephone Encounter (Signed)
Ok I will do so 

## 2021-03-08 NOTE — Telephone Encounter (Addendum)
Call placed to Kim/[facility] admissions to check on status of FL2 review. Call placed on hold and Selena Batten never returned to answer  Call placed to patient's daughter, Barnett Applebaum and informed her that this CM is still waiting to hear a response from [employer].  Reviewed with her who has been contacted, who is not in network, who has said no.  Barnett Applebaum is in agreement to contacting facilities outside of [facility].  She was in agreement to this CM contacting [facility] as they may be accepting patients.  Provided her with the numbers for [facility] and [facility]. She said she will start making calls also to check bed availability.  This CM to keep her updated with the status of calls.   Call placed to [facility] # ,spoke to Reshema/covering for admissions.  She requested that the [facility] be faxed for review and FL2 was then faxed to # [phone].   Call placed to [facility] # [phone], message left for [place]/Admissions requesting information about bed availability. Call back requested to this CM.   Call placed to Kim/[facility] who said that they are not able to accept the referral.   Call placed to [facility], and informed her that [facility] is not able to accept her father.  She said she contacted [facility] and spoke to [facility] # [phone] and was informed that they have a bed but her father needs long term medicaid.  This CM called Elwanda Brooklyn / Director of Admissions - [facility] who explained that the patient needs to have started the application for special assistance medicaid and submit the [facility] and they will review the patient's needs at that time to determine if appropriate and if a bed is available.   Call placed to [facility] and explained conversation with Ms [facility].  She stated that she was on the phone with DSS inquiring about long term medicaid.  Informed her that [facility] would like to know the  name of the case manager at DSS that she is working with when the [facility] is submitted,.

## 2021-03-09 ENCOUNTER — Telehealth: Payer: Self-pay | Admitting: Critical Care Medicine

## 2021-03-09 ENCOUNTER — Ambulatory Visit: Payer: MEDICAID | Admitting: Diagnostic Neuroimaging

## 2021-03-09 NOTE — Telephone Encounter (Signed)
Reason for CRM: Bessy Locus, pts sister, called asking to speak with the person who organizes rehab services / nursing home. Pt had some questions related to her brothers care. Please advise.

## 2021-03-09 NOTE — Telephone Encounter (Signed)
Call received from [facility] stating that they are able to accept the patient. She inquired if he has received his second COVID vaccine.  This CM explained that the patient had been scheduled to receive it at home but confirmation needed that he has actually received it.  Informed her that the patient's daughter would be the best person to speak with and this CM would also like his daughter to have the opportunity to speak with her Zachary Flores) about the facility and any address any questions that she might have about the care for her father.  Reshema provided her cell # [phone].   Call placed to [facility], patient's daughter and informed her of above noted conversation and provided her with Reshema's phone number and she said she would call her this morning

## 2021-03-09 NOTE — Telephone Encounter (Signed)
Thank you  for update

## 2021-03-09 NOTE — Telephone Encounter (Signed)
Calls received from [facility] this morning.  Patient's daughter was at the facility. Conference call with daughter, Wess Botts and facility business office representative. They explained to [facility] the process for admission, converting his managed medicaid to long term medicaid.  They told [name] that she did not have to do anything with DSS at this time, they ( facility) would initiate transition of medicaid at the required time.     Call placed to [facility] this evening to inquire about the outcome of the visit to [facility]. She explained that the facility will need to get authorization from the insurance company for the patient to receive therapy.  [name] said that she just received a message from [facility] stating that they have requested authorization for admission from the insurance company and they will let her know as soon as they receive a response. [name] said she was pleased with the facility - the building, staff and appearance of the residents.  She would like her father to get more than 2 weeks of therapy but she realizes that he also needs more care than she can provide for him, so long term may be an option.

## 2021-03-10 ENCOUNTER — Telehealth: Payer: Self-pay

## 2021-03-10 ENCOUNTER — Telehealth: Payer: Self-pay | Admitting: Critical Care Medicine

## 2021-03-10 NOTE — Telephone Encounter (Signed)
Copied from CRM [id number]. Topic: General - Other >> Mar 08, 2021  4:42 PM Marylen Ponto wrote: Reason for CRM: Pt daughter Duncan Dull requests to speak with case manager Erskine Squibb. Pt daughter would like to know if it is possible for her to get a copy of the FL2 form. Cb# [phone]

## 2021-03-10 NOTE — Telephone Encounter (Signed)
This CM spoke to [facility] multiple times this morning.  They received approval from his insurance company for therapy and care up to 90 days. She inquired about a PASRR. Informed her that this clinic has not obtained PASRR numbers in the past. She then noted that they will obtain that.  They have the FL2 and she obtained patient's social security number from his daughter.  They are getting his room ready and are planning to admit him today.

## 2021-03-15 ENCOUNTER — Telehealth: Payer: Self-pay

## 2021-03-15 NOTE — Telephone Encounter (Signed)
This CM spoke to [place], Admissions/[place] [place].  She said that the patient is adjusting well to the facility and has started to receive therapy.

## 2021-03-16 ENCOUNTER — Telehealth: Payer: Self-pay | Admitting: Neurology

## 2021-03-16 NOTE — Telephone Encounter (Signed)
Spoke to patient's sister and patient is in [facility] . Patient sister is wanting him to live independent   . Per her  request  I relayed to her that I could hold the referral.   I told her that she would have to check with [facility] and see what they could help her with and reach out to his insurance and social services . Patient's sister's is going to call me back with  where to send referral .

## 2021-03-16 NOTE — Telephone Encounter (Signed)
Thanks! Agree with your plan!

## 2021-04-06 ENCOUNTER — Encounter: Payer: Self-pay | Admitting: Critical Care Medicine

## 2021-04-06 ENCOUNTER — Ambulatory Visit: Payer: Medicaid Other | Attending: Critical Care Medicine | Admitting: Critical Care Medicine

## 2021-04-06 ENCOUNTER — Other Ambulatory Visit: Payer: Self-pay

## 2021-04-06 DIAGNOSIS — I69354 Hemiplegia and hemiparesis following cerebral infarction affecting left non-dominant side: Secondary | ICD-10-CM | POA: Diagnosis not present

## 2021-04-06 DIAGNOSIS — I6350 Cerebral infarction due to unspecified occlusion or stenosis of unspecified cerebral artery: Secondary | ICD-10-CM | POA: Diagnosis not present

## 2021-04-06 DIAGNOSIS — I700 Atherosclerosis of aorta: Secondary | ICD-10-CM | POA: Diagnosis not present

## 2021-04-06 DIAGNOSIS — I10 Essential (primary) hypertension: Secondary | ICD-10-CM | POA: Diagnosis not present

## 2021-04-06 NOTE — Assessment & Plan Note (Signed)
Blood pressure at goal no change in medication 

## 2021-04-06 NOTE — Progress Notes (Signed)
Subjective:    Patient ID: Zachary Flores, male    DOB: 06/27/1966, 55 y.o.   MRN: [id number]  03/01/21 Carel Carrier is a 55 y.o M presenting today with his daughter and sister for a follow-up. The patient is evaluated to see if he can be placed in a long-term care rehab facility. 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  Mr. Montoya lives with his daughter, who primarily takes care of him and her two children. The daughter has noticed decreased mobility and stiffness in the patient's movement for a month. The daughter states that the patient can stand up but not coordinate with movement with no incidence of falls reported. The patient can move when asked. The patient's daughter states that her dad benefited from rehab while hospitalized at [facility] and would like for him to resume treatment. The facility of choice for the patient is the [facility] on [address].   The patient is very emotional during the visit and states that he feels a lot of guilt. His daughter and sister reassured him of their love and support. The daughter says that the patient has improved and denies dysphagia, urinary incontinence, and constipation-the patient has quit drinking alcohol, smoking cocaine, and tobacco use. The daughter reports adherence to the patient's treatment regimen. The patient was started on valsartan at a previous telemedicine encounter and tolerated the medication well. The patient denies headaches and dizziness. The patient received his first COVID vaccine in [place] at the Rehab facility and has not received the second dose. At today's visit, Mr. Carlberg is due for his tetanus vaccination.  04/06/2021 Patient seen return follow-up is brought in by the nursing home he is now at [facility].  He is here with a nursing home attendant.  He sitting in a wheelchair doing well having  little to no complaints at this time.  He is getting physical therapy in the nursing home his status is overall improved.  On arrival blood pressure is good at 109/68.   Past Medical History:  Diagnosis Date  . Elevated LFTs 06/30/2019  . Hypertension   . Pressure injury of skin 09/19/2020  . Stroke (HCC)   . Thrombocytosis   . Urinary retention      Family History  Family history unknown: Yes     Social History   Socioeconomic History  . Marital status: Divorced    Spouse name: Not on file  . Number of children: Not on file  . Years of education: Not on file  . Highest education level: Not on file  Occupational History  . Not on file  Tobacco Use  . Smoking status: Former Games developer  . Smokeless tobacco: Never Used  Substance and Sexual Activity  . Alcohol use: Not Currently    Alcohol/week: 24.0 standard drinks    Types: 24 Cans of beer per week  . Drug use: Yes    Types: Cocaine    Comment: quit 08/2020  . Sexual activity: Not on file  Other Topics Concern  . Not on file  Social History Narrative   Lives with daughter   Right Handed   Drinks no caffeine   Social Determinants of Health   Financial Resource Strain: Not on file  Food Insecurity: Not on file  Transportation Needs: Not on file  Physical Activity: Not on file  Stress: Not on file  Social Connections: Not on  file  Intimate Partner Violence: Not on file     No Known Allergies   Outpatient Medications Prior to Visit  Medication Sig Dispense Refill  . atorvastatin (LIPITOR) 20 MG tablet Take 1 tablet (20 mg total) by mouth daily. 90 tablet 3  . loperamide (IMODIUM) 2 MG capsule Take 1 capsule (2 mg total) by mouth every 4 (four) hours as needed for diarrhea or loose stools. 30 capsule 0  . valsartan (DIOVAN) 80 MG tablet Take 1 tablet (80 mg total) by mouth daily. 90 tablet 1   No facility-administered medications prior to visit.      Review of Systems  HENT: Negative.   Respiratory: Negative.    Cardiovascular: Negative.   Gastrointestinal: Negative.   Musculoskeletal: Positive for gait problem.  Neurological: Positive for weakness. Negative for headaches.  Psychiatric/Behavioral: Negative.        Objective:   Physical Exam Vitals:   04/06/21 1033  BP: 109/68  Pulse: 64  SpO2: 97%    Gen: Pleasant, well-nourished, in no distress,  normal affect  ENT: No lesions,  mouth clear,  oropharynx clear, no postnasal drip  Neck: No JVD, no TMG, no carotid bruits  Lungs: No use of accessory muscles, no dullness to percussion, clear without rales or rhonchi  Cardiovascular: RRR, heart sounds normal, no murmur or gallops, no peripheral edema  Abdomen: soft and NT, no HSM,  BS normal  Musculoskeletal: No deformities, no cyanosis or clubbing  Neuro: alert, left upper extremity still weak but improved  Skin: Warm, no lesions or rashes  No results found.        Assessment & Plan:  I personally reviewed all images and lab data in the [facility] system as well as any outside material available during this office visit and agree with the  radiology impressions.   Right pontine cerebrovascular accident [facility]) History of pontine stroke with hemorrhage with long-term effects following stroke  Continue therapy in the nursing home  Essential hypertension Blood pressure at goal no change in medication  Aortic atherosclerosis (HCC) Continue statin therapy  Hemiparesis affecting left side as late effect of stroke (HCC) Continue therapy in the nursing home   Laroy was seen today for follow-up.  Diagnoses and all orders for this visit:  Right pontine cerebrovascular accident [facility])  Essential hypertension  Aortic atherosclerosis (HCC)  Hemiparesis affecting left side as late effect of stroke (HCC)

## 2021-04-06 NOTE — Patient Instructions (Signed)
No change in any of your medications  Continue your physical therapy in the facility  Return to see Dr. Delford Field 3 months

## 2021-04-06 NOTE — Assessment & Plan Note (Signed)
Continue statin therapy.

## 2021-04-06 NOTE — Assessment & Plan Note (Signed)
History of pontine stroke with hemorrhage with long-term effects following stroke  Continue therapy in the nursing home

## 2021-04-06 NOTE — Assessment & Plan Note (Signed)
Continue therapy in the nursing home

## 2021-05-17 ENCOUNTER — Ambulatory Visit (INDEPENDENT_AMBULATORY_CARE_PROVIDER_SITE_OTHER): Payer: Medicaid Other | Admitting: Adult Health

## 2021-05-17 ENCOUNTER — Other Ambulatory Visit: Payer: Self-pay

## 2021-05-17 ENCOUNTER — Encounter: Payer: Self-pay | Admitting: Adult Health

## 2021-05-17 VITALS — BP 128/83 | HR 62 | Ht 67.0 in

## 2021-05-17 DIAGNOSIS — I10 Essential (primary) hypertension: Secondary | ICD-10-CM

## 2021-05-17 DIAGNOSIS — G8114 Spastic hemiplegia affecting left nondominant side: Secondary | ICD-10-CM

## 2021-05-17 DIAGNOSIS — F1411 Cocaine abuse, in remission: Secondary | ICD-10-CM

## 2021-05-17 DIAGNOSIS — I613 Nontraumatic intracerebral hemorrhage in brain stem: Secondary | ICD-10-CM | POA: Diagnosis not present

## 2021-05-17 DIAGNOSIS — E785 Hyperlipidemia, unspecified: Secondary | ICD-10-CM

## 2021-05-17 MED ORDER — BACLOFEN 5 MG PO TABS: 5.0000 mg | ORAL_TABLET | Freq: Three times a day (TID) | ORAL | 5 refills | Status: AC

## 2021-05-17 MED ORDER — BACLOFEN 5 MG PO TABS: 5.0000 mg | ORAL_TABLET | Freq: Three times a day (TID) | ORAL | 5 refills | Status: DC

## 2021-05-17 NOTE — Progress Notes (Signed)
I agree with the above plan 

## 2021-05-17 NOTE — Progress Notes (Signed)
[facility] [address] [place]. [place] [address] [phone]       OFFICE FOLLOW-UP NOTE  Zachary Flores Date of Birth:  1966/09/09 Medical Record Number:  [id number]    Reason for visit: Stroke follow-up    Chief Complaint  Patient presents with  . Follow-up    RM 14 with daughter  Zachary Flores) Pt is having L side weakness and speech difficulty      HPI:   Today, 05/17/2021, Zachary Flores returns for 67-month stroke follow-up accompanied by his daughter  He has since been residing at [facility] due to requiring extensive assistance with care at home and strain on family.  Per daughter, he has been making excellent progress since going to SNF currently receiving PT/OT/SLP with residual left-sided weakness and speech difficulty.  He does report occasional left-sided pains/tightness especially when working with therapies.  He remains nonambulatory but is able to stand/pivot to transfer into wheelchair.  Denies new stroke/TIA symptoms  Remains on atorvastatin without associated side effects Blood pressure today 128/83 on losartan tolerating without side effects Continues to refrain from polysubstance abuse including EtOH, tobacco and cocaine  No further concerns at this time    History provided for reference purposes only Initial visit 02/13/2021 Dr. Pearlean Flores Zachary Flores is a 55 year old African-American male seen today for initial office follow-up visit following hospital admission for intracerebral hemorrhage in September 2021.  Is accompanied by sister.  History is obtained from them and review of electronic medical records and I personally reviewed pertinent imaging films in PACS.  He has past medical history of hypertension and cocaine and alcohol abuse who presented on 09/06/2020 to [facility] with sudden onset of left-sided weakness which he noticed when he woke up that morning.  He also had some double vision which worsened slightly when  he presented.  His blood pressure only mildly elevated 150/99 but CT scan showed a large Large 21 x 14 mm upper brainstem hematoma involving the pons with volume of 2.8 mL.  There was severe chronic small vessel disease changes noted.  CT angiogram of the brain and neck both did not show significant large vessel stenosis or occlusion.  Patient was kept in the ICU and blood pressure tightly monitored.  He remained neurologically stable and follow-up CT scan showed stable appearance of the hemorrhage.  His echocardiogram showed ejection fraction 55 to 60%.  LDL cholesterol was 100 mg percent and hemoglobin A1c was 5.9.  Urine drug screen was positive for cocaine.  Patient was transferred to inpatient rehab where he stayed for 4 weeks and then was eventually transferred to skilled nursing facility in [place] area.  Patient has shown some improvement and is able to speak and swallow is currently living at home with his daughter but is still unable to get up and walk even with assistance and is mostly wheelchair-bound.  He is currently not getting any home physical therapy and family is wondering if he will benefit from outpatient therapy.  His blood pressures well controlled and today it is 124/78.  He has quit drinking alcohol, smoking cigarettes and using cocaine since his hemorrhage.  Patient wants to eventually learn how to walk and live in an independent apartment.  He has no new complaints today.   ROS:   14 system review of systems is positive for those listed in HPI all other systems negative  PMH:  Past Medical History:  Diagnosis Date  . Elevated LFTs 06/30/2019  . Hypertension   .  Pressure injury of skin 09/19/2020  . Stroke (HCC)   . Thrombocytosis   . Urinary retention     Social History:  Social History   Socioeconomic History  . Marital status: Divorced    Spouse name: Not on file  . Number of children: Not on file  . Years of education: Not on file  . Highest education level: Not  on file  Occupational History  . Not on file  Tobacco Use  . Smoking status: Former Games developer  . Smokeless tobacco: Never Used  Substance and Sexual Activity  . Alcohol use: Not Currently    Alcohol/week: 24.0 standard drinks    Types: 24 Cans of beer per week  . Drug use: Yes    Types: Cocaine    Comment: quit 08/2020  . Sexual activity: Not on file  Other Topics Concern  . Not on file  Social History Narrative   Lives with daughter   Right Handed   Drinks no caffeine   Social Determinants of Health   Financial Resource Strain: Not on file  Food Insecurity: Not on file  Transportation Needs: Not on file  Physical Activity: Not on file  Stress: Not on file  Social Connections: Not on file  Intimate Partner Violence: Not on file    Medications:   Current Outpatient Medications on File Prior to Visit  Medication Sig Dispense Refill  . acetaminophen (TYLENOL) 325 MG tablet Take 650 mg by mouth every 6 (six) hours as needed.    Marland Kitchen atorvastatin (LIPITOR) 20 MG tablet Take 1 tablet (20 mg total) by mouth daily. 90 tablet 3  . loperamide (IMODIUM) 2 MG capsule Take 1 capsule (2 mg total) by mouth every 4 (four) hours as needed for diarrhea or loose stools. 30 capsule 0  . losartan (COZAAR) 50 MG tablet Take 1 tablet by mouth daily.    . magnesium hydroxide (MILK OF MAGNESIA) 400 MG/5ML suspension Take by mouth daily as needed for mild constipation.    . valsartan (DIOVAN) 80 MG tablet Take 1 tablet (80 mg total) by mouth daily. (Patient not taking: Reported on 05/17/2021) 90 tablet 1   No current facility-administered medications on file prior to visit.    Allergies:  No Known Allergies  Physical Exam Today's Vitals   05/17/21 0958  BP: 128/83  Pulse: 62  Height: 5\' 7"  (1.702 m)   Body mass index is 25.06 kg/m.    General: well developed, well nourished middle-aged African-American male, seated, in no evident distress Head: head normocephalic and atraumatic.  Neck:  supple with no carotid or supraclavicular bruits Cardiovascular: regular rate and rhythm, no murmurs Musculoskeletal: no deformity Skin:  no rash/petichiae Vascular:  Normal pulses all extremities  Neurologic Exam Mental Status: Awake and fully alert. Oriented to place and time. Recent and remote memory intact. Attention span, concentration and fund of knowledge appropriate. Mood and affect appropriate.  Mild dysarthria.  No aphasia. Cranial Nerves: Pupils equal, briskly reactive to light. Extraocular movements full without nystagmus. Visual fields full to confrontation. Hearing intact.  Left lower facial weakness.  Facial sensation intact. Face, tongue, palate moves normally and symmetrically.  Motor: Full strength right upper and lower extremity LUE:4-4+/5 with increased tone throughout LLE: 4+-5-/5 greater distally with mildly increased tone Sensory.: intact to touch ,pinprick .position and vibratory sensation.  Coordination: Impaired finger-to-nose and heel-to-shin coordination on the left and intact on the right. Gait and Station: patient nonambulatory Reflexes: 2+ and asymmetric and brisker on the left.  Toes downgoing.       ASSESSMENT: 54 year old African-American male with pontine hemorrhage in September 2021 secondary to hypertensive and cocaine abuse.       PLAN:  1.  Pontine hemorrhage -Residual significant residual left spastic hemiparesis and speech difficulty -since been transferred to [facility] and recommend continue PT/OT/SLP making significant progress since prior visit.  Recommend trialing baclofen 5 mg 3 times daily for spasticity interfering with sleep and rehab -Continue atorvastatin 20 mg daily for secondary stroke prevention -Discussed secondary stroke prevention measures and importance of close PCP follow-up for aggressive stroke risk factor management including HLD with LDL goal<70 and HTN with BP goal<130/90  2.  Polysubstance abuse -Endorses  continued abstinence   Follow-up in 4 months or call earlier if needed   CC:  [facility] provider: Dr. Randalyn Rhea, Charlcie Cradle, MD     Ihor Austin, [facility]  [facility] [address] [place], [place] [address]  Phone [phone] Fax [phone] Note: This document was prepared with digital dictation and possible smart phrase technology. Any transcriptional errors that result from this process are unintentional.

## 2021-05-17 NOTE — Patient Instructions (Signed)
Recommend starting baclofen 5mg  three times daily for spasticity   Continue working with therapies for likely further recovery   Continue atorvastatin  for secondary stroke prevention  Continue to follow up with PCP regarding cholesterol and blood pressure management  Maintain strict control of hypertension with blood pressure goal below 130/90 and cholesterol with LDL cholesterol (bad cholesterol) goal below 70 mg/dL.       Followup in the future with me in 4 months or call earlier if needed       Thank you for coming to see at [facility]. I hope we have been able to provide you high quality care today.  You may receive a patient satisfaction survey over the next few weeks. We would appreciate your feedback and comments so that we may continue to improve ourselves and the health of our patients.

## 2021-07-06 ENCOUNTER — Ambulatory Visit: Payer: Medicaid Other | Admitting: Critical Care Medicine

## 2021-07-06 NOTE — Progress Notes (Deleted)
Subjective:    Patient ID: Zachary Flores, male    DOB: August 27, 1966, 55 y.o.   MRN: [id number]  03/01/21 Zachary Flores is a 55 y.o M presenting today with his daughter and sister for a follow-up. The patient is evaluated to see if he can be placed in a long-term care rehab facility. 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  [name] lives with his daughter, who primarily takes care of him and her two children. The daughter has noticed decreased mobility and stiffness in the patient's movement for a month. The daughter states that the patient can stand up but not coordinate with movement with no incidence of falls reported. The patient can move when asked. The patient's daughter states that her dad benefited from rehab while hospitalized at [facility]. The facility of choice for the patient is the [facility].    The patient is very emotional during the visit and states that he feels a lot of guilt. His daughter and sister reassured him of their love and support. The daughter says that the patient has improved and denies dysphagia, urinary incontinence, and constipation-the patient has quit drinking alcohol, smoking cocaine, and tobacco use. The daughter reports adherence to the patient's Flores regimen. The patient was started on valsartan at a previous telemedicine encounter and tolerated the medication well. The patient denies headaches and dizziness. The patient received his first COVID vaccine in [place] at the Rehab facility and has not received the second dose. At today's visit, [name] is due for his tetanus vaccination.  04/06/2021 Patient seen return follow-up is brought in by the nursing home he is now at [facility].  He is here with a nursing home attendant.  He sitting in a wheelchair doing well having  little to no complaints at this time.  He is getting physical therapy in the nursing home his status is overall improved.  On arrival blood pressure is good at 109/68.  07/06/21  Right pontine cerebrovascular accident [facility]) History of pontine stroke with hemorrhage with long-term effects following stroke  Continue therapy in the nursing home  Essential hypertension Blood pressure at goal no change in medication  Aortic atherosclerosis (HCC) Continue statin therapy  Hemiparesis affecting left side as late effect of stroke [facility]) Continue therapy in the nursing home   Zachary Flores was seen today for follow-up.   Past Medical History:  Diagnosis Date   Elevated LFTs 06/30/2019   Hypertension    Pressure injury of skin 09/19/2020   Stroke (HCC)    Thrombocytosis    Urinary retention      Family History  Family history unknown: Yes     Social History   Socioeconomic History   Marital status: Divorced    Spouse name: Not on file   Number of children: Not on file   Years of education: Not on file   Highest education level: Not on file  Occupational History   Not on file  Tobacco Use   Smoking status: Former    Pack years: 0.00   Smokeless tobacco: Never  Substance and Sexual Activity   Alcohol use: Not Currently    Alcohol/week: 24.0 standard drinks    Types: 24 Cans of beer per week   Drug use: Yes    Types: Cocaine    Comment: quit 08/2020  Sexual activity: Not on file  Other Topics Concern   Not on file  Social History Narrative   Lives with daughter   Right Handed   Drinks no caffeine   Social Determinants of Health   Financial Resource Strain: Not on file  Food Insecurity: Not on file  Transportation Needs: Not on file  Physical Activity: Not on file  Stress: Not on file  Social Connections: Not on file  Intimate Partner Violence: Not on file     No Known Allergies   Outpatient Medications Prior to Visit  Medication Sig Dispense Refill    acetaminophen (TYLENOL) 325 MG tablet Take 650 mg by mouth every 6 (six) hours as needed.     atorvastatin (LIPITOR) 20 MG tablet Take 1 tablet (20 mg total) by mouth daily. 90 tablet 3   Baclofen 5 MG TABS Take 5 mg by mouth in the morning, at noon, and at bedtime. 90 tablet 5   loperamide (IMODIUM) 2 MG capsule Take 1 capsule (2 mg total) by mouth every 4 (four) hours as needed for diarrhea or loose stools. 30 capsule 0   losartan (COZAAR) 50 MG tablet Take 1 tablet by mouth daily.     magnesium hydroxide (MILK OF MAGNESIA) 400 MG/5ML suspension Take by mouth daily as needed for mild constipation.     valsartan (DIOVAN) 80 MG tablet Take 1 tablet (80 mg total) by mouth daily. (Patient not taking: Reported on 05/17/2021) 90 tablet 1   No facility-administered medications prior to visit.      Review of Systems  HENT: Negative.    Respiratory: Negative.    Cardiovascular: Negative.   Gastrointestinal: Negative.   Musculoskeletal:  Positive for gait problem.  Neurological:  Positive for weakness. Negative for headaches.  Psychiatric/Behavioral: Negative.        Objective:   Physical Exam There were no vitals filed for this visit.   Gen: Pleasant, well-nourished, in no distress,  normal affect  ENT: No lesions,  mouth clear,  oropharynx clear, no postnasal drip  Neck: No JVD, no TMG, no carotid bruits  Lungs: No use of accessory muscles, no dullness to percussion, clear without rales or rhonchi  Cardiovascular: RRR, heart sounds normal, no murmur or gallops, no peripheral edema  Abdomen: soft and NT, no HSM,  BS normal  Musculoskeletal: No deformities, no cyanosis or clubbing  Neuro: alert, left upper extremity still weak but improved  Skin: Warm, no lesions or rashes  No results found.        Assessment & Plan:  I personally reviewed all images and lab data in the [facility] system as well as any outside material available during this office visit and agree with the   radiology impressions.   No problem-specific Assessment & Plan notes found for this encounter.   There are no diagnoses linked to this encounter.

## 2021-09-10 IMAGING — CT CT ABD-PELV W/ CM
2 of 5 series · 15 of 46 positions shown, 17 images · IV contrast (Omni 300)
Comparison: None.

CLINICAL DATA: Possible diverticulitis or bowel obstruction.
Abdominal pain and constipation 3 days.

EXAM:
CT ABDOMEN AND PELVIS WITH CONTRAST
TECHNIQUE: Multidetector CT imaging of the abdomen and pelvis was performed
using the standard protocol following bolus administration of
intravenous contrast.
CONTRAST:  100mL OMNIPAQUE IOHEXOL 300 MG/ML  SOLN

[Series 5: a/p w/ 5mm · axial · 0.78mm/px · z∈[-414,+22]mm · 12 of 99 slices shown, 14 images]
[im 6/99  soft-tissue]
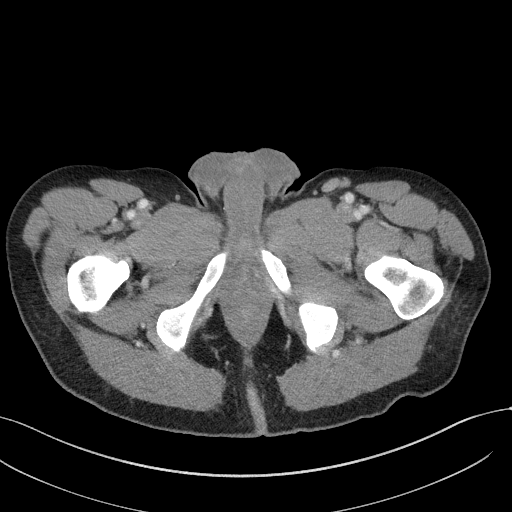
[im 6/99  bone]
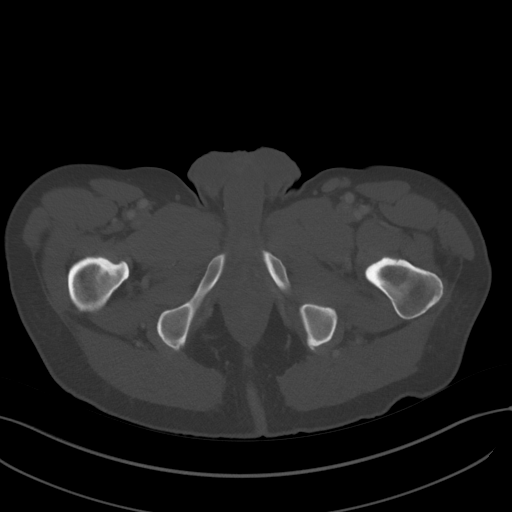
[im 16/99  soft-tissue]
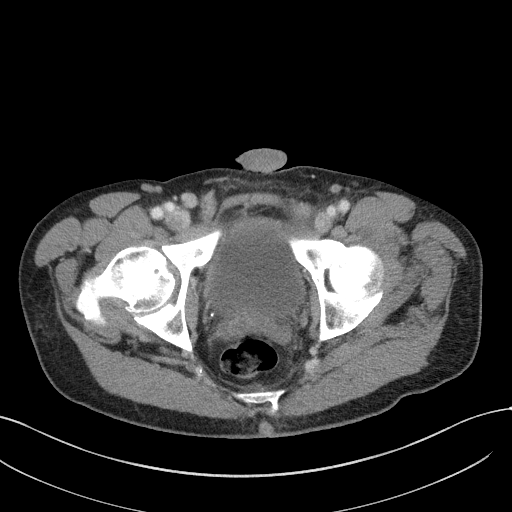
[im 21/99  soft-tissue]
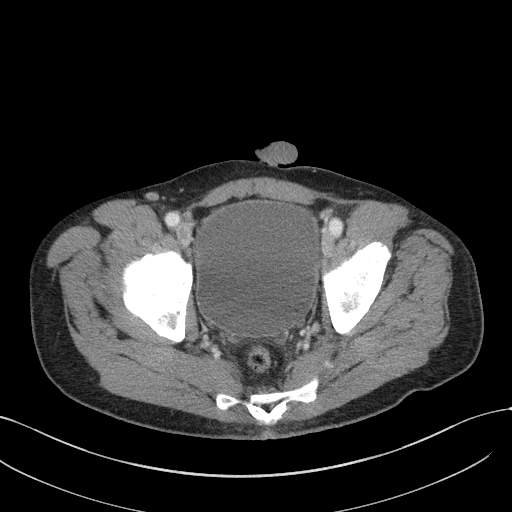
[im 31/99  soft-tissue]
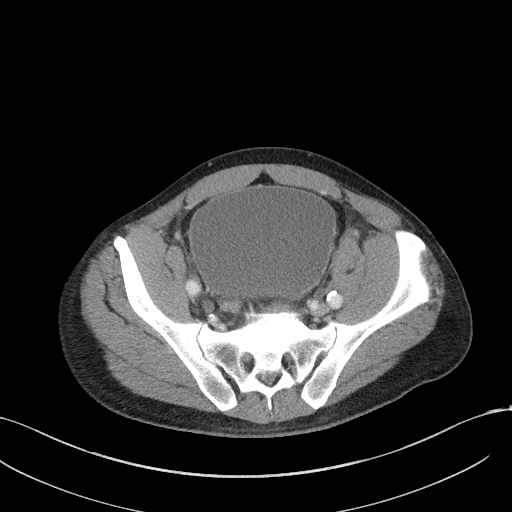
[im 37/99  soft-tissue]
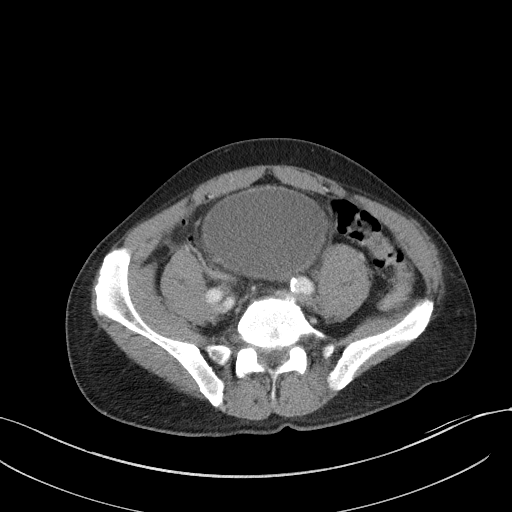
[im 47/99  soft-tissue]
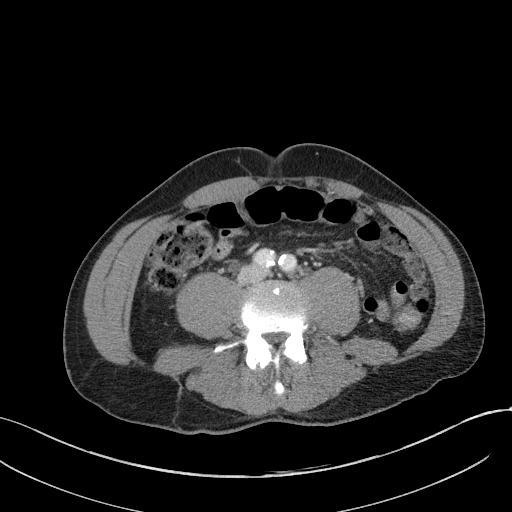
[im 52/99  soft-tissue]
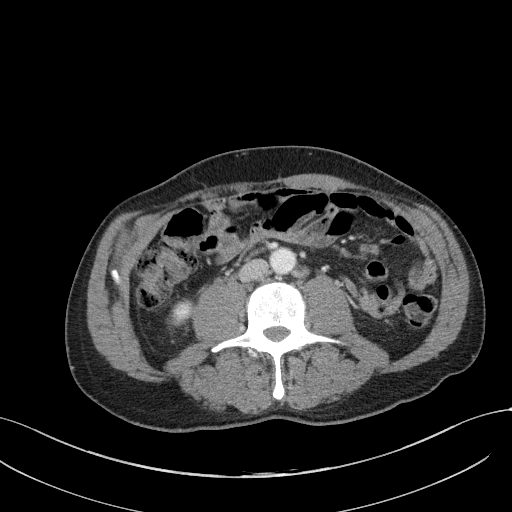
[im 62/99  soft-tissue]
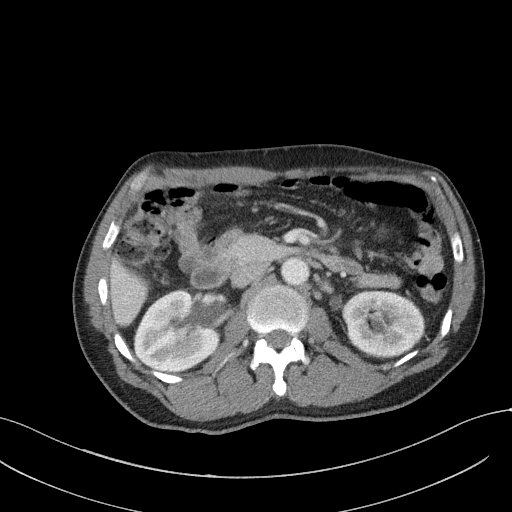
[im 68/99  soft-tissue]
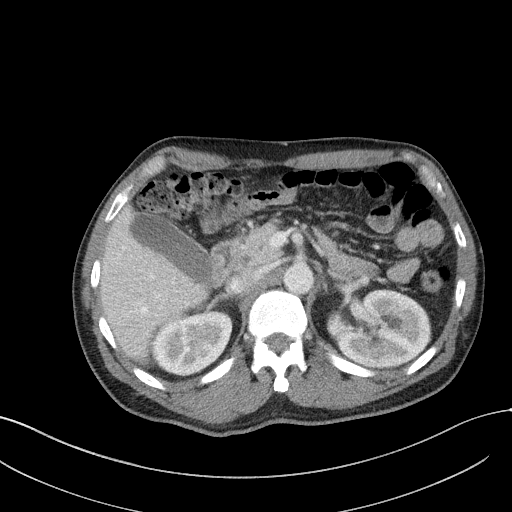
[im 68/99  bone]
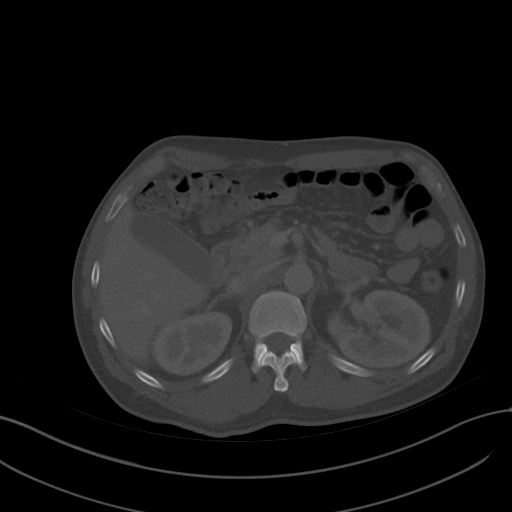
[im 78/99  soft-tissue]
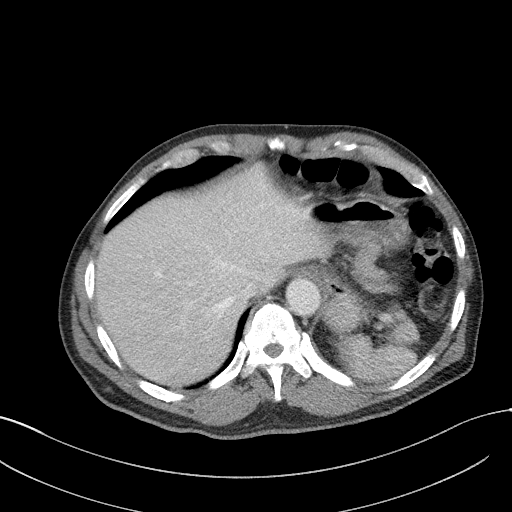
[im 83/99  soft-tissue]
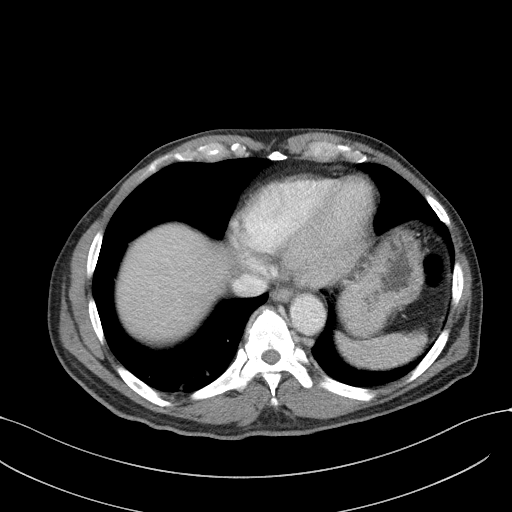
[im 93/99  soft-tissue]
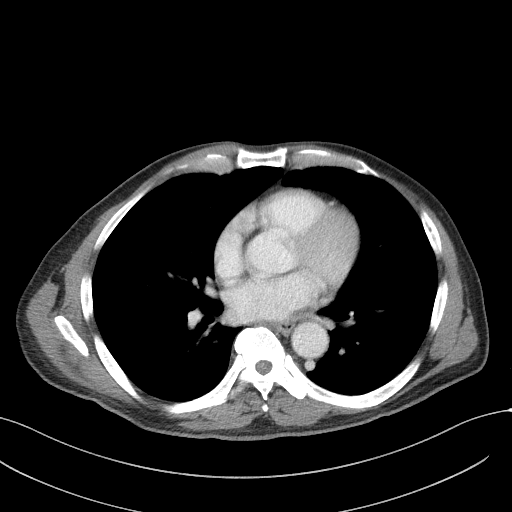

[Series 8: a/p w/ cor · coronal · 0.67mm/px · 3 of 150 slices shown]
[im 50/150  soft-tissue]
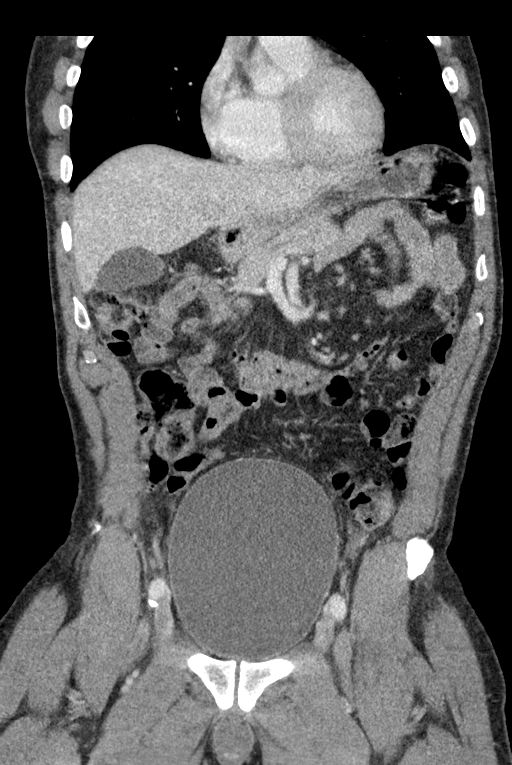
[im 67/150  soft-tissue]
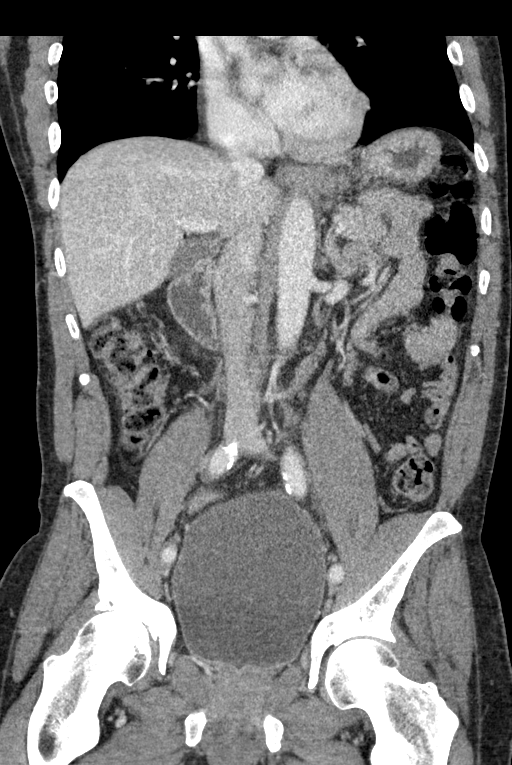
[im 83/150  soft-tissue]
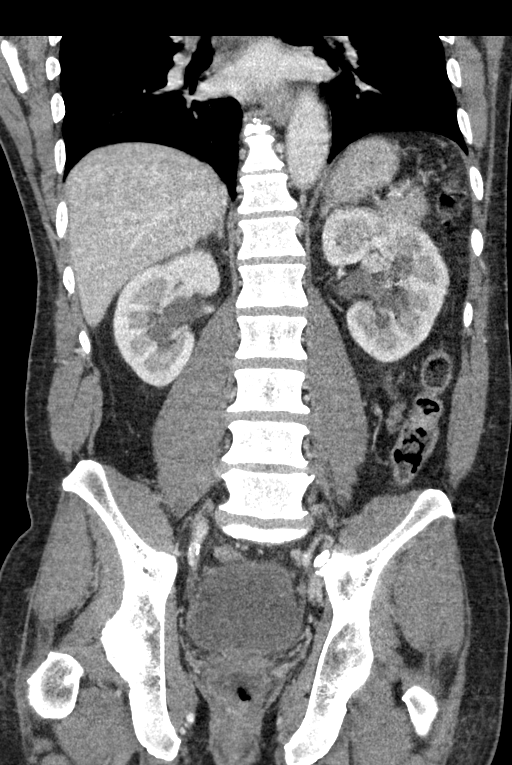

[15 of 46 positions shown; findings below may reference images not displayed]

FINDINGS: Lower chest: Lung bases are within normal.

Hepatobiliary: Liver, gallbladder and biliary tree are normal.

Pancreas: Normal.

Spleen: Normal.

Adrenals/Urinary Tract: Adrenal glands are normal. Kidneys normal in
size without nephrolithiasis. Subtle symmetric prominence of the
intrarenal collecting systems. Subcentimeter upper pole left renal
cortical hypodensity too small to characterize but likely a cyst.
Ureters and bladder are normal.

Stomach/Bowel: Stomach and small bowel are normal. Appendix is
normal. Colon is normal.

Vascular/Lymphatic: There is minimal calcified plaque over the
abdominal aorta which is normal caliber. No adenopathy.

Reproductive: Normal.

Other: No free fluid or focal inflammatory change.

Musculoskeletal: Minimal degenerate change of the spine with mild
disc space narrowing at the L4-5 level. Mild degenerate change of
the hips right worse than left.
IMPRESSION: 1. No acute findings in the abdomen/pelvis.
2. Subcentimeter upper pole left renal cortical hypodensity too
small to characterize, but likely a cyst.
3. Aortic atherosclerosis.

Aortic Atherosclerosis (316I2-U2L.L).

## 2021-09-13 ENCOUNTER — Encounter: Payer: Self-pay | Admitting: Adult Health

## 2021-09-13 ENCOUNTER — Ambulatory Visit: Payer: Medicaid Other | Admitting: Adult Health

## 2021-09-13 NOTE — Progress Notes (Deleted)
[facility] [address] [place]. [place] [address] [phone]       OFFICE FOLLOW-UP NOTE  [name] Date of Birth:  05-03-1966 Medical Record Number:  [id number]    Reason for visit: Stroke follow-up    No chief complaint on file.    HPI:   Today, 09/13/2021, Zachary Flores returns for 44-month stroke follow-up.  He continues to reside at [facility].  Overall doing well with residual ***.  Currently working with ***. Per MAR review, remains on atorvastatin without side effects.  Blood pressures today ***.       History provided for reference purposes only Update 05/17/2021 JM: Zachary Flores returns for 23-month stroke follow-up accompanied by his daughter  He has since been residing at [facility] due to requiring extensive assistance with care at home and strain on family.  Per daughter, he has been making excellent progress since going to SNF currently receiving PT/OT/SLP with residual left-sided weakness and speech difficulty.  He does report occasional left-sided pains/tightness especially when working with therapies.  He remains nonambulatory but is able to stand/pivot to transfer into wheelchair.  Denies new stroke/TIA symptoms  Remains on atorvastatin without associated side effects Blood pressure today 128/83 on losartan tolerating without side effects Continues to refrain from polysubstance abuse including EtOH, tobacco and cocaine  No further concerns at this time  Initial visit 02/13/2021 Dr. Pearlean Brownie Zachary Flores is a 55 year old African-American male seen today for initial office follow-up visit following hospital admission for intracerebral hemorrhage in September 2021.  Is accompanied by sister.  History is obtained from them and review of electronic medical records and I personally reviewed pertinent imaging films in PACS.  He has past medical history of hypertension and cocaine and alcohol abuse who presented on 09/06/2020 to  [facility] with sudden onset of left-sided weakness which he noticed when he woke up that morning.  He also had some double vision which worsened slightly when he presented.  His blood pressure only mildly elevated 150/99 but CT scan showed a large Large 21 x 14 mm upper brainstem hematoma involving the pons with volume of 2.8 mL.  There was severe chronic small vessel disease changes noted.  CT angiogram of the brain and neck both did not show significant large vessel stenosis or occlusion.  Patient was kept in the ICU and blood pressure tightly monitored.  He remained neurologically stable and follow-up CT scan showed stable appearance of the hemorrhage.  His echocardiogram showed ejection fraction 55 to 60%.  LDL cholesterol was 100 mg percent and hemoglobin A1c was 5.9.  Urine drug screen was positive for cocaine.  Patient was transferred to inpatient rehab where he stayed for 4 weeks and then was eventually transferred to skilled nursing facility in [place] area.  Patient has shown some improvement and is able to speak and swallow is currently living at home with his daughter but is still unable to get up and walk even with assistance and is mostly wheelchair-bound.  He is currently not getting any home physical therapy and family is wondering if he will benefit from outpatient therapy.  His blood pressures well controlled and today it is 124/78.  He has quit drinking alcohol, smoking cigarettes and using cocaine since his hemorrhage.  Patient wants to eventually learn how to walk and live in an independent apartment.  He has no new complaints today.   ROS:   14 system review of systems is positive for  those listed in HPI all other systems negative  PMH:  Past Medical History:  Diagnosis Date   Elevated LFTs 06/30/2019   Hypertension    Pressure injury of skin 09/19/2020   Stroke (HCC)    Thrombocytosis    Urinary retention     Social History:  Social History   Socioeconomic History    Marital status: Divorced    Spouse name: Not on file   Number of children: Not on file   Years of education: Not on file   Highest education level: Not on file  Occupational History   Not on file  Tobacco Use   Smoking status: Former   Smokeless tobacco: Never  Substance and Sexual Activity   Alcohol use: Not Currently    Alcohol/week: 24.0 standard drinks    Types: 24 Cans of beer per week   Drug use: Yes    Types: Cocaine    Comment: quit 08/2020   Sexual activity: Not on file  Other Topics Concern   Not on file  Social History Narrative   Lives with daughter   Right Handed   Drinks no caffeine   Social Determinants of Health   Financial Resource Strain: Not on file  Food Insecurity: Not on file  Transportation Needs: Not on file  Physical Activity: Not on file  Stress: Not on file  Social Connections: Not on file  Intimate Partner Violence: Not on file    Medications:   Current Outpatient Medications on File Prior to Visit  Medication Sig Dispense Refill   acetaminophen (TYLENOL) 325 MG tablet Take 650 mg by mouth every 6 (six) hours as needed.     atorvastatin (LIPITOR) 20 MG tablet Take 1 tablet (20 mg total) by mouth daily. 90 tablet 3   Baclofen 5 MG TABS Take 5 mg by mouth in the morning, at noon, and at bedtime. 90 tablet 5   loperamide (IMODIUM) 2 MG capsule Take 1 capsule (2 mg total) by mouth every 4 (four) hours as needed for diarrhea or loose stools. 30 capsule 0   losartan (COZAAR) 50 MG tablet Take 1 tablet by mouth daily.     magnesium hydroxide (MILK OF MAGNESIA) 400 MG/5ML suspension Take by mouth daily as needed for mild constipation.     valsartan (DIOVAN) 80 MG tablet Take 1 tablet (80 mg total) by mouth daily. (Patient not taking: Reported on 05/17/2021) 90 tablet 1   No current facility-administered medications on file prior to visit.    Allergies:  No Known Allergies  Physical Exam There were no vitals filed for this visit.  There is no  height or weight on file to calculate BMI.    General: well developed, well nourished middle-aged African-American male, seated, in no evident distress Head: head normocephalic and atraumatic.  Neck: supple with no carotid or supraclavicular bruits Cardiovascular: regular rate and rhythm, no murmurs Musculoskeletal: no deformity Skin:  no rash/petichiae Vascular:  Normal pulses all extremities  Neurologic Exam Mental Status: Awake and fully alert. Oriented to place and time. Recent and remote memory intact. Attention span, concentration and fund of knowledge appropriate. Mood and affect appropriate.  Mild dysarthria.  No aphasia. Cranial Nerves: Pupils equal, briskly reactive to light. Extraocular movements full without nystagmus. Visual fields full to confrontation. Hearing intact.  Left lower facial weakness.  Facial sensation intact. Face, tongue, palate moves normally and symmetrically.  Motor: Full strength right upper and lower extremity LUE:4-4+/5 with increased tone throughout LLE: 4+-5-/5 greater distally  with mildly increased tone Sensory.: intact to touch ,pinprick .position and vibratory sensation.  Coordination: Impaired finger-to-nose and heel-to-shin coordination on the left and intact on the right. Gait and Station: patient nonambulatory Reflexes: 2+ and asymmetric and brisker on the left. Toes downgoing.       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       PLAN:  1.  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  Recommend trialing baclofen 5 mg 3 times daily for spasticity interfering with sleep and rehab -Continue atorvastatin 20 mg daily for secondary stroke prevention -Discussed secondary stroke  prevention measures and importance of close PCP follow-up for aggressive stroke risk factor management including HLD with LDL goal<70 and HTN with BP goal<130/90  2.  Polysubstance abuse -Endorses continued abstinence   Follow-up in 4 months or call earlier if needed   CC:  [facility] provider: Dr. Randalyn Rhea, Charlcie Cradle, MD   I spent *** minutes of face-to-face and non-face-to-face time with patient.  This included previsit chart review, lab review, study review, order entry, electronic health record documentation, patient education and discussion regarding history of pontine hemorrhage, secondary stroke prevention measures and aggressive stroke risk factor management, residual deficits and answered all other questions to patient satisfaction   Ihor Austin, AGNP-BC  [facility] [address] [place], [place] [address]  Phone [phone] Fax [phone] Note: This document was prepared with digital dictation and possible smart phrase technology. Any transcriptional errors that result from this process are unintentional.

## 2021-12-15 ENCOUNTER — Encounter: Payer: Self-pay | Admitting: Adult Health

## 2021-12-15 ENCOUNTER — Ambulatory Visit (INDEPENDENT_AMBULATORY_CARE_PROVIDER_SITE_OTHER): Payer: Medicaid Other | Admitting: Adult Health

## 2021-12-15 VITALS — BP 136/81 | HR 68

## 2021-12-15 DIAGNOSIS — I613 Nontraumatic intracerebral hemorrhage in brain stem: Secondary | ICD-10-CM | POA: Diagnosis not present

## 2021-12-15 DIAGNOSIS — G8114 Spastic hemiplegia affecting left nondominant side: Secondary | ICD-10-CM | POA: Diagnosis not present

## 2021-12-15 DIAGNOSIS — F1411 Cocaine abuse, in remission: Secondary | ICD-10-CM | POA: Diagnosis not present

## 2021-12-15 NOTE — Progress Notes (Signed)
[facility] [address] [place]. [place] [address] [phone]       OFFICE FOLLOW-UP NOTE  [name] Date of Birth:  Jul 01, 1966 Medical Record Number:  [id number]    Reason for visit: Stroke follow-up    Chief Complaint  Patient presents with   Follow-up    RM 1 with facility staff angel  Pt is well, things are about the same as last visit. No new concerns      HPI:   Update 12/15/21 JM: Mr Naeem is here today for a stroke follow-up accompanied by Lawanna Kobus, facility staff. Reports stable left-sided weakness and speech difficulty. Denies new stroke/TIA symptoms. He remains nonambulatory and able to stand and pivot. He is still residing at [facility]. PT/OT ongoing. Remains on atorvastatin without side effects. BP today 136/81. Last A1C 5.9 (09/08/20) and LDL 50 (03/01/21). He is refraining from ETOH, tobacco, and drug use. He questions potential further recovery.  No new concerns at this time.   History provided for reference purposes only Update, 05/17/2021, Mr. Pellecchia returns for 57-month stroke follow-up accompanied by his daughter  He has since been residing at [facility] SNF due to requiring extensive assistance with care at home and strain on family.  Per daughter, he has been making excellent progress since going to SNF currently receiving PT/OT/SLP with residual left-sided weakness and speech difficulty.  He does report occasional left-sided pains/tightness especially when working with therapies.  He remains nonambulatory but is able to stand/pivot to transfer into wheelchair.  Denies new stroke/TIA symptoms  Remains on atorvastatin without associated side effects Blood pressure today 128/83 on losartan tolerating without side effects Continues to refrain from polysubstance abuse including EtOH, tobacco and cocaine  No further concerns at this time  Initial visit 02/13/2021 Dr. Pearlean Brownie [name] is a 55 year old African-American  male seen today for initial office follow-up visit following hospital admission for intracerebral hemorrhage in September 2021.  Is accompanied by sister.  History is obtained from them and review of electronic medical records and I personally reviewed pertinent imaging films in PACS.  He has past medical history of hypertension and cocaine and alcohol abuse who presented on 09/06/2020 to [facility] with sudden onset of left-sided weakness which he noticed when he woke up that morning.  He also had some double vision which worsened slightly when he presented.  His blood pressure only mildly elevated 150/99 but CT scan showed a large Large 21 x 14 mm upper brainstem hematoma involving the pons with volume of 2.8 mL.  There was severe chronic small vessel disease changes noted.  CT angiogram of the brain and neck both did not show significant large vessel stenosis or occlusion.  Patient was kept in the ICU and blood pressure tightly monitored.  He remained neurologically stable and follow-up CT scan showed stable appearance of the hemorrhage.  His echocardiogram showed ejection fraction 55 to 60%.  LDL cholesterol was 100 mg percent and hemoglobin A1c was 5.9.  Urine drug screen was positive for cocaine.  Patient was transferred to inpatient rehab where he stayed for 4 weeks and then was eventually transferred to skilled nursing facility in [place] area.  Patient has shown some improvement and is able to speak and swallow is currently living at home with his daughter but is still unable to get up and walk even with assistance and is mostly wheelchair-bound.  He is currently not getting any home physical therapy and family is wondering if  he will benefit from outpatient therapy.  His blood pressures well controlled and today it is 124/78.  He has quit drinking alcohol, smoking cigarettes and using cocaine since his hemorrhage.  Patient wants to eventually learn how to walk and live in an independent  apartment.  He has no new complaints today.   ROS:   14 system review of systems is positive for those listed in HPI all other systems negative  PMH:  Past Medical History:  Diagnosis Date   Elevated LFTs 06/30/2019   Hypertension    Pressure injury of skin 09/19/2020   Stroke (HCC)    Thrombocytosis    Urinary retention     Social History:  Social History   Socioeconomic History   Marital status: Divorced    Spouse name: Not on file   Number of children: Not on file   Years of education: Not on file   Highest education level: Not on file  Occupational History   Not on file  Tobacco Use   Smoking status: Former   Smokeless tobacco: Never  Substance and Sexual Activity   Alcohol use: Not Currently    Alcohol/week: 24.0 standard drinks    Types: 24 Cans of beer per week   Drug use: Yes    Types: Cocaine    Comment: quit 08/2020   Sexual activity: Not on file  Other Topics Concern   Not on file  Social History Narrative   Lives with daughter   Right Handed   Drinks no caffeine   Social Determinants of Health   Financial Resource Strain: Not on file  Food Insecurity: Not on file  Transportation Needs: Not on file  Physical Activity: Not on file  Stress: Not on file  Social Connections: Not on file  Intimate Partner Violence: Not on file    Medications:   Current Outpatient Medications on File Prior to Visit  Medication Sig Dispense Refill   acetaminophen (TYLENOL) 325 MG tablet Take 650 mg by mouth every 6 (six) hours as needed.     atorvastatin (LIPITOR) 20 MG tablet Take 1 tablet (20 mg total) by mouth daily. 90 tablet 3   Baclofen 5 MG TABS Take 5 mg by mouth in the morning, at noon, and at bedtime. 90 tablet 5   loperamide (IMODIUM) 2 MG capsule Take 1 capsule (2 mg total) by mouth every 4 (four) hours as needed for diarrhea or loose stools. 30 capsule 0   losartan (COZAAR) 50 MG tablet Take 1 tablet by mouth daily.     magnesium hydroxide (MILK OF  MAGNESIA) 400 MG/5ML suspension Take by mouth daily as needed for mild constipation.     No current facility-administered medications on file prior to visit.    Allergies:  No Known Allergies  Physical Exam Today's Vitals   12/15/21 1022  BP: 136/81  Pulse: 68    There is no height or weight on file to calculate BMI.   General: well developed, well nourished very pleasant middle-aged African-American male, seated, in no evident distress Head: head normocephalic and atraumatic.  Neck: supple with no carotid or supraclavicular bruits Cardiovascular: regular rate and rhythm, no murmurs Musculoskeletal: no deformity Skin:  no rash/petichiae Vascular:  Normal pulses all extremities  Neurologic Exam Mental Status: Awake and fully alert. Oriented to place and time. Recent and remote memory intact. Attention span, concentration and fund of knowledge appropriate. Mood and affect appropriate.  Mild dysarthria.  No aphasia. Cranial Nerves: Pupils equal, briskly reactive  to light. Extraocular movements full without nystagmus. Visual fields full to confrontation. Hearing intact.  Left lower facial weakness.  Facial sensation intact. Face, tongue, palate moves normally and symmetrically.  Motor: Full strength right upper and lower extremity LUE:4-4+/5 with increased tone throughout LLE: 4+-5-/5 greater distally with mildly increased tone Sensory.: intact to touch ,pinprick .position and vibratory sensation.  Coordination: Impaired finger-to-nose and heel-to-shin coordination on the left and intact on the right. Gait and Station: Deferred -nonambulatory Reflexes: 2+ and asymmetric and brisker on the left. Toes downgoing.       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     PLAN:  1.  Pontine hemorrhage    -residual left spastic hemiparesis and speech difficulty - as stroke over 1 year ago, he will  likely not see any additional recovery - this was further discussed during visit  -continued participation with therapies at SNF more so to prevent worsening spasticity and potential contractures  -Continue atorvastatin 20 mg daily for secondary stroke prevention  -Discussed secondary stroke prevention measures and importance of close PCP follow-up for aggressive stroke risk factor management including HLD with LDL goal<70 and HTN with BP goal<130/90  2.  Polysubstance abuse  -Endorses continued abstinence   Follow-up as needed as overall stable from stroke standpoint   CC:  Storm Frisk, MD    I spent 31 minutes of face-to-face and non-face-to-face time with patient.  This included previsit chart review, lab review, study review, electronic health record documentation, patient education and discussion regarding history of prior stroke with residual deficits, secondary stroke prevention measures and aggressive stroke risk factor management and answered all other questions to patient satisfaction   Ihor Austin, [facility]  [facility] [address] [place], [place] [address]  Phone [phone] Fax [phone] Note: This document was prepared with digital dictation and possible smart phrase technology. Any transcriptional errors that result from this process are unintentional.

## 2021-12-15 NOTE — Patient Instructions (Signed)
No changes today  Continue atorvastatin  for secondary stroke prevention  Continue to follow up with PCP regarding cholesterol and blood pressure management  Maintain strict control of hypertension with blood pressure goal below 130/90, diabetes with hemoglobin A1c goal below 7.0 % and cholesterol with LDL cholesterol (bad cholesterol) goal below 70 mg/dL.   Signs of a Stroke? Follow the BEFAST method:  Balance Watch for a sudden loss of balance, trouble with coordination or vertigo Eyes Is there a sudden loss of vision in one or both eyes? Or double vision?  Face: Ask the person to smile. Does one side of the face droop or is it numb?  Arms: Ask the person to raise both arms. Does one arm drift downward? Is there weakness or numbness of a leg? Speech: Ask the person to repeat a simple phrase. Does the speech sound slurred/strange? Is the person confused ? Time: If you observe any of these signs, call 911.       Thank you for coming to see [place] at [facility]. I hope we have been able to provide you high quality care today.  You may receive a patient satisfaction survey over the next few weeks. We would appreciate your feedback and comments so that we may continue to improve ourselves and the health of our patients.

## 2022-01-05 ENCOUNTER — Telehealth: Payer: Self-pay | Admitting: Adult Health

## 2022-01-05 NOTE — Telephone Encounter (Signed)
Pt's sister(on DPR) left a vm at 3;46 stating she is trying to get ID for pt.  The sister stated that she needs a letter for pt stating that he is in a facility, that he is bed ridden and unable to walk.  Pt's sister is asking for a call on this request.

## 2022-01-05 NOTE — Telephone Encounter (Signed)
PHONE RM CAN RELAY   Contacted sister back, no answer   Pt sister will need to get a letter from the facility stating he is there and his condition. They have doctors and nurses there who can help provide the assistance that she needs.

## 2022-01-11 NOTE — Telephone Encounter (Signed)
Pt's sister called back and the message from [place], [place] was relayed. FYI no call back requested
